# Patient Record
Sex: Female | Born: 1950
Health system: Southern US, Community
[De-identification: ages and names within clinical notes are randomized; demographics above are authoritative.]

## PROBLEM LIST (undated history)

## (undated) DIAGNOSIS — F5104 Psychophysiologic insomnia: Secondary | ICD-10-CM

## (undated) DIAGNOSIS — B029 Zoster without complications: Secondary | ICD-10-CM

## (undated) DIAGNOSIS — K219 Gastro-esophageal reflux disease without esophagitis: Secondary | ICD-10-CM

## (undated) DIAGNOSIS — I219 Acute myocardial infarction, unspecified: Secondary | ICD-10-CM

## (undated) DIAGNOSIS — E785 Hyperlipidemia, unspecified: Secondary | ICD-10-CM

## (undated) DIAGNOSIS — M8580 Other specified disorders of bone density and structure, unspecified site: Secondary | ICD-10-CM

## (undated) DIAGNOSIS — I10 Essential (primary) hypertension: Secondary | ICD-10-CM

## (undated) DIAGNOSIS — H269 Unspecified cataract: Secondary | ICD-10-CM

## (undated) DIAGNOSIS — K635 Polyp of colon: Secondary | ICD-10-CM

## (undated) DIAGNOSIS — K2100 Gastro-esophageal reflux disease with esophagitis, without bleeding: Secondary | ICD-10-CM

## (undated) DIAGNOSIS — F419 Anxiety disorder, unspecified: Secondary | ICD-10-CM

## (undated) DIAGNOSIS — T7840XA Allergy, unspecified, initial encounter: Secondary | ICD-10-CM

## (undated) DIAGNOSIS — F329 Major depressive disorder, single episode, unspecified: Secondary | ICD-10-CM

## (undated) DIAGNOSIS — R011 Cardiac murmur, unspecified: Secondary | ICD-10-CM

## (undated) DIAGNOSIS — F32A Depression, unspecified: Secondary | ICD-10-CM

## (undated) DIAGNOSIS — K611 Rectal abscess: Secondary | ICD-10-CM

## (undated) DIAGNOSIS — I2510 Atherosclerotic heart disease of native coronary artery without angina pectoris: Secondary | ICD-10-CM

## (undated) HISTORY — PX: WISDOM TOOTH EXTRACTION: SHX21

## (undated) HISTORY — DX: Acute myocardial infarction, unspecified: I21.9

## (undated) HISTORY — DX: Rectal abscess: K61.1

## (undated) HISTORY — PX: COLONOSCOPY: SHX174

## (undated) HISTORY — DX: Hyperlipidemia, unspecified: E78.5

## (undated) HISTORY — DX: Unspecified cataract: H26.9

## (undated) HISTORY — DX: Cardiac murmur, unspecified: R01.1

## (undated) HISTORY — DX: Anxiety disorder, unspecified: F41.9

## (undated) HISTORY — PX: POLYPECTOMY: SHX149

## (undated) HISTORY — DX: Depression, unspecified: F32.A

## (undated) HISTORY — DX: Polyp of colon: K63.5

## (undated) HISTORY — PX: OTHER SURGICAL HISTORY: SHX169

## (undated) HISTORY — DX: Essential (primary) hypertension: I10

## (undated) HISTORY — DX: Atherosclerotic heart disease of native coronary artery without angina pectoris: I25.10

## (undated) HISTORY — PX: INCISION AND DRAINAGE ABSCESS ANAL: SUR669

## (undated) HISTORY — DX: Major depressive disorder, single episode, unspecified: F32.9

## (undated) HISTORY — DX: Gastro-esophageal reflux disease with esophagitis: K21.0

## (undated) HISTORY — DX: Other specified disorders of bone density and structure, unspecified site: M85.80

## (undated) HISTORY — DX: Gastro-esophageal reflux disease with esophagitis, without bleeding: K21.00

## (undated) HISTORY — DX: Psychophysiologic insomnia: F51.04

## (undated) HISTORY — DX: Gastro-esophageal reflux disease without esophagitis: K21.9

## (undated) HISTORY — DX: Allergy, unspecified, initial encounter: T78.40XA

## (undated) HISTORY — PX: STOMACH SURGERY: SHX791

---

## 2004-10-03 LAB — HM COLONOSCOPY: HM Colonoscopy: ABNORMAL

## 2005-06-19 HISTORY — PX: COLONOSCOPY W/ POLYPECTOMY: SHX1380

## 2005-08-16 ENCOUNTER — Other Ambulatory Visit: Admission: RE | Admit: 2005-08-16 | Discharge: 2005-08-16 | Payer: Self-pay | Admitting: Obstetrics and Gynecology

## 2005-08-28 ENCOUNTER — Ambulatory Visit: Payer: Self-pay | Admitting: Internal Medicine

## 2005-10-06 ENCOUNTER — Ambulatory Visit: Payer: Self-pay | Admitting: Internal Medicine

## 2005-10-06 ENCOUNTER — Encounter (INDEPENDENT_AMBULATORY_CARE_PROVIDER_SITE_OTHER): Payer: Self-pay | Admitting: Specialist

## 2006-01-10 ENCOUNTER — Ambulatory Visit: Payer: Self-pay | Admitting: Internal Medicine

## 2006-02-21 ENCOUNTER — Ambulatory Visit: Payer: Self-pay | Admitting: Internal Medicine

## 2006-04-18 ENCOUNTER — Ambulatory Visit: Payer: Self-pay | Admitting: Pulmonary Disease

## 2006-09-03 LAB — CONVERTED CEMR LAB: Pap Smear: NORMAL

## 2006-09-04 ENCOUNTER — Ambulatory Visit: Payer: Self-pay | Admitting: Internal Medicine

## 2006-10-22 ENCOUNTER — Ambulatory Visit: Payer: Self-pay | Admitting: Internal Medicine

## 2007-03-11 ENCOUNTER — Ambulatory Visit: Payer: Self-pay | Admitting: Internal Medicine

## 2007-03-12 ENCOUNTER — Encounter: Payer: Self-pay | Admitting: Internal Medicine

## 2007-03-12 LAB — CONVERTED CEMR LAB
Mumps IgG: 2.1 — ABNORMAL HIGH
Rubella: 297.2 intl units/mL — ABNORMAL HIGH
Varicella IgG: 2.48 — ABNORMAL HIGH

## 2007-03-26 ENCOUNTER — Encounter: Payer: Self-pay | Admitting: *Deleted

## 2007-03-26 DIAGNOSIS — Z872 Personal history of diseases of the skin and subcutaneous tissue: Secondary | ICD-10-CM | POA: Insufficient documentation

## 2007-03-26 DIAGNOSIS — F341 Dysthymic disorder: Secondary | ICD-10-CM | POA: Insufficient documentation

## 2007-03-26 DIAGNOSIS — D126 Benign neoplasm of colon, unspecified: Secondary | ICD-10-CM | POA: Insufficient documentation

## 2007-03-26 DIAGNOSIS — I10 Essential (primary) hypertension: Secondary | ICD-10-CM | POA: Insufficient documentation

## 2007-03-26 DIAGNOSIS — G4700 Insomnia, unspecified: Secondary | ICD-10-CM | POA: Insufficient documentation

## 2007-03-27 ENCOUNTER — Ambulatory Visit: Payer: Self-pay | Admitting: Internal Medicine

## 2007-06-20 DIAGNOSIS — I219 Acute myocardial infarction, unspecified: Secondary | ICD-10-CM

## 2007-06-20 HISTORY — DX: Acute myocardial infarction, unspecified: I21.9

## 2007-07-18 ENCOUNTER — Inpatient Hospital Stay (HOSPITAL_COMMUNITY): Admission: EM | Admit: 2007-07-18 | Discharge: 2007-07-22 | Payer: Self-pay | Admitting: Cardiology

## 2007-07-18 ENCOUNTER — Encounter: Payer: Self-pay | Admitting: Internal Medicine

## 2007-07-18 ENCOUNTER — Emergency Department (HOSPITAL_COMMUNITY): Admission: EM | Admit: 2007-07-18 | Discharge: 2007-07-18 | Payer: Self-pay | Admitting: Emergency Medicine

## 2007-07-18 ENCOUNTER — Ambulatory Visit: Payer: Self-pay | Admitting: Cardiology

## 2007-07-22 ENCOUNTER — Encounter: Payer: Self-pay | Admitting: Cardiology

## 2007-07-24 ENCOUNTER — Ambulatory Visit: Payer: Self-pay | Admitting: Internal Medicine

## 2007-07-24 DIAGNOSIS — M79609 Pain in unspecified limb: Secondary | ICD-10-CM

## 2007-07-24 DIAGNOSIS — I2510 Atherosclerotic heart disease of native coronary artery without angina pectoris: Secondary | ICD-10-CM

## 2007-07-24 DIAGNOSIS — K219 Gastro-esophageal reflux disease without esophagitis: Secondary | ICD-10-CM | POA: Insufficient documentation

## 2007-07-24 DIAGNOSIS — E785 Hyperlipidemia, unspecified: Secondary | ICD-10-CM | POA: Insufficient documentation

## 2007-08-02 ENCOUNTER — Ambulatory Visit: Payer: Self-pay | Admitting: Cardiology

## 2007-08-12 ENCOUNTER — Encounter (HOSPITAL_COMMUNITY): Admission: RE | Admit: 2007-08-12 | Discharge: 2007-11-10 | Payer: Self-pay | Admitting: Cardiology

## 2007-08-22 ENCOUNTER — Ambulatory Visit: Payer: Self-pay

## 2007-08-22 ENCOUNTER — Ambulatory Visit: Payer: Self-pay | Admitting: Cardiology

## 2007-08-22 ENCOUNTER — Encounter: Payer: Self-pay | Admitting: Internal Medicine

## 2007-08-22 LAB — CONVERTED CEMR LAB
Albumin: 4.1 g/dL (ref 3.5–5.2)
Bilirubin, Direct: 0.1 mg/dL (ref 0.0–0.3)
HDL: 46.3 mg/dL (ref >39.0)
Total CHOL/HDL Ratio: 3.3
Triglycerides: 124 mg/dL (ref 0–149)
VLDL: 25 mg/dL (ref 0–40)

## 2007-08-30 ENCOUNTER — Ambulatory Visit: Payer: Self-pay | Admitting: Cardiology

## 2007-09-17 ENCOUNTER — Ambulatory Visit: Payer: Self-pay | Admitting: Internal Medicine

## 2007-10-17 ENCOUNTER — Ambulatory Visit: Payer: Self-pay | Admitting: Internal Medicine

## 2007-10-17 LAB — CONVERTED CEMR LAB
AST: 36 units/L (ref 0–37)
Alkaline Phosphatase: 97 units/L (ref 39–117)
BUN: 15 mg/dL (ref 6–23)
Basophils Relative: 0.6 % (ref 0.0–1.0)
Calcium: 8.7 mg/dL (ref 8.4–10.5)
Cholesterol: 155 mg/dL (ref 0–200)
Creatinine, Ser: 0.7 mg/dL (ref 0.4–1.2)
Eosinophils Absolute: 0.2 10*3/uL (ref 0.0–0.7)
Eosinophils Relative: 3.1 % (ref 0.0–5.0)
HCT: 40.5 % (ref 36.0–46.0)
Hgb A1c MFr Bld: 6.1 % — ABNORMAL HIGH (ref 4.6–6.0)
LDL Cholesterol: 89 mg/dL (ref 0–99)
Lymphocytes Relative: 32 % (ref 12.0–46.0)
Monocytes Absolute: 0.5 10*3/uL (ref 0.1–1.0)
Neutro Abs: 3.5 10*3/uL (ref 1.4–7.7)
Neutrophils Relative %: 56.6 % (ref 43.0–77.0)
Platelets: 277 10*3/uL (ref 150–400)
RBC: 4.51 M/uL (ref 3.87–5.11)
Sodium: 139 meq/L (ref 135–145)
Total CHOL/HDL Ratio: 4.3
Total Protein: 6.7 g/dL (ref 6.0–8.3)
Triglycerides: 149 mg/dL (ref 0–149)

## 2007-10-21 ENCOUNTER — Ambulatory Visit: Payer: Self-pay | Admitting: Internal Medicine

## 2007-10-21 ENCOUNTER — Telehealth: Payer: Self-pay | Admitting: Internal Medicine

## 2007-10-21 DIAGNOSIS — E669 Obesity, unspecified: Secondary | ICD-10-CM

## 2007-10-21 DIAGNOSIS — R7309 Other abnormal glucose: Secondary | ICD-10-CM

## 2007-10-24 ENCOUNTER — Encounter: Payer: Self-pay | Admitting: Internal Medicine

## 2007-10-30 ENCOUNTER — Telehealth: Payer: Self-pay | Admitting: Internal Medicine

## 2007-10-31 ENCOUNTER — Ambulatory Visit: Payer: Self-pay | Admitting: Cardiology

## 2007-11-11 ENCOUNTER — Encounter (HOSPITAL_COMMUNITY): Admission: RE | Admit: 2007-11-11 | Discharge: 2007-12-18 | Payer: Self-pay | Admitting: Cardiology

## 2007-11-29 ENCOUNTER — Encounter: Payer: Self-pay | Admitting: Internal Medicine

## 2008-01-13 ENCOUNTER — Telehealth: Payer: Self-pay | Admitting: Internal Medicine

## 2008-01-28 ENCOUNTER — Ambulatory Visit: Payer: Self-pay | Admitting: Internal Medicine

## 2008-01-28 DIAGNOSIS — L639 Alopecia areata, unspecified: Secondary | ICD-10-CM | POA: Insufficient documentation

## 2008-01-28 DIAGNOSIS — L659 Nonscarring hair loss, unspecified: Secondary | ICD-10-CM

## 2008-01-30 ENCOUNTER — Ambulatory Visit: Payer: Self-pay | Admitting: Cardiology

## 2008-02-03 ENCOUNTER — Ambulatory Visit: Payer: Self-pay | Admitting: Internal Medicine

## 2008-02-03 LAB — CONVERTED CEMR LAB
ALT: 18 units/L (ref 0–35)
AST: 21 units/L (ref 0–37)
CO2: 32 meq/L (ref 19–32)
Chloride: 106 meq/L (ref 96–112)
Ferritin: 29.1 ng/mL (ref 10.0–291.0)
Glucose, Bld: 99 mg/dL (ref 70–99)
Hgb A1c MFr Bld: 6 % (ref 4.6–6.0)
LDL Cholesterol: 100 mg/dL — ABNORMAL HIGH (ref 0–99)
Sodium: 142 meq/L (ref 135–145)
Thyroglobulin Ab: <30 (ref 0.0–60.0)
Total CHOL/HDL Ratio: 3.6
VLDL: 22 mg/dL (ref 0–40)

## 2008-02-04 ENCOUNTER — Encounter: Payer: Self-pay | Admitting: Internal Medicine

## 2008-02-26 ENCOUNTER — Ambulatory Visit: Payer: Self-pay | Admitting: Internal Medicine

## 2008-02-26 ENCOUNTER — Telehealth: Payer: Self-pay | Admitting: Internal Medicine

## 2008-02-28 ENCOUNTER — Telehealth: Payer: Self-pay | Admitting: Internal Medicine

## 2008-03-03 ENCOUNTER — Telehealth: Payer: Self-pay | Admitting: Internal Medicine

## 2008-03-09 ENCOUNTER — Ambulatory Visit: Payer: Self-pay | Admitting: Internal Medicine

## 2008-03-09 DIAGNOSIS — R109 Unspecified abdominal pain: Secondary | ICD-10-CM | POA: Insufficient documentation

## 2008-03-09 LAB — CONVERTED CEMR LAB
Nitrite: NEGATIVE
Urobilinogen, UA: 0.2
pH: 5

## 2008-05-21 ENCOUNTER — Ambulatory Visit: Payer: Self-pay

## 2008-05-21 ENCOUNTER — Ambulatory Visit: Payer: Self-pay | Admitting: Cardiology

## 2008-07-07 ENCOUNTER — Ambulatory Visit: Payer: Self-pay | Admitting: Diagnostic Radiology

## 2008-07-07 ENCOUNTER — Ambulatory Visit: Payer: Self-pay | Admitting: Internal Medicine

## 2008-07-07 ENCOUNTER — Ambulatory Visit (HOSPITAL_BASED_OUTPATIENT_CLINIC_OR_DEPARTMENT_OTHER): Admission: RE | Admit: 2008-07-07 | Discharge: 2008-07-07 | Payer: Self-pay | Admitting: Internal Medicine

## 2008-07-07 DIAGNOSIS — M25539 Pain in unspecified wrist: Secondary | ICD-10-CM

## 2008-07-17 ENCOUNTER — Encounter: Payer: Self-pay | Admitting: Internal Medicine

## 2008-11-05 ENCOUNTER — Ambulatory Visit: Payer: Self-pay | Admitting: Cardiology

## 2009-01-12 ENCOUNTER — Ambulatory Visit: Payer: Self-pay | Admitting: Internal Medicine

## 2009-01-12 DIAGNOSIS — N390 Urinary tract infection, site not specified: Secondary | ICD-10-CM

## 2009-01-12 LAB — CONVERTED CEMR LAB
Glucose, Urine, Semiquant: NEGATIVE
Urobilinogen, UA: 0.2
pH: 5

## 2009-01-13 ENCOUNTER — Encounter: Payer: Self-pay | Admitting: Internal Medicine

## 2009-01-13 ENCOUNTER — Telehealth: Payer: Self-pay | Admitting: Internal Medicine

## 2009-01-19 ENCOUNTER — Ambulatory Visit: Payer: Self-pay | Admitting: Internal Medicine

## 2009-01-19 ENCOUNTER — Telehealth: Payer: Self-pay | Admitting: Internal Medicine

## 2009-01-19 ENCOUNTER — Ambulatory Visit (HOSPITAL_BASED_OUTPATIENT_CLINIC_OR_DEPARTMENT_OTHER): Admission: RE | Admit: 2009-01-19 | Discharge: 2009-01-19 | Payer: Self-pay | Admitting: Internal Medicine

## 2009-01-19 ENCOUNTER — Ambulatory Visit: Payer: Self-pay | Admitting: Diagnostic Radiology

## 2009-01-19 DIAGNOSIS — M549 Dorsalgia, unspecified: Secondary | ICD-10-CM | POA: Insufficient documentation

## 2009-01-19 LAB — CONVERTED CEMR LAB
Bilirubin Urine: NEGATIVE
Specific Gravity, Urine: >=1.03
Urobilinogen, UA: 0.2

## 2009-02-10 LAB — CONVERTED CEMR LAB
ALT: 18 units/L (ref 0–35)
Albumin: 4.6 g/dL (ref 3.5–5.2)
Bilirubin, Direct: 0.1 mg/dL (ref 0.0–0.3)
CO2: 25 meq/L (ref 19–32)
Chloride: 105 meq/L (ref 96–112)
Glucose, Bld: 90 mg/dL (ref 70–99)
HDL: 52 mg/dL (ref >39)
LDL Cholesterol: 79 mg/dL (ref 0–99)
Lymphocytes Relative: 40 % (ref 12–46)
MCHC: 31.3 g/dL (ref 30.0–36.0)
MCV: 91.6 fL (ref 78.0–100.0)
Monocytes Absolute: 0.6 10*3/uL (ref 0.1–1.0)
Neutro Abs: 3.5 10*3/uL (ref 1.7–7.7)
Neutrophils Relative %: 49 % (ref 43–77)
Platelets: 281 10*3/uL (ref 150–400)
RDW: 13.8 % (ref 11.5–15.5)
Sodium: 143 meq/L (ref 135–145)
Total Bilirubin: 0.6 mg/dL (ref 0.3–1.2)
Triglycerides: 183 mg/dL — ABNORMAL HIGH (ref <150)
WBC: 7.1 10*3/uL (ref 4.0–10.5)

## 2009-07-08 ENCOUNTER — Ambulatory Visit: Payer: Self-pay | Admitting: Cardiology

## 2009-08-08 ENCOUNTER — Emergency Department (HOSPITAL_COMMUNITY): Admission: EM | Admit: 2009-08-08 | Discharge: 2009-08-08 | Payer: Self-pay | Admitting: Emergency Medicine

## 2009-08-11 ENCOUNTER — Telehealth: Payer: Self-pay | Admitting: Cardiology

## 2010-01-25 ENCOUNTER — Ambulatory Visit: Payer: Self-pay | Admitting: Family

## 2010-01-25 DIAGNOSIS — J069 Acute upper respiratory infection, unspecified: Secondary | ICD-10-CM | POA: Insufficient documentation

## 2010-02-04 ENCOUNTER — Ambulatory Visit: Payer: Self-pay | Admitting: Cardiology

## 2010-02-17 LAB — CONVERTED CEMR LAB
AST: 27 units/L (ref 0–37)
Alkaline Phosphatase: 90 units/L (ref 39–117)
Bilirubin, Direct: 0.1 mg/dL (ref 0.0–0.3)
LDL Cholesterol: 88 mg/dL (ref 0–99)
Total Bilirubin: 0.8 mg/dL (ref 0.3–1.2)
Total CHOL/HDL Ratio: 4
VLDL: 33 mg/dL (ref 0.0–40.0)

## 2010-07-19 NOTE — Assessment & Plan Note (Signed)
Summary: f11m   Visit Type:  6 months follow up Primary Provider:  Dondra Spry DO  CC:  No complains.  History of Present Illness: She is doing pretty well.  Her husband is home from [place] permanently.  She is now in school.  Her husband was forcibly retired.  Patient is currently taking alot of supplements.  She feels quite healthy.    Current Medications (verified): 1)  Tylenol Pm Extra Strength 500-25 Mg  Tabs (Diphenhydramine-Apap (Sleep)) .... Take As Needed For Sleep 2)  Xanax 0.25 Mg  Tabs (Alprazolam) .... Take 1 Tablet By Mouth Once A Day As Needed 3)  Lipitor 80 Mg  Tabs (Atorvastatin Calcium) .... Take 1 Tablet By Mouth Once A Day At Bedtime 4)  Aspirin Low Dose 81 Mg Tabs (Aspirin) .... Take 1 Tablet By Mouth Two Times A Day 5)  Plavix 75 Mg  Tabs (Clopidogrel Bisulfate) .... Take 1 Tablet By Mouth Once A Day 6)  Metoprolol Tartrate 25 Mg  Tabs (Metoprolol Tartrate) .... 1/2 Two Times A Day 7)  Nitroquick 0.4 Mg  Subl (Nitroglycerin) .... As Needed Chest Pain 8)  Calcium-Magnesium-Zinc 500-250-12.5 Mg Tabs (Calcium-Magnesium-Zinc) .... Take 1 Tablet By Mouth Every Other Day 9)  Sm Flax Seed Oil 1000 Mg Caps (Flaxseed (Linseed)) .... 1200mg  Per Day 10)  Fish Oil 1200 Mg Caps (Omega-3 Fatty Acids) .... Take 1 Capsule By Mouth Once A Day 11)  Coq10 200 Mg Caps (Coenzyme Q10) .... Take 1 Capsule By Mouth Once A Day 12)  Biotin 300 Mcg Tabs (Biotin) .... 600mg  Every Other Day 13)  Cinnamon 500 Mg Caps (Cinnamon) .... 1000mg  Per Day 14)  Garlic Oil 500 Mg Tabs (Garlic) .... 1250mg  Per Day 15)  Cramberryjuice Concentrate .... 500mg  Per Day 16)  Super B Complex  Tabs (B Complex-C) .... Take 1 Tablet By Mouth Once A Day 17)  Green Tea 315 Mg Caps (Green Tea (Camillia Sinensis)) .... Take 1 Capsule By Mouth Once A Day 18)  Rhodiola 300 Mg Caps (Rhodiola Rosea) .... 340mg  Per Day 19)  Chromium Picolinate 200 Mcg Tabs (Chromium Picolinate) .... Every Other Day 20)   Calcium-Magnesium-Zinc 750-375-15 Mg Tabs (Calcium-Magnesium-Zinc) .... Every Other Day 21)  Acetyl L-Carnitine 250 Mg Caps (Acetylcarnitine Hcl) .... 400mg  Every Other Day  Allergies: 1)  ! Sulfa 2)  ! Phenergan 3)  ! Codeine 4)  ! Penicillin 5)  ! Hydrocodone 6)  ! Compazine 7)  ! Thorazine 8)  ! Vicodin  Past History:  Past Medical History: Last updated: 01/19/2009 Chronic insomnia History of perirectal abscess History of colon polyp  Anxiety/Depression Hypertension  CAD   Acute MI  -  1/09 Preserved LV function  Hyperlipidemia    Past Surgical History: Last updated: 01/19/2009 CAESAREAN SECTION, HX OF (ICD-V39.01) S/P PTCA with stent to third obtuse marginal       Family History: Last updated: 01/19/2009 Father allergies Mother skin cancer    Social History: Last updated: 01/19/2009 Occupation: student 2 children  Married Former Smoker quit 6-8 months ago (40 pack year history)   SOCIAL HISTORY:  She resides in [place] with her husband.  However,   her husband works in [place] and comes home two to three times a year.   She has two children who are going to school at [employer].  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  She quit smoking in   early 2008.  Prior to that, she was  smoking two packs a day x20 years.   She denies any alcohol, drugs, herbal medications, specific diet or   exercise.        Vital Signs:  Patient profile:   60 year old female Height:      61 inches Weight:      158.50 pounds BMI:     30.06 Pulse rate:   58 / minute Pulse rhythm:   regular Resp:     18 per minute BP sitting:   110 / 80  (left arm) Cuff size:   large  Vitals Entered By: Vikki Ports (February 04, 2010 10:21 AM)  Physical Exam  General:  Well developed, well nourished, in no acute distress. Head:  normocephalic and atraumatic Eyes:  PERRLA/EOM intact; conjunctiva and lids normal. Lungs:  Clear bilaterally to auscultation and  percussion. Heart:  Non-displaced PMI, chest non-tender; regular rate and rhythm, S1, S2 without murmurs, rubs or gallops. Carotid upstroke normal, no bruit.  Pulses:  pulses normal in all 4 extremities Extremities:  No clubbing or cyanosis.  Trace edema. Neurologic:  Alert and oriented x 3.   EKG  Procedure date:  02/04/2010  Findings:      SB.  Otherwise normal.    Impression & Recommendations:  Problem # 1:  C A D (ICD-414.00) Stable at present.  No chest pain.  Husband has moved home, and increased stress.  I suggested that they exercise together.  Follow up one year.   Her updated medication list for this problem includes:    Aspirin Low Dose 81 Mg Tabs (Aspirin) .Marland Kitchen... Take 1 tablet by mouth two times a day    Plavix 75 Mg Tabs (Clopidogrel bisulfate) .Marland Kitchen... Take 1 tablet by mouth once a day    Metoprolol Tartrate 25 Mg Tabs (Metoprolol tartrate) .Marland Kitchen... 1/2 two times a day    Nitroquick 0.4 Mg Subl (Nitroglycerin) .Marland Kitchen... As needed chest pain  Her updated medication list for this problem includes:    Aspirin Low Dose 81 Mg Tabs (Aspirin) .Marland Kitchen... Take 1 tablet by mouth two times a day    Plavix 75 Mg Tabs (Clopidogrel bisulfate) .Marland Kitchen... Take 1 tablet by mouth once a day    Metoprolol Tartrate 25 Mg Tabs (Metoprolol tartrate) .Marland Kitchen... 1/2 two times a day    Nitroquick 0.4 Mg Subl (Nitroglycerin) .Marland Kitchen... As needed chest pain  Orders: EKG w/ Interpretation (93000) TLB-Lipid Panel (80061-LIPID) TLB-Hepatic/Liver Function Pnl (80076-HEPATIC)  Problem # 2:  HYPERLIPIDEMIA (ICD-272.4)  Need to check lipid and liver profile.   Her updated medication list for this problem includes:    Lipitor 80 Mg Tabs (Atorvastatin calcium) .Marland Kitchen... Take 1 tablet by mouth once a day at bedtime  Orders: EKG w/ Interpretation (93000) TLB-Lipid Panel (80061-LIPID) TLB-Hepatic/Liver Function Pnl (80076-HEPATIC)  Her updated medication list for this problem includes:    Lipitor 80 Mg Tabs (Atorvastatin  calcium) .Marland Kitchen... Take 1 tablet by mouth once a day at bedtime  Problem # 3:  Hx of HYPERTENSION, MILD (ICD-401.1) stable.   Her updated medication list for this problem includes:    Aspirin Low Dose 81 Mg Tabs (Aspirin) .Marland Kitchen... Take 1 tablet by mouth two times a day    Metoprolol Tartrate 25 Mg Tabs (Metoprolol tartrate) .Marland Kitchen... 1/2 two times a day  Patient Instructions: 1)  Your physician recommends that you have a FASTING LIPID and LIVER Profile.  2)  Your physician recommends that you continue on your current medications as directed. Please refer to the Current  Medication list given to you today. 3)  Your physician wants you to follow-up in: 1 YEAR.   You will receive a reminder letter in the mail two months in advance. If you don't receive a letter, please call our office to schedule the follow-up appointment. Prescriptions: METOPROLOL TARTRATE 25 MG  TABS (METOPROLOL TARTRATE) 1/2 two times a day  #90 x 3   Entered by:   Julieta Gutting, RN, BSN   Authorized by:   Ronaldo Miyamoto, MD, [facility]   Signed by:   Julieta Gutting, RN, BSN on 02/04/2010   Method used:   Electronically to        [facility] [address] [phone]* (retail)       [address]       [place], [place]  [address]       Ph: [phone] or [phone]       Fax: [phone]   RxID:   [id number] PLAVIX 75 MG  TABS (CLOPIDOGREL BISULFATE) Take 1 tablet by mouth once a day  #90 x 3   Entered by:   Julieta Gutting, RN, BSN   Authorized by:   Ronaldo Miyamoto, MD, [facility]   Signed by:   Julieta Gutting, RN, BSN on 02/04/2010   Method used:   Electronically to        [facility] [address] [phone]* (retail)       [address]       [place], [place]  [address]       Ph: [phone] or [phone]       Fax: [phone]   RxID:   [id number] LIPITOR 80 MG  TABS (ATORVASTATIN CALCIUM) Take 1 tablet by mouth once a day at bedtime  #90 x 3   Entered by:   Julieta Gutting, RN,  BSN   Authorized by:   Ronaldo Miyamoto, MD, [facility]   Signed by:   Julieta Gutting, RN, BSN on 02/04/2010   Method used:   Electronically to        [facility] [address] [phone]* (retail)       [address]       [place], [place]  [address]       Ph: [phone] or [phone]       Fax: [phone]   RxID:   [id number]

## 2010-07-19 NOTE — Assessment & Plan Note (Signed)
Summary: Emily [name]   Visit Type:  6 months follow up Primary Provider:  Dondra Spry DO  CC:  No complains.  History of Present Illness: Pt is still a sophomore at college at [employer].  Her major interest is in piano.  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  She is enjoying it.  She gets tudoring.  She has had not had any chest pain, and feels really quite heallthy.  She had a case of vertigo, and a couple of UTI's.  Had recommended d/c of clopidogrel last year, but she wanted to continue, and is still nervous about coming off.  Results of the Charisma trial were discussed with the patient.  She wants to continue given her residual disease, and after discussion about subgroup analysis.  Now taking alot of vitamins. f Current Medications (verified): 1)  Tylenol Pm Extra Strength 500-25 Mg  Tabs (Diphenhydramine-Apap (Sleep)) .... Take As Needed For Sleep 2)  Xanax 0.25 Mg  Tabs (Alprazolam) .... Take 1 Tablet By Mouth Once A Day As Needed 3)  Lipitor 80 Mg  Tabs (Atorvastatin Calcium) .... Take 1 Tablet By Mouth Once A Day At Bedtime 4)  Aspirin Low Dose 81 Mg Tabs (Aspirin) .... Take 1 Tablet By Mouth Two Times A Day 5)  Plavix 75 Mg  Tabs (Clopidogrel Bisulfate) .... Take 1 Tablet By Mouth Once A Day 6)  Metoprolol Tartrate 25 Mg  Tabs (Metoprolol Tartrate) .... 1/2 Two Times A Day 7)  Nitroquick 0.4 Mg  Subl (Nitroglycerin) .... As Needed Chest Pain 8)  Calcium-Magnesium-Zinc 500-250-12.5 Mg Tabs (Calcium-Magnesium-Zinc) .... Take 1 Tablet By Mouth Once A Day 9)  Sm Flax Seed Oil 1000 Mg Caps (Flaxseed (Linseed)) .... Take 1 Capsule By Mouth Once A Day 10)  Fish Oil 1200 Mg Caps (Omega-3 Fatty Acids) .... Take 1 Capsule By Mouth Once A Day 11)  Coq10 200 Mg Caps (Coenzyme Q10) .... Take 1 Capsule By Mouth Once A Day 12)  Cvs Green Tea Extract 250 Mg Caps (Green Tea (Camillia Sinensis)) .... Once Daily 13)  Biotin 300 Mcg Tabs (Biotin) .... Take 1 Tablet By Mouth Once A Day 14)  Rhodiola 300 Mg Caps  (Rhodiola Rosea) .... Take 1 Capsule By Mouth Once A Day 15)  Cinnamon 500 Mg Caps (Cinnamon) .... Take 1 Capsule By Mouth Once A Day 16)  B-50 Formula  Tabs (Multiple Vitamins-Minerals) .... Take 1 Tablet By Mouth Once A Day 17)  Osteo Bi-Flex Adv Double St  Tabs (Misc Natural Products) .... Take 1 Tablet By Mouth Once A Day 18)  Mulberry Tablet .... Take 1 Tablet By Mouth Once A Day 19)  Garlic Oil 500 Mg Tabs (Garlic) .... Take 1 Tablet By Mouth Once A Day  Allergies: 1)  ! Sulfa 2)  ! Phenergan 3)  ! Codeine 4)  ! Penicillin 5)  ! Hydrocodone 6)  ! Compazine 7)  ! Thorazine 8)  ! Vicodin  Vital Signs:  Patient profile:   60 year old female Height:      61 inches Weight:      152.75 pounds BMI:     28.97 Pulse rate:   81 / minute Pulse rhythm:   regular Resp:     18 per minute BP sitting:   128 / 82  (left arm) Cuff size:   regular  Vitals Entered By: Vikki Ports (July 08, 2009 2:27 PM)  Physical Exam  General:  Well developed, well nourished, in no acute distress.  Lungs:  Clear bilaterally to auscultation and percussion. Heart:  Non-displaced PMI, chest non-tender; regular rate and rhythm, S1, S2 without murmurs, rubs or gallops. Extremities:  No clubbing or cyanosis. Neurologic:  Alert and oriented x 3.   EKG  Procedure date:  07/08/2009  Findings:      Normal rhythm.  WNL.  Impression & Recommendations:  Problem # 1:  C A D (ICD-414.00)  Stable at present.  Continuing medications.  Wants to continue clopidogrel.  No bleeding. Charisma discussed.  updated medication list for this problem includes:    Aspirin Low Dose 81 Mg Tabs (Aspirin) .Marland Kitchen... Take 1 tablet by mouth two times a day    Plavix 75 Mg Tabs (Clopidogrel bisulfate) .Marland Kitchen... Take 1 tablet by mouth once a day    Metoprolol Tartrate 25 Mg Tabs (Metoprolol tartrate) .Marland Kitchen... 1/2 two times a day    Nitroquick 0.4 Mg Subl (Nitroglycerin) .Marland Kitchen... As needed chest pain  Orders: EKG w/ Interpretation  (93000)  Problem # 2:  Hx of HYPERTENSION, MILD (ICD-401.1) stable. Her updated medication list for this problem includes:    Aspirin Low Dose 81 Mg Tabs (Aspirin) .Marland Kitchen... Take 1 tablet by mouth two times a day    Metoprolol Tartrate 25 Mg Tabs (Metoprolol tartrate) .Marland Kitchen... 1/2 two times a day  Problem # 3:  HYPERLIPIDEMIA (ICD-272.4)  Numbers ok.  Continue medication.  Recheck numbers. Her updated medication list for this problem includes:    Lipitor 80 Mg Tabs (Atorvastatin calcium) .Marland Kitchen... Take 1 tablet by mouth once a day at bedtime  Orders: EKG w/ Interpretation (93000)  Patient Instructions: 1)  Your physician recommends that you return for a FASTING LIPID and LIVER Profile (414.01, 272.0, v58.69)  2)  Your physician wants you to follow-up in:   6 MONTHS. You will receive a reminder letter in the mail two months in advance. If you don't receive a letter, please call our office to schedule the follow-up appointment. 3)  Your physician recommends that you continue on your current medications as directed. Please refer to the Current Medication list given to you today.

## 2010-07-19 NOTE — Progress Notes (Signed)
Summary: PT WAS IN ED WITH CHEST PAIN  Phone Note Call from Patient Call back at Home Phone [phone]   Caller: Patient Reason for Call: Talk to Nurse, Talk to Doctor Summary of Call: PT WAS IN ER SUNDAY WITH CHEST PAIN AND EVERYTHING WAS NORMAL EXCEPT B/P WAS REALLY HIGH PT HAS ONLY ABOUT 45 MIN PLEASE GIVE HER A CALL. 6:30 this am 146/92, 11:30 today 158/98 took meds at 7:30  Initial call taken by: Omer Jack,  August 11, 2009 11:29 AM  Follow-up for Phone Call        I spoke with the pt and she has not had any further CP since Sunday.  I reviewed the pt's ER records and everything looked okay (labs, x-ray).  The pt felt like she got over anxious about the pain which she rated as 1/10 in the ER.  The pt's BP in the ER was 150/83 and 150/82.  Today the pt checked her BP at 6:30 and it was 146/92.  The pt took her medications at  7:30.  The pt just came home from school and her pressure at  11:30 was 158/98.  The pt said she has been under a lot of stress at school and has been anxious.  I told the pt that these factors can effect BP.  I asked the pt to check her BP a few times a week and call the office back in 10 days if her BP is consistently above 140/90.  Pt agrees with plan.   Follow-up by: Julieta Gutting, RN, BSN,  August 11, 2009 12:19 PM     Appended Document: PT WAS IN ED WITH CHEST PAIN Reviewed with L. Manson Passey.  Agree with plan.

## 2010-07-19 NOTE — Assessment & Plan Note (Signed)
Summary: SINUS PROBLEM/ST/HEA--Rm 4   Vital Signs:  Patient profile:   60 year old female Height:      61 inches Weight:      158 pounds BMI:     29.96 O2 Sat:      97 % on Room air Temp:     98.1 degrees F oral Pulse rate:   78 / minute Pulse rhythm:   regular Resp:     18 per minute BP sitting:   112 / 74  (right arm) Cuff size:   regular  Vitals Entered By: Emily [name] CMA Duncan Dull) (January 25, 2010 11:05 AM)  O2 Flow:  Room air CC: Room 4  Sore throat, hoarse, dry cough & low grade fever x 1 day. Is Patient Diabetic? No Comments Pt states she only takes Biotin every other day. All other med doses and directions are correct.  Emily [name] CMA Duncan Dull)  January 25, 2010 11:10 AM    Primary Care Provider:  Dondra Spry DO  CC:  Room 4  Sore throat, hoarse, and dry cough & low grade fever x 1 day.Marland Kitchen  History of Present Illness: Emily [name] is a 60 year old female who presents with complaint of AM sore throat.  Gets better after she has been up for a little while.  Notes husband has had a cough for 3 weeks.  Notes associated sore throat, low grade fever, voice hoarseness, cough, and  nasal congestion.   Mild nasal congestion- no significant drainage.  Cough is dry.  She expresses concern that "I always end up with Bronchitis."  She also reports that her husband has recently moved back from [place] where he has lived for the last 5 years.  He recently retired as well.  They are adjusting to living together again which pt reports is very challenging.  She is requesting a referral to a local therapist who provides marriage counseling.  Allergies: 1)  ! Sulfa 2)  ! Phenergan 3)  ! Codeine 4)  ! Penicillin 5)  ! Hydrocodone 6)  ! Compazine 7)  ! Thorazine 8)  ! Vicodin  Past History:  Past Medical History: Last updated: 01/19/2009 Chronic insomnia History of perirectal abscess History of colon polyp  Anxiety/Depression Hypertension  CAD   Acute MI  -   1/09 Preserved LV function  Hyperlipidemia    Past Surgical History: Last updated: 01/19/2009 CAESAREAN SECTION, HX OF (ICD-V39.01) S/P PTCA with stent to third obtuse marginal       Family History: Last updated: 01/19/2009 Father allergies Mother skin cancer    Social History: Last updated: 01/19/2009 Occupation: student 2 children  Married Former Smoker quit 6-8 months ago (40 pack year history)   SOCIAL HISTORY:  She resides in [place] with her husband.  However,   her husband works in [place] and comes home two to three times a year.   She has two children who are going to school at [employer].  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  She quit smoking in   early 2008.  Prior to that, she was smoking two packs a day x20 years.   She denies any alcohol, drugs, herbal medications, specific diet or   exercise.        Risk Factors: Smoking Status: quit (07/24/2007)  Physical Exam  General:  Well-developed,well-nourished,in no acute distress; alert,appropriate and cooperative throughout examination Head:  Normocephalic and atraumatic without obvious abnormalities. No apparent alopecia  or balding. Eyes:  PERRLA Ears:  External ear exam shows no significant lesions or deformities.  Otoscopic examination reveals clear canals, tympanic membranes are intact bilaterally without bulging, retraction, inflammation or discharge. Hearing is grossly normal bilaterally. Mouth:  + pharyngeal erythema noted Neck:  No deformities, masses, or tenderness noted. Lungs:  Normal respiratory effort, chest expands symmetrically. Lungs are clear to auscultation, no crackles or wheezes. Heart:  Normal rate and regular rhythm. S1 and S2 normal without gallop, murmur, click, rub or other extra sounds.   Impression & Recommendations:  Problem # 1:  URI (ICD-465.9) Assessment New Rapid strep is negative.  Will add Zyrtec as needed to help with any allergic component.  Pt was  given rx for empiric zithromax given reported history of recurrent bronchitis.  She was instructed to call if symptoms worsen or do not improve. Her updated medication list for this problem includes:    Aspirin Low Dose 81 Mg Tabs (Aspirin) .Marland Kitchen... Take 1 tablet by mouth two times a day  Problem # 2:  OTHER FAMILY DISRUPTION (ICD-V61.09) Assessment: New Will refer to marriage counseling.  Orders: Psychology Referral (Psychology)  Complete Medication List: 1)  Tylenol Pm Extra Strength 500-25 Mg Tabs (Diphenhydramine-apap (sleep)) .... Take as needed for sleep 2)  Xanax 0.25 Mg Tabs (Alprazolam) .... Take 1 tablet by mouth once a day as needed 3)  Lipitor 80 Mg Tabs (Atorvastatin calcium) .... Take 1 tablet by mouth once a day at bedtime 4)  Aspirin Low Dose 81 Mg Tabs (Aspirin) .... Take 1 tablet by mouth two times a day 5)  Plavix 75 Mg Tabs (Clopidogrel bisulfate) .... Take 1 tablet by mouth once a day 6)  Metoprolol Tartrate 25 Mg Tabs (Metoprolol tartrate) .... 1/2 two times a day 7)  Nitroquick 0.4 Mg Subl (Nitroglycerin) .... As needed chest pain 8)  Calcium-magnesium-zinc 500-250-12.5 Mg Tabs (Calcium-magnesium-zinc) .... Take 1 tablet by mouth once a day 9)  Sm Flax Seed Oil 1000 Mg Caps (Flaxseed (linseed)) .... Take 1 capsule by mouth once a day 10)  Fish Oil 1200 Mg Caps (Omega-3 fatty acids) .... Take 1 capsule by mouth once a day 11)  Coq10 200 Mg Caps (Coenzyme q10) .... Take 1 capsule by mouth once a day 12)  Cvs Green Tea Extract 250 Mg Caps (Green tea (camillia sinensis)) .... Once daily 13)  Biotin 300 Mcg Tabs (Biotin) .... Take 1 tablet by mouth once every other day 14)  Rhodiola 300 Mg Caps (Rhodiola rosea) .... Take 1 capsule by mouth once a day 15)  Cinnamon 500 Mg Caps (Cinnamon) .... Take 1 capsule by mouth once a day 16)  B-50 Formula Tabs (Multiple vitamins-minerals) .... Take 1 tablet by mouth once a day 17)  Osteo Bi-flex Adv Double St Tabs (Misc natural  products) .... Take 1 tablet by mouth once a day 18)  Mulberry Tablet  .... Take 1 tablet by mouth once a day 19)  Garlic Oil 500 Mg Tabs (Garlic) .... Take 1 tablet by mouth once a day 20)  Zithromax 250 Mg Tabs (Azithromycin) .... 2 tabs by mouth today, then one tablet by mouth daily x 4 more days  Other Orders: Rapid Strep ([address])  Patient Instructions: 1)  Try adding zyrtec 10 mg by mouth once daily. Prescriptions: ZITHROMAX 250 MG TABS (AZITHROMYCIN) 2 tabs by mouth today, then one tablet by mouth daily x 4 more days  #1 pack x 0   Entered and Authorized by:  Lemont Fillers FNP   Signed by:   Lemont Fillers FNP on 01/25/2010   Method used:   Electronically to        [facility] [phone]* (retail)       [address]       [place], [place]  [address]       Ph: [phone] or [phone]       Fax: [phone]   RxID:   [id number]   Current Allergies (reviewed today): ! SULFA ! PHENERGAN ! CODEINE ! PENICILLIN ! HYDROCODONE ! COMPAZINE ! THORAZINE ! VICODIN  Laboratory Results  Date/Time Received: 01/25/10 Date/Time Reported: Emily [name] CMA (AAMA)  January 25, 2010 11:25 AM   Other Tests  Rapid Strep: negative  Kit Test Internal QC: Positive   (Normal Range: Negative)

## 2010-09-01 ENCOUNTER — Telehealth: Payer: Self-pay | Admitting: Internal Medicine

## 2010-09-01 DIAGNOSIS — H9190 Unspecified hearing loss, unspecified ear: Secondary | ICD-10-CM | POA: Insufficient documentation

## 2010-09-07 LAB — CK TOTAL AND CKMB (NOT AT ARMC)
Relative Index: INVALID (ref 0.0–2.5)
Total CK: 81 U/L (ref 7–177)

## 2010-09-07 LAB — TROPONIN I: Troponin I: <0.01 ng/mL (ref 0.00–0.06)

## 2010-09-07 LAB — BASIC METABOLIC PANEL
BUN: 16 mg/dL (ref 6–23)
CO2: 30 mEq/L (ref 19–32)
Calcium: 9.3 mg/dL (ref 8.4–10.5)
Chloride: 108 mEq/L (ref 96–112)
Creatinine, Ser: 0.71 mg/dL (ref 0.4–1.2)
GFR calc non Af Amer: >60 mL/min (ref >60)

## 2010-09-07 LAB — CBC
RDW: 13.8 % (ref 11.5–15.5)
WBC: 7.9 10*3/uL (ref 4.0–10.5)

## 2010-09-15 NOTE — Progress Notes (Signed)
Summary: referral for additional hearing test  Phone Note Call from Patient Call back at Home Phone [phone]   Caller: Patient Call For: [name] Summary of Call: Pt had hearing test at [facility]. Results were moderate to severe hearing loss. Pt would like you to refer her to doctor for additional testing. please advise. Initial call taken by: Elba Barman,  September 01, 2010 10:49 AM  Follow-up for Phone Call        Appt  Dr Dorma Russell   March  21  @  2:40pm  Follow-up by: Darral Dash,  September 06, 2010 2:34 PM  New Problems: UNSPECIFIED HEARING LOSS (ICD-389.9)   New Problems: UNSPECIFIED HEARING LOSS (ICD-389.9)

## 2010-09-30 ENCOUNTER — Encounter: Payer: Self-pay | Admitting: Internal Medicine

## 2010-11-01 NOTE — Assessment & Plan Note (Signed)
[facility]ALTHC[facility]                            CARDIOLOGY OFFICE NOTE   NAME:Emily [name]                 MRN:          [id number]  DATE:08/02/2007                            DOB:          1950/12/25    [name] is in for a follow-up office visit.  In general, she is  getting along reasonably well.  Her husband has returned from [place], but  is getting ready to leave.  She has a desire to return to schooling  although she is concerned that she may need to drop out of a piano  class.  I think she probably will do well with this, however.  She  presented to [facility] emergency room with some ongoing chest  discomfort.  She has stopped smoking previously.  At the  catheterization, she had well-preserved LV function with an ejection  fraction in excess of 55%.  The circumflex was a dominant vessel.  There  was a first marginal had about 50% ostial segment narrowing.  Second  marginal came out at a slightly different angle but also had 40%  narrowing.  There was an occluded third marginal with a ruptured plaque.  This ended just prior to the bifurcation distally.  This was felt be the  cause of her problems.  She underwent successful stenting using 2.5 x 18  Multilink Vision stent with post dilatation of 2.75.  During the  hospitalization, her renal function and hematocrit were normal.  Her  peak CPKs were beginning with CK total of 146, and MB of 189.  Her lipid  profile during admission revealed an LDL of 147.  Her TSH at this time  was completely normal.  She had some lower extremity trace edema and the  Dopplers at that time were negative.  Chest x-ray was unremarkable.  As  mentioned since discharge from the hospital she has gotten along  reasonably well.   Her drug allergies include SULFA, PHENERGAN, PENICILLIN, CODEINE, and  VICODIN.   Her medications include aspirin 3/25 mg daily, Plavix 75 mg daily,  Lipitor 80 mg q.h.s. and Lopressor 50  mg 1/2 tablet b.i.d.   PHYSICAL EXAMINATION:  Today she is alert and oriented and in no  distress.  The pulse 59 the blood pressure 128/76 and weight is 146 pounds.  Her lung fields are clear to auscultation and percussion.  Her cardiac rhythm is regular.  EXTREMITIES:  Reveal no edema.   Electrocardiogram demonstrates normal sinus rhythm.  There is minor  nonspecific T-wave abnormality with flattened T-waves in the inferior  leads and lateral leads, particularly.   IMPRESSION:  1. Coronary artery disease status post acute myocardial infarction due      to ruptured plaque in the distal circumflex with successful      percutaneous stenting using a non drug-eluting stent.  2. Hypercholesterolemia.  3. Former smoker.  4. Moderate anxiety about the above.   RECOMMENDATIONS:  1. I would recommend cardiac rehabilitation as I think it would be      helpful for her confidence level.  2. I would continue her on the current medical regimen.  She has been      instructed to stay on aspirin and Plavix for minimum of 1 month,      and preferably we would treat her for 1 year.  After 6 months her      aspirin can be reduced to 81 mg.  Refraining from smoking would      also be important.  3. She will be lipid and liver profile.  4. She will need follow-up with Dr. Thomos Lemons.     Arturo Morton. [name], MD, [facility]  Electronically Signed    TDS/MedQ  DD: 08/05/2007  DT: 08/05/2007  Job #: [id number]

## 2010-11-01 NOTE — H&P (Signed)
[name], [name]          ACCOUNT NO.:  [id number]   MEDICAL RECORD NO.:  [id number]          PATIENT TYPE:  INP   LOCATION:  2901                         FACILITY:  [facility]   PHYSICIAN:  Arturo Morton. [name], MD, FACCDATE OF BIRTH:  [birth year]   DATE OF ADMISSION:  07/18/2007  DATE OF DISCHARGE:                              HISTORY & PHYSICAL   PRIMARY CARE PHYSICIAN:  Barbette Hair. Artist Pais, DO.  Cardiologist, Dr. [name].   HISTORY OF PRESENT ILLNESS:  Ms. [name] is a 60 year old, white  female who presented to [facility] complaining of chest discomfort.  She was transferred emergently via [facility] directly to the  catheterization lab secondary to acute myocardial infarction.  Since 10  p.m. last night, the patient has noticed an anterior chest heaviness  radiating into her back.  She denied associated shortness of breath,  vomiting or diaphoresis.  She did have some slight nausea.  She gave it  a 6/10.  She did try to take some Tylenol PM without relief.  She went  to bed around 11 p.m. or 11:30 p.m. and spent a restless night with her  discomfort at about a 3/10.  Since this morning, due to her continued  discomfort, she attributed it to a severe anxiety attack and she called  her primary care physician.  Given her chest discomfort, she was  referred to [facility] Emergency Room.  On arrival to [facility]  Emergency Room, her blood pressure was 170/80, pulse 68, respirations  18, 99% saturations on room air.  Her EKG showed inferior Q waves, but  inferior ST-segment elevation.  She was transferred emergently to the  catheterization lab.   ALLERGIES:  SULFA, PENICILLIN, PHENERGAN, CODEINE, HYDROCODONE,  COMPAZINE AND THORAZINE.  She states that anything that his given for  nausea, she tends not to react well.   MEDICATIONS:  She is not on any prescription medications, however, she  does have a Nicoderm patch on, unknown dosage.   PAST MEDICAL HISTORY:  1. Insomnia and  anxiety for which she is followed by Dr. Shelle Iron.  2. History of hypertension and hyperlipidemia, however, these have      never been treated.   PAST SURGICAL HISTORY:  1. C-section in 1987, and 1988.  2. Rectal fistula repair.   SOCIAL HISTORY:  She resides in [place] with her husband.  However,  her husband works in [place] and comes home two to three times a year.  She has two children who are going to school at [employer].  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  She quit smoking in  early 2008.  Prior to that, she was smoking two packs a day x20 years.  She denies any alcohol, drugs, herbal medications, specific diet or  exercise.   FAMILY HISTORY:  Unobtainable at this time due to acute situation.   REVIEW OF SYSTEMS:  In addition to the above, notable for occasional use  of glasses, postmenopausal and anxiety.  All other systems are  unremarkable.   PHYSICAL EXAMINATION:  VITAL SIGNS:  In the catheterization lab, blood  pressure is  156/94, pulse 90 and regular, respirations 20, 99%  saturations on 2 L.  GENERAL:  She is a well-developed, well-nourished, pleasant, white  female in no apparent distress.  HEENT:  Unremarkable.  NECK:  Supple without thyromegaly, adenopathy, JVD or bruits.  CHEST:  Symmetrical excursion.  Clear to auscultation anterolaterally.  HEART:  PMI is not displaced.  Regular rate and rhythm.  I did not  appreciate any murmurs, rubs, clicks or gallops.  All pulses are  symmetrical and intact.  I did not appreciate abdominal of femoral  bruits.  SKIN:  Intact.  ABDOMEN:  Slightly round.  Bowel sounds present without organomegaly,  masses or tenderness.  EXTREMITIES:  No clubbing, cyanosis or edema.  MUSCULOSKELETAL:  Unremarkable.  NEUROLOGIC:  Unremarkable.   LABORATORY DATA AND X-RAY FINDINGS:  Chest x-ray is pending.  EKG as  previously described at [facility].   H&H 14.3 and 41.6, normal indices, platelets 334, WBC 9.4.   Sodium 139,  potassium 3.5, BUN 13, creatinine 0.77, glucose 118.  PTT 30, PT 12.6.  Point of care markers showed a troponin of 0.7, myoglobin 357.   IMPRESSION:  1. Acute inferior myocardial infarction, late presentation, based on Q      waves inferiorly.  However, with continued discomfort and ST-      segment elevation requires urgent catheterization.  2. Hypertension.  3. Anxiety.  4. Remote tobacco use.   PLAN:  Urgent catheterization by Dr. Shawnie Pons.  The patient will  be admitted to the CCU.  Future recommendations will be based on  findings.  We will start our usual medications such as aspirin, Lipitor  and Lopressor.      Emily Rued, PA-C      Arturo Morton. [name], MD, [facility]  Electronically Signed    EW/MEDQ  D:  07/18/2007  T:  07/18/2007  Job:  [id number]   cc:   Barbette Hair. Artist Pais, DO

## 2010-11-01 NOTE — Cardiovascular Report (Signed)
Emily [name], Emily [name]          ACCOUNT NO.:  [id number]   MEDICAL RECORD NO.:  [id number]          PATIENT TYPE:  INP   LOCATION:  2901                         FACILITY:  [facility]   PHYSICIAN:  Arturo Morton. [name], MD, FACCDATE OF BIRTH:  [birth year]   DATE OF PROCEDURE:  07/18/2007  DATE OF DISCHARGE:                            CARDIAC CATHETERIZATION   INDICATIONS:  Emily [name] is a 60 year old who presented to the  emergency room with ongoing chest pain.  She stopped smoking about 6-8  months ago.  She now presents with an acute inferior infarction.  She  was seen in the ER by the emergency room staff, and Dr. Excell Seltzer  contacted.  She was transferred to the [facility]  ER for urgent intervention.   PROCEDURES:  1. Left heart catheterization.  2. Selective coronary arteriography.  3. Selective left ventriculography.  4. Percutaneous stenting of the obtuse marginal.   DESCRIPTION OF PROCEDURE:  The patient was brought to the  catheterization laboratory, prepped and draped in usual fashion.  Through an anterior puncture, the femoral artery was entered.  A 6-  French sheath was placed.  Prior to the procedure, we had discussed the  need for urgent catheterization to her.  She agreed to proceed.  The  patient had been heparinized, an ACT was checked.  She had also received  aspirin and clopidogrel in the emergency room at [facility].  We then did diagnostic views of the left and right coronary arteries.  The patient had evidence of an occluded marginal branch.  Preparations  were then made for percutaneous intervention.  Bivalirudin was given  according to protocol.  Follow-up ACT obtained.  We used a JL-3.5  guiding catheter and a Prowater wire.  We were able to cross the lesion  and dilate with a 2.25 x 15 Maverick balloon.  Following doing this, we  then dilated the artery and we carefully measured and reviewed the area  for stenting.  Given the  acute MI nature, we elected to place a nondrug-  eluting platform.  A 2.5 x 18 Multilink Vision stent was then placed  down to the lesion.  This was then dilated up to 13 atmospheres.  We did  not go any higher than this, largely because of the tapering of the  distal vessel.  Eighty percent of the top portion of the stent was then  postdilated using a 2.75 Quantum Maverick balloon.  This was taken up to  12 and 14 atmospheres throughout the whole proximal aspect of the stent.  There was no evidence of edge tear and there was marked improvement in  appearance of the artery.  Ultimately, the guidewire was pulled back.  We used this is an anchor to try to better engage the left anterior  descending artery to get visualization.  Following this, all catheters  were subsequently removed, pigtail catheter was then placed in the LV  and ventriculography done in the RAO projection.  There were no  complications.  The femoral sheath was then sewn into place and she was  taken to the holding area in  satisfactory clinical condition.  We timed  discontinuation of the bivalirudin to the administration of the  clopidogrel which had been done in the emergency room at [facility].   HEMODYNAMIC DATA:  1. Central aortic pressure was 106/72.  2. Left atrial pressure was 102/7.  3. There was no gradient pullback across aortic valve.   ANGIOGRAPHIC DATA:  1. Ventriculography in the RAO projection revealed really pretty      vigorous global systolic function.  Ejection fraction was felt to      be in excess of 55%.  2. The left main is free of critical disease, it is a short, nearly      separate, ostia.  3. The LAD courses to the apex.  There is a 50-70% area of segmental      plaquing between the origins of the first and second diagonal.      After the second diagonal, there is about 30% narrowing.  The      distal LAD wraps the apex.  4. The circumflex proper is a dominant vessel.  There is  a first      marginal that has probably about segmental ostial 50% narrowing.      The second marginal comes out at a slightly different angle, but      also has 40% narrowing at its origin.  Following a tiny marginal,      then there is a third major marginal that is basically totally      occluded.  There is a ruptured plaque.  The ruptured plaque ends      just proximal to a bifurcation distally.  Following stenting the      100% is reduced to 0% with a nice angiographic lumen.  TIMI-3 flow      is restored from TIMI-0 flow.  There was some spasm in the distal      vessels which is improved after all wire removal.  The AV      circumflex provides a PDA that is without critical narrowing.  5. The right coronary is a nondominant vessel and free of critical      disease.   CONCLUSION:  1. Acute inferior wall myocardial infarction due to occlusion of the      major third marginal with successful reperfusion using a nondrug-      eluting stent platform.  2. Preserved overall left ventricular function.  3. Scattered coronary disease as noted above.   DISPOSITION:  1. Aggressive risk factor reduction.  2. Cardiac rehabilitation.  3. Continue efforts to stop smoking.      Arturo Morton. [name], MD, [facility]  Electronically Signed     TDS/MEDQ  D:  07/18/2007  T:  07/18/2007  Job:  [id number]   cc:   CV Lab  Barbette Hair. Artist Pais, DO

## 2010-11-01 NOTE — Assessment & Plan Note (Signed)
[facility]                                 ON-CALL NOTE   NAME:Fread, MONTEEN TOOPS                 MRN:          [id number]  DATE:08/19/2007                            DOB:          [birth year]    BRIEF HISTORY:  Ms. Tokar is a 60 year old female who was recently  discharged from the hospital on February 2 and followed up with Dr.  [name] in the office February 13 post MI where she was treated with  bare metal stenting to a ruptured plaque in the distal circumflex.  Since discharge she states that she has been doing well and has a stress  Myoview this Thursday.  This afternoon, she began noticing vague  feelings in her chest that she could not describe.  They were  unassociated with activity, deep breathing, twisting, turning or  tenderness.  She stated that they would last 5-10 seconds and then go  away.  She cannot discern any pattern.  She did state she tried taking  nitroglycerin around 5:00 p.m.  However, she is not clear if that helped  or not.  She is adamant that it does not resemble her recent symptoms of  a myocardial infarction.  She states that if she had not had these heart  problems, she with have attributed it to an anxiety reaction.  However,  she states that she has not been particularly stressed today.  She did  not participate in cardiac rehab due to the weather conditions today.  Although she states that she is very anxious and worried about the  upcoming stress test on Thursday.   I reassured Mrs. Mcneice that the discomfort that she was describing  the above is probably not related to her heart.  However, if she wishes  [place] to evaluate her,  her only alternative would be to come to [facility]  emergency room for evaluation.  She states that she has been monitoring  her blood pressure and this seems to be well controlled.  I also  explained to her that another option would be to relax, if she continued  to have further  indescribable chest symptoms that they began to last  longer or resemble her symptoms of her prior myocardial infarction, she  should call us back and then proceed to the nearest emergency room for  evaluation.  She elected to proceed with the latter option and stated  she would call back if she had any difficulties.     [name], PA-C  Electronically Signed    EW/MedQ  DD: 08/19/2007  DT: 08/19/2007  Job #: [id number]

## 2010-11-01 NOTE — Letter (Signed)
Oct 31, 2007    Barbette Hair. [place], DO  [address] [place], [place] [address]   RE:  [name], [name]  MRN:  [id number]  /  DOB:  1950/10/31   Dear [name],   I had the pleasure of seeing Emily [name] in the office, today, in a followup  visit.  As you know, she underwent emergent percutaneous intervention of  the circumflex coronary artery with a bare metal stent.  She is on dual  antiplatelet therapy.  She has been tolerating all of these reasonably  well.  She stopped smoking nearly a year ago.  Her biggest complaint has  been weight gain.  She was also told by your office that she had a  mildly elevated sugar, and in reviewing the laboratory studies my  assumption is that this is related to the hemoglobin A1c of 6.1%.  She  and I discussed diet for nearly 1/2 hour.   Her medications include:  Aspirin 325 mg daily, Plavix 75 mg daily,  Lipitor 80 mg daily, and Lopressor 25 mg 1/2 tablet b.i.d.   On physical she is alert and oriented in no distress.  The blood  pressure is 120/80, the pulse is 72.  The weight is 153 pounds.  The  lung fields are clear to auscultation and percussion.  The PMI is  nondisplaced.  There is a normal first and second heart sound without a  murmur, rub, or gallop.   Review of the laboratory studies revealed a TSH of 2.0.  Her hemoglobin  was 13.8.  HDL cholesterol was 36.4 and the LDL was 89.  Her glucose was  normal at 85, and a hemoglobin A1c of 6.1%.   In summary, this nice woman has evidence of coronary artery disease.  She has had a myocardial infarction, but has stopped smoking.  Her LDL  remained somewhat elevated on a fairly high dose of atorvastatin.  We  discussed diet in some detail.  At 153 she could stand loose up to about  20 pounds.  This would improve her hemoglobin A1c, and most likely  improve her  serum LDL cholesterol.  Based on these findings, we will not make any  more recommendations at the present time.  We will have her return to  see [place] in followup in cardiology in approximately 3 months.  If she has  any problems in the interim, please do not hesitate to let me know; and  I would be more than happy to see her at any time.    Sincerely,      Arturo Morton. [name], MD, [facility]  Electronically Signed    TDS/MedQ  DD: 10/31/2007  DT: 10/31/2007  Job #: [id number]

## 2010-11-01 NOTE — Discharge Summary (Signed)
Emily [name], [place]          ACCOUNT NO.:  [id number]   MEDICAL RECORD NO.:  [id number]          PATIENT TYPE:  INP   LOCATION:  3740                         FACILITY:  [facility]   PHYSICIAN:  Arturo Morton. [name], MD, FACCDATE OF BIRTH:  [birth year]   DATE OF ADMISSION:  07/18/2007  DATE OF DISCHARGE:  07/22/2007                               DISCHARGE SUMMARY   PRIMARY CARDIOLOGIST:  [name] [name], MD, [facility]   PRIMARY CARE PHYSICIAN:  [name]   PROCEDURES PERFORMED DURING HOSPITALIZATION:  1. Left heart catheterization performed by [name] on      07/18/2007.  (A)  Colon occlusion of the major third marginal.  (B)  Successful reperfusion using nondrug-alluding stent platform.  (C)  Preserved overall left ventricular function.  (D) Scattered coronary disease.  1. Lower extremity Doppler studies secondary to calf pain found to be      negative.   FINAL DISCHARGE DIAGNOSES:  1. Acute inferior myocardial infarction.  (A)  Status post PCI to the third obtuse marginal using a nondrug-  alluding stent.  1. Hypertension.  2. Hyperlipidemia.  3. Insomnia.  4. Anxiety.   HOSPITAL COURSE:  This is a 60 year old Caucasian female who presented  to [facility] complaining of chest discomfort.  Since 10 p.m.  the night before admission, she noticed anterior chest heaviness  radiating to her back.  She has denied any associated shortness of  breath, vomiting or diaphoresis.  She did have some mild nausea.  The  patient took some Tylenol p.m. without relief.  She went to bed around  11:00 p.m. or 11:30.  Had a restless night prior to admission.  The  following morning, she continued to have chest discomfort, and she  attributed it to anxiety but called her primary care physician and  expressed her symptoms.  He advised her to go to the emergency room at  which time she was seen at [facility] ER.  She was found to be  hypertensive with a blood pressure of 170/80,  a pulse of 68.  Her EKG  showed inferior Q-waves but inferior ST elevation as well, and she was  transferred emergently to [facility] for catheterization.   The patient was seen and examined by [name], physician assistant,  and was taken emergently to cardiac catheterization lab where she was  seen also by [name] and underwent emergent cardiac  catheterization.  Cardiac catheterization revealed a 100% occluded third  OM which was reduced to 0% status post multilink multivision stent  placed at 2.5 x 18 mm.  The patient had simultaneous relief of chest  discomfort once this was completed.  The patient also was shown to have  left main free of critical disease, but it is short with a nearly  separate ostia.  The LAD had 50 to 70% area of segmental plaquing  between the origins of the first and second diagonal.  After the second  diagonal, there was 30% narrowing.  The distal LAD wrapped around the  apex.  The circumflex was dominant.  The first marginal had 50%  narrowing.  The second marginal had a 40% narrowing at its origin.  It  was also found that the patient had a ruptured plaque just proximal to  the bifurcation distally and third major marginal.  The right coronary  artery was found to be nondominant and free                   of  clinical disease.   The patient recovered well post catheterization and PCI with no  recurrence of chest discomfort.  The patient did not have any  arrhythmias or new changes in her EKG.  It was found that the patient  was mildly hypotensive after procedure, and her metoprolol was adjusted.  The patient also was found to have hyperlipidemia per lab work with an  elevated cholesterol of 212.  Her triglycerides were 146.  Her LDL was  147, and her HDL was 36.  The patient was subsequently placed on Lipitor  80 mg p.o. q. Day.   The patient was seen and examined and monitored over the weekend by Dr.  Bonnee Quin and [name] and was stable.  She did have some mild  anxiety during the hospitalization, but it was treated with antianxiety  medication.  The patient on the day of discharge was feeling good.  She  had no complaints of chest pain, but she did have some complaints of  right cath discomfort.  A subsequent Doppler study was completed of her  lower extremities and was found to show no obvious evidence of DVT  bilaterally, and the patient was found to be stable to go home with  followup appointment with Dr. [name].  The patient was advised not to  drive for two weeks.  It was also noted the patient was not placed on an  ACE inhibitor secondary to hypotension.   DISCHARGE LABS:  As stated cholesterol 212, lipids 146, HDL 36, LDL 147.  Sodium 140, potassium 3.8, chloride 105, CO2 29, glucose 108, BUN 13,  creatinine 0.82.  Hemoglobin 12.4, hematocrit 36.2, white blood cells  7.8, platelets 245.  TSH 2.224.  Troponin final was 15.77.  Prior to  that it was 28.78 and 47.50 respectively.  EKG on discharge revealed  normal sinus rhythm with inferior Q-waves noted.   VITAL SIGNS ON DISCHARGE:  Blood pressure 102/72, pulse 68, respirations  18, temperature 96.1.   DISCHARGE MEDICATIONS:  1. Aspirin 325 mg daily.  2. Plavix 75 mg daily.  3. Lipitor 80 mg at bedtime.  4. Lopressor 12.5 mg twice a day.  5. Nitroglycerin 0.4 mg p.r.n. chest discomfort.  The addition of ACE      inhibitor will be discussed by Dr. [name] on followup visit.      Xanax 0.25 mg q 4 hours p.r.n.Marland Kitchen   ALLERGIES:  PENICILLIN, PHENERGAN, COMPAZINE, SULFA, AND THORAZINE.   FOLLOWUP PLANS AND APPOINTMENTS:  1. The patient will see [name] on February 13 at 4:15 p.m.  2. The patient has been given post cardiac catheterization      instructions with particular emphasis on the right groin site for      evidence of bleeding, hematoma, signs of infection or severe pain.  3. The patient is to follow up with Dr. Cathie Hoops, primary  care physician      Once she is discharge, she is to make that appointment.  4. The patient has been given prescriptions for her new medications      with  advisement to be compliant and to bring medications to      followup appointment.   TIME SPENT WITH THE PATIENT:  Physician time:  45 minutes.      Bettey Mare. Lyman Bishop, NP      Arturo Morton. [name], MD, [facility]  Electronically Signed    KML/MEDQ  D:  07/22/2007  T:  07/22/2007  Job:  [id number]   cc:   Landry Corporal Dr.

## 2010-11-01 NOTE — Assessment & Plan Note (Signed)
[facility]                            CARDIOLOGY OFFICE NOTE   NAME:Emily [name], Emily [name]                 MRN:          [id number]  DATE:01/30/2008                            DOB:          1951/06/17    Ms. Meller is in for followup today.  She is accompanied by her  husband.  Since I last saw her, she seems to be getting along well.  She  finished her semester without major difficulties.  She has not been  smoking.  She seems to be in good spirits.  She denies specific chest  pain.   She brought in a log today of both her diet, and everything else she is  taking.   Her current medications include aspirin 325 mg daily, Plavix 75 mg  daily, Lipitor 80 mg daily, Lopressor 25 mg 1/2 tablet p.o. b.i.d.,  Flaxseed oil 1200 mg daily, fish oil 1200 mg daily, Cal Mag Zinc 1  daily, Chromium picolinate 1 daily, garlic daily, beef daily, CoQ10  daily, and green tea daily.   On physical, she is alert and oriented in no distress.  Blood pressure  is 124/64, pulse 47.  The lung fields are clear and the cardiac rhythm  is regular.   Electrocardiogram demonstrates normal sinus rhythm/sinus bradycardia and  is otherwise within normal limits.   IMPRESSION:  Coronary artery disease, status post percutaneous coronary  intervention for acute myocardial infarction.   RECOMMENDATIONS:  1. She can decrease her aspirin to 81 mg daily.  2. I would have her decrease her decrease her Lopressor to 1/4 tablet      b.i.d.  3. Repeat stress test will be recommended in approximately 3 months.  4. Continued followup with Dr. Cathie Hoops.  5. Continued encouragement of good habits.     Arturo Morton. [name], MD, [facility]  Electronically Signed    TDS/MedQ  DD: 01/31/2008  DT: 01/31/2008  Job #: [id number]

## 2010-11-01 NOTE — Procedures (Signed)
[facility]                              EXERCISE TREADMILL   NAME:[name], Emily Halsted                     MRN:          [id number]  DATE:05/21/2008                            DOB:          1950/10/26    Duration of exercise 9 minutes.  Maximum heart rate 139.  Percent of  PMHR is 85%.   COMMENTS:  [name] exercised today on the Bruce protocol.  She completed  9 minutes.  She experienced no chest pain.  Test was terminated due to  volitional fatigue.  The blood pressure rose appropriately.  Heart rate  rose up to 85% of age predicted maximum without ischemic change.  Resting electrocardiogram demonstrates normal sinus rhythm at maximum  stress, no significant exercise-induced chest pain or significant ST  depression is noted.   CONCLUSIONS:  1. Exercise tolerance test demonstrating relatively good exercise      tolerance.  No exercise-induced chest pain or ST depression.  2. Prior percutaneous intervention of the circumflex in the setting of      an acute inferior wall myocardial infarction.  The treated vessel      was the distal circumflex.  There was some mild distal spasm which      improved after wire removal.   PLAN:  Continue current medical regimen except for reduction in aspirin.  The patient has not stopped her aspirin has been recommended on the  previous office visit.  I had suggested cutting the aspirin to 81 mg and  also had discussed reducing her Lopressor to one fourth b.i.d. because  of hair loss, but she will, but she has not done this.  She wants to  remain on the same medical regimen except for the aspirin dose.  She can  stop her Plavix at the end of January.  We will see her in early  February and revise her medical regimen at that time.  She did not want  a second drug such as an ACE inhibitor.     Arturo Morton. [name], MD, [facility]  Electronically Signed    TDS/MedQ  DD: 05/21/2008  DT: 05/22/2008  Job #: [id number]

## 2010-11-01 NOTE — Assessment & Plan Note (Signed)
[facility]                            CARDIOLOGY OFFICE NOTE   NAME:Emily [name], Emily [name]                 MRN:          [id number]  DATE:08/30/2007                            DOB:          [birth year]    Emily [name] is in for follow-up.  She has had quite honestly a very  rare episode of chest discomfort.  It has not been in any way steady or  recurrent.  She has, otherwise, remained stable.  She would like to take  on a full schedule of work next fall with her classes.   CURRENT MEDICATIONS:  1. Aspirin 325 mg daily.  2. Plavix 75 mg daily.  3. Lipitor 80 mg daily.  4. Lopressor 50 mg 1/2 tablet p.o. b.i.d.   The patient currently appears to be stable.  She underwent radionuclide  imaging on the 5th.  She had a heart rate of 150 and no significant ST  depression.  Perfusion imaging demonstrated only a small territory of  the inferolateral segment.  This suggested the area of infarct but no  ischemia.   PHYSICAL EXAMINATION:  VITAL SIGNS:  Today blood pressure is 125/74,  pulse 73.  LUNGS:  Lung fields are clear.  CARDIAC:  Cardiac rhythm is regular.   Overall this patient appears to be doing well.  A diagnostic cardiac  catheterization at the time of her intervention she had a 50-70% LAD  stenosis between the origins of the first and second diagonal.  The  right coronary artery was nondominant.  The ventriculogram demonstrated  a significantly large wall motion abnormality.   PLAN:  1. Continued dual antiplatelet therapy.  2. Continue lipid lowering agents and get a lipid and liver profile.  3. Return to clinic in 2-3 months.  4. Reassurance.  5. 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     Arturo Morton. [name], MD, [facility]  Electronically Signed    TDS/MedQ  DD: 09/02/2007  DT: 09/02/2007  Job #: [id number]

## 2010-11-04 NOTE — Assessment & Plan Note (Signed)
[place] [facility]                               PULMONARY OFFICE NOTE   NAME:Emily [name], Emily [name]                   MRN:          [id number]  DATE:04/18/2006                            DOB:          03-31-1951    HISTORY OF PRESENT ILLNESS:  The patient is a 60 year old white female who  comes in today for evaluation of insomnia.  The patient states that a few  years ago she lived in [place] and when she moved back to the [place]  to [place], she began to have difficulties with her sleep.  She then  moved to [place] for three years and then has now been in [place] for  one year.  The patient states that she has trouble both getting to and also  staying asleep.  She typically goes to bed about 10 p.m. and unfortunately  watches TV in bed.  She will typically fall asleep between 12 and 12:30 but  unfortunately she leaves the TV on all night.  The patient states that she  has a fear of the dark and has had this for many, many years.  She has  actually been sleeping with the TV on her whole life with the sound turned  off.  The patient will frequently awaken and if she cannot return to sleep  she will channel surf.  This occurs many times during the night.  Whenever  she arises she feels that she has not slept well and feels tired but is not  able to sleep during the day or take naps.  She gets up to start her day at  approximately 8 a.m.  The patient admits that she is scared of going to  sleep because of the dark and also thinks that anxiety is playing a major  role here.  She has resorted to taking Tylenol PM as well as Xanax and also  drinking champagne at times.  She does have a history of snoring but denies  choking arousals and no one has ever witnessed pauses in her breathing  during sleep.  In terms of her caffeine intake during the day she drinks one  Coke in the morning but no coffee or tea ever.   PAST MEDICAL HISTORY:  1.  Significant for hypertension.  2. History of dyslipidemia.  3. History of allergic rhinitis, otherwise noncontributory.   MEDICATIONS:  She takes no medications on a regular basis except Flonase  p.r.n.  She also takes Xanax and Tylenol PM p.r.n. as stated above.   ALLERGIES:  1. The patient is allergic to PENICILLIN.  2. CODEINE.   SOCIAL HISTORY:  She is married and has children.  She has a history of  smoking for 25 years and currently is at 1-1/2 packs per day.   FAMILY HISTORY:  Remarkable for her father having allergies and her mother  having had skin cancer, otherwise noncontributory.   REVIEW OF SYSTEMS:  As per history of present illness.  Also see patient  intake form documented in the chart.   PHYSICAL EXAMINATION:  GENERAL:  In general she is a well-developed  white  female in no acute distress.  VITAL SIGNS:  Blood pressure 124/82, pulse 69, temperature 98.6, weight is  134 pounds, O2 saturation on room air was 94%.  HEENT:  Pupils equal, round, and reactive to light and accommodation.  Extraocular muscles are intact.  Nares are somewhat narrow but patent.  Oropharynx does show moderate elongation of soft palate and uvula.  NECK:  Supple without JVD or lymphadenopathy.  There is no palpable  thyromegaly.  CHEST:  Totally clear.  CARDIAC:  Reveals regular rate and rhythm.  No murmurs, rubs, or gallops.  ABDOMEN:  Soft, nontender, with good bowel sounds.  GENITAL/RECTAL/BREASTS:  Exams not done, not indicated.  EXTREMITIES:  Lower extremities are without edema.  Good pulses distally.  There is no calf tenderness.  NEUROLOGIC:  Alert and oriented with no obvious motor deficits.   IMPRESSION:  Psychophysiologic insomnia.  I suspect some of her issues  started with her long distance travels between large numbers of time zones  and now has been exacerbated by her significant anxiety about being alone in  the dark and also poor sleep hygiene.  The patient has been  sleeping with  the TV on for years, and has been using benzodiazepines, over the counter  sleep aids, as well as alcohol to help her with sleep.  I have had a long  discussion with her about the behavioral changes needed in order to try and  resolve this.  I will also try to help her get her __________ rhythm back on  track with Rozerem and also give a short trial of Trazodone to help with  sleep onset and possibly an element of depression here.  I have gone over  stimulus control with her about not staying in the bed if she is not able to  get to sleep.  I have asked her to go out to her family room and watch TV  under low lights or read.  She immediately became teary-eyed and said that  she was fearful about being in the family room at night with no one else  home.  Clearly she may benefit from psychologic/psychiatric evaluation.   PLAN:  1. I have discussed stimulus control with the patient and she will give it      her best try if she can overcome her fear.  2. Rozerem 8 mg q.h.s. along with Trazodone 50 mg q.h.s.  3. I have told the patient to drink no alcohol after 8 p.m.  4. I have asked the patient to turn off the TV and do not watch it in any      shape or form while she is in bed.  5. I have gone over various sleep hygiene issues with her.  6. Consider checking thyroid functions at next visit if she is continuing      to have problems.  7. If the patient is continuing to do poorly, I would recommend      psychologic/psychiatric evaluation.  The patient will follow up in 3-4      weeks or sooner if there are problems.    ______________________________  Barbaraann Share, MD,FCCP    [facility]/MedQ  DD: 04/18/2006  DT: 04/19/2006  Job #: [id number]

## 2010-11-04 NOTE — Assessment & Plan Note (Signed)
[place] [facility]                               PULMONARY OFFICE NOTE   NAME:Emily [name], Emily [name]                 MRN:          [id number]  DATE:02/21/2006                            DOB:          March 21, 1951    PULMONARY EXTENDED FOLLOWUP OFFICE VISIT   HISTORY:  60 year old white female active smoker, whose main complaint is  that she never got over bronchitis with a sensation of persistent nasal  and chest congestion with mostly a dry, hacking cough that is worse when  she lies down and early in the mornings.  She denies any purulent sputum  production, fevers, chills, sweats, orthopnea, PND, leg swelling or  significant dyspnea over baseline.   Medications reviewed with the patient in detail indicate that she does use  Flonase on a p.r.n. basis.  Unfortunately, she uses Tylenol P.M. on a  nightly basis and has done so for years.   PHYSICAL EXAMINATION:  GENERAL:  She is a pleasant white female with a very  marked nasal tone to her voice and a congested-sounding cough.  ENT:  She has an oropharynx that reveals minimal cobbling stoning. Moderate  turbinate edema.  Ear canals clear bilaterally.  LUNGS:  Lung fields reveal minimal junky rhonchi bilaterally worse with FVC  maneuver.  HEART:  There is a regular rhythm without murmur, gallop or rub.  ABDOMEN:  Soft and benign.  EXTREMITIES:  Warm without calf tenderness, cyanosis, clubbing or edema.   PFTs were reviewed with the patient and indicate surprisingly that she has  an FEV-1 of 113% predicted with a ratio of 71% and a diffusion capacity that  is also 86% predicted.  There is only a very subtle reduction in mid flow  rate suggested by the flow volume contouring.   Impression is amenable chronic obstructive pulmonary disease despite  longstanding smoking history with marked and disproportionate effect of  mucociliary function.  I suspect this would also be a major risk factor for  chronic  sinusitis and therefore I have recommended the following:  1. Absolutely she should commit to and follow through on smoking      cessation.  The fact that she did it before with patches would      indicate she can do it again and if she cannot do it on her own with      patches then she is willing to set a quit date.  I have offered to see      her back here for counseling with the use of Chantix for 7 days prior      to quit date.  2. To improve mucociliary function, I have recommended she continue      Mucinex 2 b.i.d. p.r.n.  3. I also spent extra time educating her regarding the issue of nasal      hygiene and recommended that she switch her Flonase to 2 puffs b.i.d.      with Afrin for the first 5 days and then taper the Flonase down to 1      puff at bedtime rather than stop it, as long as the  underlying cause of      the inflammation, cigarettes, remains an issue.  4. She also complained of insomnia and I did recommend Xanax 0.5 mg to be      taken p.r.n. but      only gave her a 30-day supply and also reviewed with her optimal sleep      hygiene and emphasized the importance of avoiding Tylenol P.M. which      contains Benadryl.                                   Emily [name]. Emily Sires, MD, [facility]   MBW/MedQ  DD:  02/21/2006  DT:  02/22/2006  Job #:  [id number]

## 2010-11-04 NOTE — Assessment & Plan Note (Signed)
[facility]                           PRIMARY CARE OFFICE NOTE   NAME:Emily [name], Emily [name]                 MRN:          [id number]  DATE:09/04/2006                            DOB:          Jun 18, 1951    HISTORY AND PHYSICAL   CHIEF COMPLAINT:  New patient to practice.   HISTORY OF PRESENT ILLNESS:  The patient is a 60 year old white female  here to establish primary care.  She was formerly followed by Dr. Darnelle Catalan  of [facility].   PAST MEDICAL HISTORY:  Significant for tobacco abuse.  She underwent a  pulmonary function testing in September of 2007.  It did not reveal  significant chronic obstructive lung disease.  She continues to smoke up  to 2 packs per day.  She has been smoking on and off since age 89.  She  has tried to use patches in the past with some success, but returned to  smoking.  She has heard about Chantix before.  Her sister apparently had  some issues with vivid dreams.  In general, she relates apprehension  about long term prescription drug use.  She is unsure whether she is  ready to consider starting Chantix therapy.   She has seen Dr. Shelle Iron of [facility] regarding chronic  insomnia.  She frequently watches TV in bed, falling asleep 1 to 2 in  the morning.  She has issues with some anxiety at night.  She lives  alone.  Her husband is still working in [place] and visits her 2 to 3  times per year.  She has 2 children who are older and are students at  [employer].  She does have a history of using valium in the past, and  states she had a problem with dependence.  Currently, she takes p.r.n.  Xanax on an infrequent basis once or twice per week.  At the end of Dr.  Teddy Spike evaluation, he recommended trial of Rozerem combined with  trazodone.  Again, she was afraid of potential side effects and never  started this therapy.  She has been unable to change her sleep hygiene  patterns, such as  watching TV in bed.  She states that watching TV is  somewhat comforting and makes her feel less alone.   She was noted to have elevated blood pressure readings in the past and  high cholesterol.  She relates both of these things to taking inhaled  steroids.  Apparently Dr. Darnelle Catalan has performed recent blood work and  cholesterol readings are relatively normal.   She denies any history of heart disease.  No history of stroke.  Denies  any history of chest pain.  No symptoms of claudication.   PAST MEDICAL HISTORY SUMMARY:  1. Ongoing tobacco abuse.  2. History of anxiety/depression.  3. History of elevated blood pressure readings.  4. History of high cholesterol.  5. Colon polyps.  6. C-section in 1987.  C-section in 1988.  7. Perirectal abscess in the 1970s.   CURRENT MEDICATIONS:  Flonase twice a day.  Xanax unknown dose p.r.n.  Tylenol P.M. p.r.n.   ALLERGIES TO  MEDICATIONS:  SULFA, PHENERGAN, CODEINE, PENICILLIN,  HYDROCODONE, which causes vomiting and nausea.  She also has intolerance  to COMPAZINE and THORAZINE.   SOCIAL HISTORY:  She is married, currently a homemaker.  She previously  lived in [place] and also in [place].   FAMILY HISTORY:  Notable for a stroke and grandparents known to have  diabetes.   HABITS:  She drinks seldom.  Tobacco use as noted above.   PREVENTATIVE CARE HISTORY:  Last mammogram performed this year in 2008.  Last colonoscopy in 2007.  Last Pap smear and pelvic in 2007.   REVIEW OF SYSTEMS:  No fevers or chills.  She has had recent rhinitis,  triggered by perfume.  Denies any chest pain.  Occasional cough.  No  heartburn, nausea or vomiting, constipation, diarrhea.  No dark stools  or blood in her stool.  No dysuria, frequency, or urgency.  All other  systems negative.   PHYSICAL EXAM:  VITAL SIGNS:  Weight is 136 pounds, temperature 97.6,  pulse 88, BP 138/80 in the left arm in a seated position.  GENERAL:  The patient is a pleasant  well-developed, well-nourished 49-  year-old white female who appears slightly older than stated age.  HEENT:  Normocephalic, atraumatic.  Pupils equal, round, and reactive to  light bilaterally.  Extraocular motility intact.  The patient was  anicteric.  Conjunctivae within normal limits.  External auditory canals  and tympanic membranes were clear bilaterally.  Oropharyngeal exam was  unremarkable.  NECK:  Supple.  No adenopathy, cardioid bruit, or thyromegaly.  CHEST:  Normal respiratory effort.  Clear to auscultation bilaterally.  No rhonchi, rales, or wheezing.  CARDIOVASCULAR:  Regular rate and rhythm.  No significant murmurs, rubs,  or gallops appreciated.  ABDOMEN:  Soft and nontender.  Positive bowel sounds.  Unable to  appreciate any organomegaly.  MUSCULOSKELETAL:  No cyanosis, clubbing, or edema.  SKIN:  Warm and dry.  NEUROLOGIC:  Cranial nerves 2-12 are grossly intact.  The patient had a  frequent tearfulness during our interview and exam.   IMPRESSION/RECOMMENDATIONS:  1. Ongoing tobacco abuse.  2. Chronic insomnia.  3. Generalized anxiety disorder.  4. Elevated blood pressure without diagnosis of hypertension.  5. History of hyperlipidemia.  6. Health maintenance.   RECOMMENDATIONS:  The patient defers trial of Chantix.  She will  investigate medication on her own before starting.  She also declines  referral to a psychiatrist regarding general anxiety disorder and  insomnia, and also defers potential start of SSRI.   We will try to obtain previous records from Dr. Darnelle Catalan, as she was sent  for lipids, BMET, liver function tests, and TSH.   I encouraged the patient to use alprazolam sparingly, especially with a  history of dependence issues with diazepam.   In terms of her blood pressure, she will monitor at least 10 outpatient  readings and follow up with me in approximately 6 weeks.    Barbette Hair. Artist Pais, DO  Electronically Signed    RDY/MedQ  DD: 09/04/2006   DT: 09/04/2006  Job #: [id number]

## 2010-11-04 NOTE — Letter (Signed)
May 04, 2008    Mrs. Emily [name]. 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   Dr. Randon Goldsmith.  Professor of Sales promotion account executive  [employer].   [name], [name]  MRN:  [id number]  /  DOB:  01/11/1951   To whom it may concern:   [name] is a patient under my care.  It would be in the best  interest of her health to drop her piano course for this semester.  It  has resulted, according to her in significant high blood pressure right  after class.  This would be my recommendation.    Sincerely,      Arturo Morton. [name], MD, [facility]  Electronically Signed    TDS/MedQ  DD: 05/04/2008  DT: 05/05/2008  Job #: [id number]

## 2010-11-04 NOTE — Assessment & Plan Note (Signed)
[facility]                               PULMONARY OFFICE NOTE   NAME:Emily [name]                 MRN:          [id number]  DATE:01/10/2006                            DOB:          04/29/1951    This is a pulmonary new patient evaluation.   CHIEF COMPLAINT:  Cough.   HISTORY:  60 year old white female, active smoker, with a history of  recurrent bronchitis since the age of 89, for which she has previously  received multiple short-term courses of inhalers and antibiotics, and  shots (sometimes including Rocephin in the office apparently), but over  the last year had actually done well until she took 3 recent trips, 1 to  [place], 1 to [place], and 1 to [place], all by car.  She abruptly  became ill after returning to [facility] by care on July 22, with a hacking,  dry cough, worse when she lies down at night, better with Phenergan cough  syrup, generalized chest discomfort, and coughing so hard she thought she  might vomit, with a fever of 101.  She was seen by her local doctor, started  on Biaxin, and was not better within 24 hours, and therefore was switched to  Levaquin and with ProAir added at that point.  She lost confidence in her  response, and comes in today, aggravated that she is not better.  She  actually is feeling a little bit better today than she was yesterday.  She  denies any sore throat, does have an ongoing cough with minimal production,  no more vomiting, no overt reflux symptoms or sinus complaints, myalgias or  arthralgias, chest pain or dyspnea.   PAST MEDICAL HISTORY:  Significant for hypertension, not on ACE inhibitors,  hyperlipidemia and chronic sinus problems.  She also has been documented  with mitral valve regurgitation.   ALLERGIES:  She states she is intolerant of PENICILLIN, CODEINE, PHENERGAN,  COMPAZINE, HYDROCODONE and SULFA.   MEDICATIONS:  None regularly, but presently is on both Levaquin  and ProAir.   SOCIAL HISTORY:  She continues to smoke a half a pack per day, denies any  significant alcohol use or unusual travel or hobby exposure, (see comments  regarding recent travel).  She had been living in [place] for a year through  December of 2006.   FAMILY HISTORY:  Significant for allergies in her father, otherwise negative  for respiratory diseases in direct relatives or atopy.   REVIEW OF SYSTEMS:  Taken in detail on her worksheet, and significant for  the problems as outlined above.   PHYSICAL EXAMINATION:  GENERAL:  This is an anxious white female in no acute  distress.  VITAL SIGNS:  Stable vital signs.  HEENT:  Unremarkable.  Oropharynx is clear with no thrush or erythema.  Ear  canals are clear bilaterally.  NECK:  Supple without cervical adenopathy or tenderness.  Trachea was  midline without thyromegaly.  LUNGS:  Lung fields are clear bilaterally to auscultation and percussion,  although she did cough on deep inspiratory maneuvers.  There was excellent  air movement.  There was regular rhythm without murmur,  gallop or rub.  ABDOMEN:  Soft and benign.  EXTREMITIES:  Warm without calf tenderness, cyanosis, clubbing or edema.   Venous saturation was 94% on room air.   No recent chest x-ray available.   IMPRESSION:  Acute tracheal bronchitis in a patient who has probable chronic  mucociliary dysfunction related to smoking and possible secondary reflux  from coughing paroxysms, fueling the fire of upper airways inflammation.   RECOMMENDATIONS:  1.  Finish present course of Levaquin.  2.  She can either use her Phenergan cough syrup (I did advised her that      promethazine is Phenergan, and she has listed as intolerant of it, but      seems to be doing fine with it so far) or use Mucinex DM 2 b.i.d.  3.  Stop smoking (I told her it was fine to wear the nicotine patches at      this point).  4.  Use albuterol (ProAir) 2 puffs every 4 hours p.r.n. wheezing or  dyspnea.  5.  I have added Zegerid 40 mg q.h.s. to her regimen, and I gave her a      prescription for prednisone to have on hand for a 6-day course if she      fails to improve over the next 72 hours (I emphasized to her that      typically we wait 72 hours after starting antibiotics before making a      change, especially with such good, broad coverage as has been provided      for her on this occasion, but told her we normally do not use IM      injections of Rocephin for patients in the era of such p.o.      antibiotics).                                   Charlaine Dalton. Sherene Sires, MD, [facility]   MBW/MedQ  DD:  01/10/2006  DT:  01/11/2006  Job #:  [id number]

## 2010-11-04 NOTE — Letter (Signed)
August 07, 2007     RE:  AIJALON, DEMURO  MRN:  [id number]  /  DOB:  13-Aug-1950   To Whom It May Concern:   Mrs. Fort is a patient under my care.  She had a myocardial  infarction, and this was treated with stenting.  I do think it would be  safe for her to take one hour of piano lessens once a week and that it  would be emotionally beneficial.  I do think that continuing in the  choir is a requirement that would not be in her best interest.    Sincerely,      Arturo Morton. [name], MD, [facility]  Electronically Signed    TDS/MedQ  DD: 08/07/2007  DT: 08/07/2007  Job #: [id number]

## 2011-01-03 ENCOUNTER — Telehealth: Payer: Self-pay | Admitting: Internal Medicine

## 2011-01-03 NOTE — Telephone Encounter (Signed)
Patient states that Dr.Yoo prescribed her xanax a year or two ago for sleeping. Patient usually takes tylenol pm but cannot find it anywhere in any of the stores. Pt would like a 1 month refill of xanax sent to [facility] on [address] Phone# [phone].

## 2011-01-03 NOTE — Telephone Encounter (Signed)
Pt states she has been taking Tylenol PM for 10-15 years and occasionally supplements with xanax.  Pt has history of insomnia.  Is this OK to fill?

## 2011-01-03 NOTE — Telephone Encounter (Signed)
Pt last seen by our clinic 01/2010 for UTI. Has no future appts with [place]. Please advise.

## 2011-01-03 NOTE — Telephone Encounter (Signed)
#  30 only and needs to make appt

## 2011-01-04 MED ORDER — ALPRAZOLAM 0.25 MG PO TABS: 0.2500 mg | ORAL_TABLET | Freq: Every evening | ORAL | Status: DC | PRN

## 2011-01-04 NOTE — Telephone Encounter (Signed)
Notified pt and scheduled f/u for 01/24/11 @ 11am with Dr Rodena Medin. Rx called to [facility] at [facility].

## 2011-01-12 ENCOUNTER — Encounter: Payer: Self-pay | Admitting: Internal Medicine

## 2011-01-20 ENCOUNTER — Telehealth: Payer: Self-pay | Admitting: Cardiology

## 2011-01-20 NOTE — Telephone Encounter (Signed)
Left a message to call back.

## 2011-01-20 NOTE — Telephone Encounter (Signed)
Pt wants to know if lab work is needed.  She has appt. 8-16  Please call her at [phone]

## 2011-01-23 ENCOUNTER — Telehealth: Payer: Self-pay | Admitting: Internal Medicine

## 2011-01-23 NOTE — Telephone Encounter (Signed)
Call placed to patient at [phone], no answer. A voice message was left for patient to come in fasting for appointment on 01/24/2011

## 2011-01-23 NOTE — Telephone Encounter (Signed)
Patient would like to know if she needs to fast for appt tomorrow with Dr. Rodena Medin? Per pt, you may leave a detailed message on voicemail.

## 2011-01-24 ENCOUNTER — Ambulatory Visit (INDEPENDENT_AMBULATORY_CARE_PROVIDER_SITE_OTHER): Payer: Managed Care, Other (non HMO) | Admitting: Internal Medicine

## 2011-01-24 ENCOUNTER — Encounter: Payer: Self-pay | Admitting: Internal Medicine

## 2011-01-24 DIAGNOSIS — I2510 Atherosclerotic heart disease of native coronary artery without angina pectoris: Secondary | ICD-10-CM

## 2011-01-24 DIAGNOSIS — I6529 Occlusion and stenosis of unspecified carotid artery: Secondary | ICD-10-CM

## 2011-01-24 DIAGNOSIS — F341 Dysthymic disorder: Secondary | ICD-10-CM

## 2011-01-24 DIAGNOSIS — E785 Hyperlipidemia, unspecified: Secondary | ICD-10-CM

## 2011-01-24 LAB — CBC WITH DIFFERENTIAL/PLATELET
HCT: 45.7 % (ref 36.0–46.0)
Hemoglobin: 14.9 g/dL (ref 12.0–15.0)
Lymphocytes Relative: 31 % (ref 12–46)
Lymphs Abs: 2.7 10*3/uL (ref 0.7–4.0)
Monocytes Absolute: 0.6 10*3/uL (ref 0.1–1.0)
Monocytes Relative: 7 % (ref 3–12)
Neutro Abs: 4.8 10*3/uL (ref 1.7–7.7)
RBC: 4.96 MIL/uL (ref 3.87–5.11)
WBC: 8.7 10*3/uL (ref 4.0–10.5)

## 2011-01-24 LAB — LIPID PANEL
LDL Cholesterol: 91 mg/dL (ref 0–99)
VLDL: 30 mg/dL (ref 0–40)

## 2011-01-24 LAB — HEPATIC FUNCTION PANEL
Alkaline Phosphatase: 89 U/L (ref 39–117)
Indirect Bilirubin: 0.7 mg/dL (ref 0.0–0.9)
Total Bilirubin: 0.9 mg/dL (ref 0.3–1.2)

## 2011-01-24 LAB — BASIC METABOLIC PANEL
BUN: 16 mg/dL (ref 6–23)
Chloride: 102 mEq/L (ref 96–112)
Glucose, Bld: 95 mg/dL (ref 70–99)
Potassium: 4.5 mEq/L (ref 3.5–5.3)

## 2011-01-24 MED ORDER — ALPRAZOLAM 0.25 MG PO TABS: 0.2500 mg | ORAL_TABLET | Freq: Every evening | ORAL | Status: DC | PRN

## 2011-01-26 DIAGNOSIS — I6529 Occlusion and stenosis of unspecified carotid artery: Secondary | ICD-10-CM | POA: Insufficient documentation

## 2011-01-26 NOTE — Assessment & Plan Note (Signed)
Obtain fasting lipid profile and liver function tests. 

## 2011-01-26 NOTE — Progress Notes (Signed)
  Subjective:    Patient ID: Emily [name], female    DOB: 03/15/1951, 60 y.o.   MRN: [id number]  HPI patient presents to clinic for followup of multiple medical problems. Has history of depression and anxiety starting after menopause. Takes Xanax approximately once a week. No evidence of addiction and poor tolerance. Blood pressure viewed as normal. Does have history of insomnia and uses Tylenol PM as well. Recalls a past history of abnormal carotid ultrasound? Mild carotid stenosis versus plaquing. Was noncritical with no recommendation for surgery. No neurologic deficits.  Known history of CAD status post MI without symptoms. Tolerate statin therapy without mild sore abnormal LFTs. No other complaints.  Reviewed past medical history, medications and allergies  Review of Systems see history of present illness     Objective:   Physical Exam  Physical Exam  Vitals reviewed. Constitutional:  appears well-developed and well-nourished. No distress.  HENT:  Head: Normocephalic and atraumatic.  Right Ear: Tympanic membrane, external ear and ear canal normal.  Left Ear: Tympanic membrane, external ear and ear canal normal.  Nose: Nose normal.  Mouth/Throat: Oropharynx is clear and moist. No oropharyngeal exudate.  Eyes: Conjunctivae and EOM are normal. Pupils are equal, round, and reactive to light. Right eye exhibits no discharge. Left eye exhibits no discharge. No scleral icterus.  Neck: Neck supple. No thyromegaly present.  Cardiovascular: Normal rate, regular rhythm and normal heart sounds.  Exam reveals no gallop and no friction rub.   No murmur heard. Pulmonary/Chest: Effort normal and breath sounds normal. No respiratory distress.  has no wheezes.  has no rales.  Lymphadenopathy:   no cervical adenopathy.  Neurological:  is alert.  Skin: Skin is warm and dry.  not diaphoretic.  Psychiatric: normal mood and affect.       Assessment & Plan:

## 2011-01-26 NOTE — Assessment & Plan Note (Signed)
Asymptomatic. Continue risk factor modification. Obtain CBC and Chem-7.

## 2011-01-26 NOTE — Assessment & Plan Note (Signed)
Schedule followup carotid ultrasound

## 2011-01-26 NOTE — Assessment & Plan Note (Signed)
Continue Xanax on a sparing p.r.n. basis

## 2011-02-02 ENCOUNTER — Encounter: Payer: Self-pay | Admitting: Cardiology

## 2011-02-02 ENCOUNTER — Ambulatory Visit: Payer: Self-pay | Admitting: Cardiology

## 2011-02-02 ENCOUNTER — Ambulatory Visit (INDEPENDENT_AMBULATORY_CARE_PROVIDER_SITE_OTHER): Payer: Managed Care, Other (non HMO) | Admitting: Cardiology

## 2011-02-02 VITALS — BP 112/68 | HR 61 | Ht 61.0 in | Wt 155.8 lb

## 2011-02-02 DIAGNOSIS — I2510 Atherosclerotic heart disease of native coronary artery without angina pectoris: Secondary | ICD-10-CM

## 2011-02-02 DIAGNOSIS — I6529 Occlusion and stenosis of unspecified carotid artery: Secondary | ICD-10-CM

## 2011-02-02 DIAGNOSIS — E785 Hyperlipidemia, unspecified: Secondary | ICD-10-CM

## 2011-02-02 NOTE — Assessment & Plan Note (Signed)
We reviewed the recommendation to come off Plavix in detail.  She does not want to do so.  I discussed in this regard the subgroup analysis of Charisma, and she wants to stay on.  I went over the pros and cons with her in detail, including the risks of bleeding.  She understands and wants to continue.

## 2011-02-02 NOTE — Progress Notes (Signed)
HPI:  He is doing well.  She denies any chest pain.  No major symptoms.  We discussed both plavix and her lipids in detail today.  Her husband has retired and is now at home.  Not smoking and has not since 2008  Current Outpatient Prescriptions  Medication Sig Dispense Refill  . Acetylcarnitine HCl (ACETYL L-CARNITINE) 250 MG CAPS Take by mouth. 400 mg every other day       . ALPRAZolam (XANAX) 0.25 MG tablet Take 1 tablet (0.25 mg total) by mouth at bedtime as needed.  30 tablet  3  . aspirin 81 MG tablet Take 81 mg by mouth 2 (two) times daily.        Marland Kitchen atorvastatin (LIPITOR) 80 MG tablet Take 80 mg by mouth daily.        Marland Kitchen b complex vitamins tablet Take 1 tablet by mouth daily.        . Biotin 300 MCG TABS Take 600 mcg by mouth every other day.        . Calcium-Magnesium-Zinc 500-250-12.5 MG TABS Take 1 tablet by mouth every other day.        . Chromium Picolinate 200 MCG TABS Take 1 tablet by mouth every other day.        . Cinnamon 500 MG capsule Take 1,000 mg by mouth daily.        . clopidogrel (PLAVIX) 75 MG tablet Take 75 mg by mouth daily.        . Coenzyme Q10 (COQ10) 200 MG CAPS Take 1 capsule by mouth daily.        Marland Kitchen CRANBERRY JUICE EXTRACT PO Take 500 mg by mouth daily.        . Flaxseed, Linseed, (FLAX SEED OIL) 1000 MG CAPS Take 1,200 mg by mouth daily.        . Garlic Oil 500 MG TABS Take 1 tablet by mouth daily.        Chilton Si Tea, Camillia sinensis, 315 MG CAPS Take 1 capsule by mouth daily.        . metoprolol tartrate (LOPRESSOR) 25 MG tablet Take 12.5 mg by mouth 2 (two) times daily.        . nitroGLYCERIN (NITROSTAT) 0.4 MG SL tablet Place 0.4 mg under the tongue every 5 (five) minutes as needed.        . Omega-3 Fatty Acids (FISH OIL) 1200 MG CAPS Take 1 capsule by mouth daily.        . Rhodiola 300 MG CAPS Take 1 capsule by mouth daily.          Allergies  Allergen Reactions  . Chlorpromazine Hcl   . Codeine     REACTION: causes vomiting  . Hydrocodone    REACTION: causes vomiing and nausea  . Hydrocodone-Acetaminophen   . Penicillins     REACTION: causes rash  . Prochlorperazine Edisylate   . Promethazine Hcl   . Sulfonamide Derivatives     Past Medical History  Diagnosis Date  . Chronic insomnia   . Perirectal abscess   . Colon polyp   . Anxiety   . Depression   . Hypertension   . CAD (coronary artery disease)   . Heart attack 1-09  . Hyperlipidemia     Past Surgical History  Procedure Date  . Cesarean section   . Ptca with stent to third obtuse marginal     Family History  Problem Relation Age of Onset  . Cancer Mother  skin  . Allergy (severe) Father     History   Social History  . Marital Status: Married    Spouse Name: N/A    Number of Children: 2  . Years of Education: N/A   Occupational History  . housewife    Social History Main Topics  . Smoking status: Former Smoker -- 2.0 packs/day for 20 years    Types: Cigarettes    Quit date: 06/19/2006  . Smokeless tobacco: Never Used  . Alcohol Use: No  . Drug Use: No  . Sexually Active: Not on file   Other Topics Concern  . Not on file   Social History Narrative   Resides in [place] with her husband. Husband works in [place] and comes home two to three times a year2 children who go to [place] got accepted to [employer]- NO    ROS: Please see the HPI.  All other systems reviewed and negative.  PHYSICAL EXAM:  BP 112/68  Pulse 61  Ht 5\' 1"  (1.549 m)  Wt 155 lb 12.8 oz (70.67 kg)  BMI 29.44 kg/m2  General: Well developed, well nourished, in no acute distress. Head:  Normocephalic and atraumatic. Neck: no JVD.  I do not appreciate a bruit.  Lungs: Clear to auscultation and percussion. Heart: Normal S1 and S2.  No murmur, rubs or gallops.  Abdomen:  Normal bowel sounds; soft; non tender; no organomegaly Pulses: Pulses normal in all 4 extremities. Extremities: No clubbing or cyanosis. No edema. Neurologic: Alert and oriented x  3.  EKG:  NSR.  WNL.  ASSESSMENT AND PLAN:

## 2011-02-02 NOTE — Assessment & Plan Note (Signed)
I did not appreciate a bruit, but study has been ordered.

## 2011-02-02 NOTE — Patient Instructions (Signed)
Your physician recommends that you schedule a follow-up appointment in: 6 months  

## 2011-02-02 NOTE — Assessment & Plan Note (Signed)
Discussed LDL of 91 and targets. Weight loss recommended.  We had a nice discussion about this.

## 2011-02-10 ENCOUNTER — Encounter: Payer: Managed Care, Other (non HMO) | Admitting: Cardiology

## 2011-02-16 ENCOUNTER — Encounter: Payer: Managed Care, Other (non HMO) | Admitting: Cardiology

## 2011-02-23 ENCOUNTER — Ambulatory Visit: Payer: Self-pay | Admitting: Cardiology

## 2011-03-06 ENCOUNTER — Other Ambulatory Visit: Payer: Self-pay | Admitting: Internal Medicine

## 2011-03-06 ENCOUNTER — Other Ambulatory Visit: Payer: Self-pay | Admitting: Cardiology

## 2011-03-06 DIAGNOSIS — R0989 Other specified symptoms and signs involving the circulatory and respiratory systems: Secondary | ICD-10-CM

## 2011-03-07 ENCOUNTER — Encounter (INDEPENDENT_AMBULATORY_CARE_PROVIDER_SITE_OTHER): Payer: Managed Care, Other (non HMO) | Admitting: *Deleted

## 2011-03-07 DIAGNOSIS — I6529 Occlusion and stenosis of unspecified carotid artery: Secondary | ICD-10-CM

## 2011-03-07 DIAGNOSIS — R0989 Other specified symptoms and signs involving the circulatory and respiratory systems: Secondary | ICD-10-CM

## 2011-03-09 LAB — CBC
HCT: 35.7 — ABNORMAL LOW
HCT: 36.2
HCT: 41.6
Hemoglobin: 12.4
Hemoglobin: 12.4
MCHC: 34.5
MCV: 88.9
MCV: 89.7
Platelets: 270
Platelets: 334
RBC: 3.98
RBC: 4.01
RDW: 13.1
RDW: 13.2
WBC: 7.8
WBC: 7.8

## 2011-03-09 LAB — I-STAT 8, (EC8 V) (CONVERTED LAB)
Acid-Base Excess: 1
Bicarbonate: 25.4 — ABNORMAL HIGH
Potassium: 3.6
Sodium: 136
TCO2: 27
pH, Ven: 7.42 — ABNORMAL HIGH

## 2011-03-09 LAB — CK TOTAL AND CKMB (NOT AT ARMC)
Relative Index: 11.8 — ABNORMAL HIGH
Relative Index: 8.2 — ABNORMAL HIGH

## 2011-03-09 LAB — PROTIME-INR: Prothrombin Time: 12.6

## 2011-03-09 LAB — BASIC METABOLIC PANEL
BUN: 13
BUN: 9
CO2: 28
Chloride: 101
Chloride: 106
GFR calc non Af Amer: >60
Glucose, Bld: 118 — ABNORMAL HIGH
Potassium: 3.4 — ABNORMAL LOW
Potassium: 3.5
Sodium: 141

## 2011-03-09 LAB — LIPID PANEL
Cholesterol: 212 — ABNORMAL HIGH
LDL Cholesterol: 147 — ABNORMAL HIGH
Triglycerides: 146

## 2011-03-09 LAB — TROPONIN I
Troponin I: 15.77
Troponin I: 28.78
Troponin I: 47.5

## 2011-03-09 LAB — POCT I-STAT CREATININE
Creatinine, Ser: 0.8
Operator id: 282671

## 2011-03-09 LAB — COMPREHENSIVE METABOLIC PANEL
ALT: 37 — ABNORMAL HIGH
Alkaline Phosphatase: 71
BUN: 13
Chloride: 105
Glucose, Bld: 108 — ABNORMAL HIGH
Potassium: 3.8
Sodium: 140
Total Bilirubin: 0.7
Total Protein: 6.1

## 2011-03-09 LAB — TSH: TSH: 2.224

## 2011-03-09 LAB — DIFFERENTIAL
Basophils Absolute: 0.1
Eosinophils Absolute: 0.3
Eosinophils Relative: 3

## 2011-03-09 LAB — POCT CARDIAC MARKERS: Troponin i, poc: 0.17 — ABNORMAL HIGH

## 2011-06-04 ENCOUNTER — Other Ambulatory Visit: Payer: Self-pay | Admitting: Cardiology

## 2011-06-22 ENCOUNTER — Other Ambulatory Visit: Payer: Self-pay | Admitting: Internal Medicine

## 2011-06-26 NOTE — Telephone Encounter (Signed)
Yes rf 3

## 2011-06-27 NOTE — Telephone Encounter (Signed)
Verbal refill provided to pharmacist Byrd Hesselbach at [place] [phone].

## 2011-09-28 ENCOUNTER — Ambulatory Visit (INDEPENDENT_AMBULATORY_CARE_PROVIDER_SITE_OTHER): Payer: Managed Care, Other (non HMO) | Admitting: Cardiology

## 2011-09-28 VITALS — BP 140/80 | HR 59 | Ht 61.0 in | Wt 158.0 lb

## 2011-09-28 DIAGNOSIS — I10 Essential (primary) hypertension: Secondary | ICD-10-CM

## 2011-09-28 DIAGNOSIS — I2510 Atherosclerotic heart disease of native coronary artery without angina pectoris: Secondary | ICD-10-CM

## 2011-09-28 DIAGNOSIS — E785 Hyperlipidemia, unspecified: Secondary | ICD-10-CM

## 2011-09-28 NOTE — Patient Instructions (Signed)
Your physician wants you to follow-up in: 6 MONTHS.  You will receive a reminder letter in the mail two months in advance. If you don't receive a letter, please call our office to schedule the follow-up appointment.  Your physician recommends that you continue on your current medications as directed. Please refer to the Current Medication list given to you today.  

## 2011-09-28 NOTE — Progress Notes (Signed)
HPI:  She is in for follow up.  In general she is doing well.  She denies major complaints.  She is in class a lot.    Current Outpatient Prescriptions  Medication Sig Dispense Refill  . Acetylcarnitine HCl (ACETYL L-CARNITINE) 250 MG CAPS Take by mouth. 400 mg every other day       . ALPRAZolam (XANAX) 0.25 MG tablet take 1 tablet by mouth at bedtime if needed  30 tablet  3  . aspirin 81 MG tablet Take 81 mg by mouth 2 (two) times daily.        Marland Kitchen b complex vitamins tablet Take 1 tablet by mouth daily.        . Biotin 300 MCG TABS Take 600 mcg by mouth every other day.        . Calcium-Magnesium-Zinc 500-250-12.5 MG TABS Take 1 tablet by mouth every other day.        . Chromium Picolinate 200 MCG TABS Take 1 tablet by mouth every other day.        . Cinnamon 500 MG capsule Take 1,000 mg by mouth daily.        . Coenzyme Q10 (COQ10) 200 MG CAPS Take 1 capsule by mouth daily.        Marland Kitchen CRANBERRY JUICE EXTRACT PO Take 500 mg by mouth daily.        . Flaxseed, Linseed, (FLAX SEED OIL) 1000 MG CAPS Take 1,200 mg by mouth daily.        . Garlic Oil 500 MG TABS Take 1 tablet by mouth daily.        Chilton Si Tea, Camillia sinensis, 315 MG CAPS Take 1 capsule by mouth daily.        Marland Kitchen LIPITOR 80 MG tablet Take 1 tablet (80 mg total) by mouth daily.  90 tablet  3  . metoprolol tartrate (LOPRESSOR) 25 MG tablet take 1/2 tablet by mouth twice a day  90 tablet  3  . nitroGLYCERIN (NITROSTAT) 0.4 MG SL tablet Place 0.4 mg under the tongue every 5 (five) minutes as needed.        . Omega-3 Fatty Acids (FISH OIL) 1200 MG CAPS Take 1 capsule by mouth daily.        Marland Kitchen PLAVIX 75 MG tablet take 1 tablet by mouth once daily  90 tablet  3  . Rhodiola 300 MG CAPS Take 1 capsule by mouth daily.          Allergies  Allergen Reactions  . Chlorpromazine Hcl   . Codeine     REACTION: causes vomiting  . Hydrocodone     REACTION: causes vomiing and nausea  . Hydrocodone-Acetaminophen   . Penicillins     REACTION:  causes rash  . Prochlorperazine Edisylate   . Promethazine Hcl   . Sulfonamide Derivatives     Past Medical History  Diagnosis Date  . Chronic insomnia   . Perirectal abscess   . Colon polyp   . Anxiety   . Depression   . Hypertension   . CAD (coronary artery disease)   . Heart attack 1-09  . Hyperlipidemia     Past Surgical History  Procedure Date  . Cesarean section   . Ptca with stent to third obtuse marginal     Family History  Problem Relation Age of Onset  . Cancer Mother     skin  . Allergy (severe) Father     History   Social History  .  Marital Status: Married    Spouse Name: N/A    Number of Children: 2  . Years of Education: N/A   Occupational History  . housewife    Social History Main Topics  . Smoking status: Former Smoker -- 2.0 packs/day for 20 years    Types: Cigarettes    Quit date: 06/19/2006  . Smokeless tobacco: Never Used  . Alcohol Use: No  . Drug Use: No  . Sexually Active: Not on file   Other Topics Concern  . Not on file   Social History Narrative   Resides in [place] with her husband. Husband works in [place] and comes home two to three times a year2 children who go to [place] got accepted to [employer]-G[employer]ercise- NO    ROS: Please see the HPI.  All other systems reviewed and negative.  PHYSICAL EXAM:  BP 140/80  Pulse 59  Ht 5\' 1"  (1.549 m)  Wt 158 lb (71.668 kg)  BMI 29.85 kg/m2  General: Well developed, well nourished, in no acute distress. Head:  Normocephalic and atraumatic. Neck: no JVD Lungs: Clear to auscultation and percussion. Heart: Normal S1 and S2.  No murmur, rubs or gallops.  Abdomen:  Normal bowel sounds; soft; non tender; no organomegaly Pulses: Pulses normal in all 4 extremities. Extremities: No clubbing or cyanosis. No edema. Neurologic: Alert and oriented x 3.  EKG: SB.  Otherwise normal.    ASSESSMENT AND PLAN:

## 2011-09-30 ENCOUNTER — Encounter: Payer: Self-pay | Admitting: Cardiology

## 2011-09-30 NOTE — Assessment & Plan Note (Signed)
Borderline.  Will defer to her primary care MD.

## 2011-09-30 NOTE — Assessment & Plan Note (Signed)
On aggressive lipid lowering therapy with LDL in the range of 90 on 80mg  atorvastatin.  Could add zetia, but no proven data and results of IMPROVE IT are outstanding.  No role for CTEP trial.   Would continue all efforts at diet and statin therapy.

## 2011-09-30 NOTE — Assessment & Plan Note (Signed)
She appears stable at the present time.  No current complaints.  No chest pain.  Not smoking.

## 2011-10-06 ENCOUNTER — Encounter: Payer: Self-pay | Admitting: *Deleted

## 2011-10-06 NOTE — Telephone Encounter (Signed)
This encounter was created in error - please disregard.

## 2011-11-01 ENCOUNTER — Encounter: Payer: Self-pay | Admitting: *Deleted

## 2011-11-01 NOTE — Telephone Encounter (Signed)
This encounter was created in error - please disregard.

## 2012-03-02 ENCOUNTER — Other Ambulatory Visit: Payer: Self-pay | Admitting: Cardiology

## 2012-03-07 ENCOUNTER — Telehealth: Payer: Self-pay | Admitting: Cardiology

## 2012-03-07 NOTE — Telephone Encounter (Signed)
changed script to 90 day supply

## 2012-03-07 NOTE — Telephone Encounter (Signed)
New Problem:    Called in to request 90 day refills of the patient's PLAVIX 75 MG tablet and metoprolol tartrate (LOPRESSOR) 25 MG tablet.

## 2012-03-26 ENCOUNTER — Telehealth: Payer: Self-pay | Admitting: Cardiology

## 2012-03-26 ENCOUNTER — Other Ambulatory Visit: Payer: Self-pay | Admitting: Cardiology

## 2012-03-26 MED ORDER — METOPROLOL TARTRATE 25 MG PO TABS: ORAL_TABLET | ORAL | Status: DC

## 2012-03-26 MED ORDER — CLOPIDOGREL BISULFATE 75 MG PO TABS: 75.0000 mg | ORAL_TABLET | Freq: Every day | ORAL | Status: DC

## 2012-03-26 NOTE — Telephone Encounter (Addendum)
Pt needs refill [facility] [phone] metoprolol 25mg , pt out didn't realize she had no refills, would like 90 days supply, pls call when called in [phone]

## 2012-03-26 NOTE — Telephone Encounter (Signed)
I spoke with the pt and refilled her Metoprolol Tartrate and Plavix per her request.  Follow-up appointment also scheduled.

## 2012-04-11 ENCOUNTER — Ambulatory Visit: Payer: Self-pay | Admitting: Obstetrics and Gynecology

## 2012-05-29 ENCOUNTER — Ambulatory Visit: Payer: Self-pay | Admitting: Obstetrics and Gynecology

## 2012-08-20 ENCOUNTER — Encounter: Payer: Self-pay | Admitting: Cardiology

## 2012-08-20 ENCOUNTER — Ambulatory Visit (INDEPENDENT_AMBULATORY_CARE_PROVIDER_SITE_OTHER): Payer: BC Managed Care – PPO | Admitting: Cardiology

## 2012-08-20 VITALS — BP 134/86 | HR 78 | Ht 61.4 in | Wt 160.0 lb

## 2012-08-20 DIAGNOSIS — R011 Cardiac murmur, unspecified: Secondary | ICD-10-CM

## 2012-08-20 NOTE — Assessment & Plan Note (Signed)
Modest periodic elevation.  She is to record and let us know or her primary.

## 2012-08-20 NOTE — Progress Notes (Signed)
HPI:  This nice lady returns today in a followup visit. From a cardiac standpoint she continues to get along well. We had nearly 30 minute discussion regarding dual antiplatelet therapy, and the pros and cons of treatment. Specifically, she is well beyond the guidelines her own timeframe, we talked about discontinuation. She's had a long going fear of stopping the medication, but I reviewed with her somewhat most recent data on these topics. She's not had any ongoing specific complaints, although recently her blood pressure was substantially higher during a day of rather significant personal stress.  Current Outpatient Prescriptions  Medication Sig Dispense Refill  . Acetylcarnitine HCl (ACETYL L-CARNITINE) 250 MG CAPS Take by mouth. 400 mg every other day       . ALPRAZolam (XANAX) 0.25 MG tablet take 1 tablet by mouth at bedtime if needed  30 tablet  3  . aspirin 81 MG tablet Take 81 mg by mouth 2 (two) times daily.        Marland Kitchen b complex vitamins tablet Take 1 tablet by mouth daily.        . Biotin 300 MCG TABS Take 600 mcg by mouth every other day.        . Calcium-Magnesium-Zinc 500-250-12.5 MG TABS Take 1 tablet by mouth every other day.        . Chromium Picolinate 200 MCG TABS Take 1 tablet by mouth every other day.        . Cinnamon 500 MG capsule Take 1,000 mg by mouth daily.        . clopidogrel (PLAVIX) 75 MG tablet Take 1 tablet (75 mg total) by mouth daily.  90 tablet  3  . Coenzyme Q10 (COQ10) 200 MG CAPS Take 1 capsule by mouth daily.        Marland Kitchen CRANBERRY JUICE EXTRACT PO Take 500 mg by mouth daily.        . Flaxseed, Linseed, (FLAX SEED OIL) 1000 MG CAPS Take 1,200 mg by mouth daily.        . Garlic Oil 500 MG TABS Take 1 tablet by mouth daily.        Chilton Si Tea, Camillia sinensis, 315 MG CAPS Take 1 capsule by mouth daily.        Marland Kitchen LIPITOR 80 MG tablet Take 1 tablet (80 mg total) by mouth daily.  90 tablet  3  . metoprolol tartrate (LOPRESSOR) 25 MG tablet Take one-half tablet by  mouth twice a day  90 tablet  3  . nitroGLYCERIN (NITROSTAT) 0.4 MG SL tablet Place 0.4 mg under the tongue every 5 (five) minutes as needed.        . Omega-3 Fatty Acids (FISH OIL) 1200 MG CAPS Take 1 capsule by mouth daily.        . Rhodiola 300 MG CAPS Take 1 capsule by mouth daily.         No current facility-administered medications for this visit.    Allergies  Allergen Reactions  . Chlorpromazine Hcl   . Codeine     REACTION: causes vomiting  . Hydrocodone     REACTION: causes vomiing and nausea  . Hydrocodone-Acetaminophen   . Penicillins     REACTION: causes rash  . Prochlorperazine Edisylate   . Promethazine Hcl   . Sulfonamide Derivatives     Past Medical History  Diagnosis Date  . Chronic insomnia   . Perirectal abscess   . Colon polyp   . Anxiety   . Depression   .  Hypertension   . CAD (coronary artery disease)   . Heart attack 1-09  . Hyperlipidemia     Past Surgical History  Procedure Laterality Date  . Cesarean section    . Ptca with stent to third obtuse marginal      Family History  Problem Relation Age of Onset  . Cancer Mother     skin  . Allergy (severe) Father     History   Social History  . Marital Status: Married    Spouse Name: N/A    Number of Children: 2  . Years of Education: N/A   Occupational History  . housewife    Social History Main Topics  . Smoking status: Former Smoker -- 2.00 packs/day for 20 years    Types: Cigarettes    Quit date: 06/19/2006  . Smokeless tobacco: Never Used  . Alcohol Use: No  . Drug Use: No  . Sexually Active: Not on file   Other Topics Concern  . Not on file   Social History Narrative   Resides in [place] with her husband.    Husband works in [place] and comes home two to three times a year   2 children who go to [place]   Just got accepted to [employer]   Exercise- NO    ROS: Please see the HPI.  All other systems reviewed and negative.  PHYSICAL EXAM:  BP 134/86  Pulse 78  Ht 5'  1.4" (1.56 m)  Wt 160 lb (72.576 kg)  BMI 29.82 kg/m2  SpO2 97%  General: Well developed, well nourished, in no acute distress. Head:  Normocephalic and atraumatic. Neck: no JVD Lungs: Clear to auscultation and percussion. Heart: Normal S1 and S2.  1-2/6 SEM.  No DM.   Pulses: Pulses normal in all 4 extremities. Extremities: No clubbing or cyanosis. No edema. Neurologic: Alert and oriented x 3.  EKG:  NSR.  Prior IMI.    ASSESSMENT AND PLAN:

## 2012-08-20 NOTE — Assessment & Plan Note (Signed)
Likely related to her HTN, but will check 2D echo.

## 2012-08-20 NOTE — Patient Instructions (Addendum)
Your physician wants you to follow-up in: 6 months with Dr Shirlee Latch.  You will receive a reminder letter in the mail two months in advance. If you don't receive a letter, please call our office to schedule the follow-up appointment.  Your physician has requested that you have an echocardiogram. Echocardiography is a painless test that uses sound waves to create images of your heart. It provides your doctor with information about the size and shape of your heart and how well your heart's chambers and valves are working. This procedure takes approximately one hour. There are no restrictions for this procedure.  Call Lauren, Dr Rosalyn Charters nurse, and let her know if you decide to stop or continue your Plavix  Your physician recommends that you return for lab work in: tomorrow (fasting lipid and liver)

## 2012-08-20 NOTE — Assessment & Plan Note (Signed)
She is overdo for lipid and liver profile.

## 2012-08-20 NOTE — Assessment & Plan Note (Signed)
She remains stable.  I would favor DC of plavix as she has a BMS and is well beyond the guidelines.  I counseled her carefully about the risks and benefits, and she will consider stopping.

## 2012-08-21 ENCOUNTER — Other Ambulatory Visit (INDEPENDENT_AMBULATORY_CARE_PROVIDER_SITE_OTHER): Payer: BC Managed Care – PPO

## 2012-08-21 DIAGNOSIS — E785 Hyperlipidemia, unspecified: Secondary | ICD-10-CM

## 2012-08-21 LAB — HEPATIC FUNCTION PANEL
Albumin: 3.8 g/dL (ref 3.5–5.2)
Bilirubin, Direct: 0.1 mg/dL (ref 0.0–0.3)
Total Protein: 6.9 g/dL (ref 6.0–8.3)

## 2012-08-21 LAB — LIPID PANEL
HDL: 43.1 mg/dL (ref >39.00)
Triglycerides: 236 mg/dL — ABNORMAL HIGH (ref 0.0–149.0)

## 2012-08-21 LAB — LDL CHOLESTEROL, DIRECT: Direct LDL: 168.3 mg/dL

## 2012-08-28 ENCOUNTER — Ambulatory Visit (HOSPITAL_COMMUNITY): Payer: BC Managed Care – PPO | Attending: Cardiovascular Disease

## 2012-08-28 DIAGNOSIS — E785 Hyperlipidemia, unspecified: Secondary | ICD-10-CM | POA: Insufficient documentation

## 2012-08-28 DIAGNOSIS — Z87891 Personal history of nicotine dependence: Secondary | ICD-10-CM | POA: Insufficient documentation

## 2012-08-28 DIAGNOSIS — I359 Nonrheumatic aortic valve disorder, unspecified: Secondary | ICD-10-CM | POA: Insufficient documentation

## 2012-08-28 DIAGNOSIS — I079 Rheumatic tricuspid valve disease, unspecified: Secondary | ICD-10-CM | POA: Insufficient documentation

## 2012-08-28 DIAGNOSIS — I10 Essential (primary) hypertension: Secondary | ICD-10-CM | POA: Insufficient documentation

## 2012-08-28 DIAGNOSIS — I2510 Atherosclerotic heart disease of native coronary artery without angina pectoris: Secondary | ICD-10-CM | POA: Insufficient documentation

## 2012-08-28 DIAGNOSIS — I252 Old myocardial infarction: Secondary | ICD-10-CM | POA: Insufficient documentation

## 2012-08-28 DIAGNOSIS — I059 Rheumatic mitral valve disease, unspecified: Secondary | ICD-10-CM | POA: Insufficient documentation

## 2012-08-28 DIAGNOSIS — I379 Nonrheumatic pulmonary valve disorder, unspecified: Secondary | ICD-10-CM | POA: Insufficient documentation

## 2012-08-28 DIAGNOSIS — R011 Cardiac murmur, unspecified: Secondary | ICD-10-CM | POA: Insufficient documentation

## 2012-08-28 NOTE — Progress Notes (Signed)
Echocardiogram performed.  

## 2012-08-30 ENCOUNTER — Other Ambulatory Visit: Payer: Self-pay | Admitting: *Deleted

## 2012-09-04 ENCOUNTER — Telehealth: Payer: Self-pay | Admitting: *Deleted

## 2012-09-04 NOTE — Telephone Encounter (Signed)
Second  Message left  for patient to call and schedule lipid Clinic appointment ordered by Dr. [name].

## 2012-09-09 ENCOUNTER — Telehealth: Payer: Self-pay | Admitting: *Deleted

## 2012-09-09 NOTE — Telephone Encounter (Signed)
Left message for patient to call and schedule lipid clinic appointment per Dr. [name].

## 2012-09-12 ENCOUNTER — Telehealth: Payer: Self-pay | Admitting: *Deleted

## 2012-09-12 NOTE — Telephone Encounter (Signed)
There has been several attempts to get in contact with Emily [name] to set up Lipid Clinic appointment  Ordered by Dr. [name]. Patient has not returned any of my phone calls. I will forward this message to Lauren. Maybe the patient is out of town.

## 2012-09-13 ENCOUNTER — Telehealth: Payer: Self-pay | Admitting: Cardiology

## 2012-09-13 NOTE — Telephone Encounter (Signed)
I spoke with the pt and made her aware of echo results.

## 2012-09-13 NOTE — Telephone Encounter (Signed)
New Problem:    Patient called in wanting to know the results of her latest ECHO.  Please call back.

## 2012-09-20 ENCOUNTER — Ambulatory Visit (INDEPENDENT_AMBULATORY_CARE_PROVIDER_SITE_OTHER): Payer: BC Managed Care – PPO | Admitting: Pharmacist

## 2012-09-20 VITALS — Wt 157.2 lb

## 2012-09-20 DIAGNOSIS — E785 Hyperlipidemia, unspecified: Secondary | ICD-10-CM

## 2012-09-20 NOTE — Patient Instructions (Signed)
Continue Lipitor 80mg  daily.  If you start having any problems, call Kennon Rounds at [phone].  Set an exercise goal of 45 min of walking on the treadmill (goal of 1.5-2 miles at a time) at least 3 days per week.  Recheck labs in 6 weeks.

## 2012-09-23 NOTE — Assessment & Plan Note (Signed)
Pt's cholesterol elevated while off Lipitor.  TC- 247, TG- 236, HDL- 43, LDL- 168.  LFTs are WNL.  Pt has already restarted Lipitor and has not had any tolerability problems.  Will continue Lipitor for now and then re-evaluate LDL.  Discussed lifestyle improvements.  Pt willing to start an exercise routine.  Will set a goal to walk on the treadmill at least 3 days per week.  Will follow up in 6 weeks (8 weeks total of Lipitor).

## 2012-09-23 NOTE — Progress Notes (Signed)
HPI  [name] is a 62 yo F who was referred to the Lipid Clinic by Dr. [name].  She has a history of CAD s/p stent placement to third obtuse marginal.  She has previously been treated with Lipitor but stopped because she felt like it was it was causing acid reflux.  She would have reflux only 1-2 times a week.  She would describe it as bile in her throat at night and the only way it would improve was to throw up.  She did not want to take reflux medication so she just stopped her Lipitor.  After she got her lab results from this past month, she did restart her Lipitor.  She has been taking it for about 2 weeks and not had any episodes of reflux since then.  Pt is a Consulting civil engineer at the [employer]  She is a former smoker.   Diet:  Breakfast- pt usually skips breakfast because she feels like it causes acid reflux.  She will have 1/2 of a Coke with her vitamins.  She has started going out to eat with her husband on the weekends for breakfast.  Lunch- cup of cereal (Quaker cinnamon squares) and light and fit yogurt or oatmeal Dinner- salad with peppers, onions, cucumbers, black olives, boiled shrimp, cubed ham, chicken.  Splurge on pizza 2x a month.  She does not like vegetables but can tolerate broccoli. She does try to eat dinner by 5pm each night.   She does not snack on a regular basis but will eat chips occasionally.   She will have an alcoholic beverage 2x/mo.  Exercise:  Pt does not have a consistent exercise routine.  She does have a treadmill at home and enjoys walking but has not made it a priority.   Current Outpatient Prescriptions  Medication Sig Dispense Refill  . Acetylcarnitine HCl (ACETYL L-CARNITINE) 250 MG CAPS Take by mouth. 400 mg every other day       . ALPRAZolam (XANAX) 0.25 MG tablet take 1 tablet by mouth at bedtime if needed  30 tablet  3  . aspirin 81 MG tablet Take 81 mg by mouth 2 (two) times daily.        Marland Kitchen b complex vitamins tablet Take 1 tablet by mouth  daily.        . Biotin 300 MCG TABS Take 600 mcg by mouth every other day.        . Calcium-Magnesium-Zinc 500-250-12.5 MG TABS Take 1 tablet by mouth every other day.        . Chromium Picolinate 200 MCG TABS Take 1 tablet by mouth every other day.        . Cinnamon 500 MG capsule Take 1,000 mg by mouth daily.        . clopidogrel (PLAVIX) 75 MG tablet Take 1 tablet (75 mg total) by mouth daily.  90 tablet  3  . Coenzyme Q10 (COQ10) 200 MG CAPS Take 1 capsule by mouth daily.        Marland Kitchen CRANBERRY JUICE EXTRACT PO Take 500 mg by mouth daily.        . Flaxseed, Linseed, (FLAX SEED OIL) 1000 MG CAPS Take 1,200 mg by mouth daily.        . Garlic Oil 500 MG TABS Take 1 tablet by mouth daily.        Chilton Si Tea, Camillia sinensis, 315 MG CAPS Take 1 capsule by mouth daily.        Marland Kitchen  LIPITOR 80 MG tablet Take 1 tablet (80 mg total) by mouth daily.  90 tablet  3  . metoprolol tartrate (LOPRESSOR) 25 MG tablet Take one-half tablet by mouth twice a day  90 tablet  3  . nitroGLYCERIN (NITROSTAT) 0.4 MG SL tablet Place 0.4 mg under the tongue every 5 (five) minutes as needed.        . Omega-3 Fatty Acids (FISH OIL) 1200 MG CAPS Take 1 capsule by mouth daily.        . Rhodiola 300 MG CAPS Take 1 capsule by mouth daily.         No current facility-administered medications for this visit.    Allergies  Allergen Reactions  . Chlorpromazine Hcl   . Codeine     REACTION: causes vomiting  . Hydrocodone     REACTION: causes vomiing and nausea  . Hydrocodone-Acetaminophen   . Penicillins     REACTION: causes rash  . Prochlorperazine Edisylate   . Promethazine Hcl   . Sulfonamide Derivatives

## 2012-10-07 ENCOUNTER — Other Ambulatory Visit: Payer: Self-pay | Admitting: *Deleted

## 2012-10-07 MED ORDER — LIPITOR 80 MG PO TABS: 80.0000 mg | ORAL_TABLET | Freq: Every day | ORAL | Status: DC

## 2012-10-09 ENCOUNTER — Telehealth: Payer: Self-pay | Admitting: Cardiology

## 2012-10-09 MED ORDER — ATORVASTATIN CALCIUM 80 MG PO TABS: 80.0000 mg | ORAL_TABLET | Freq: Every day | ORAL | Status: DC

## 2012-10-09 NOTE — Telephone Encounter (Signed)
Rx sent electronically to pharmacy for Atorvastatin. I also spoke with the pharmacy and made them aware that the pt can have generic.

## 2012-10-09 NOTE — Telephone Encounter (Signed)
New Problem:    Patient called in returning your call regarding going to the generic for lipitor.  Patient stated that it is ok to be placed on the generic and would like for you to call and give the ok to her pharmacy.  Please call back if you have any questions.

## 2012-11-05 ENCOUNTER — Other Ambulatory Visit (INDEPENDENT_AMBULATORY_CARE_PROVIDER_SITE_OTHER): Payer: BC Managed Care – PPO

## 2012-11-05 DIAGNOSIS — E785 Hyperlipidemia, unspecified: Secondary | ICD-10-CM

## 2012-11-05 LAB — LIPID PANEL
LDL Cholesterol: 109 mg/dL — ABNORMAL HIGH (ref 0–99)
VLDL: 27.8 mg/dL (ref 0.0–40.0)

## 2012-11-05 LAB — HEPATIC FUNCTION PANEL
Alkaline Phosphatase: 83 U/L (ref 39–117)
Bilirubin, Direct: 0 mg/dL (ref 0.0–0.3)
Total Bilirubin: 0.6 mg/dL (ref 0.3–1.2)

## 2012-11-08 ENCOUNTER — Ambulatory Visit: Payer: BC Managed Care – PPO | Admitting: Pharmacist

## 2012-11-18 ENCOUNTER — Encounter: Payer: Self-pay | Admitting: Internal Medicine

## 2012-12-04 ENCOUNTER — Encounter: Payer: Self-pay | Admitting: Internal Medicine

## 2012-12-06 ENCOUNTER — Ambulatory Visit (INDEPENDENT_AMBULATORY_CARE_PROVIDER_SITE_OTHER): Payer: BC Managed Care – PPO | Admitting: Pharmacist

## 2012-12-06 DIAGNOSIS — E785 Hyperlipidemia, unspecified: Secondary | ICD-10-CM

## 2012-12-06 NOTE — Patient Instructions (Signed)
Your lab results look much better!  Keep up the good work.  Continue taking Lipitor 80mg  once daily  Keep monitoring your calories and exercise on your trip.  Follow up in August.

## 2012-12-09 NOTE — Progress Notes (Signed)
HPI  Emily [name] is a 62 yo F who was referred to the Lipid Clinic by Dr. [name].  She has a history of CAD s/p stent placement to third obtuse marginal.  At last visit she was started back on Lipitor 80mg  daily.  She states she has not had any acid reflux since taking it in the AM.  She is still concerned about potential memory problems although she does not report any of these herself.  She is getting ready to go on a 12 day cruise to [place].  Pt is a Consulting civil engineer at the [employer]  She is a former smoker.   Diet and Exercise:  Pt has not made any large changes in her diet.  She is hoping that her upcoming trip will help her get back on track.  She has purchased a Psychologist, clinical that acts as a pedometer and calorie counter.  She has also started a diet diary to track her calorie intake.    Current Outpatient Prescriptions  Medication Sig Dispense Refill  . Acetylcarnitine HCl (ACETYL L-CARNITINE) 250 MG CAPS Take by mouth. 400 mg every other day       . ALPRAZolam (XANAX) 0.25 MG tablet take 1 tablet by mouth at bedtime if needed  30 tablet  3  . aspirin 81 MG tablet Take 81 mg by mouth 2 (two) times daily.        Marland Kitchen atorvastatin (LIPITOR) 80 MG tablet Take 1 tablet (80 mg total) by mouth daily.  90 tablet  3  . b complex vitamins tablet Take 1 tablet by mouth daily.        . Biotin 300 MCG TABS Take 600 mcg by mouth every other day.        . Calcium-Magnesium-Zinc 500-250-12.5 MG TABS Take 1 tablet by mouth every other day.        . Chromium Picolinate 200 MCG TABS Take 1 tablet by mouth every other day.        . Cinnamon 500 MG capsule Take 1,000 mg by mouth daily.        . clopidogrel (PLAVIX) 75 MG tablet Take 1 tablet (75 mg total) by mouth daily.  90 tablet  3  . Coenzyme Q10 (COQ10) 200 MG CAPS Take 1 capsule by mouth daily.        Marland Kitchen CRANBERRY JUICE EXTRACT PO Take 500 mg by mouth daily.        . Flaxseed, Linseed, (FLAX SEED OIL) 1000 MG CAPS Take 1,200 mg by mouth daily.         . Garlic Oil 500 MG TABS Take 1 tablet by mouth daily.        Chilton Si Tea, Camillia sinensis, 315 MG CAPS Take 1 capsule by mouth daily.        . metoprolol tartrate (LOPRESSOR) 25 MG tablet Take one-half tablet by mouth twice a day  90 tablet  3  . nitroGLYCERIN (NITROSTAT) 0.4 MG SL tablet Place 0.4 mg under the tongue every 5 (five) minutes as needed.        . Omega-3 Fatty Acids (FISH OIL) 1200 MG CAPS Take 1 capsule by mouth daily.        . Rhodiola 300 MG CAPS Take 1 capsule by mouth daily.         No current facility-administered medications for this visit.    Allergies  Allergen Reactions  . Chlorpromazine Hcl   . Codeine     REACTION:  causes vomiting  . Hydrocodone     REACTION: causes vomiing and nausea  . Hydrocodone-Acetaminophen   . Penicillins     REACTION: causes rash  . Prochlorperazine Edisylate   . Promethazine Hcl   . Sulfonamide Derivatives

## 2012-12-09 NOTE — Assessment & Plan Note (Signed)
Pt's cholesterol much improved with addition of Lipitor.  TC- 189, TG- 139, HDL- 52, LDL- 109.  ALT slightly increased so will continue to monitor.  Pt trying to make lifestyle changes.  Will follow up in 2 months per pts request to evaluate effect of lifestyle changes.

## 2012-12-13 ENCOUNTER — Telehealth: Payer: Self-pay | Admitting: Cardiology

## 2012-12-13 MED ORDER — NITROGLYCERIN 0.4 MG SL SUBL: 0.4000 mg | SUBLINGUAL_TABLET | SUBLINGUAL | Status: DC | PRN

## 2012-12-13 NOTE — Telephone Encounter (Signed)
New Prob     Pt is going to [place] in a few weeks and is wanting a new prescription of her NITRO. Pt is not due to see new provider until September and had a visit with Dr. [name] in March of this year. Please call.

## 2012-12-13 NOTE — Telephone Encounter (Signed)
I spoke with the pt and she has not used NTG but would like a fresh supply to take on her trip.  The pt would like a Rx sent to pharmacy.  Rx sent per pt request.

## 2013-01-21 ENCOUNTER — Other Ambulatory Visit: Payer: BC Managed Care – PPO

## 2013-01-28 ENCOUNTER — Ambulatory Visit (INDEPENDENT_AMBULATORY_CARE_PROVIDER_SITE_OTHER): Payer: BC Managed Care – PPO | Admitting: *Deleted

## 2013-01-28 DIAGNOSIS — E785 Hyperlipidemia, unspecified: Secondary | ICD-10-CM

## 2013-01-28 DIAGNOSIS — I10 Essential (primary) hypertension: Secondary | ICD-10-CM

## 2013-01-28 DIAGNOSIS — I2510 Atherosclerotic heart disease of native coronary artery without angina pectoris: Secondary | ICD-10-CM

## 2013-01-28 LAB — LIPID PANEL
HDL: 47.7 mg/dL (ref >39.00)
LDL Cholesterol: 93 mg/dL (ref 0–99)
Total CHOL/HDL Ratio: 4
Triglycerides: 198 mg/dL — ABNORMAL HIGH (ref 0.0–149.0)

## 2013-01-28 LAB — HEPATIC FUNCTION PANEL: Albumin: 3.9 g/dL (ref 3.5–5.2)

## 2013-01-30 ENCOUNTER — Ambulatory Visit (INDEPENDENT_AMBULATORY_CARE_PROVIDER_SITE_OTHER): Payer: BC Managed Care – PPO | Admitting: Pharmacist

## 2013-01-30 DIAGNOSIS — E785 Hyperlipidemia, unspecified: Secondary | ICD-10-CM

## 2013-01-30 NOTE — Assessment & Plan Note (Addendum)
Pt's LDL has been steadily improving. Plan is to continue atorvastatin 80mg  daily.  Based on pt's hx of CAD and stent placement, would like LDL closer to 70. While crestor is more potent, it is more expensive and probably not necessary at this point. Patient was encouraged to exercise and continue with healthy diet. She confirms understanding and is willing to do so. Recheck labs in 3 months (December)  Pt seen and reviewed with Weston Brass, PharmD, CPP

## 2013-01-30 NOTE — Progress Notes (Deleted)
[name] is a 61 yo F with history of CAD s/p stent placement to third obtuse marginal, here for follow up of her hyperlipidemia. After previously stopping her atorvastatin due to symptoms of GERD, she restarted atorvastatin 80 mg at the end of April. She also recently returned from a July cruise in [place], in which she states having issues with swelling in the hands and feet. However, that has resolved after returning. She presents wanting to know how much she has improved and whether or not her dose could be decreased due to potential memory loss, though she has not experienced any significant signs.  TC 180 (goal <200, down from 189), TG 198 (goal <150, up from 139), HDL 47 (goal >50, down from 50) LDL 93 (goal <70, down from 109)  Diet: She states her diet has not changed much, and has been trying to eat healthier. She uses her fit bit and phone app to count calories/track sleep (<1200 kcal/day), was disappointed when she did not lose as much weight as she expected on her active trip. She keeps a fairly consistent diet, but did express various splurges of pizza at times.  Exercise: She states a lack of exercise and is waiting for a time when "she feels ready" and self-motivated. She wants to stay healthy, but has found it difficult to find time. She is a student at [employer] and looks forward to being more active when school starts    

## 2013-01-30 NOTE — Patient Instructions (Signed)
Continue Lipitor 80mg  daily.  Great job on your calorie counting.    Try to exercise as much as possible.   Follow up in 4 months.

## 2013-02-08 NOTE — Progress Notes (Signed)
Emily [name] is a 62 yo F with history of CAD s/p stent placement to third obtuse marginal, here for follow up of her hyperlipidemia. After previously stopping her atorvastatin due to symptoms of GERD, she restarted atorvastatin 80 mg at the end of April. She also recently returned from a July cruise in [place], in which she states having issues with swelling in the hands and feet. However, that has resolved after returning. She presents wanting to know how much she has improved and whether or not her dose could be decreased due to potential memory loss, though she has not experienced any significant signs.  TC 180 (goal <200, down from 189), TG 198 (goal <150, up from 139), HDL 47 (goal >50, down from 50) LDL 93 (goal <70, down from 109)  Diet: She states her diet has not changed much, and has been trying to eat healthier. She uses her fit bit and phone app to count calories/track sleep (<1200 kcal/day), was disappointed when she did not lose as much weight as she expected on her active trip. She keeps a fairly consistent diet, but did express various splurges of pizza at times.  Exercise: She states a lack of exercise and is waiting for a time when "she feels ready" and self-motivated. She wants to stay healthy, but has found it difficult to find time. She is a Consulting civil engineer at [employer] and looks forward to being more active when school starts

## 2013-03-11 ENCOUNTER — Other Ambulatory Visit: Payer: Self-pay

## 2013-03-11 MED ORDER — METOPROLOL TARTRATE 25 MG PO TABS: ORAL_TABLET | ORAL | Status: DC

## 2013-05-06 ENCOUNTER — Other Ambulatory Visit: Payer: Self-pay

## 2013-05-06 MED ORDER — CLOPIDOGREL BISULFATE 75 MG PO TABS: 75.0000 mg | ORAL_TABLET | Freq: Every day | ORAL | Status: DC

## 2013-05-23 ENCOUNTER — Other Ambulatory Visit (INDEPENDENT_AMBULATORY_CARE_PROVIDER_SITE_OTHER): Payer: BC Managed Care – PPO

## 2013-05-23 DIAGNOSIS — E785 Hyperlipidemia, unspecified: Secondary | ICD-10-CM

## 2013-05-23 LAB — HEPATIC FUNCTION PANEL
ALT: 25 U/L (ref 0–35)
AST: 22 U/L (ref 0–37)
Alkaline Phosphatase: 84 U/L (ref 39–117)
Bilirubin, Direct: 0.1 mg/dL (ref 0.0–0.3)
Total Protein: 6.8 g/dL (ref 6.0–8.3)

## 2013-05-23 LAB — LIPID PANEL: Cholesterol: 171 mg/dL (ref 0–200)

## 2013-05-27 ENCOUNTER — Encounter (INDEPENDENT_AMBULATORY_CARE_PROVIDER_SITE_OTHER): Payer: Self-pay

## 2013-05-27 ENCOUNTER — Ambulatory Visit (INDEPENDENT_AMBULATORY_CARE_PROVIDER_SITE_OTHER): Payer: BC Managed Care – PPO | Admitting: Pharmacist

## 2013-05-27 VITALS — Ht 61.0 in | Wt 155.0 lb

## 2013-05-27 DIAGNOSIS — E785 Hyperlipidemia, unspecified: Secondary | ICD-10-CM

## 2013-05-27 NOTE — Patient Instructions (Signed)
Continue your current medications.  Your labs look good today.   Recheck labs in 6 months.

## 2013-05-29 ENCOUNTER — Encounter: Payer: Self-pay | Admitting: Cardiology

## 2013-05-29 ENCOUNTER — Ambulatory Visit (INDEPENDENT_AMBULATORY_CARE_PROVIDER_SITE_OTHER): Payer: BC Managed Care – PPO | Admitting: Cardiology

## 2013-05-29 VITALS — BP 122/60 | HR 63 | Ht 61.0 in | Wt 155.0 lb

## 2013-05-29 DIAGNOSIS — I351 Nonrheumatic aortic (valve) insufficiency: Secondary | ICD-10-CM

## 2013-05-29 DIAGNOSIS — E785 Hyperlipidemia, unspecified: Secondary | ICD-10-CM

## 2013-05-29 DIAGNOSIS — I2510 Atherosclerotic heart disease of native coronary artery without angina pectoris: Secondary | ICD-10-CM

## 2013-05-29 DIAGNOSIS — I359 Nonrheumatic aortic valve disorder, unspecified: Secondary | ICD-10-CM

## 2013-05-29 NOTE — Progress Notes (Signed)
Patient ID: Emily [name], female   DOB: [birth year], 62 y.o.   MRN: [id number] PCP: Dr. Rodena Medin  62 yo with history of CAD s/p inferior MI in 1/09 treated with PCI to OM3.  She has had no cardiac events since that time.  She has been seen by Dr. [name] in the past and is seen by me for the first time today.  Echo in 3/14 showed preserved EF with mild to moderate aortic insufficiency.  Symptomatically, she has been doing well.  She is taking classes at [employer].  No exertional chest pain and no exertional dyspnea.  No orthopnea/PND.  She has been on Plavix long-term since PCI.   ECG: NSR, normal  Labs (12/14): LDL 90, HDL 41  PMH: 1. CAD: Inferior MI in 1/09 with culprit vessel a large OM3.  This was treated with PCI.  Had residual 50-70% mLAD stenosis.  Echo (3/14) with EF 60-65%, mild-moderate AI 2. HTN 3. H/o fen-phen use 4. Aortic insufficiency: Mild to moderate on echo in 3/14.  5. Carotid dopplers (9/12) with mild plaque.   SH: Married with 2 children.  She is taking classes at [employer]  Husband retired.  Quit smoking in 9/08.    FH: No CAD.  Mother with hemorrhagic CVA.  ROS: All systems reviewed and negative except as per HPI.   Current Outpatient Prescriptions  Medication Sig Dispense Refill  . Acetylcarnitine HCl (ACETYL L-CARNITINE) 250 MG CAPS Take by mouth. 400 mg every other day       . ALPRAZolam (XANAX) 0.25 MG tablet take 1 tablet by mouth at bedtime if needed  30 tablet  3  . aspirin 81 MG tablet Take 81 mg by mouth 2 (two) times daily.        Marland Kitchen atorvastatin (LIPITOR) 80 MG tablet Take 1 tablet (80 mg total) by mouth daily.  90 tablet  3  . b complex vitamins tablet Take 1 tablet by mouth daily.        . Biotin 300 MCG TABS Take 600 mcg by mouth every other day.        . Calcium-Magnesium-Zinc 500-250-12.5 MG TABS Take 1 tablet by mouth every other day.        . Chromium Picolinate 200 MCG TABS Take 1 tablet by mouth every other day.        . Cinnamon 500 MG capsule Take  1,000 mg by mouth daily.        . clopidogrel (PLAVIX) 75 MG tablet Take 1 tablet (75 mg total) by mouth daily.  90 tablet  3  . Coenzyme Q10 (COQ10) 200 MG CAPS Take 1 capsule by mouth daily.        Marland Kitchen CRANBERRY JUICE EXTRACT PO Take 500 mg by mouth daily.        . Flaxseed, Linseed, (FLAX SEED OIL) 1000 MG CAPS Take 1,200 mg by mouth daily.        . Garlic Oil 500 MG TABS Take 1 tablet by mouth daily.        Chilton Si Tea, Camillia sinensis, 315 MG CAPS Take 1 capsule by mouth daily.        . metoprolol tartrate (LOPRESSOR) 25 MG tablet Take one-half tablet by mouth twice a day  90 tablet  0  . nitroGLYCERIN (NITROSTAT) 0.4 MG SL tablet Place 1 tablet (0.4 mg total) under the tongue every 5 (five) minutes as needed.  25 tablet  2  . Omega-3 Fatty Acids (FISH OIL) 1200 MG  CAPS Take 1 capsule by mouth daily.        . Rhodiola 300 MG CAPS Take 1 capsule by mouth daily.         No current facility-administered medications for this visit.    BP 122/60  Pulse 63  Ht 5\' 1"  (1.549 m)  Wt 70.308 kg (155 lb)  BMI 29.30 kg/m2 General: NAD Neck: No JVD, no thyromegaly or thyroid nodule.  Lungs: Clear to auscultation bilaterally with normal respiratory effort. CV: Nondisplaced PMI.  Heart regular S1/S2, no S3/S4, 1/6 SEM.  No peripheral edema.  No carotid bruit.  Normal pedal pulses.  Abdomen: Soft, nontender, no hepatosplenomegaly, no distention.  Skin: Intact without lesions or rashes.  Neurologic: Alert and oriented x 3.  Psych: Normal affect. Extremities: No clubbing or cyanosis.   Assessment/Plan: 1. CAD: Stable with no ischemic symptoms.  She had PCI in 1/09 in the setting of inferior MI.  She had residual untreated 50-70% mLAD stenosis.   - Continue ASA 81, statin, metoprolol. - She has been on Plavix long-term with no evidence for GI bleeding.  Given the results of the recent DAPT trial, I think it is reasonable to continue Plavix.  2. Hyperlipidemia: Acceptable lipids in 12/14.  I would  like to see LDL a bit lower.  I encouraged work on diet and exercise.  She is already on atorvastatin 80 mg daily.  3. Aortic insufficiency: Mild to moderate on 3/14 echo.  Repeat echo in 2 years.  BP is under control.    Marca Ancona 05/29/2013

## 2013-05-29 NOTE — Patient Instructions (Signed)
Your physician wants you to follow-up in: 6 months with Dr Shirlee Latch. (June 2015).  You will receive a reminder letter in the mail two months in advance. If you don't receive a letter, please call our office to schedule the follow-up appointment.

## 2013-05-30 NOTE — Assessment & Plan Note (Addendum)
Pt's cholesterol remains stable.  TC- 171, TG- 165, HDL- 48, LDL- 90.  LFTs are WNL.  LDL still slightly above goal but on max dose of Lipitor and has achieved >50% reduction.  Will continue current therapy for now.  Pt motivated to exercise.  Hopefully this will decrease LDL more.  Will follow up in 6 months.

## 2013-05-30 NOTE — Progress Notes (Signed)
Emily [name] is a 62 yo F with history of CAD s/p stent placement to third obtuse marginal, here for follow up of her hyperlipidemia. After previously stopping her atorvastatin due to symptoms of GERD, she restarted atorvastatin 80 mg at the end of April. She states she has not had any problems tolerating this.  She just finished taking her finals for school this semester.   Diet: She states her diet has not changed much, and has been trying to eat healthier. She uses her fit bit and phone app to count calories/track sleep (<1200 kcal/day).  She usually only eats 2 meals per day but does stay below 1200 kcal/day most days.   Exercise: Pt states she is finally ready to exercise.  Her husband has lost ~45lbs within the past year and is going to the gym multiple times of the week.  She states she doesn't want to "be the fatty of the family" so she wants to start being more active.    Current Outpatient Prescriptions  Medication Sig Dispense Refill  . Acetylcarnitine HCl (ACETYL L-CARNITINE) 250 MG CAPS Take by mouth. 400 mg every other day       . ALPRAZolam (XANAX) 0.25 MG tablet take 1 tablet by mouth at bedtime if needed  30 tablet  3  . aspirin 81 MG tablet Take 81 mg by mouth 2 (two) times daily.        Marland Kitchen atorvastatin (LIPITOR) 80 MG tablet Take 1 tablet (80 mg total) by mouth daily.  90 tablet  3  . b complex vitamins tablet Take 1 tablet by mouth daily.        . Biotin 300 MCG TABS Take 600 mcg by mouth every other day.        . Calcium-Magnesium-Zinc 500-250-12.5 MG TABS Take 1 tablet by mouth every other day.        . Chromium Picolinate 200 MCG TABS Take 1 tablet by mouth every other day.        . Cinnamon 500 MG capsule Take 1,000 mg by mouth daily.        . clopidogrel (PLAVIX) 75 MG tablet Take 1 tablet (75 mg total) by mouth daily.  90 tablet  3  . Coenzyme Q10 (COQ10) 200 MG CAPS Take 1 capsule by mouth daily.        Marland Kitchen CRANBERRY JUICE EXTRACT PO Take 500 mg by mouth daily.         . Flaxseed, Linseed, (FLAX SEED OIL) 1000 MG CAPS Take 1,200 mg by mouth daily.        . Garlic Oil 500 MG TABS Take 1 tablet by mouth daily.        Chilton Si Tea, Camillia sinensis, 315 MG CAPS Take 1 capsule by mouth daily.        . metoprolol tartrate (LOPRESSOR) 25 MG tablet Take one-half tablet by mouth twice a day  90 tablet  0  . nitroGLYCERIN (NITROSTAT) 0.4 MG SL tablet Place 1 tablet (0.4 mg total) under the tongue every 5 (five) minutes as needed.  25 tablet  2  . Omega-3 Fatty Acids (FISH OIL) 1200 MG CAPS Take 1 capsule by mouth daily.        . Rhodiola 300 MG CAPS Take 1 capsule by mouth daily.         No current facility-administered medications for this visit.

## 2013-07-04 ENCOUNTER — Encounter: Payer: Self-pay | Admitting: Physician Assistant

## 2013-07-04 ENCOUNTER — Ambulatory Visit (INDEPENDENT_AMBULATORY_CARE_PROVIDER_SITE_OTHER): Payer: BC Managed Care – PPO | Admitting: Physician Assistant

## 2013-07-04 VITALS — BP 128/86 | HR 86 | Temp 98.4°F | Resp 18 | Ht 61.0 in | Wt 157.5 lb

## 2013-07-04 DIAGNOSIS — J209 Acute bronchitis, unspecified: Secondary | ICD-10-CM

## 2013-07-04 MED ORDER — FLUTICASONE PROPIONATE 50 MCG/ACT NA SUSP
2.0000 | Freq: Every day | NASAL | Status: DC
Start: 2013-07-04 — End: 2013-09-12

## 2013-07-04 MED ORDER — LEVOFLOXACIN 500 MG PO TABS: 500.0000 mg | ORAL_TABLET | Freq: Every day | ORAL | Status: DC

## 2013-07-04 NOTE — Patient Instructions (Signed)
Increase fluid intake.  Rest.  Saline nasal spray.  Place humidifier in the bedroom.  Take plain Mucinex.  Use Flonase as directed.  Take antibiotic until all pills are gone.  Take a daily probiotic.  Please call or return to clinic if symptoms are not improving.

## 2013-07-04 NOTE — Progress Notes (Signed)
Pre visit review using our clinic review tool, if applicable. No additional management support is needed unless otherwise documented below in the visit note/SLS  

## 2013-07-06 DIAGNOSIS — J209 Acute bronchitis, unspecified: Secondary | ICD-10-CM | POA: Insufficient documentation

## 2013-07-06 NOTE — Assessment & Plan Note (Signed)
Rx Levaquin. Increase fluid intake. Rest. Saline nasal spray. Delsym for cough. Plain Mucinex. Probiotic. Place humidifier in bedroom. Please call or return to clinic if symptoms not improving.

## 2013-07-06 NOTE — Progress Notes (Signed)
Patient presents to clinic today complaining of several days of chest congestion, cough is productive of yellow-green phlegm, chills and intermittent fevers. Patient was seen at an urgent care center after onset of symptoms. Flu swab at that time was negative. Patient states her symptoms have worsened daily. Endorses scratchy throat. Denies shortness of breath or wheezing. Does endorse some sinus pressure but denies sinus pain. Denies tooth pain. Denies recent travel or sick contact.  Past Medical History  Diagnosis Date  . Chronic insomnia   . Perirectal abscess   . Colon polyp   . Anxiety   . Depression   . Hypertension   . CAD (coronary artery disease)   . Heart attack 1-09  . Hyperlipemia     Current Outpatient Prescriptions on File Prior to Visit  Medication Sig Dispense Refill  . Acetylcarnitine HCl (ACETYL L-CARNITINE) 250 MG CAPS Take by mouth. 400 mg every other day       . ALPRAZolam (XANAX) 0.25 MG tablet take 1 tablet by mouth at bedtime if needed  30 tablet  3  . aspirin 81 MG tablet Take 81 mg by mouth 2 (two) times daily.        Marland Kitchen atorvastatin (LIPITOR) 80 MG tablet Take 1 tablet (80 mg total) by mouth daily.  90 tablet  3  . b complex vitamins tablet Take 1 tablet by mouth daily.        . Biotin 300 MCG TABS Take 600 mcg by mouth every other day.        . Calcium-Magnesium-Zinc 500-250-12.5 MG TABS Take 1 tablet by mouth every other day.        . Chromium Picolinate 200 MCG TABS Take 1 tablet by mouth every other day.        . Cinnamon 500 MG capsule Take 1,000 mg by mouth daily.        . clopidogrel (PLAVIX) 75 MG tablet Take 1 tablet (75 mg total) by mouth daily.  90 tablet  3  . Coenzyme Q10 (COQ10) 200 MG CAPS Take 1 capsule by mouth daily.        Marland Kitchen CRANBERRY JUICE EXTRACT PO Take 500 mg by mouth daily.        . Flaxseed, Linseed, (FLAX SEED OIL) 1000 MG CAPS Take 1,200 mg by mouth daily.        . Garlic Oil 578 MG TABS Take 1 tablet by mouth daily.        Nyoka Cowden  Tea, Camillia sinensis, 315 MG CAPS Take 1 capsule by mouth daily.        . metoprolol tartrate (LOPRESSOR) 25 MG tablet Take one-half tablet by mouth twice a day  90 tablet  0  . nitroGLYCERIN (NITROSTAT) 0.4 MG SL tablet Place 1 tablet (0.4 mg total) under the tongue every 5 (five) minutes as needed.  25 tablet  2  . Omega-3 Fatty Acids (FISH OIL) 1200 MG CAPS Take 1 capsule by mouth daily.        . Rhodiola 300 MG CAPS Take 1 capsule by mouth daily.         No current facility-administered medications on file prior to visit.    Allergies  Allergen Reactions  . Chlorpromazine Hcl   . Codeine     REACTION: causes vomiting  . Hydrocodone     REACTION: causes vomiing and nausea  . Hydrocodone-Acetaminophen   . Penicillins     REACTION: causes rash  . Prochlorperazine Edisylate   .  Promethazine Hcl   . Sulfonamide Derivatives     Family History  Problem Relation Age of Onset  . Cancer Mother     skin  . Allergy (severe) Father     History   Social History  . Marital Status: Married    Spouse Name: N/A    Number of Children: 2  . Years of Education: N/A   Occupational History  . housewife    Social History Main Topics  . Smoking status: Former Smoker -- 2.00 packs/day for 20 years    Types: Cigarettes    Quit date: 06/19/2006  . Smokeless tobacco: Never Used  . Alcohol Use: No  . Drug Use: No  . Sexual Activity: None   Other Topics Concern  . None   Social History Narrative   Resides in [place] with her husband.    Husband works in [place] and comes home two to three times a year   2 children who go to [employer]   Just got accepted to [employer]   Exercise- NO    Review of Systems - See HPI.  All other ROS are negative.  Filed Vitals:   07/04/13 1438  BP: 128/86  Pulse: 86  Temp: 98.4 F (36.9 C)  Resp: 18   Physical Exam  Constitutional: She is oriented to person, place, and time and well-developed, well-nourished, and in no distress.  HENT:  Head:  Normocephalic and atraumatic.  Right Ear: External ear normal.  Left Ear: External ear normal.  Nose: Nose normal.  Mouth/Throat: Oropharynx is clear and moist. No oropharyngeal exudate.  Tympanic membranes within normal limits bilaterally. No tenderness to percussion of sinuses noted on examination.  Eyes: Conjunctivae are normal.  Neck: Neck supple.  Cardiovascular: Normal rate and regular rhythm.   Murmur heard. Pulmonary/Chest: Effort normal and breath sounds normal. No respiratory distress. She has no wheezes. She has no rales. She exhibits tenderness.  Lymphadenopathy:    She has no cervical adenopathy.  Neurological: She is alert and oriented to person, place, and time. No cranial nerve deficit.  Skin: Skin is warm and dry. No rash noted.  Psychiatric: Affect normal.    Recent Results (from the past 2160 hour(s))  LIPID PANEL     Status: Abnormal   Collection Time    05/23/13 10:38 AM      Result Value Range   Cholesterol 171  0 - 200 mg/dL   Comment: ATP III Classification       Desirable:  < 200 mg/dL               Borderline High:  200 - 239 mg/dL          High:  > = 240 mg/dL   Triglycerides 165.0 (*) 0.0 - 149.0 mg/dL   Comment: Normal:  <150 mg/dLBorderline High:  150 - 199 mg/dL   HDL 47.70  >39.00 mg/dL   VLDL 33.0  0.0 - 40.0 mg/dL   LDL Cholesterol 90  0 - 99 mg/dL   Total CHOL/HDL Ratio 4     Comment:                Men          Women1/2 Average Risk     3.4          3.3Average Risk          5.0          4.42X Average Risk  9.6          7.13X Average Risk          15.0          11.0                      HEPATIC FUNCTION PANEL     Status: None   Collection Time    05/23/13 10:38 AM      Result Value Range   Total Bilirubin 1.0  0.3 - 1.2 mg/dL   Bilirubin, Direct 0.1  0.0 - 0.3 mg/dL   Alkaline Phosphatase 84  39 - 117 U/L   AST 22  0 - 37 U/L   ALT 25  0 - 35 U/L   Total Protein 6.8  6.0 - 8.3 g/dL   Albumin 3.9  3.5 - 5.2 g/dL     Assessment/Plan: Acute bronchitis Rx Levaquin. Increase fluid intake. Rest. Saline nasal spray. Delsym for cough. Plain Mucinex. Probiotic. Place humidifier in bedroom. Please call or return to clinic if symptoms not improving.

## 2013-07-07 ENCOUNTER — Ambulatory Visit (INDEPENDENT_AMBULATORY_CARE_PROVIDER_SITE_OTHER): Payer: BC Managed Care – PPO | Admitting: Physician Assistant

## 2013-07-07 ENCOUNTER — Encounter: Payer: Self-pay | Admitting: Physician Assistant

## 2013-07-07 VITALS — BP 122/76 | HR 75 | Temp 98.1°F | Resp 16 | Ht 61.0 in | Wt 158.0 lb

## 2013-07-07 DIAGNOSIS — G4700 Insomnia, unspecified: Secondary | ICD-10-CM

## 2013-07-07 DIAGNOSIS — J209 Acute bronchitis, unspecified: Secondary | ICD-10-CM

## 2013-07-07 MED ORDER — ALPRAZOLAM 0.25 MG PO TABS: ORAL_TABLET | ORAL | Status: DC

## 2013-07-07 MED ORDER — CIPROFLOXACIN HCL 500 MG PO TABS: 500.0000 mg | ORAL_TABLET | Freq: Two times a day (BID) | ORAL | Status: DC

## 2013-07-07 MED ORDER — ALBUTEROL SULFATE HFA 108 (90 BASE) MCG/ACT IN AERS: 2.0000 | INHALATION_SPRAY | Freq: Four times a day (QID) | RESPIRATORY_TRACT | Status: DC | PRN

## 2013-07-07 NOTE — Patient Instructions (Signed)
Increase fluid intake.  Use albuterol inhaler as needed.  Xanax at bedtime for sleep.  Continue increasing fluid intake, humidifier in bedroom, Mucinex and saline nasal spray.

## 2013-07-07 NOTE — Progress Notes (Signed)
Pre visit review using our clinic review tool, if applicable. No additional management support is needed unless otherwise documented below in the visit note/SLS  

## 2013-07-10 NOTE — Assessment & Plan Note (Signed)
Physical exam relatively unchanged from previous examination. Instructed patient that she needs to give the Levaquin a fair shot. Patient is very adamant in having ciprofloxacin prescribed to her for her symptoms. Although Cipro is not an initial choice for respiratory infection, it should provide adequate coverage. DC Levaquin. Rx ciprofloxacin. Encourage Mucinex and Delsym. Stop the Flonase. Use saline nasal spray instead. Please humidifier in the bedroom. Patient instructed that it will take a few days for her symptoms to resolve, even with antibiotic therapy. Cough is usually the last symptom to dissipate.

## 2013-07-10 NOTE — Progress Notes (Signed)
Patient presents to clinic today stating her symptoms are not improving. Patient was seen a couple of days ago and diagnosed with acute bronchitis. Patient given prescription for Levaquin. Patient endorses taking prescription as prescribed. Patient states she still has a nonproductive cough, fever and fatigue. Patient is afebrile in clinic. Patient was afebrile at last visit as well. Patient states she's not tolerating the Flonase well. States it is blocking her sinuses. Patient is requesting a switch in anabiotic. Patient states she's been on ciprofloxacin for bronchitis before, and "that is the only thing that helps". Patient is also resting something for cough, has been keeping her up at night. Patient is allergic to most ingredients of prescription cough medication. Has not tried over-the-counter cough medicine. Patient denies shortness of breath and wheezing. Denies sick contact or recent travel.  Past Medical History  Diagnosis Date  . Chronic insomnia   . Perirectal abscess   . Colon polyp   . Anxiety   . Depression   . Hypertension   . CAD (coronary artery disease)   . Heart attack 1-09  . Hyperlipemia     Current Outpatient Prescriptions on File Prior to Visit  Medication Sig Dispense Refill  . Acetylcarnitine HCl (ACETYL L-CARNITINE) 250 MG CAPS Take by mouth. 400 mg every other day       . aspirin 81 MG tablet Take 81 mg by mouth 2 (two) times daily.        Marland Kitchen atorvastatin (LIPITOR) 80 MG tablet Take 1 tablet (80 mg total) by mouth daily.  90 tablet  3  . b complex vitamins tablet Take 1 tablet by mouth daily.        . Biotin 300 MCG TABS Take 600 mcg by mouth every other day.        . Calcium-Magnesium-Zinc 500-250-12.5 MG TABS Take 1 tablet by mouth every other day.        . Chromium Picolinate 200 MCG TABS Take 1 tablet by mouth every other day.        . Cinnamon 500 MG capsule Take 1,000 mg by mouth daily.        . clopidogrel (PLAVIX) 75 MG tablet Take 1 tablet (75 mg total)  by mouth daily.  90 tablet  3  . Coenzyme Q10 (COQ10) 200 MG CAPS Take 1 capsule by mouth daily.        Marland Kitchen CRANBERRY JUICE EXTRACT PO Take 500 mg by mouth daily.        . Flaxseed, Linseed, (FLAX SEED OIL) 1000 MG CAPS Take 1,200 mg by mouth daily.        . fluticasone (FLONASE) 50 MCG/ACT nasal spray Place 2 sprays into both nostrils daily.  16 g  6  . Garlic Oil 329 MG TABS Take 1 tablet by mouth daily.        Nyoka Cowden Tea, Camillia sinensis, 315 MG CAPS Take 1 capsule by mouth daily.        . metoprolol tartrate (LOPRESSOR) 25 MG tablet Take one-half tablet by mouth twice a day  90 tablet  0  . nitroGLYCERIN (NITROSTAT) 0.4 MG SL tablet Place 1 tablet (0.4 mg total) under the tongue every 5 (five) minutes as needed.  25 tablet  2  . Omega-3 Fatty Acids (FISH OIL) 1200 MG CAPS Take 1 capsule by mouth daily.        . Rhodiola 300 MG CAPS Take 1 capsule by mouth daily.  No current facility-administered medications on file prior to visit.    Allergies  Allergen Reactions  . Chlorpromazine Hcl   . Codeine     REACTION: causes vomiting  . Hydrocodone     REACTION: causes vomiing and nausea  . Hydrocodone-Acetaminophen   . Penicillins     REACTION: causes rash  . Prochlorperazine Edisylate   . Promethazine Hcl   . Sulfonamide Derivatives     Family History  Problem Relation Age of Onset  . Cancer Mother     skin  . Allergy (severe) Father     History   Social History  . Marital Status: Married    Spouse Name: N/A    Number of Children: 2  . Years of Education: N/A   Occupational History  . housewife    Social History Main Topics  . Smoking status: Former Smoker -- 2.00 packs/day for 20 years    Types: Cigarettes    Quit date: 06/19/2006  . Smokeless tobacco: Never Used  . Alcohol Use: No  . Drug Use: No  . Sexual Activity: None   Other Topics Concern  . None   Social History Narrative   Resides in [place] with her husband.    Husband works in [place]  and comes home two to three times a year   2 children who go to [place]   Just got accepted to [employer]   Exercise- NO    Review of Systems - See HPI.  All other ROS are negative.  Filed Vitals:   07/07/13 1107  BP: 122/76  Pulse: 75  Temp: 98.1 F (36.7 C)  Resp: 16   Physical Exam  Vitals reviewed. Constitutional: She is oriented to person, place, and time and well-developed, well-nourished, and in no distress.  HENT:  Head: Normocephalic and atraumatic.  Right Ear: External ear normal.  Left Ear: External ear normal.  Nose: Nose normal.  Mouth/Throat: Oropharynx is clear and moist. No oropharyngeal exudate.  Tympanic membranes within normal limits bilaterally. No tenderness noted on percussion of sinuses.  Eyes: Conjunctivae are normal. Pupils are equal, round, and reactive to light.  Neck: Neck supple.  Cardiovascular: Normal rate, regular rhythm, normal heart sounds and intact distal pulses.   Pulmonary/Chest: Effort normal and breath sounds normal. No respiratory distress. She has no wheezes. She has no rales. She exhibits no tenderness.  Lymphadenopathy:    She has no cervical adenopathy.  Neurological: She is alert and oriented to person, place, and time.  Skin: Skin is warm and dry. No rash noted.  Psychiatric: Affect normal.    Recent Results (from the past 2160 hour(s))  LIPID PANEL     Status: Abnormal   Collection Time    05/23/13 10:38 AM      Result Value Range   Cholesterol 171  0 - 200 mg/dL   Comment: ATP III Classification       Desirable:  < 200 mg/dL               Borderline High:  200 - 239 mg/dL          High:  > = 240 mg/dL   Triglycerides 165.0 (*) 0.0 - 149.0 mg/dL   Comment: Normal:  <150 mg/dLBorderline High:  150 - 199 mg/dL   HDL 47.70  >39.00 mg/dL   VLDL 33.0  0.0 - 40.0 mg/dL   LDL Cholesterol 90  0 - 99 mg/dL   Total CHOL/HDL Ratio 4     Comment:  Men          Women1/2 Average Risk     3.4          3.3Average Risk           5.0          4.42X Average Risk          9.6          7.13X Average Risk          15.0          11.0                      HEPATIC FUNCTION PANEL     Status: None   Collection Time    05/23/13 10:38 AM      Result Value Range   Total Bilirubin 1.0  0.3 - 1.2 mg/dL   Bilirubin, Direct 0.1  0.0 - 0.3 mg/dL   Alkaline Phosphatase 84  39 - 117 U/L   AST 22  0 - 37 U/L   ALT 25  0 - 35 U/L   Total Protein 6.8  6.0 - 8.3 g/dL   Albumin 3.9  3.5 - 5.2 g/dL    Assessment/Plan: No problem-specific assessment & plan notes found for this encounter.

## 2013-07-24 ENCOUNTER — Other Ambulatory Visit: Payer: Self-pay | Admitting: Cardiology

## 2013-09-12 ENCOUNTER — Encounter: Payer: Self-pay | Admitting: Physician Assistant

## 2013-09-12 ENCOUNTER — Ambulatory Visit (INDEPENDENT_AMBULATORY_CARE_PROVIDER_SITE_OTHER): Payer: BC Managed Care – PPO | Admitting: Physician Assistant

## 2013-09-12 VITALS — BP 143/76 | HR 101 | Temp 98.3°F | Resp 16 | Ht 61.0 in | Wt 159.5 lb

## 2013-09-12 DIAGNOSIS — J209 Acute bronchitis, unspecified: Secondary | ICD-10-CM

## 2013-09-12 MED ORDER — CIPROFLOXACIN HCL 500 MG PO TABS: 500.0000 mg | ORAL_TABLET | Freq: Two times a day (BID) | ORAL | Status: DC

## 2013-09-12 NOTE — Patient Instructions (Signed)
Please take antibiotic as prescribed.  Increase fluid intake.  Rest.  Saline nasal spray.  Take a daily probiotic -- [place]le, Align, Digestive Advantage, One A Day.  Plain Mucinex.  Delsym for cough.  Place a humidifier in the bedroom.  Call or return to clinic if symptoms are not improving.  Acute Bronchitis Bronchitis is inflammation of the airways that extend from the windpipe into the lungs (bronchi). The inflammation often causes mucus to develop. This leads to a cough, which is the most common symptom of bronchitis.  In acute bronchitis, the condition usually develops suddenly and goes away over time, usually in a couple weeks. Smoking, allergies, and asthma can make bronchitis worse. Repeated episodes of bronchitis may cause further lung problems.  CAUSES Acute bronchitis is most often caused by the same virus that causes a cold. The virus can spread from person to person (contagious).  SIGNS AND SYMPTOMS   Cough.   Fever.   Coughing up mucus.   Body aches.   Chest congestion.   Chills.   Shortness of breath.   Sore throat.  DIAGNOSIS  Acute bronchitis is usually diagnosed through a physical exam. Tests, such as chest X-rays, are sometimes done to rule out other conditions.  TREATMENT  Acute bronchitis usually goes away in a couple weeks. Often times, no medical treatment is necessary. Medicines are sometimes given for relief of fever or cough. Antibiotics are usually not needed but may be prescribed in certain situations. In some cases, an inhaler may be recommended to help reduce shortness of breath and control the cough. A cool mist vaporizer may also be used to help thin bronchial secretions and make it easier to clear the chest.  HOME CARE INSTRUCTIONS  Get plenty of rest.   Drink enough fluids to keep your urine clear or pale yellow (unless you have a medical condition that requires fluid restriction). Increasing fluids may help thin your secretions and will  prevent dehydration.   Only take over-the-counter or prescription medicines as directed by your health care provider.   Avoid smoking and secondhand smoke. Exposure to cigarette smoke or irritating chemicals will make bronchitis worse. If you are a smoker, consider using nicotine gum or skin patches to help control withdrawal symptoms. Quitting smoking will help your lungs heal faster.   Reduce the chances of another bout of acute bronchitis by washing your hands frequently, avoiding people with cold symptoms, and trying not to touch your hands to your mouth, nose, or eyes.   Follow up with your health care provider as directed.  SEEK MEDICAL CARE IF: Your symptoms do not improve after 1 week of treatment.  SEEK IMMEDIATE MEDICAL CARE IF:  You develop an increased fever or chills.   You have chest pain.   You have severe shortness of breath.  You have bloody sputum.   You develop dehydration.  You develop fainting.  You develop repeated vomiting.  You develop a severe headache. MAKE SURE YOU:   Understand these instructions.  Will watch your condition.  Will get help right away if you are not doing well or get worse. Document Released: 07/13/2004 Document Revised: 02/05/2013 Document Reviewed: 11/26/2012 [facility] Patient Information 2014 [place].

## 2013-09-12 NOTE — Progress Notes (Signed)
Patient presents to clinic today c/o productive cough, chest congestion, PND and fatigue. Patient also endorses intermittent fevers.  Denies shortness of breath, wheezing, ear pain, tooth pain, sinus pressure or sinus pain.  Patient denies recent travel or sick contact.  Past Medical History  Diagnosis Date  . Chronic insomnia   . Perirectal abscess   . Colon polyp   . Anxiety   . Depression   . Hypertension   . CAD (coronary artery disease)   . Heart attack 1-09  . Hyperlipemia     Current Outpatient Prescriptions on File Prior to Visit  Medication Sig Dispense Refill  . Acetylcarnitine HCl (ACETYL L-CARNITINE) 250 MG CAPS Take by mouth. 400 mg every other day       . albuterol (PROVENTIL HFA;VENTOLIN HFA) 108 (90 BASE) MCG/ACT inhaler Inhale 2 puffs into the lungs every 6 (six) hours as needed for wheezing or shortness of breath.  1 Inhaler  0  . ALPRAZolam (XANAX) 0.25 MG tablet take 1 tablet by mouth at bedtime if needed  30 tablet  0  . aspirin 81 MG tablet Take 81 mg by mouth 2 (two) times daily.        Marland Kitchen atorvastatin (LIPITOR) 80 MG tablet Take 1 tablet (80 mg total) by mouth daily.  90 tablet  3  . b complex vitamins tablet Take 1 tablet by mouth daily.        . Biotin 300 MCG TABS Take 600 mcg by mouth every other day.        . Calcium-Magnesium-Zinc 500-250-12.5 MG TABS Take 1 tablet by mouth every other day.        . Chromium Picolinate 200 MCG TABS Take 1 tablet by mouth every other day.        . Cinnamon 500 MG capsule Take 1,000 mg by mouth daily.        . clopidogrel (PLAVIX) 75 MG tablet Take 1 tablet (75 mg total) by mouth daily.  90 tablet  3  . Coenzyme Q10 (COQ10) 200 MG CAPS Take 1 capsule by mouth daily.        Marland Kitchen CRANBERRY JUICE EXTRACT PO Take 500 mg by mouth daily.        . Flaxseed, Linseed, (FLAX SEED OIL) 1000 MG CAPS Take 1,200 mg by mouth daily.        . Garlic Oil 644 MG TABS Take 1 tablet by mouth daily.        Nyoka Cowden Tea, Camillia sinensis, 315 MG CAPS  Take 1 capsule by mouth daily.        . metoprolol tartrate (LOPRESSOR) 25 MG tablet TAKE 1/2 TABLET BY MOUTH TWICE A DAY  90 tablet  0  . nitroGLYCERIN (NITROSTAT) 0.4 MG SL tablet Place 1 tablet (0.4 mg total) under the tongue every 5 (five) minutes as needed.  25 tablet  2  . Omega-3 Fatty Acids (FISH OIL) 1200 MG CAPS Take 1 capsule by mouth daily.        . Rhodiola 300 MG CAPS Take 1 capsule by mouth daily.         No current facility-administered medications on file prior to visit.    Allergies  Allergen Reactions  . Chlorpromazine Hcl   . Codeine     REACTION: causes vomiting  . Hydrocodone     REACTION: causes vomiing and nausea  . Hydrocodone-Acetaminophen   . Penicillins     REACTION: causes rash  . Prochlorperazine Edisylate   .  Promethazine Hcl   . Sulfonamide Derivatives     Family History  Problem Relation Age of Onset  . Cancer Mother     skin  . Allergy (severe) Father     History   Social History  . Marital Status: Married    Spouse Name: N/A    Number of Children: 2  . Years of Education: N/A   Occupational History  . housewife    Social History Main Topics  . Smoking status: Former Smoker -- 2.00 packs/day for 20 years    Types: Cigarettes    Quit date: 06/19/2006  . Smokeless tobacco: Never Used  . Alcohol Use: No  . Drug Use: No  . Sexual Activity: None   Other Topics Concern  . None   Social History Narrative   Resides in [place] with her husband.    Husband works in [place] and comes home two to three times a year   2 children who go to [employer]   Just got accepted to [employer]   Exercise- NO   Review of Systems - See HPI.  All other ROS are negative.  BP 143/76  Pulse 101  Temp(Src) 98.3 F (36.8 C) (Oral)  Resp 16  Ht 5\' 1"  (1.549 m)  Wt 159 lb 8 oz (72.349 kg)  BMI 30.15 kg/m2  SpO2 95%  Physical Exam  Vitals reviewed. Constitutional: She is oriented to person, place, and time and well-developed, well-nourished, and in no  distress.  HENT:  Head: Normocephalic and atraumatic.  Right Ear: External ear normal.  Left Ear: External ear normal.  Nose: Nose normal.  Mouth/Throat: Oropharynx is clear and moist. No oropharyngeal exudate.  TM within normal limits bilaterally.  No TTP of sinuses on exam.  Eyes: Conjunctivae are normal. Pupils are equal, round, and reactive to light.  Neck: Neck supple.  Cardiovascular: Normal rate, regular rhythm and intact distal pulses.   Chronic murmur.  Pulmonary/Chest: Effort normal and breath sounds normal. No respiratory distress. She has no wheezes. She has no rales. She exhibits no tenderness.  Lymphadenopathy:    She has no cervical adenopathy.  Neurological: She is alert and oriented to person, place, and time.  Skin: Skin is warm and dry. No rash noted.  Psychiatric: Affect normal.   Assessment/Plan: Acute bronchitis Rx Ciprofloxacin.  Increase fluid intake.  Rest.  Saline nasal spray.  Encourage daily Claritin and Flonase.  Plain Mucinex.  Humidifier in the bedroom.  Call or return to clinic if symptoms are not improving.

## 2013-09-12 NOTE — Assessment & Plan Note (Signed)
Rx Ciprofloxacin.  Increase fluid intake.  Rest.  Saline nasal spray.  Encourage daily Claritin and Flonase.  Plain Mucinex.  Humidifier in the bedroom.  Call or return to clinic if symptoms are not improving.

## 2013-10-01 ENCOUNTER — Encounter: Payer: Self-pay | Admitting: Internal Medicine

## 2013-10-23 ENCOUNTER — Other Ambulatory Visit: Payer: Self-pay

## 2013-10-23 MED ORDER — ATORVASTATIN CALCIUM 80 MG PO TABS: 80.0000 mg | ORAL_TABLET | Freq: Every day | ORAL | Status: DC

## 2013-11-06 ENCOUNTER — Encounter: Payer: Self-pay | Admitting: Cardiology

## 2013-11-09 ENCOUNTER — Other Ambulatory Visit: Payer: Self-pay | Admitting: Cardiology

## 2013-11-20 ENCOUNTER — Other Ambulatory Visit (INDEPENDENT_AMBULATORY_CARE_PROVIDER_SITE_OTHER): Payer: BC Managed Care – PPO

## 2013-11-20 DIAGNOSIS — E785 Hyperlipidemia, unspecified: Secondary | ICD-10-CM

## 2013-11-20 LAB — LIPID PANEL
CHOL/HDL RATIO: 4
Cholesterol: 188 mg/dL (ref 0–200)
HDL: 48.2 mg/dL (ref >39.00)
LDL Cholesterol: 106 mg/dL — ABNORMAL HIGH (ref 0–99)
NONHDL: 139.8
Triglycerides: 170 mg/dL — ABNORMAL HIGH (ref 0.0–149.0)
VLDL: 34 mg/dL (ref 0.0–40.0)

## 2013-11-20 LAB — HEPATIC FUNCTION PANEL
ALT: 30 U/L (ref 0–35)
AST: 28 U/L (ref 0–37)
Albumin: 4 g/dL (ref 3.5–5.2)
Alkaline Phosphatase: 91 U/L (ref 39–117)
BILIRUBIN DIRECT: 0.1 mg/dL (ref 0.0–0.3)
Total Bilirubin: 0.5 mg/dL (ref 0.2–1.2)
Total Protein: 6.6 g/dL (ref 6.0–8.3)

## 2013-11-25 ENCOUNTER — Ambulatory Visit (INDEPENDENT_AMBULATORY_CARE_PROVIDER_SITE_OTHER): Payer: BC Managed Care – PPO | Admitting: Pharmacist

## 2013-11-25 VITALS — Wt 159.0 lb

## 2013-11-25 DIAGNOSIS — Z79899 Other long term (current) drug therapy: Secondary | ICD-10-CM

## 2013-11-25 DIAGNOSIS — E785 Hyperlipidemia, unspecified: Secondary | ICD-10-CM

## 2013-11-25 NOTE — Progress Notes (Signed)
Emily [name] is a 63 yo F with history of CAD s/p stent placement to third obtuse marginal, here for follow up of her hyperlipidemia.  Patient's LDL has trended up from 90 to 106 mg/dL over past six months.  Patient has gained 5 lbs and has had a lot more stress over those past six months with this past semester at [employer].  She is concerned that her stress level and weight gain and caused this elevation in LDL.  She is very resistant to changing or adding prescription medication, and is very concerned about side effects coming from prescription meds.  I discussed changing her to Crestor 40 mg, which was Dr.McLean's recommendation, however she declines making this change at this time.  We also discussed Zetia addition since her previous LDL (90mg /dL) was also above goal.  She was also not interested in Zetia at this time either.   Her weight is up from 154 to 159 lbs.  This is the most she's ever weighed.  LDL goal preferably < 70.  Diet: She states her diet has not changed much, and has been trying to eat healthier. She uses her fit bit and phone app to count calories/track sleep (<1200 kcal/day).  She usually only eats 2 meals per day but does stay below 1200 kcal/day most days.   Exercise: Patient has not done anything for exercise in over a year now.  She agreed to do this last time she saw Gay Filler, however never started.  Since seeing her weight (159 lbs - heaviest she's ever been), and LDL going up to 106 mg/dL, she promises to start exercising now, and will do so 5 days per week for at least 30 minutes per day.  Goal weight loss 6 lbs in next 4 months.  Her husband has lost ~55lbs within the past year and is going to the gym multiple times of the week.    Labs:   11/2013:  TC 188, LDL 106, TG 170, HDL 48, LFTs normal (Lipitor 80 mg qd, fish oil)  Current Outpatient Prescriptions  Medication Sig Dispense Refill  . Acetylcarnitine HCl (ACETYL L-CARNITINE) 250 MG CAPS Take by mouth. 400 mg every other  day       . albuterol (PROVENTIL HFA;VENTOLIN HFA) 108 (90 BASE) MCG/ACT inhaler Inhale 2 puffs into the lungs every 6 (six) hours as needed for wheezing or shortness of breath.  1 Inhaler  0  . ALPRAZolam (XANAX) 0.25 MG tablet take 1 tablet by mouth at bedtime if needed  30 tablet  0  . aspirin 81 MG tablet Take 81 mg by mouth 2 (two) times daily.        Marland Kitchen atorvastatin (LIPITOR) 80 MG tablet Take 1 tablet (80 mg total) by mouth daily.  90 tablet  1  . b complex vitamins tablet Take 1 tablet by mouth daily.        . Biotin 300 MCG TABS Take 600 mcg by mouth every other day.        . Calcium-Magnesium-Zinc 500-250-12.5 MG TABS Take 1 tablet by mouth every other day.        . Chromium Picolinate 200 MCG TABS Take 1 tablet by mouth every other day.        . Cinnamon 500 MG capsule Take 1,000 mg by mouth daily.        . clopidogrel (PLAVIX) 75 MG tablet Take 1 tablet (75 mg total) by mouth daily.  90 tablet  3  . Coenzyme Q10 (  COQ10) 200 MG CAPS Take 1 capsule by mouth daily.        Marland Kitchen CRANBERRY JUICE EXTRACT PO Take 500 mg by mouth daily.        . Flaxseed, Linseed, (FLAX SEED OIL) 1000 MG CAPS Take 1,200 mg by mouth daily.        . Garlic Oil 481 MG TABS Take 1 tablet by mouth daily.        Nyoka Cowden Tea, Camillia sinensis, 315 MG CAPS Take 1 capsule by mouth daily.        . metoprolol tartrate (LOPRESSOR) 25 MG tablet TAKE 1/2 TABLET BY MOUTH 2 TIMES DAILY  90 tablet  0  . nitroGLYCERIN (NITROSTAT) 0.4 MG SL tablet Place 1 tablet (0.4 mg total) under the tongue every 5 (five) minutes as needed.  25 tablet  2  . Omega-3 Fatty Acids (FISH OIL) 1200 MG CAPS Take 1 capsule by mouth daily.        . Rhodiola 300 MG CAPS Take 1 capsule by mouth daily.         No current facility-administered medications for this visit.

## 2013-11-25 NOTE — Patient Instructions (Signed)
1.  Continue lipitor 80 mg once daily 2.  Need to start walking at least 5 days per week.  At least 30-45 minutes per day. 3.  Goal weight loss of at least 6 lbs over next 4 months.   4.  Continue low calorie / low cholesterol diet. 5.  Try to get back to getting at least 8 hours sleep per night. 6.  Could consider metamucil once daily in the evening- to lower cholesterol in a more natural way in addition to lipitor. 1 tablespoon mixed in 8 ounces of water, and drink once daily. 7.  Recheck lipid panel, blood sugar, and hepatic panel in 4 months (03/23/14 fasting labs), see Gay Filler the following day 03/24/14 at 2:00 pm

## 2013-11-25 NOTE — Assessment & Plan Note (Addendum)
Patient needs to lose 5-10 lbs and start exercising daily since she is not willing to change to Crestor or add Zetia to regimen.  She agrees to make this lifestyle change, and understands that if cholesterol hasn't improved in 4 months, that she will need to be put on a more aggressive LDL lowering regimen.  Hopefully with less stress, more sleep, and more exercise, she will be able to get a significant drop in LDL.  She is to continue her low fat / low cholesterol diet.  I suggested trying to add metamucil as she is into more natural therapeutic options, as this can get a 5-10% LDL reduction by binding cholesterol in the gut.  Will also check a CMET with her lipid panel to assess glucose in 4 months since she hasn't been following up with a PCP recently, and her weight is going up.   Plan: 1.  Continue lipitor 80 mg once daily 2.  Need to start walking at least 5 days per week.  At least 30-45 minutes per day. 3.  Goal weight loss of at least 6 lbs over next 4 months.   4.  Continue low calorie / low cholesterol diet. 5.  Try to get back to getting at least 8 hours sleep per night. 6.  Could consider metamucil once daily in the evening- to lower cholesterol in a more natural way in addition to lipitor. 1 tablespoon mixed in 8 ounces of water, and drink once daily. 7.  Recheck lipid panel, blood sugar, and hepatic panel in 4 months (03/23/14 fasting labs), see Gay Filler the following day 03/24/14 at 2:00 pm

## 2014-01-14 ENCOUNTER — Encounter: Payer: Self-pay | Admitting: *Deleted

## 2014-01-19 ENCOUNTER — Encounter: Payer: Self-pay | Admitting: Cardiology

## 2014-01-19 ENCOUNTER — Ambulatory Visit (INDEPENDENT_AMBULATORY_CARE_PROVIDER_SITE_OTHER): Payer: BC Managed Care – PPO | Admitting: Cardiology

## 2014-01-19 ENCOUNTER — Ambulatory Visit (HOSPITAL_COMMUNITY)
Admission: AD | Admit: 2014-01-19 | Discharge: 2014-01-19 | Disposition: A | Discharge status: Home / Self Care | Payer: BC Managed Care – PPO | Source: Ambulatory Visit | Attending: Cardiology | Admitting: Cardiology

## 2014-01-19 VITALS — BP 126/66 | HR 72 | Ht 61.0 in | Wt 169.0 lb

## 2014-01-19 DIAGNOSIS — I2510 Atherosclerotic heart disease of native coronary artery without angina pectoris: Secondary | ICD-10-CM

## 2014-01-19 DIAGNOSIS — I10 Essential (primary) hypertension: Secondary | ICD-10-CM | POA: Insufficient documentation

## 2014-01-19 DIAGNOSIS — Z7902 Long term (current) use of antithrombotics/antiplatelets: Secondary | ICD-10-CM | POA: Insufficient documentation

## 2014-01-19 DIAGNOSIS — I252 Old myocardial infarction: Secondary | ICD-10-CM | POA: Insufficient documentation

## 2014-01-19 DIAGNOSIS — G4700 Insomnia, unspecified: Secondary | ICD-10-CM

## 2014-01-19 DIAGNOSIS — E785 Hyperlipidemia, unspecified: Secondary | ICD-10-CM | POA: Insufficient documentation

## 2014-01-19 LAB — PLATELET INHIBITION P2Y12: Platelet Function  P2Y12: 85 [PRU] — ABNORMAL LOW (ref 194–418)

## 2014-01-19 MED ORDER — ALPRAZOLAM 0.25 MG PO TABS: ORAL_TABLET | ORAL | Status: DC

## 2014-01-19 MED ORDER — METOPROLOL TARTRATE 25 MG PO TABS: ORAL_TABLET | ORAL | Status: DC

## 2014-01-19 MED ORDER — NITROGLYCERIN 0.4 MG SL SUBL: 0.4000 mg | SUBLINGUAL_TABLET | SUBLINGUAL | Status: DC | PRN

## 2014-01-19 MED ORDER — ATORVASTATIN CALCIUM 80 MG PO TABS: 80.0000 mg | ORAL_TABLET | Freq: Every day | ORAL | Status: DC

## 2014-01-19 MED ORDER — CLOPIDOGREL BISULFATE 75 MG PO TABS: 75.0000 mg | ORAL_TABLET | Freq: Every day | ORAL | Status: DC

## 2014-01-19 NOTE — Patient Instructions (Signed)
Your physician recommends that you have lab work today--P2Y12. I have given you a prescription for this You will need to go to Admitting at [facility] and register. Then you will go to the Main Lab at [facility] floor. I spoke with Varney Biles at [facility].   I have given you a written prescription for Xanax for 30 days. Dr Aundra Dubin will not be able to provide additional refills for this.   Your physician wants you to follow-up in: 6 months with Dr Aundra Dubin. (February 2016).  You will receive a reminder letter in the mail two months in advance. If you don't receive a letter, please call our office to schedule the follow-up appointment.

## 2014-01-20 NOTE — Progress Notes (Signed)
Patient ID: Emily [name], female   DOB: [birth year], 63 y.o.   MRN: [id number] PCP: Dr. Elizebeth Koller  63 yo with history of CAD s/p inferior MI in 1/09 treated with PCI to OM3.  She has had no cardiac events since that time.   Echo in 3/14 showed preserved EF with mild to moderate aortic insufficiency.  Symptomatically, she has been stable.  She is taking classes at [employer].  She had a stressful semester and gained weight due to less exercise.  Weight is up 14 lbs.  No exertional chest pain and no exertional dyspnea.  No orthopnea/PND.  She has been on Plavix long-term since PCI.  She has stopped taking aspirin (no longer wants to take).    ECG: NSR, normal  Labs (12/14): LDL 90, HDL 41 Labs (6/15): LDL 106, HDL 48  PMH: 1. CAD: Inferior MI in 1/09 with culprit vessel a large OM3.  This was treated with PCI.  Had residual 50-70% mLAD stenosis.  Echo (3/14) with EF 60-65%, mild-moderate AI 2. HTN 3. H/o fen-phen use 4. Aortic insufficiency: Mild to moderate on echo in 3/14.  5. Carotid dopplers (9/12) with mild plaque.   SH: Married with 2 children.  She is taking classes at [employer].  Husband retired.  Quit smoking in 9/08.    FH: No CAD.  Mother with hemorrhagic CVA.  ROS: All systems reviewed and negative except as per HPI.   Current Outpatient Prescriptions  Medication Sig Dispense Refill  . Acetylcarnitine HCl (ACETYL L-CARNITINE) 250 MG CAPS Take by mouth. 400 mg every other day       . albuterol (PROVENTIL HFA;VENTOLIN HFA) 108 (90 BASE) MCG/ACT inhaler Inhale 2 puffs into the lungs every 6 (six) hours as needed for wheezing or shortness of breath.  1 Inhaler  0  . ALPRAZolam (XANAX) 0.25 MG tablet take 1 tablet by mouth at bedtime if needed  30 tablet  0  . atorvastatin (LIPITOR) 80 MG tablet Take 1 tablet (80 mg total) by mouth daily.  90 tablet  1  . b complex vitamins tablet Take 1 tablet by mouth daily.        . Biotin 300 MCG TABS Take 600 mcg by mouth every other day.        .  Calcium-Magnesium-Zinc 500-250-12.5 MG TABS Take 1 tablet by mouth every other day.        . Chromium Picolinate 200 MCG TABS Take 1 tablet by mouth every other day.        . clopidogrel (PLAVIX) 75 MG tablet Take 1 tablet (75 mg total) by mouth daily.  90 tablet  1  . Coenzyme Q10 (COQ10) 200 MG CAPS Take 1 capsule by mouth daily.        Marland Kitchen CRANBERRY JUICE EXTRACT PO Take 500 mg by mouth daily.        . Flaxseed, Linseed, (FLAX SEED OIL) 1000 MG CAPS Take 1,200 mg by mouth daily.        . Garlic Oil 027 MG TABS Take 1 tablet by mouth daily.        Nyoka Cowden Tea, Camillia sinensis, 315 MG CAPS Take 1 capsule by mouth daily.        . metoprolol tartrate (LOPRESSOR) 25 MG tablet TAKE 1/2 TABLET BY MOUTH 2 TIMES DAILY  90 tablet  1  . nitroGLYCERIN (NITROSTAT) 0.4 MG SL tablet Place 1 tablet (0.4 mg total) under the tongue every 5 (five) minutes as needed.  25 tablet  6  . Omega-3 Fatty Acids (FISH OIL) 1200 MG CAPS Take 1 capsule by mouth daily.        . Rhodiola 300 MG CAPS Take 1 capsule by mouth daily.         No current facility-administered medications for this visit.    BP 126/66  Pulse 72  Ht 5\' 1"  (1.549 m)  Wt 169 lb (76.658 kg)  BMI 31.95 kg/m2 General: NAD Neck: No JVD, no thyromegaly or thyroid nodule.  Lungs: Clear to auscultation bilaterally with normal respiratory effort. CV: Nondisplaced PMI.  Heart regular S1/S2, no S3/S4, 1/6 SEM.  No peripheral edema.  No carotid bruit.  Normal pedal pulses.  Abdomen: Soft, nontender, no hepatosplenomegaly, no distention.  Skin: Intact without lesions or rashes.  Neurologic: Alert and oriented x 3.  Psych: Normal affect. Extremities: No clubbing or cyanosis.   Assessment/Plan: 1. CAD: Stable with no ischemic symptoms.  She had PCI in 1/09 in the setting of inferior MI.  She had residual untreated 50-70% mLAD stenosis.   - Continue statin and metoprolol. - She has been on Plavix long-term with no evidence for GI bleeding.  She is not  taking aspirin at all now.  I will check a P2Y12 level to make sure that Plavix is properly inhibiting platelets. 2. Hyperlipidemia: Goal LDL < 70.  I suggested that she stop atorvastatin and change to Crestor 40.  She wants to try to exercise, diet and lose weight.  Therefore, I will continue the current regimen and will get repeat lipids in 10/15.  If LDL is still significantly above 70, she will need to change to Crestor.  3. Aortic insufficiency: Mild to moderate on 3/14 echo.  Repeat echo in 3/16 to make sure AI remains stable.    [name] 01/20/2014

## 2014-03-23 ENCOUNTER — Other Ambulatory Visit: Payer: BC Managed Care – PPO

## 2014-03-24 ENCOUNTER — Ambulatory Visit: Payer: BC Managed Care – PPO | Admitting: Pharmacist

## 2014-05-27 ENCOUNTER — Other Ambulatory Visit: Payer: Self-pay | Admitting: Obstetrics and Gynecology

## 2014-06-01 ENCOUNTER — Telehealth: Payer: Self-pay | Admitting: Cardiology

## 2014-06-01 ENCOUNTER — Other Ambulatory Visit (INDEPENDENT_AMBULATORY_CARE_PROVIDER_SITE_OTHER): Payer: BC Managed Care – PPO | Admitting: *Deleted

## 2014-06-01 DIAGNOSIS — Z79899 Other long term (current) drug therapy: Secondary | ICD-10-CM

## 2014-06-01 DIAGNOSIS — E785 Hyperlipidemia, unspecified: Secondary | ICD-10-CM

## 2014-06-01 LAB — LIPID PANEL
Cholesterol: 172 mg/dL (ref 0–200)
HDL: 46.8 mg/dL
LDL Cholesterol: 90 mg/dL (ref 0–99)
NonHDL: 125.2
Total CHOL/HDL Ratio: 4
Triglycerides: 178 mg/dL — ABNORMAL HIGH (ref 0.0–149.0)
VLDL: 35.6 mg/dL (ref 0.0–40.0)

## 2014-06-01 LAB — COMPREHENSIVE METABOLIC PANEL WITH GFR
ALT: 20 U/L (ref 0–35)
AST: 20 U/L (ref 0–37)
Albumin: 3.8 g/dL (ref 3.5–5.2)
Alkaline Phosphatase: 89 U/L (ref 39–117)
BUN: 17 mg/dL (ref 6–23)
CO2: 27 meq/L (ref 19–32)
Calcium: 8.7 mg/dL (ref 8.4–10.5)
Chloride: 105 meq/L (ref 96–112)
Creatinine, Ser: 0.7 mg/dL (ref 0.4–1.2)
GFR: 86.93 mL/min
Glucose, Bld: 105 mg/dL — ABNORMAL HIGH (ref 70–99)
Potassium: 3.4 meq/L — ABNORMAL LOW (ref 3.5–5.1)
Sodium: 138 meq/L (ref 135–145)
Total Bilirubin: 0.6 mg/dL (ref 0.2–1.2)
Total Protein: 6.6 g/dL (ref 6.0–8.3)

## 2014-06-01 NOTE — Telephone Encounter (Signed)
Left message to call back  

## 2014-06-01 NOTE — Telephone Encounter (Signed)
F/U   Pt returning call about lab results. May be reached at [phone]

## 2014-06-02 NOTE — Telephone Encounter (Signed)
lmtcb

## 2014-06-04 NOTE — Telephone Encounter (Signed)
NA

## 2014-06-05 ENCOUNTER — Ambulatory Visit (INDEPENDENT_AMBULATORY_CARE_PROVIDER_SITE_OTHER): Payer: BC Managed Care – PPO | Admitting: Pharmacist

## 2014-06-05 DIAGNOSIS — E785 Hyperlipidemia, unspecified: Secondary | ICD-10-CM

## 2014-06-05 NOTE — Patient Instructions (Signed)
Continue Lipitor 80mg  daily.  Recheck your labs again in 6 months

## 2014-06-05 NOTE — Telephone Encounter (Signed)
Lipid clinic today

## 2014-06-08 ENCOUNTER — Telehealth: Payer: Self-pay | Admitting: Cardiology

## 2014-06-08 NOTE — Telephone Encounter (Signed)
New message     Office calling need cardiac clearance for upcoming procedure DNC on  12/30.

## 2014-06-08 NOTE — Telephone Encounter (Signed)
Home [facility] calling to get clearance for D& C scheduled for 06/17/14.  Will forward to Dr Aundra Dubin for review.

## 2014-06-09 NOTE — Telephone Encounter (Signed)
New Message   [name] with [facility] called reports the Pt is concerned that she is taking plavix .Emily [name] And she wants to know if her medication will need to be changed prior to the surgery. Please call the office back to discuss.   If so please send a statement through fax  Fax # [phone]

## 2014-06-09 NOTE — Assessment & Plan Note (Signed)
Pt's LDL improved since June but still above goal of 70mg /dL.  She is on max dose of Lipitor and not interested in changing therapy at this time.  She wants to continue to try to improve with lifestyle changes.  Will not make changes at this visit and plan to recheck labs in 6 months.

## 2014-06-09 NOTE — Telephone Encounter (Signed)
LM for Emily [name] to let Dr Aundra Dubin know how long the surgeon wants pt to hold Plavix prior to surgery. Once I have this information I can forward to Dr Aundra Dubin for review.

## 2014-06-09 NOTE — Progress Notes (Signed)
Emily [name] is a 63 yo F with history of CAD s/p stent placement to third obtuse marginal, here for follow up of her hyperlipidemia. Pt just got through with another semester of school and she states it was less stress than the Spring semester.  Unfortunately she has had some health concerns.  She had a labial biopsy that was normal but they did find a cyst on her cervix so she is undergoing surgery on 12/30 to have this removed.   Despite these issues, her LDL is slightly better at 90. This is still higher than her goal of 70.  She is very resistant to changing or adding prescription medication, and is very concerned about side effects coming from prescription meds.  I discussed changing her to Crestor 40 mg, which was Dr.McLean's recommendation, however she declines making this change at this time.  We also discussed Zetia addition but she is not interested in that at this time either.    Diet: She states her diet has not changed much, and has been trying to eat healthier. She uses her fit bit and phone app to count calories/track sleep (<1200 kcal/day).  She usually only eats 2 meals per day but does stay below 1200 kcal/day most days.   Exercise: Patient has not done anything for exercise in over a year now.  She is getting in about 5000 steps per day on her fit bit.  She states she has motivation to get in shape with her daughter getting married in May.   Labs:   05/2014: TC 172, LDL 90, TG 178, HDL 47, LFTs normal (Lipitor 80mg  qd and fish oil)  11/2013:  TC 188, LDL 106, TG 170, HDL 48, LFTs normal (Lipitor 80 mg qd, fish oil)  Current Outpatient Prescriptions  Medication Sig Dispense Refill  . Acetylcarnitine HCl (ACETYL L-CARNITINE) 250 MG CAPS Take by mouth. 400 mg every other day     . albuterol (PROVENTIL HFA;VENTOLIN HFA) 108 (90 BASE) MCG/ACT inhaler Inhale 2 puffs into the lungs every 6 (six) hours as needed for wheezing or shortness of breath. 1 Inhaler 0  . ALPRAZolam (XANAX) 0.25  MG tablet take 1 tablet by mouth at bedtime if needed 30 tablet 0  . atorvastatin (LIPITOR) 80 MG tablet Take 1 tablet (80 mg total) by mouth daily. 90 tablet 1  . b complex vitamins tablet Take 1 tablet by mouth daily.      . Biotin 300 MCG TABS Take 600 mcg by mouth every other day.      . Calcium-Magnesium-Zinc 500-250-12.5 MG TABS Take 1 tablet by mouth every other day.      . Chromium Picolinate 200 MCG TABS Take 1 tablet by mouth every other day.      . clopidogrel (PLAVIX) 75 MG tablet Take 1 tablet (75 mg total) by mouth daily. 90 tablet 1  . Coenzyme Q10 (COQ10) 200 MG CAPS Take 1 capsule by mouth daily.      Marland Kitchen CRANBERRY JUICE EXTRACT PO Take 500 mg by mouth daily.      . Flaxseed, Linseed, (FLAX SEED OIL) 1000 MG CAPS Take 1,200 mg by mouth daily.      . Garlic Oil 222 MG TABS Take 1 tablet by mouth daily.      Nyoka Cowden Tea, Camillia sinensis, 315 MG CAPS Take 1 capsule by mouth daily.      . metoprolol tartrate (LOPRESSOR) 25 MG tablet TAKE 1/2 TABLET BY MOUTH 2 TIMES DAILY 90 tablet 1  .  nitroGLYCERIN (NITROSTAT) 0.4 MG SL tablet Place 1 tablet (0.4 mg total) under the tongue every 5 (five) minutes as needed. 25 tablet 6  . Omega-3 Fatty Acids (FISH OIL) 1200 MG CAPS Take 1 capsule by mouth daily.      . Rhodiola 300 MG CAPS Take 1 capsule by mouth daily.       No current facility-administered medications for this visit.

## 2014-06-09 NOTE — Telephone Encounter (Signed)
OK to hold Plavix x 5 days prior to surgery.

## 2014-06-10 ENCOUNTER — Emergency Department (HOSPITAL_COMMUNITY): Payer: BC Managed Care – PPO

## 2014-06-10 ENCOUNTER — Emergency Department (HOSPITAL_COMMUNITY)
Admission: EM | Admit: 2014-06-10 | Discharge: 2014-06-10 | Disposition: A | Discharge status: Home / Self Care | Payer: BC Managed Care – PPO | Attending: Emergency Medicine | Admitting: Emergency Medicine

## 2014-06-10 ENCOUNTER — Encounter: Payer: Self-pay | Admitting: *Deleted

## 2014-06-10 ENCOUNTER — Encounter (HOSPITAL_COMMUNITY): Payer: Self-pay | Admitting: Family Medicine

## 2014-06-10 DIAGNOSIS — M546 Pain in thoracic spine: Secondary | ICD-10-CM | POA: Diagnosis not present

## 2014-06-10 DIAGNOSIS — F419 Anxiety disorder, unspecified: Secondary | ICD-10-CM | POA: Insufficient documentation

## 2014-06-10 DIAGNOSIS — E785 Hyperlipidemia, unspecified: Secondary | ICD-10-CM | POA: Diagnosis not present

## 2014-06-10 DIAGNOSIS — Z79899 Other long term (current) drug therapy: Secondary | ICD-10-CM | POA: Insufficient documentation

## 2014-06-10 DIAGNOSIS — Z8669 Personal history of other diseases of the nervous system and sense organs: Secondary | ICD-10-CM | POA: Insufficient documentation

## 2014-06-10 DIAGNOSIS — Z87891 Personal history of nicotine dependence: Secondary | ICD-10-CM | POA: Diagnosis not present

## 2014-06-10 DIAGNOSIS — Z86010 Personal history of colonic polyps: Secondary | ICD-10-CM | POA: Diagnosis not present

## 2014-06-10 DIAGNOSIS — M549 Dorsalgia, unspecified: Secondary | ICD-10-CM

## 2014-06-10 DIAGNOSIS — Z880 Allergy status to penicillin: Secondary | ICD-10-CM | POA: Diagnosis not present

## 2014-06-10 DIAGNOSIS — I10 Essential (primary) hypertension: Secondary | ICD-10-CM | POA: Diagnosis not present

## 2014-06-10 DIAGNOSIS — M25519 Pain in unspecified shoulder: Secondary | ICD-10-CM | POA: Diagnosis present

## 2014-06-10 DIAGNOSIS — I2510 Atherosclerotic heart disease of native coronary artery without angina pectoris: Secondary | ICD-10-CM | POA: Insufficient documentation

## 2014-06-10 DIAGNOSIS — F329 Major depressive disorder, single episode, unspecified: Secondary | ICD-10-CM | POA: Diagnosis not present

## 2014-06-10 DIAGNOSIS — R079 Chest pain, unspecified: Secondary | ICD-10-CM

## 2014-06-10 LAB — BASIC METABOLIC PANEL
Anion gap: 5 (ref 5–15)
BUN: 16 mg/dL (ref 6–23)
CALCIUM: 9.1 mg/dL (ref 8.4–10.5)
CO2: 28 mmol/L (ref 19–32)
Chloride: 105 mEq/L (ref 96–112)
Creatinine, Ser: 0.7 mg/dL (ref 0.50–1.10)
GFR calc Af Amer: >90 mL/min (ref >90)
GLUCOSE: 112 mg/dL — AB (ref 70–99)
Potassium: 3.6 mmol/L (ref 3.5–5.1)
Sodium: 138 mmol/L (ref 135–145)

## 2014-06-10 LAB — CBC
HCT: 43.2 % (ref 36.0–46.0)
HEMOGLOBIN: 13.9 g/dL (ref 12.0–15.0)
MCH: 28.6 pg (ref 26.0–34.0)
MCHC: 32.2 g/dL (ref 30.0–36.0)
MCV: 88.9 fL (ref 78.0–100.0)
Platelets: 260 10*3/uL (ref 150–400)
RBC: 4.86 MIL/uL (ref 3.87–5.11)
RDW: 13.6 % (ref 11.5–15.5)
WBC: 7.1 10*3/uL (ref 4.0–10.5)

## 2014-06-10 LAB — I-STAT TROPONIN, ED
TROPONIN I, POC: 0.01 ng/mL (ref 0.00–0.08)
Troponin i, poc: 0 ng/mL (ref 0.00–0.08)

## 2014-06-10 NOTE — ED Notes (Signed)
Pt from home with reports of chest palpitations that began at night, pt denies any pain at this time. Pt denies any n/v/d or sob with chest pain. NAD, axox 4

## 2014-06-10 NOTE — Discharge Instructions (Signed)
Please follow up with your heart doctor or your primary care provider for further evaluation of your right upper back pain.  Return if you develop chest pain, shortness of breath or if you have any other concerns.

## 2014-06-10 NOTE — Telephone Encounter (Signed)
Note faxed to [facility]. Confirmed fax- [phone] with the office prior to faxing.

## 2014-06-10 NOTE — ED Notes (Signed)
Pt complaining of episode of chest tightness, upper back pain and left shoulder pain. sts hx of MI with stent. sts last night she had a palpitations. sts she was also hypertensive. sts 196/111.

## 2014-06-10 NOTE — ED Provider Notes (Signed)
EKG Interpretation  Date/Time:  Wednesday June 10 2014 16:44:06 EST Ventricular Rate:  67 PR Interval:  161 QRS Duration: 85 QT Interval:  400 QTC Calculation: 422 R Axis:   63 Text Interpretation:  Sinus rhythm Low voltage, precordial leads No significant change since last tracing Confirmed by Christy Gentles  MD, Spencer ([id number]) on 06/10/2014 5:06:23 PM          Sharyon Cable, MD 06/10/14 [phone]

## 2014-06-10 NOTE — ED Provider Notes (Signed)
CSN: [id number]     Arrival date & time 06/10/14  1234 History   First MD Initiated Contact with Patient 06/10/14 1328     Chief Complaint  Patient presents with  . Chest Pain     (Consider location/radiation/quality/duration/timing/severity/associated sxs/prior Treatment) HPI  63 year old female with history of CAD, prior MI with stent currently on Plavix, anxiety, hypertension, hyperlipidemia who presents complaint of R upper back pain.  Patient had PCI in 2009 in the setting of inferior MI. She had residual untreated 50-70% mid LAD stenosis. Patient also had a D&C procedure scheduled for 06/17/2014 by [facility]. She was sitting on the computer earlier today when she noticed an achy sensation to her right shoulder blade that lasted for approximately 20-30 minutes. Nothing seems to make his symptoms better or worse. The pain is discomforting but has since resolved. Her pain started approximately 3 hours ago. There was no associated lightheadedness, dizziness, nausea, chest pain, shortness of breath, productive cough, abdominal pain, numbness or weakness. She denies any recent strength activities, she denies any injury. She check her blood pressure and it was elevated despite taking her blood pressure earlier in the day. Patient states she has history of MI in the past and did remember having back pain along with a constellation of symptoms. She would like to be evaluated "just in case" she is currently symptom free. She also mentioned having noticed some heart palpitation at nighttime for the past several nights lasting for 20-30 minutes. No associated lightheadedness or dizziness at that time and no associated chest pain.        Past Medical History  Diagnosis Date  . Chronic insomnia   . Perirectal abscess   . Colon polyp   . Anxiety   . Depression   . Hypertension   . CAD (coronary artery disease)   . Heart attack 1-09  . Hyperlipemia    Past Surgical History   Procedure Laterality Date  . Cesarean section    . Ptca with stent to third obtuse marginal     Family History  Problem Relation Age of Onset  . Skin cancer Mother   . Allergy (severe) Father    History  Substance Use Topics  . Smoking status: Former Smoker -- 2.00 packs/day for 20 years    Types: Cigarettes    Quit date: 06/19/2006  . Smokeless tobacco: Never Used  . Alcohol Use: No   OB History    No data available     Review of Systems  All other systems reviewed and are negative.     Allergies  Chlorpromazine hcl; Codeine; Hydrocodone; Hydrocodone-acetaminophen; Penicillins; Prochlorperazine edisylate; Promethazine hcl; and Sulfonamide derivatives  Home Medications   Prior to Admission medications   Medication Sig Start Date End Date Taking? Authorizing Provider  Acetylcarnitine HCl (ACETYL L-CARNITINE) 250 MG CAPS Take by mouth. 400 mg every other day     Historical Provider, MD  albuterol (PROVENTIL HFA;VENTOLIN HFA) 108 (90 BASE) MCG/ACT inhaler Inhale 2 puffs into the lungs every 6 (six) hours as needed for wheezing or shortness of breath. 07/07/13   Brunetta Jeans, PA-C  ALPRAZolam Duanne Moron) 0.25 MG tablet take 1 tablet by mouth at bedtime if needed 01/19/14   Larey Dresser, MD  atorvastatin (LIPITOR) 80 MG tablet Take 1 tablet (80 mg total) by mouth daily. 01/19/14   Larey Dresser, MD  b complex vitamins tablet Take 1 tablet by mouth daily.      Historical Provider, MD  Biotin 300 MCG TABS Take 600 mcg by mouth every other day.      Historical Provider, MD  Calcium-Magnesium-Zinc 500-250-12.5 MG TABS Take 1 tablet by mouth every other day.      Historical Provider, MD  Chromium Picolinate 200 MCG TABS Take 1 tablet by mouth every other day.      Historical Provider, MD  clopidogrel (PLAVIX) 75 MG tablet Take 1 tablet (75 mg total) by mouth daily. 01/19/14   Larey Dresser, MD  Coenzyme Q10 (COQ10) 200 MG CAPS Take 1 capsule by mouth daily.      Historical  Provider, MD  CRANBERRY JUICE EXTRACT PO Take 500 mg by mouth daily.      Historical Provider, MD  Flaxseed, Linseed, (FLAX SEED OIL) 1000 MG CAPS Take 1,200 mg by mouth daily.      Historical Provider, MD  Garlic Oil 546 MG TABS Take 1 tablet by mouth daily.      Historical Provider, MD  Nyoka Cowden Tea, Camillia sinensis, 315 MG CAPS Take 1 capsule by mouth daily.      Historical Provider, MD  metoprolol tartrate (LOPRESSOR) 25 MG tablet TAKE 1/2 TABLET BY MOUTH 2 TIMES DAILY 01/19/14   Larey Dresser, MD  nitroGLYCERIN (NITROSTAT) 0.4 MG SL tablet Place 1 tablet (0.4 mg total) under the tongue every 5 (five) minutes as needed. 01/19/14   Larey Dresser, MD  Omega-3 Fatty Acids (FISH OIL) 1200 MG CAPS Take 1 capsule by mouth daily.      Historical Provider, MD  Rhodiola 300 MG CAPS Take 1 capsule by mouth daily.      Historical Provider, MD   BP 154/73 mmHg  Pulse 84  Temp(Src) 97.9 F (36.6 C) (Oral)  Resp 18  Ht 5\' 1"  (1.549 m)  Wt 163 lb (73.936 kg)  BMI 30.81 kg/m2  SpO2 97% Physical Exam  Constitutional: She is oriented to person, place, and time. She appears well-developed and well-nourished. No distress.  HENT:  Head: Atraumatic.  Eyes: Conjunctivae are normal.  Neck: Normal range of motion. Neck supple. No JVD present.  Cardiovascular: Normal rate, regular rhythm and intact distal pulses.  Exam reveals no gallop and no friction rub.   No murmur heard. Pulmonary/Chest: Effort normal and breath sounds normal. She has no wheezes. She has no rales. She exhibits no tenderness.  Abdominal: Soft. There is no tenderness.  Musculoskeletal: Normal range of motion. She exhibits no edema.  Neurological: She is alert and oriented to person, place, and time.  Skin: No rash noted.  Psychiatric: She has a normal mood and affect.  Nursing note and vitals reviewed.   ED Course  Procedures (including critical care time)  1:51 PM Patient complaining of right upper back pain below right shoulder  blade lasting transiently for approximately 20-30 minutes and since has resolved. She has no reproducible pain on examination. She does have history of prior MI with cardiac stenting currently on Plavix. She denies having any chest pain at all. No shortness of breath, productive cough, or symptoms to suggest PE. Her complaint is atypical for ACS. Workup initiated.  2:57 PM EKG without acute ischemic changes, normal troponin, labs are unremarkable, normal electrolyte panel, chest x-ray without any significant cardiopulmonary pathology.  4:09 PM Pt resting comfortably.  No active pain.  I will obtain a delta troponin, if negative and pt is sxs free then she can follow up closely with her cardiologist for PCP for further care.  Care discussed with  oncoming provider who will d/c pending repeat troponin value.  Care discussed with Dr. Stark Jock.    Labs Review Labs Reviewed  BASIC METABOLIC PANEL - Abnormal; Notable for the following:    Glucose, Bld 112 (*)    All other components within normal limits  CBC  I-STAT TROPOININ, ED  Randolm Idol, ED    Imaging Review Dg Chest 2 View  06/10/2014   CLINICAL DATA:  Increased back pain, chest pain  EXAM: CHEST  2 VIEW  COMPARISON:  08/08/2009  FINDINGS: There is no focal parenchymal opacity, pleural effusion, or pneumothorax. The heart size is top normal.  The osseous structures are unremarkable.  IMPRESSION: No active cardiopulmonary disease.   Electronically Signed   By: Kathreen Devoid   On: 06/10/2014 14:56     EKG Interpretation None      Date: 06/10/2014  Rate: 101  Rhythm: sinus tachycardia  QRS Axis: normal  Intervals: normal  ST/T Wave abnormalities: normal  Conduction Disutrbances:none  Narrative Interpretation:   Old EKG Reviewed: unchanged    MDM   Final diagnoses:  Upper back pain on right side    BP 121/79 mmHg  Pulse 64  Temp(Src) 97.9 F (36.6 C) (Oral)  Resp 13  Ht 5\' 1"  (1.549 m)  Wt 163 lb (73.936 kg)  BMI 30.81  kg/m2  SpO2 97%  I have reviewed nursing notes and vital signs. I personally reviewed the imaging tests through PACS system  I reviewed available ER/hospitalization records thought the EMR     Domenic Moras, PA-C 06/10/14 Miranda, MD 06/10/14 [phone]

## 2014-06-11 NOTE — ED Provider Notes (Signed)
  Physical Exam  BP 133/76 mmHg  Pulse 74  Temp(Src) 97.9 F (36.6 C) (Oral)  Resp 16  Ht 5\' 1"  (1.549 m)  Wt 163 lb (73.936 kg)  BMI 30.81 kg/m2  SpO2 96%  Physical Exam  ED Course  Procedures  MDM Received care of patient at 4 PM from [facility].  Please see his note for prior care.  Briefly this is a 63 year old female with a history of CAD, hypertension, hyperlipidemia who presented with right upper back pain.  Patient does not have risk factors, signs, or symptoms to suggest pulmonary embolus. She denies any chest pain or shortness of breath and this presentation is not similar to the her prior MI.  Her initial EKG was without ischemic changes, and Dr. troponins are also negative. Chest x-ray shows no cardiopulmonary pathology.  Given history, EKG, chest x-ray, lab work have low suspicion for PE, aortic dissection, pericarditis, ACS.  Of note, patient has received is being treated for urinary tract infection by her PCP- given location of the back pain (upper not flank), no leukocytosis, no fever, no nausea vomiting do not feel this is consistent with pyelonephritis.  Discussed all results with pt.  Patient discharged in stable condition with understanding of reasons to return and recommended close PCP follow up.       Alvino Chapel, MD 06/11/14 5830  Sharyon Cable, MD 06/11/14 [phone]

## 2014-06-15 ENCOUNTER — Telehealth: Payer: Self-pay | Admitting: Physician Assistant

## 2014-06-15 ENCOUNTER — Telehealth: Payer: Self-pay | Admitting: Cardiology

## 2014-06-15 NOTE — Telephone Encounter (Signed)
Patient reports that she was in the ED on Christmas Eve. Had posterior rt shoulder pain, and a BP of 196/117. History if previous MI a few years ago, so she didn't want to take a chance. All tests turned out to be within normal limits. She questioned her BP reading. Someone in ED commented that they thought Cipro could cause BP elevation in some people. Patient was on this for a UTI. Finished now. Advised her to consult with her PCP about the shoulder pain, and elevated BP. She is having OP Gyn surgery on Wednesday and is stopping her Plavix till after procedure.

## 2014-06-15 NOTE — Telephone Encounter (Signed)
Error/gd °

## 2014-06-15 NOTE — Telephone Encounter (Signed)
New problem   Pt calling  Pt was told by ER to call and let office know she was seen in ER for possible heart attack. Pt stated all test was normal and negative. Pt want to discuss her condition with nurse.

## 2014-06-17 ENCOUNTER — Other Ambulatory Visit: Payer: Self-pay | Admitting: Obstetrics and Gynecology

## 2014-07-02 ENCOUNTER — Telehealth: Payer: Self-pay | Admitting: Internal Medicine

## 2014-07-02 ENCOUNTER — Telehealth: Payer: Self-pay | Admitting: Cardiology

## 2014-07-02 NOTE — Telephone Encounter (Signed)
Caller name: Pammie, Chirino Relationship to patient: self  Can be reached: [phone]   Reason for call: Pt was extremely adamant about leaving a message for [place] and did not want to elaborate what the message consisted of. Pt stated her GYN advised her to contact her PCP. Pt states Einar Pheasant is her PCP. Please advise (advised pt Einar Pheasant is off and will follow up on the next business day)

## 2014-07-02 NOTE — Telephone Encounter (Signed)
Fever and elevated platelet count likely indication infection.  Continue her Plavix, followup with PCP/ob.

## 2014-07-02 NOTE — Telephone Encounter (Signed)
Pt did hold Plavix for 5 days prior to surgery 06/07/14.  Pt has also called her PCP today for further recommendations.  Pt aware I will forward to Dr Aundra Dubin for review.

## 2014-07-02 NOTE — Telephone Encounter (Signed)
New problem   Pt had sx on 12/30 w/Dr Rivard / OBGYN and now has a elevated platelet count. Pt stated her doctor told her to advise her cardiologist concerning this matter. Please advise pt.

## 2014-07-02 NOTE — Telephone Encounter (Signed)
Pt states she had hysteroscopy and endometrial biopsy 06/17/14. Pt reports since that time she has consistently had a low grade fever -this highest she recorded was 100.7. Pt states she has discussed this with her GYN, she had urinalysis and CBC 06/30/14 at her GYN that she reports was all normal --including WBC- except a platelet count of 404. Pt saw her GYN today and was advised to call her PCP and cardiologist to follow up on low grade fever and platelet count. Pt denies any other symptoms except just not feeling well.

## 2014-07-03 NOTE — Telephone Encounter (Signed)
FYI

## 2014-07-03 NOTE — Telephone Encounter (Signed)
Lab results received via fax from [facility]. Forwarded to [place]. JG//CMA

## 2014-07-03 NOTE — Telephone Encounter (Signed)
LMTCB

## 2014-07-03 NOTE — Telephone Encounter (Signed)
LMOM with contact name and number for return call RE: address concerns and further provider instructions; will attempt again after clinic finished today/SLS

## 2014-07-03 NOTE — Telephone Encounter (Signed)
Please call patient to assess her concerns.  Her recent note from GYN was reviewed.  I see where she had a recent procedure performed.  She must have been complaining about a fever.  No fever at time of that exam.  CBC looks good.  Is she having any residual issues?

## 2014-07-03 NOTE — Telephone Encounter (Signed)
Pt.notified

## 2014-07-07 NOTE — Telephone Encounter (Signed)
Patient scheduled for appt to discuss daily evening low-grade fever with unknown cause [no other symptoms] Fri, 01.22.15 at 1:15p/SLS

## 2014-07-10 ENCOUNTER — Ambulatory Visit: Payer: Self-pay | Admitting: Physician Assistant

## 2014-07-14 ENCOUNTER — Telehealth: Payer: Self-pay | Admitting: Cardiology

## 2014-07-14 NOTE — Telephone Encounter (Signed)
Rout message to Webb Silversmith, Dr Claris Gladden nurse.

## 2014-07-14 NOTE — Telephone Encounter (Signed)
New message    Patient is aware that Webb Silversmith is off today. Would like for her to call when she return to work.

## 2014-07-15 ENCOUNTER — Telehealth: Payer: Self-pay | Admitting: Physician Assistant

## 2014-07-15 ENCOUNTER — Other Ambulatory Visit: Payer: Self-pay | Admitting: Cardiology

## 2014-07-15 NOTE — Telephone Encounter (Signed)
Advised patient

## 2014-07-15 NOTE — Telephone Encounter (Signed)
I do not know what all will be needed.  She and I need to actually have a discussion concerning her visits and review the notes and lab results already obtained by her Gynecologist.  Otherwise she may have to be stuck twice.

## 2014-07-15 NOTE — Telephone Encounter (Signed)
Caller name:Demos, Nastasha Relation to pt: self Call back number:[phone]   Reason for call:  Pt has a follow up appointment Friday and would like to know if she can have lab work done before. Pt states leave detail message on voicemail when be in class at 12:30pm. Pt states she would like prior lab work so it can be discussed at Friday appointment. Please advise

## 2014-07-15 NOTE — Telephone Encounter (Signed)
Called patient who insist on having labs prior.  Would you like to order anything before the visit? Advised patient the PCP out of office.

## 2014-07-15 NOTE — Telephone Encounter (Signed)
I spoke with patient, she was not calling for herself but about her father.

## 2014-07-17 ENCOUNTER — Encounter: Payer: Self-pay | Admitting: Physician Assistant

## 2014-07-17 ENCOUNTER — Ambulatory Visit (INDEPENDENT_AMBULATORY_CARE_PROVIDER_SITE_OTHER): Payer: Self-pay | Admitting: Physician Assistant

## 2014-07-17 VITALS — BP 129/72 | HR 61 | Temp 98.4°F | Resp 16 | Ht 61.0 in | Wt 157.4 lb

## 2014-07-17 DIAGNOSIS — F411 Generalized anxiety disorder: Secondary | ICD-10-CM

## 2014-07-17 DIAGNOSIS — G4700 Insomnia, unspecified: Secondary | ICD-10-CM

## 2014-07-17 DIAGNOSIS — R509 Fever, unspecified: Secondary | ICD-10-CM

## 2014-07-17 LAB — URINALYSIS, ROUTINE W REFLEX MICROSCOPIC
Bilirubin Urine: NEGATIVE
Hgb urine dipstick: NEGATIVE
Ketones, ur: NEGATIVE
Leukocytes, UA: NEGATIVE
NITRITE: NEGATIVE
Specific Gravity, Urine: >=1.03 — AB (ref 1.000–1.030)
Total Protein, Urine: NEGATIVE
URINE GLUCOSE: NEGATIVE
UROBILINOGEN UA: 0.2 (ref 0.0–1.0)
pH: 5.5 (ref 5.0–8.0)

## 2014-07-17 LAB — COMPREHENSIVE METABOLIC PANEL
ALT: 16 U/L (ref 0–35)
AST: 16 U/L (ref 0–37)
Albumin: 4.3 g/dL (ref 3.5–5.2)
Alkaline Phosphatase: 116 U/L (ref 39–117)
BILIRUBIN TOTAL: 0.6 mg/dL (ref 0.2–1.2)
BUN: 16 mg/dL (ref 6–23)
CALCIUM: 9.9 mg/dL (ref 8.4–10.5)
CHLORIDE: 102 meq/L (ref 96–112)
CO2: 30 mEq/L (ref 19–32)
Creatinine, Ser: 0.59 mg/dL (ref 0.40–1.20)
GFR: 109.34 mL/min (ref >60.00)
GLUCOSE: 92 mg/dL (ref 70–99)
Potassium: 4.1 mEq/L (ref 3.5–5.1)
Sodium: 140 mEq/L (ref 135–145)
TOTAL PROTEIN: 7.6 g/dL (ref 6.0–8.3)

## 2014-07-17 LAB — CBC WITH DIFFERENTIAL/PLATELET
BASOS ABS: 0 10*3/uL (ref 0.0–0.1)
BASOS PCT: 0.3 % (ref 0.0–3.0)
EOS ABS: 0.2 10*3/uL (ref 0.0–0.7)
Eosinophils Relative: 2.4 % (ref 0.0–5.0)
HCT: 39.5 % (ref 36.0–46.0)
Hemoglobin: 13.2 g/dL (ref 12.0–15.0)
LYMPHS ABS: 1.6 10*3/uL (ref 0.7–4.0)
LYMPHS PCT: 20.1 % (ref 12.0–46.0)
MCHC: 33.6 g/dL (ref 30.0–36.0)
MCV: 85.5 fl (ref 78.0–100.0)
MONO ABS: 0.6 10*3/uL (ref 0.1–1.0)
Monocytes Relative: 7.6 % (ref 3.0–12.0)
Neutro Abs: 5.7 10*3/uL (ref 1.4–7.7)
Neutrophils Relative %: 69.6 % (ref 43.0–77.0)
PLATELETS: 428 10*3/uL — AB (ref 150.0–400.0)
RBC: 4.61 Mil/uL (ref 3.87–5.11)
RDW: 14.1 % (ref 11.5–15.5)
WBC: 8.2 10*3/uL (ref 4.0–10.5)

## 2014-07-17 LAB — HIGH SENSITIVITY CRP: CRP, High Sensitivity: 76.8 mg/L — ABNORMAL HIGH (ref 0.000–5.000)

## 2014-07-17 LAB — T4, FREE: Free T4: 0.95 ng/dL (ref 0.60–1.60)

## 2014-07-17 LAB — TSH: TSH: 1.6 u[IU]/mL (ref 0.35–4.50)

## 2014-07-17 MED ORDER — ALPRAZOLAM 0.25 MG PO TABS: ORAL_TABLET | ORAL | Status: DC

## 2014-07-17 NOTE — Patient Instructions (Addendum)
Please stop by the lab for blood work.  I will call you results.  Please stay well hydrated and try to relax -- your exam looks great and we will hopefully soon have an answer for this issue!

## 2014-07-17 NOTE — Progress Notes (Signed)
Pre visit review using our clinic review tool, if applicable. No additional management support is needed unless otherwise documented below in the visit note/SLS  

## 2014-07-19 LAB — CULTURE, URINE COMPREHENSIVE: Colony Count: 50000

## 2014-07-20 ENCOUNTER — Telehealth: Payer: Self-pay | Admitting: Physician Assistant

## 2014-07-20 DIAGNOSIS — R509 Fever, unspecified: Secondary | ICD-10-CM

## 2014-07-20 LAB — EPSTEIN-BARR VIRUS VCA ANTIBODY PANEL
EBV EA IgG: 8.8 U/mL (ref <9.0)
EBV NA IGG: 525 U/mL — AB (ref <18.0)
EBV VCA IGG: 555 U/mL — AB (ref <18.0)
EBV VCA IgM: <10 U/mL (ref <36.0)

## 2014-07-20 LAB — CMV IGM

## 2014-07-20 LAB — CYTOMEGALOVIRUS ANTIBODY, IGG

## 2014-07-20 NOTE — Telephone Encounter (Signed)
2nd attempt to call patient 4:43 on 07/20/14.  Still no answer. MyChart is not set up so cannot reach patient that way.  Patient with elevated hs-CRP (marker of infection or inflammation). Other labs good overall.  No active recurrence of Mono. Urine culture grew out lactobacillus which is normal flora and does not usually cause symptoms or require treatment. I would like to obtain more blood work to assess for autoimmune cause of symptoms and to look at her blood more closely under the microscope.  I would also like to get a chest x-ray. If anyone is able to reach the patient before I can, please relay this to her and let me know if she is willing to proceed.

## 2014-07-20 NOTE — Telephone Encounter (Signed)
Attempted to reach patient with results.  LMOM for callback.

## 2014-07-21 NOTE — Telephone Encounter (Signed)
Spoke with patient regarding results.  Still having low-grade fevers of 100.5-101 at night.  Still asymptomatic.  Will proceed with further lab workup and imaging.

## 2014-07-21 NOTE — Telephone Encounter (Signed)
3rd Attempt 8:46 AM on 07/21/14. LMOM for callback.

## 2014-07-24 ENCOUNTER — Ambulatory Visit (INDEPENDENT_AMBULATORY_CARE_PROVIDER_SITE_OTHER)
Admission: RE | Admit: 2014-07-24 | Discharge: 2014-07-24 | Disposition: A | Discharge status: Home / Self Care | Payer: Self-pay | Source: Ambulatory Visit | Attending: Physician Assistant | Admitting: Physician Assistant

## 2014-07-24 ENCOUNTER — Telehealth: Payer: Self-pay | Admitting: Physician Assistant

## 2014-07-24 ENCOUNTER — Other Ambulatory Visit (INDEPENDENT_AMBULATORY_CARE_PROVIDER_SITE_OTHER): Payer: Self-pay

## 2014-07-24 DIAGNOSIS — R509 Fever, unspecified: Secondary | ICD-10-CM

## 2014-07-24 LAB — CBC WITH DIFFERENTIAL/PLATELET
Basophils Absolute: 0.1 10*3/uL (ref 0.0–0.1)
Basophils Relative: 0.7 % (ref 0.0–3.0)
Eosinophils Absolute: 0.2 10*3/uL (ref 0.0–0.7)
Eosinophils Relative: 2.8 % (ref 0.0–5.0)
HCT: 38.8 % (ref 36.0–46.0)
HEMOGLOBIN: 13 g/dL (ref 12.0–15.0)
Lymphocytes Relative: 19 % (ref 12.0–46.0)
Lymphs Abs: 1.5 10*3/uL (ref 0.7–4.0)
MCHC: 33.4 g/dL (ref 30.0–36.0)
MCV: 85.3 fl (ref 78.0–100.0)
MONO ABS: 0.7 10*3/uL (ref 0.1–1.0)
MONOS PCT: 8.6 % (ref 3.0–12.0)
NEUTROS PCT: 68.9 % (ref 43.0–77.0)
Neutro Abs: 5.4 10*3/uL (ref 1.4–7.7)
Platelets: 431 10*3/uL — ABNORMAL HIGH (ref 150.0–400.0)
RBC: 4.55 Mil/uL (ref 3.87–5.11)
RDW: 14 % (ref 11.5–15.5)
WBC: 7.8 10*3/uL (ref 4.0–10.5)

## 2014-07-24 LAB — RHEUMATOID FACTOR: Rhuematoid fact SerPl-aCnc: <10 IU/mL (ref <=14)

## 2014-07-24 NOTE — Telephone Encounter (Signed)
Pt would like to have labs done at [facility], orders are in the system. Please advise if this is ok

## 2014-07-24 NOTE — Telephone Encounter (Signed)
If [facility] is willing to draw from order in system that is fine

## 2014-07-27 ENCOUNTER — Telehealth: Payer: Self-pay | Admitting: Physician Assistant

## 2014-07-27 DIAGNOSIS — R768 Other specified abnormal immunological findings in serum: Secondary | ICD-10-CM

## 2014-07-27 DIAGNOSIS — R509 Fever, unspecified: Secondary | ICD-10-CM

## 2014-07-27 DIAGNOSIS — R7982 Elevated C-reactive protein (CRP): Secondary | ICD-10-CM

## 2014-07-27 LAB — ANA: ANA: POSITIVE — AB

## 2014-07-27 LAB — ANTI-NUCLEAR AB-TITER (ANA TITER): ANA Titer 1: NEGATIVE

## 2014-07-27 NOTE — Telephone Encounter (Signed)
-----   Message from Rockwell Germany, [place] sent at 07/27/2014  4:54 PM EST -----   ----- Message -----    From: Brunetta Jeans, PA-C    Sent: 07/27/2014   3:52 PM      To: Rockwell Germany, CMA  Titer negative.  However giving positive ANA and symptoms, still want to set her up with Rheumatology.

## 2014-07-27 NOTE — Telephone Encounter (Signed)
Patient called back personally.  Discussed results with her and inconclusiveness of workup here.  She is willing to proceed with Rheumatology referral. Referral placed.

## 2014-07-27 NOTE — Telephone Encounter (Signed)
SEE Result Note.

## 2014-07-27 NOTE — Telephone Encounter (Signed)
Caller name: Jazlynne, Milliner Relation to pt: self  Call back number: [phone]   Reason for call:  Pt inquiring about lab results taken 07/24/14

## 2014-07-28 DIAGNOSIS — R509 Fever, unspecified: Secondary | ICD-10-CM | POA: Insufficient documentation

## 2014-07-28 NOTE — Progress Notes (Signed)
Patient presents to clinic today c/o unexplainable fever x 3-4 weeks.  Patient endorses evening temperatures ranging from 99.4 - 101.  This is occurring nightly and disappearing during the day. Patient denies URI symptoms, dysuria, urgency or frequency; denies abdominal pain, nausea or vomiting.  Endorses good bowel and urinary output.  Denies recent travel or sick contact.  Denies rash or skin lesion.  Denies history of unexplainable fevers.  Does endorse history of Mono several years prior. Recent labs obtained by her OB/GYN were unremarkable.  Past Medical History  Diagnosis Date  . Chronic insomnia   . Perirectal abscess   . Colon polyp   . Anxiety   . Depression   . Hypertension   . CAD (coronary artery disease)   . Heart attack 1-09  . Hyperlipemia     Current Outpatient Prescriptions on File Prior to Visit  Medication Sig Dispense Refill  . Acetylcarnitine HCl (ACETYL L-CARNITINE) 250 MG CAPS Take by mouth. 200 mg every other day    . albuterol (PROVENTIL HFA;VENTOLIN HFA) 108 (90 BASE) MCG/ACT inhaler Inhale 2 puffs into the lungs every 6 (six) hours as needed for wheezing or shortness of breath. 1 Inhaler 0  . atorvastatin (LIPITOR) 80 MG tablet Take 1 tablet (80 mg total) by mouth daily. 90 tablet 1  . b complex vitamins tablet Take 1 tablet by mouth daily.      . Biotin 300 MCG TABS Take 600 mcg by mouth every other day.      . Calcium-Magnesium-Zinc 500-250-12.5 MG TABS Take 1 tablet by mouth every other day.      . Chromium Picolinate 200 MCG TABS Take 1 tablet by mouth every other day.      . clopidogrel (PLAVIX) 75 MG tablet Take 1 tablet (75 mg total) by mouth daily. 90 tablet 1  . Coenzyme Q10 (COQ10) 200 MG CAPS Take 1 capsule by mouth daily.      Marland Kitchen CRANBERRY JUICE EXTRACT PO Take 500 mg by mouth daily.      . Flaxseed, Linseed, (FLAX SEED OIL) 1000 MG CAPS Take 1,200 mg by mouth daily.      . Garlic Oil 147 MG TABS Take 1 tablet by mouth daily.      Nyoka Cowden Tea,  Camillia sinensis, 315 MG CAPS Take 1 capsule by mouth daily.      . metoprolol tartrate (LOPRESSOR) 25 MG tablet take 1/2 tablet by mouth twice a day 30 tablet 0  . nitroGLYCERIN (NITROSTAT) 0.4 MG SL tablet Place 1 tablet (0.4 mg total) under the tongue every 5 (five) minutes as needed. 25 tablet 6  . Omega-3 Fatty Acids (FISH OIL) 1200 MG CAPS Take 1 capsule by mouth daily.      . Rhodiola 300 MG CAPS Take 1 capsule by mouth daily.       No current facility-administered medications on file prior to visit.    Allergies  Allergen Reactions  . Chlorpromazine Hcl   . Codeine     REACTION: causes vomiting  . Hydrocodone     REACTION: causes vomiing and nausea  . Hydrocodone-Acetaminophen   . Penicillins     REACTION: causes rash  . Prochlorperazine Edisylate   . Promethazine Hcl   . Sulfonamide Derivatives     Family History  Problem Relation Age of Onset  . Skin cancer Mother   . Allergy (severe) Father     History   Social History  . Marital Status: Married  Spouse Name: N/A    Number of Children: 2  . Years of Education: N/A   Occupational History  . housewife    Social History Main Topics  . Smoking status: Former Smoker -- 2.00 packs/day for 20 years    Types: Cigarettes    Quit date: 06/19/2006  . Smokeless tobacco: Never Used  . Alcohol Use: No  . Drug Use: No  . Sexual Activity: None   Other Topics Concern  . None   Social History Narrative   Resides in [place] with her husband.    Husband works in [place] and comes home two to three times a year   2 children who go to    Just got accepted to [employer]   Exercise- NO    Review of Systems - See HPI.  All other ROS are negative.  BP 129/72 mmHg  Pulse 61  Temp(Src) 98.4 F (36.9 C) (Oral)  Resp 16  Ht '5\' 1"'  (1.549 m)  Wt 157 lb 6 oz (71.385 kg)  BMI 29.75 kg/m2  SpO2 98%  Physical Exam  Constitutional: She is oriented to person, place, and time and well-developed, well-nourished, and in  no distress.  HENT:  Head: Normocephalic and atraumatic.  Right Ear: Tympanic membrane, external ear and ear canal normal.  Left Ear: Tympanic membrane, external ear and ear canal normal.  Nose: Nose normal. No mucosal edema.  Mouth/Throat: Uvula is midline, oropharynx is clear and moist and mucous membranes are normal. No oropharyngeal exudate or posterior oropharyngeal erythema.  Eyes: Conjunctivae are normal. Pupils are equal, round, and reactive to light.  Neck: Neck supple. No thyromegaly present.  Cardiovascular: Normal rate, regular rhythm, normal heart sounds and intact distal pulses.   Pulmonary/Chest: Effort normal and breath sounds normal. No respiratory distress. She has no wheezes. She has no rales.  Abdominal: Soft. Bowel sounds are normal. She exhibits no distension and no mass. There is no tenderness. There is no rebound and no guarding.  Lymphadenopathy:    She has no cervical adenopathy.  Neurological: She is alert and oriented to person, place, and time. No cranial nerve deficit.  Skin: Skin is warm and dry. No rash noted.  Psychiatric: Affect normal.  Vitals reviewed.   Recent Results (from the past 2160 hour(s))  Lipid Profile     Status: Abnormal   Collection Time: 06/01/14  9:59 AM  Result Value Ref Range   Cholesterol 172 0 - 200 mg/dL    Comment: ATP III Classification       Desirable:  < 200 mg/dL               Borderline High:  200 - 239 mg/dL          High:  > = 240 mg/dL   Triglycerides 178.0 (H) 0.0 - 149.0 mg/dL    Comment: Normal:  <150 mg/dLBorderline High:  150 - 199 mg/dL   HDL 46.80 >39.00 mg/dL   VLDL 35.6 0.0 - 40.0 mg/dL   LDL Cholesterol 90 0 - 99 mg/dL   Total CHOL/HDL Ratio 4     Comment:                Men          Women1/2 Average Risk     3.4          3.3Average Risk          5.0          4.42X Average Risk  9.6          7.13X Average Risk          15.0          11.0                       NonHDL 125.20     Comment: NOTE:  Non-HDL  goal should be 30 mg/dL higher than patient's LDL goal (i.e. LDL goal of < 70 mg/dL, would have non-HDL goal of < 100 mg/dL)  Comp Met (CMET)     Status: Abnormal   Collection Time: 06/01/14  9:59 AM  Result Value Ref Range   Sodium 138 135 - 145 mEq/L   Potassium 3.4 (L) 3.5 - 5.1 mEq/L   Chloride 105 96 - 112 mEq/L   CO2 27 19 - 32 mEq/L   Glucose, Bld 105 (H) 70 - 99 mg/dL   BUN 17 6 - 23 mg/dL   Creatinine, Ser 0.7 0.4 - 1.2 mg/dL   Total Bilirubin 0.6 0.2 - 1.2 mg/dL   Alkaline Phosphatase 89 39 - 117 U/L   AST 20 0 - 37 U/L   ALT 20 0 - 35 U/L   Total Protein 6.6 6.0 - 8.3 g/dL   Albumin 3.8 3.5 - 5.2 g/dL   Calcium 8.7 8.4 - 10.5 mg/dL   GFR 86.93 >60.00 mL/min  CBC     Status: None   Collection Time: 06/10/14 12:00 PM  Result Value Ref Range   WBC 7.1 4.0 - 10.5 K/uL   RBC 4.86 3.87 - 5.11 MIL/uL   Hemoglobin 13.9 12.0 - 15.0 g/dL   HCT 43.2 36.0 - 46.0 %   MCV 88.9 78.0 - 100.0 fL   MCH 28.6 26.0 - 34.0 pg   MCHC 32.2 30.0 - 36.0 g/dL   RDW 13.6 11.5 - 15.5 %   Platelets 260 150 - 400 K/uL  Basic metabolic panel     Status: Abnormal   Collection Time: 06/10/14 12:00 PM  Result Value Ref Range   Sodium 138 135 - 145 mmol/L    Comment: Please note change in reference range.   Potassium 3.6 3.5 - 5.1 mmol/L    Comment: Please note change in reference range.   Chloride 105 96 - 112 mEq/L   CO2 28 19 - 32 mmol/L   Glucose, Bld 112 (H) 70 - 99 mg/dL   BUN 16 6 - 23 mg/dL   Creatinine, Ser 0.70 0.50 - 1.10 mg/dL   Calcium 9.1 8.4 - 10.5 mg/dL   GFR calc non Af Amer >90 >90 mL/min   GFR calc Af Amer >90 >90 mL/min    Comment: (NOTE) The eGFR has been calculated using the CKD EPI equation. This calculation has not been validated in all clinical situations. eGFR's persistently <90 mL/min signify possible Chronic Kidney Disease.    Anion gap 5 5 - 15  I-stat troponin, ED (not at [facility])     Status: None   Collection Time: 06/10/14  1:23 PM  Result Value Ref Range    Troponin i, poc 0.01 0.00 - 0.08 ng/mL   Comment 3            Comment: Due to the release kinetics of cTnI, a negative result within the first hours of the onset of symptoms does not rule out myocardial infarction with certainty. If myocardial infarction is still suspected, repeat the test at appropriate intervals.   I-stat troponin, ED  Status: None   Collection Time: 06/10/14  4:24 PM  Result Value Ref Range   Troponin i, poc 0.00 0.00 - 0.08 ng/mL   Comment 3            Comment: Due to the release kinetics of cTnI, a negative result within the first hours of the onset of symptoms does not rule out myocardial infarction with certainty. If myocardial infarction is still suspected, repeat the test at appropriate intervals.   CBC w/Diff     Status: Abnormal   Collection Time: 07/17/14  2:12 PM  Result Value Ref Range   WBC 8.2 4.0 - 10.5 K/uL   RBC 4.61 3.87 - 5.11 Mil/uL   Hemoglobin 13.2 12.0 - 15.0 g/dL   HCT 39.5 36.0 - 46.0 %   MCV 85.5 78.0 - 100.0 fl   MCHC 33.6 30.0 - 36.0 g/dL   RDW 14.1 11.5 - 15.5 %   Platelets 428.0 (H) 150.0 - 400.0 K/uL   Neutrophils Relative % 69.6 43.0 - 77.0 %   Lymphocytes Relative 20.1 12.0 - 46.0 %   Monocytes Relative 7.6 3.0 - 12.0 %   Eosinophils Relative 2.4 0.0 - 5.0 %   Basophils Relative 0.3 0.0 - 3.0 %   Neutro Abs 5.7 1.4 - 7.7 K/uL   Lymphs Abs 1.6 0.7 - 4.0 K/uL   Monocytes Absolute 0.6 0.1 - 1.0 K/uL   Eosinophils Absolute 0.2 0.0 - 0.7 K/uL   Basophils Absolute 0.0 0.0 - 0.1 K/uL  Comp Met (CMET)     Status: None   Collection Time: 07/17/14  2:12 PM  Result Value Ref Range   Sodium 140 135 - 145 mEq/L   Potassium 4.1 3.5 - 5.1 mEq/L   Chloride 102 96 - 112 mEq/L   CO2 30 19 - 32 mEq/L   Glucose, Bld 92 70 - 99 mg/dL   BUN 16 6 - 23 mg/dL   Creatinine, Ser 0.59 0.40 - 1.20 mg/dL   Total Bilirubin 0.6 0.2 - 1.2 mg/dL   Alkaline Phosphatase 116 39 - 117 U/L   AST 16 0 - 37 U/L   ALT 16 0 - 35 U/L   Total Protein  7.6 6.0 - 8.3 g/dL   Albumin 4.3 3.5 - 5.2 g/dL   Calcium 9.9 8.4 - 10.5 mg/dL   GFR 109.34 >60.00 mL/min  TSH     Status: None   Collection Time: 07/17/14  2:12 PM  Result Value Ref Range   TSH 1.60 0.35 - 4.50 uIU/mL  T4, free     Status: None   Collection Time: 07/17/14  2:12 PM  Result Value Ref Range   Free T4 0.95 0.60 - 1.60 ng/dL  Epstein-Barr Virus VCA Antibody Panel     Status: Abnormal   Collection Time: 07/17/14  2:12 PM  Result Value Ref Range   EBV VCA IgG 555.0 (H) <18.0 U/mL    Comment:   Reference Range:       <18.0 U/mL = Negative                    18.0-21.9 U/mL = Equivocal                       >=22.0 U/mL = Positive    EBV VCA IgM <10.0 <36.0 U/mL    Comment:   Reference Range:       <36.0 U/mL = Negative  36.0-43.9 U/mL = Equivocal                       >=44.0 U/mL = Positive    EBV EA IgG 8.8 <9.0 U/mL    Comment:   Reference Range:        <9.0 U/mL = Negative                     9.0-10.9 U/mL = Equivocal                       >=11.0 U/mL = Positive   Assay cross-reactivity for Early Antigen (EA) has been noted with some specimens containing antibody to Human Immunodeficiency Virus (HIV). HIV disease must be excluded before confirmation of EBV diagnosis.      EBV NA IgG 525.0 (H) <18.0 U/mL    Comment:   Reference Range:       <18.0 U/mL = Negative                    18.0-21.9 U/mL = Equivocal                       >=22.0 U/mL = Positive          Clinical Stage            VCA IgG   VCA IgM      EA    EBV NA          Susceptibility               -         -         -       -        Very Early Infection        +/-       +/-        -       -        Established Infection        +         +        +/-      -        Recent Infection             +         +        +/-     +/-        Past Infection               +         -        +/-      +                                                                               +/- means  positive or negative (not weak) High persisting antibody levels may be present in Burkitt's lymphoma and nasopharyngeal carcinoma.   CMV IgM     Status: None   Collection Time: 07/17/14  2:12 PM  Result Value Ref Range  CMV IgM <8.00 <30.00 AU/mL    Comment:   Reference Range:        <30.00 AU/mL = Negative                    30.00-34.99 AU/mL = Equivocal                        >=35.00 AU/mL = Positive   Results from any one IgM assay should not be used as a sole determinant of a current or recent infection. Because an IgM test can yield false positive results and low levels of IgM antibody may persist for more than 12 months post infection, reliance on a single test result could be misleading. If an acute infection is suspected, consider obtaining a new specimen and submit for both IgG and IgM testing in two or more weeks.     Cytomegalovirus antibody, IgG     Status: Abnormal   Collection Time: 07/17/14  2:12 PM  Result Value Ref Range   Cytomegalovirus Ab-IgG >10.00 (H) <0.60 U/mL    Comment:   Reference Range:          <0.60 U/mL = Negative                       0.60-0.69 U/mL = Equivocal                          >=0.70 U/mL = Positive    A positive result indicates that the patient has antibodies to CMV. It does not differentiate between an active or past infection. The clinical diagnosis must be interpreted in conjunction with the clinical signs and symptoms of the patient.     CRP High sensitivity     Status: Abnormal   Collection Time: 07/17/14  2:12 PM  Result Value Ref Range   CRP, High Sensitivity 76.800 (H) 0.000 - 5.000 mg/L    Comment: Note:  An elevated hs-CRP (>5 mg/L) should be repeated after 2 weeks to rule out recent infection or trauma.  Urinalysis, Routine w reflex microscopic     Status: Abnormal   Collection Time: 07/17/14  2:12 PM  Result Value Ref Range   Color, Urine Light Orange (A) Yellow;Lt. Yellow   APPearance Turbid (A) Clear   Specific  Gravity, Urine >=1.030 (A) 1.000 - 1.030   pH 5.5 5.0 - 8.0   Total Protein, Urine NEGATIVE Negative   Urine Glucose NEGATIVE Negative   Ketones, ur NEGATIVE Negative   Bilirubin Urine NEGATIVE Negative   Hgb urine dipstick NEGATIVE Negative   Urobilinogen, UA 0.2 0.0 - 1.0   Leukocytes, UA NEGATIVE Negative   Nitrite NEGATIVE Negative   WBC, UA 0-2/hpf 0-2/hpf   RBC / HPF 0-2/hpf 0-2/hpf  CULTURE, URINE COMPREHENSIVE     Status: None   Collection Time: 07/17/14  2:12 PM  Result Value Ref Range   Colony Count 50,000 COLONIES/ML    Organism ID, Bacteria LACTOBACILLUS SPECIES     Comment: Standardized susceptibility testing for this organism is not available.   CBC w/Diff     Status: Abnormal   Collection Time: 07/24/14  1:10 PM  Result Value Ref Range   WBC 7.8 4.0 - 10.5 K/uL   RBC 4.55 3.87 - 5.11 Mil/uL   Hemoglobin 13.0 12.0 - 15.0 g/dL   HCT 38.8 36.0 - 46.0 %   MCV 85.3 78.0 - 100.0  fl   MCHC 33.4 30.0 - 36.0 g/dL   RDW 14.0 11.5 - 15.5 %   Platelets 431.0 (H) 150.0 - 400.0 K/uL   Neutrophils Relative % 68.9 43.0 - 77.0 %   Lymphocytes Relative 19.0 12.0 - 46.0 %   Monocytes Relative 8.6 3.0 - 12.0 %   Eosinophils Relative 2.8 0.0 - 5.0 %   Basophils Relative 0.7 0.0 - 3.0 %   Neutro Abs 5.4 1.4 - 7.7 K/uL   Lymphs Abs 1.5 0.7 - 4.0 K/uL   Monocytes Absolute 0.7 0.1 - 1.0 K/uL   Eosinophils Absolute 0.2 0.0 - 0.7 K/uL   Basophils Absolute 0.1 0.0 - 0.1 K/uL  Antinuclear Antib (ANA)     Status: Abnormal   Collection Time: 07/24/14  1:10 PM  Result Value Ref Range   Anit Nuclear Antibody(ANA) POS (A) NEGATIVE  Rheumatoid Factor     Status: None   Collection Time: 07/24/14  1:10 PM  Result Value Ref Range   Rhuematoid fact SerPl-aCnc <10 <=14 IU/mL    Comment:                            Interpretive Table                     Low Positive: 15 - 41 IU/mL                     High Positive:  >= 42 IU/mL    In addition to the RF result, and clinical symptoms  including joint  involvement, the 2010 ACR Classification Criteria for  scoring/diagnosing Rheumatoid Arthritis include the results of the  following tests:  CRP (82423), ESR (15010), and CCP (APCA) (53614).  www.rheumatology.org/practice/clinical/classification/ra/ra_2010.asp   Anti-nuclear ab-titer (ANA titer)     Status: None   Collection Time: 07/24/14  1:10 PM  Result Value Ref Range   ANA Titer 1 NEG <1:40      Comment:   Reference Ranges: 1:40 - 1:80 Weakly positive, usually not clinically significant. > or = to 1:160 Result may be clinically significant.                                                                           Due to differences in methodologies, results may differ between the  ANA screen and the Reflex IFA titer and pattern.                                                                           ANA Pattern 1 SEE NOTE     Comment:   Pattern not applicable due to a negative titer result.     Assessment/Plan: Fever of unknown origin Unclear etiology.  Original lab workup including -- CBC w diff, UA, Urine culture, TSH, EBV panel and CMV panel were negative for acute findings.  Hs-CRP elevated, prompting further workup with  CXR, RF and ANA.  CXR and RF negative. ANA Ab positive but titer negative.  Due to inconclusive findings but persistent symptoms, + hs-CRP and + ANA, will refer to Rheumatology for further assessment.

## 2014-07-28 NOTE — Assessment & Plan Note (Signed)
Unclear etiology.  Original lab workup including -- CBC w diff, UA, Urine culture, TSH, EBV panel and CMV panel were negative for acute findings.  Hs-CRP elevated, prompting further workup with CXR, RF and ANA.  CXR and RF negative. ANA Ab positive but titer negative.  Due to inconclusive findings but persistent symptoms, + hs-CRP and + ANA, will refer to Rheumatology for further assessment.

## 2014-09-02 ENCOUNTER — Telehealth: Payer: Self-pay | Admitting: Physician Assistant

## 2014-09-02 DIAGNOSIS — G4700 Insomnia, unspecified: Secondary | ICD-10-CM

## 2014-09-02 DIAGNOSIS — F411 Generalized anxiety disorder: Secondary | ICD-10-CM

## 2014-09-02 MED ORDER — ALPRAZOLAM 0.25 MG PO TABS: ORAL_TABLET | ORAL | Status: DC

## 2014-09-02 NOTE — Telephone Encounter (Signed)
Rx request faxed to pharmacy; Patient informed, understood/SLS

## 2014-09-02 NOTE — Telephone Encounter (Signed)
Medication Detail      Disp Refills Start End     ALPRAZolam (XANAX) 0.25 MG tablet 30 tablet 0 07/17/2014     Sig: take 1 tablet by mouth at bedtime if needed    Class: Print   Associated Diagnoses    Insomnia      Anxiety state     Pharmacy    [facility], [place]

## 2014-09-02 NOTE — Telephone Encounter (Signed)
Ok to fax refill to her pharmacy.

## 2014-09-02 NOTE — Telephone Encounter (Signed)
Caller name: Avalee, Castrellon Relation to pt: self  Call back [phone]   Reason for call:  Pt states she never filled ALPRAZolam (XANAX) 0.25 MG tablet and would like to fill RX not but cant find script. Please advise

## 2014-09-29 ENCOUNTER — Other Ambulatory Visit: Payer: Self-pay

## 2014-09-29 MED ORDER — METOPROLOL TARTRATE 25 MG PO TABS: 12.5000 mg | ORAL_TABLET | Freq: Two times a day (BID) | ORAL | Status: DC

## 2014-11-18 ENCOUNTER — Encounter: Payer: Self-pay | Admitting: Internal Medicine

## 2014-11-18 ENCOUNTER — Telehealth: Payer: Self-pay | Admitting: Cardiology

## 2014-11-18 NOTE — Telephone Encounter (Signed)
NeW message  Pt calling to speak w/ Rn about when she needs to be seen. No recall exists in system- pt does not know when to make appt. Has sally appt in late July. Please call back and discuss.

## 2014-11-18 NOTE — Telephone Encounter (Signed)
Pt states she is not having any symtoms, requesting follow up appt with Dr Aundra Dubin. Pt scheduled to see Dr Aundra Dubin 02/17/15, pt satisfied with this appt,pt declined sooner appt with PA/NP.

## 2014-12-07 ENCOUNTER — Other Ambulatory Visit: Payer: BC Managed Care – PPO

## 2014-12-10 ENCOUNTER — Ambulatory Visit: Payer: BC Managed Care – PPO | Admitting: Pharmacist

## 2014-12-26 ENCOUNTER — Other Ambulatory Visit: Payer: Self-pay | Admitting: Cardiology

## 2015-01-11 ENCOUNTER — Encounter: Payer: Self-pay | Admitting: Pharmacist

## 2015-01-11 ENCOUNTER — Other Ambulatory Visit (INDEPENDENT_AMBULATORY_CARE_PROVIDER_SITE_OTHER): Payer: BLUE CROSS/BLUE SHIELD | Admitting: *Deleted

## 2015-01-11 DIAGNOSIS — E785 Hyperlipidemia, unspecified: Secondary | ICD-10-CM

## 2015-01-11 LAB — LIPID PANEL
Cholesterol: 183 mg/dL (ref 0–200)
HDL: 54.4 mg/dL (ref >39.00)
LDL Cholesterol: 102 mg/dL — ABNORMAL HIGH (ref 0–99)
NonHDL: 128.6
Total CHOL/HDL Ratio: 3
Triglycerides: 133 mg/dL (ref 0.0–149.0)
VLDL: 26.6 mg/dL (ref 0.0–40.0)

## 2015-01-11 LAB — HEPATIC FUNCTION PANEL
ALBUMIN: 4.3 g/dL (ref 3.5–5.2)
ALT: 25 U/L (ref 0–35)
AST: 21 U/L (ref 0–37)
Alkaline Phosphatase: 86 U/L (ref 39–117)
BILIRUBIN TOTAL: 0.5 mg/dL (ref 0.2–1.2)
Bilirubin, Direct: 0.1 mg/dL (ref 0.0–0.3)
Total Protein: 6.7 g/dL (ref 6.0–8.3)

## 2015-01-11 NOTE — Telephone Encounter (Deleted)
Called pt re: lipid visit tomorrow. Patient does not have a recent lipid panel - left message for patient to come in for labs tomorrow 7/26 instead of lipid appointment. Will call patient with results and schedule f/u appointment in person if needed.

## 2015-01-11 NOTE — Telephone Encounter (Signed)
This encounter was created in error - please disregard.

## 2015-01-11 NOTE — Addendum Note (Signed)
Addended by: Eulis Foster on: 01/11/2015 11:37 AM   Modules accepted: Orders

## 2015-01-12 ENCOUNTER — Ambulatory Visit (INDEPENDENT_AMBULATORY_CARE_PROVIDER_SITE_OTHER): Payer: BLUE CROSS/BLUE SHIELD | Admitting: Pharmacist

## 2015-01-12 ENCOUNTER — Other Ambulatory Visit: Payer: BLUE CROSS/BLUE SHIELD

## 2015-01-12 DIAGNOSIS — E785 Hyperlipidemia, unspecified: Secondary | ICD-10-CM

## 2015-01-12 NOTE — Progress Notes (Signed)
History of Present Illness: Emily [name] is a 64 yo F with history of CAD s/p stent placement to third obtuse marginal, here for follow up of her hyperlipidemia. Pt stated that she has had a stressful year so far. Her classes in the spring were difficult, especially math. She just finished a psychology class over the summer that was hard. Her daughter was married in May and the wedding planning was also a source of stress. Patient admits she could be taking better care of herself but she is just so stressed. Her LDL is at 102; this is higher than her goal of 70.  She is very resistant to changing or adding prescription medication, and is very concerned about side effects coming from prescription meds.  I discussed changing her to Crestor 40 mg, which was Dr.McLean's recommendation, however she declines making this change at this time.  We also discussed Zetia addition but she is not interested in that at this time either, although she states that she will keep this option in consideration.    Diet: She states her diet has not changed much, and has been trying to eat healthier. She uses her fit bit and phone app to count calories/track sleep (<1200 kcal/day).  She usually only eats 2 meals per day but does stay below 1200 kcal/day most days.   Exercise: Patient has not done anything for exercise in over a year now.  She is getting in about 5000 steps per day on her fit bit.   Labs:   12/2014: TC 183, LDL 102, TG 133, HDL 54, LFTs normal (Lipitor 80 mg qd, fish oil) 05/2014: TC 172, LDL 90, TG 178, HDL 47, LFTs normal (Lipitor 80mg  qd, fish oil)  11/2013:  TC 188, LDL 106, TG 170, HDL 48, LFTs normal (Lipitor 80 mg qd, fish oil)  Current Outpatient Prescriptions  Medication Sig Dispense Refill  . Acetylcarnitine HCl (ACETYL L-CARNITINE) 250 MG CAPS Take by mouth. 200 mg every other day    . albuterol (PROVENTIL HFA;VENTOLIN HFA) 108 (90 BASE) MCG/ACT inhaler Inhale 2 puffs into the lungs every 6  (six) hours as needed for wheezing or shortness of breath. 1 Inhaler 0  . ALPRAZolam (XANAX) 0.25 MG tablet take 1 tablet by mouth at bedtime if needed 30 tablet 0  . atorvastatin (LIPITOR) 80 MG tablet take 1 tablet by mouth once daily 90 tablet 1  . b complex vitamins tablet Take 1 tablet by mouth daily.      . Biotin 300 MCG TABS Take 600 mcg by mouth every other day.      . Calcium-Magnesium-Zinc 500-250-12.5 MG TABS Take 1 tablet by mouth every other day.      . Chromium Picolinate 200 MCG TABS Take 1 tablet by mouth every other day.      . clopidogrel (PLAVIX) 75 MG tablet take 1 tablet by mouth once daily 90 tablet 1  . Coenzyme Q10 (COQ10) 200 MG CAPS Take 1 capsule by mouth daily.      Marland Kitchen CRANBERRY JUICE EXTRACT PO Take 500 mg by mouth daily.      . Flaxseed, Linseed, (FLAX SEED OIL) 1000 MG CAPS Take 1,200 mg by mouth daily.      . Garlic Oil 258 MG TABS Take 1 tablet by mouth daily.      Nyoka Cowden Tea, Camillia sinensis, 315 MG CAPS Take 1 capsule by mouth daily.      . metoprolol tartrate (LOPRESSOR) 25 MG tablet Take 0.5  tablets (12.5 mg total) by mouth 2 (two) times daily. 90 tablet 0  . nitroGLYCERIN (NITROSTAT) 0.4 MG SL tablet Place 1 tablet (0.4 mg total) under the tongue every 5 (five) minutes as needed. 25 tablet 6  . Omega-3 Fatty Acids (FISH OIL) 1200 MG CAPS Take 1 capsule by mouth daily.      . Rhodiola 300 MG CAPS Take 1 capsule by mouth daily.       No current facility-administered medications for this visit.   Assessment and Plan: Hyperlipidemia - Patient's LDL is still elevated above goal of 70mg /dL. She is on max dose of Lipitor. While she understands that her LDL level is still higher than what it should be, she does not want to make any changes at this time. States that she will continue to work on improving her lifestyle, including diet and exercise. Feels that she will be able to better work on these factors once she is done with school in December. Will continue  her current regimen. Re-check labs in 6 months.

## 2015-01-12 NOTE — Patient Instructions (Signed)
Continue your current medications.  We will recheck your labs in January  Next Lipid Clinic Appt: 07/06/2015 at 2pm.

## 2015-01-15 ENCOUNTER — Telehealth: Payer: Self-pay

## 2015-01-20 NOTE — Telephone Encounter (Signed)
01/15/15 (late entry 01/11/15) Call from pt who stated she noticed a "scratch" on her arm after having her blood drawn. Pt texted Probation officer a photo of a scratch in her left antecubital space. Pt denied needing additional medical care.    By Chapman Moss, RN

## 2015-01-24 ENCOUNTER — Other Ambulatory Visit: Payer: Self-pay | Admitting: Physician Assistant

## 2015-01-26 ENCOUNTER — Ambulatory Visit: Payer: Self-pay | Admitting: Internal Medicine

## 2015-02-17 ENCOUNTER — Ambulatory Visit (INDEPENDENT_AMBULATORY_CARE_PROVIDER_SITE_OTHER): Payer: BLUE CROSS/BLUE SHIELD | Admitting: Cardiology

## 2015-02-17 ENCOUNTER — Encounter: Payer: Self-pay | Admitting: Cardiology

## 2015-02-17 ENCOUNTER — Encounter: Payer: Self-pay | Admitting: *Deleted

## 2015-02-17 VITALS — BP 138/70 | HR 61 | Ht 61.5 in | Wt 164.5 lb

## 2015-02-17 DIAGNOSIS — I351 Nonrheumatic aortic (valve) insufficiency: Secondary | ICD-10-CM

## 2015-02-17 DIAGNOSIS — I2510 Atherosclerotic heart disease of native coronary artery without angina pectoris: Secondary | ICD-10-CM

## 2015-02-17 NOTE — Patient Instructions (Signed)
Medication Instructions: No changes today  Labwork: None today  Testing/Procedures: Your physician has requested that you have an echocardiogram. Echocardiography is a painless test that uses sound waves to create images of your heart. It provides your doctor with information about the size and shape of your heart and how well your heart's chambers and valves are working. This procedure takes approximately one hour. There are no restrictions for this procedure.     Follow-Up: Your physician wants you to follow-up in: May 2017. You will receive a reminder letter in the mail two months in advance. If you don't receive a letter, please call our office to schedule the follow-up appointment.

## 2015-02-18 NOTE — Progress Notes (Signed)
Patient ID: Emily [name], female   DOB: 09-18-1950, 64 y.o.   MRN: [id number] PCP: Elyn Aquas  64 yo with history of CAD s/p inferior MI in 1/09 treated with PCI to OM3.  She has had no cardiac events since that time.  Echo in 3/14 showed preserved EF with mild to moderate aortic insufficiency.  Symptomatically, she has been stable.  She is taking classes at [employer], one more semester to go.  Weight is down 5 lbs.  No exertional chest pain and no exertional dyspnea.  No orthopnea/PND.  She has been on Plavix long-term since PCI.  She has stopped taking aspirin (no longer wants to take).    ECG: NSR, normal  Labs (12/14): LDL 90, HDL 41 Labs (6/15): LDL 106, HDL 48 Labs (7/16): LDL 102  PMH: 1. CAD: Inferior MI in 1/09 with culprit vessel a large OM3.  This was treated with PCI.  Had residual 50-70% mLAD stenosis.  Echo (3/14) with EF 60-65%, mild-moderate AI 2. HTN 3. H/o fen-phen use 4. Aortic insufficiency: Mild to moderate on echo in 3/14.  5. Carotid dopplers (9/12) with mild plaque.  6. Hyperlipidemia  SH: Married with 2 children.  She is taking classes at [employer]  Husband retired.  Quit smoking in 9/08.    FH: No CAD.  Mother with hemorrhagic CVA.  ROS: All systems reviewed and negative except as per HPI.   Current Outpatient Prescriptions  Medication Sig Dispense Refill  . Acetylcarnitine HCl (ACETYL L-CARNITINE) 250 MG CAPS Take by mouth. 200 mg every other day    . albuterol (PROVENTIL HFA;VENTOLIN HFA) 108 (90 BASE) MCG/ACT inhaler Inhale 2 puffs into the lungs every 6 (six) hours as needed for wheezing or shortness of breath. 1 Inhaler 0  . ALPRAZolam (XANAX) 0.25 MG tablet take 1 tablet by mouth at bedtime if needed 30 tablet 2  . atorvastatin (LIPITOR) 80 MG tablet take 1 tablet by mouth once daily 90 tablet 1  . b complex vitamins tablet Take 1 tablet by mouth daily.      . Biotin 300 MCG TABS Take 600 mcg by mouth every other day.      . Calcium-Magnesium-Zinc  500-250-12.5 MG TABS Take 1 tablet by mouth every other day.      . Chromium Picolinate 200 MCG TABS Take 1 tablet by mouth every other day.      . clopidogrel (PLAVIX) 75 MG tablet take 1 tablet by mouth once daily 90 tablet 1  . Coenzyme Q10 (COQ10) 200 MG CAPS Take 1 capsule by mouth daily.      Marland Kitchen CRANBERRY JUICE EXTRACT PO Take 500 mg by mouth daily.      . Flaxseed, Linseed, (FLAX SEED OIL) 1000 MG CAPS Take 1,200 mg by mouth daily.      . Garlic Oil 659 MG TABS Take 1 tablet by mouth daily.      Nyoka Cowden Tea, Camillia sinensis, 315 MG CAPS Take 1 capsule by mouth daily.      . metoprolol tartrate (LOPRESSOR) 25 MG tablet Take 0.5 tablets (12.5 mg total) by mouth 2 (two) times daily. 90 tablet 0  . nitroGLYCERIN (NITROSTAT) 0.4 MG SL tablet Place 1 tablet (0.4 mg total) under the tongue every 5 (five) minutes as needed. 25 tablet 6  . Omega-3 Fatty Acids (FISH OIL) 1200 MG CAPS Take 1 capsule by mouth daily.      . Rhodiola 300 MG CAPS Take 1 capsule by mouth daily.  No current facility-administered medications for this visit.    BP 138/70 mmHg  Pulse 61  Ht 5' 1.5" (1.562 m)  Wt 164 lb 8 oz (74.617 kg)  BMI 30.58 kg/m2 General: NAD Neck: No JVD, no thyromegaly or thyroid nodule.  Lungs: Clear to auscultation bilaterally with normal respiratory effort. CV: Nondisplaced PMI.  Heart regular S1/S2, no S3/S4, 2/6 early SEM.  No peripheral edema.  No carotid bruit.  Normal pedal pulses.  Abdomen: Soft, nontender, no hepatosplenomegaly, no distention.  Skin: Intact without lesions or rashes.  Neurologic: Alert and oriented x 3.  Psych: Normal affect. Extremities: No clubbing or cyanosis.   Assessment/Plan: 1. CAD: Stable with no ischemic symptoms.  She had PCI in 1/09 in the setting of inferior MI.  She had residual untreated 50-70% mLAD stenosis.   - Continue statin and metoprolol. - She has been on Plavix long-term with no evidence for GI bleeding.   2. Hyperlipidemia: Goal  LDL < 70.  I have suggested that she stop atorvastatin and change to Crestor 40 or continue atorvastatin and add Zetia 10 daily.  She wants to continue to try exercise, diet and weight loss.  She is followed in lipids clinic.   3. Aortic insufficiency: Mild to moderate on 3/14 echo.  Repeat echo to follow AI.    [name] 02/18/2015

## 2015-02-25 IMAGING — CR DG CHEST 2V
2 series · 2 of 2 positions shown · non-contrast
Comparison: Prior chest x-ray 06/10/2014.

CLINICAL DATA: 63-year-old female with 1 month history of fever.
Prior history includes smoking and heart disease

EXAM:
CHEST  2 VIEW

[view not recorded (1 of 2)]
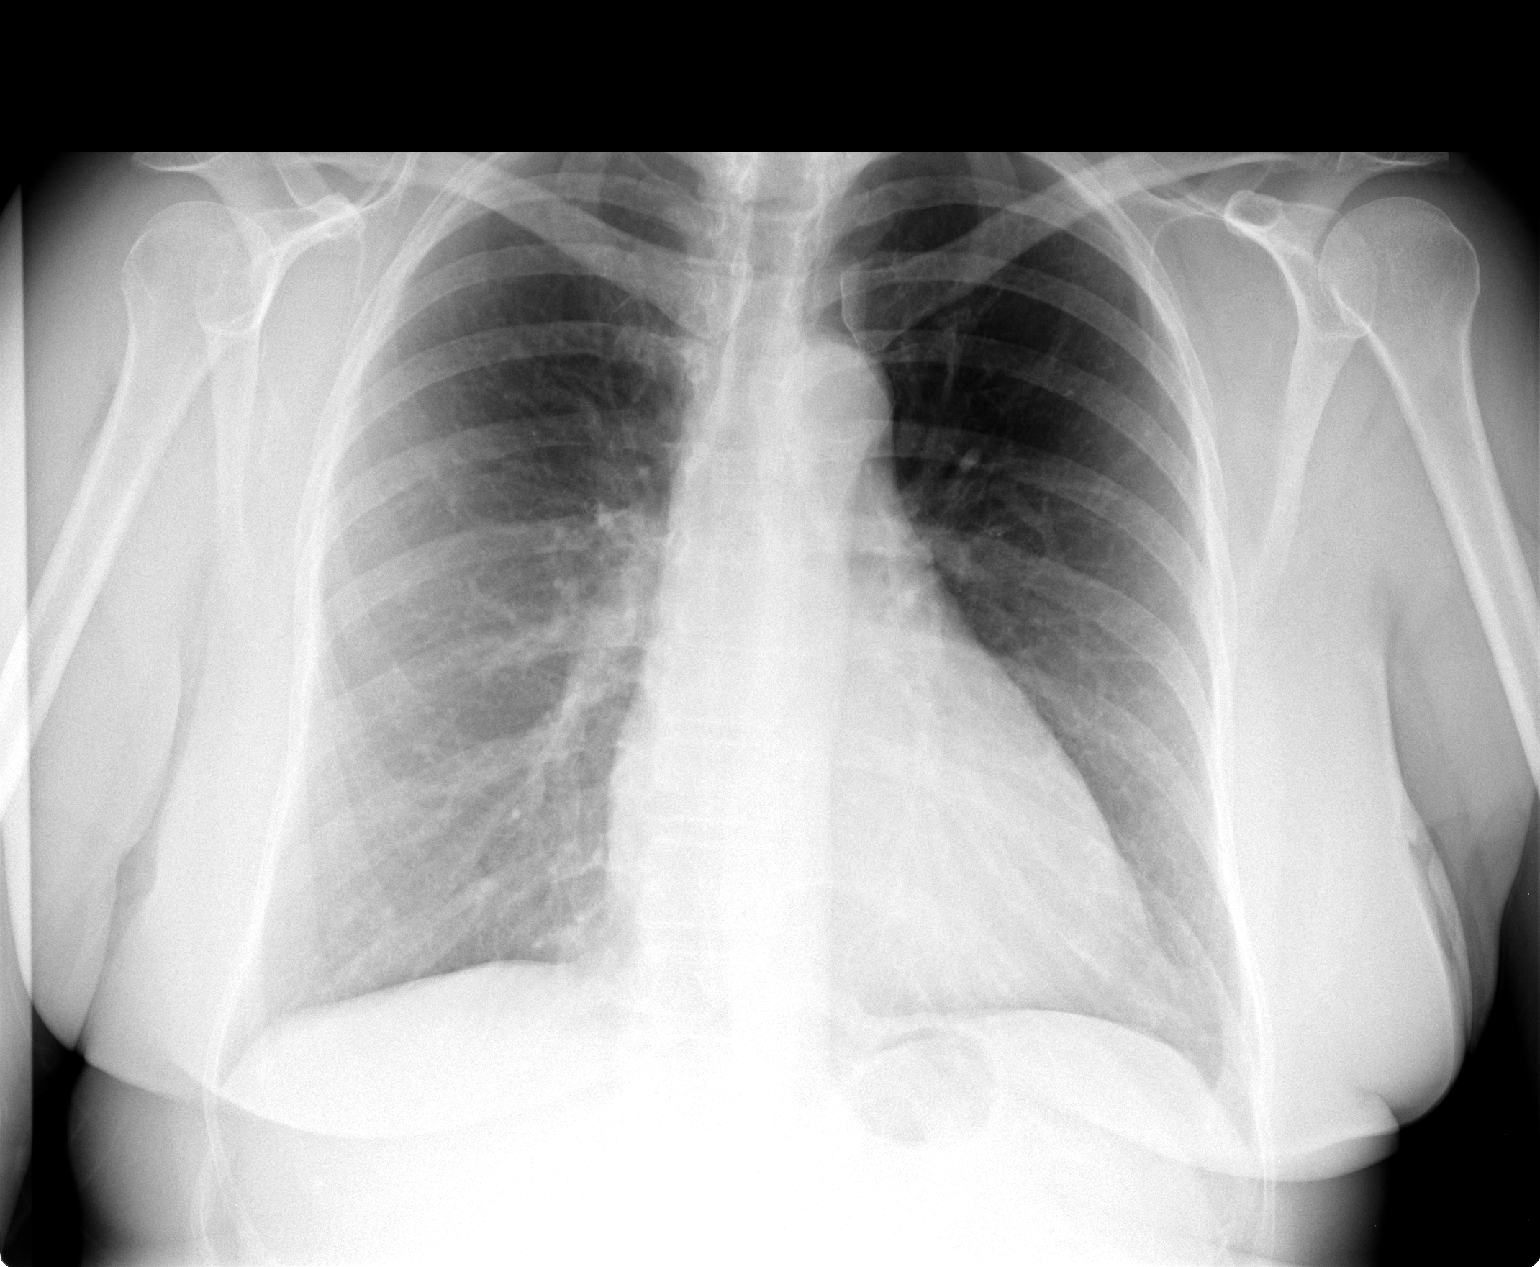

[view not recorded (2 of 2)]
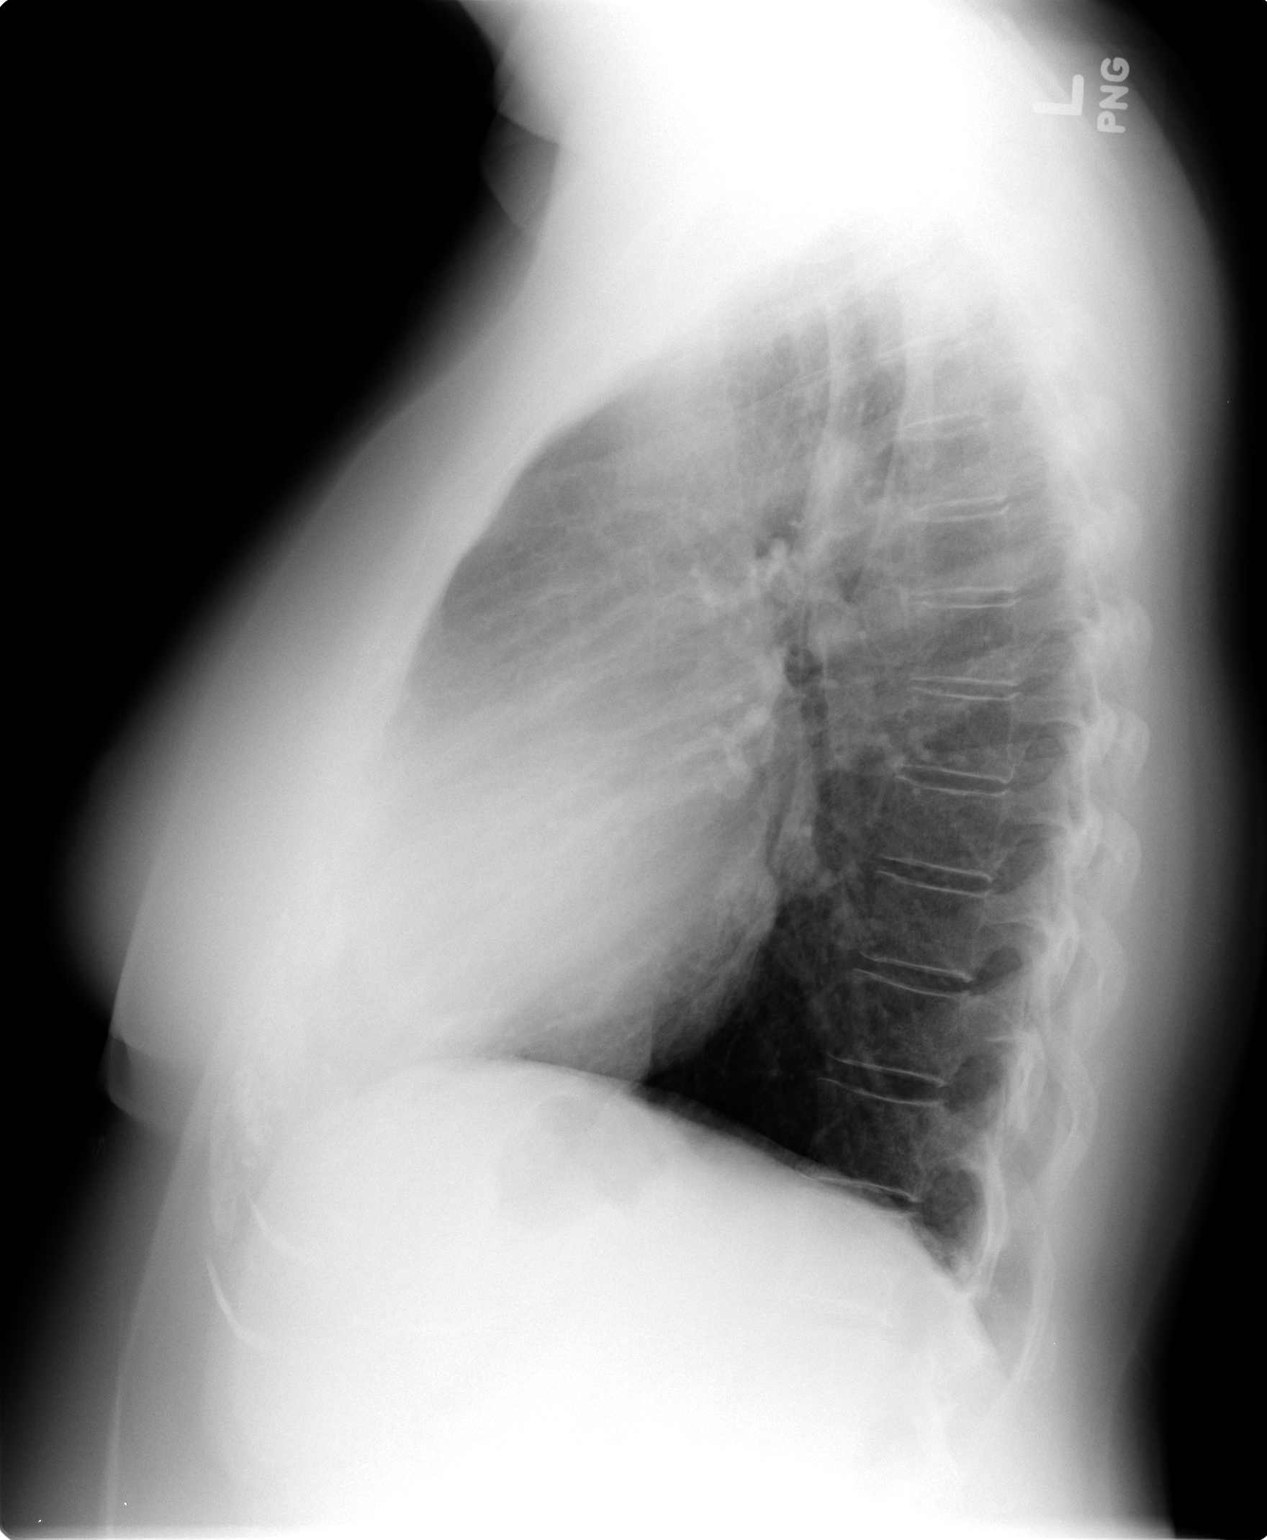

[2 of 2 positions shown; findings below may reference images not displayed]

FINDINGS: Cardiac and mediastinal contours remain unchanged within normal
limits. Trace atherosclerotic calcification in the transverse aorta.
No evidence of focal airspace consolidation, pulmonary edema,
pleural effusion or pneumothorax. Mild central bronchitic changes
remain unchanged. No acute osseous abnormality.
IMPRESSION: No active cardiopulmonary disease.

## 2015-03-04 ENCOUNTER — Ambulatory Visit (HOSPITAL_COMMUNITY): Payer: BLUE CROSS/BLUE SHIELD | Attending: Cardiology

## 2015-03-04 ENCOUNTER — Other Ambulatory Visit: Payer: Self-pay

## 2015-03-04 DIAGNOSIS — I351 Nonrheumatic aortic (valve) insufficiency: Secondary | ICD-10-CM

## 2015-03-04 DIAGNOSIS — R29898 Other symptoms and signs involving the musculoskeletal system: Secondary | ICD-10-CM | POA: Diagnosis not present

## 2015-03-04 DIAGNOSIS — E785 Hyperlipidemia, unspecified: Secondary | ICD-10-CM | POA: Insufficient documentation

## 2015-03-04 DIAGNOSIS — I10 Essential (primary) hypertension: Secondary | ICD-10-CM | POA: Diagnosis not present

## 2015-03-04 DIAGNOSIS — I358 Other nonrheumatic aortic valve disorders: Secondary | ICD-10-CM | POA: Insufficient documentation

## 2015-03-23 ENCOUNTER — Encounter: Payer: Self-pay | Admitting: Gastroenterology

## 2015-03-23 ENCOUNTER — Ambulatory Visit (INDEPENDENT_AMBULATORY_CARE_PROVIDER_SITE_OTHER): Payer: BLUE CROSS/BLUE SHIELD | Admitting: Gastroenterology

## 2015-03-23 VITALS — BP 106/68 | HR 72 | Ht 61.61 in | Wt 164.5 lb

## 2015-03-23 DIAGNOSIS — Z79899 Other long term (current) drug therapy: Secondary | ICD-10-CM

## 2015-03-23 DIAGNOSIS — Z1211 Encounter for screening for malignant neoplasm of colon: Secondary | ICD-10-CM

## 2015-03-23 DIAGNOSIS — K219 Gastro-esophageal reflux disease without esophagitis: Secondary | ICD-10-CM | POA: Diagnosis not present

## 2015-03-23 MED ORDER — RANITIDINE HCL 150 MG PO TABS: 150.0000 mg | ORAL_TABLET | Freq: Two times a day (BID) | ORAL | Status: DC | PRN

## 2015-03-23 NOTE — Progress Notes (Signed)
HPI :  64 y/o female here for colonoscopy screening. Her last colonoscopy was in 2007 in which a hyperplastic polyp was removed. No adenomas. She denies blood in the stools. No constipation or diarrhea routinely. She has some food intolerances which can cause loose stools but no chronic symptoms. No abdominal pains. No weight loss. NO FH of colon cancer. She denies overt incontinence but endorses some "fecal leakage" at times after a bowel movement. No rectal discomfort. The patient takes plavix since 2009, for MI, coronary stents in place. No chest pains or shortness of breath at present time. She follows with cardiology, had a recent echocardiogram. She has otherwise has a history of periectal abscess in 1970s without recurrence. No tobacco use currently, quit 2008.   Patient also reports a history of longstanding GERD but for the most part has not taken anything routinely, only TUMS PRN or other OTC antacids. She reports when she eats tomatoe based foods she has heartburn / regurgitation. She reports this is usually associated with certain foods. She denies routine dysphagia. She has been on aciphex previously but scared of side effects of PPIs. She mostly has symptoms when she has dietary indisgression. She is having symptoms more frequently than previous. She asks about screening for Barrett's given her history of tobacco use.    Past Medical History  Diagnosis Date  . Chronic insomnia   . Perirectal abscess   . Colon polyp   . Anxiety   . Depression   . Hypertension   . CAD (coronary artery disease)   . Heart attack ([place]) 1-09  . Hyperlipemia      Past Surgical History  Procedure Laterality Date  . Cesarean section      x 2  . Ptca with stent to third obtuse marginal    . Colonoscopy w/ polypectomy  2007  . Incision and drainage abscess anal     Family History  Problem Relation Age of Onset  . Skin cancer Mother   . Allergy (severe) Father   . Renal Disease Paternal  Grandfather     one removed    Social History  Substance Use Topics  . Smoking status: Former Smoker -- 2.00 packs/day for 20 years    Types: Cigarettes    Quit date: 06/19/2006  . Smokeless tobacco: Never Used  . Alcohol Use: 0.0 oz/week    0 Standard drinks or equivalent per week     Comment: maybe 2 a month    Current Outpatient Prescriptions  Medication Sig Dispense Refill  . Acetylcarnitine HCl (ACETYL L-CARNITINE) 250 MG CAPS Take by mouth. 200 mg every other day    . albuterol (PROVENTIL HFA;VENTOLIN HFA) 108 (90 BASE) MCG/ACT inhaler Inhale 2 puffs into the lungs every 6 (six) hours as needed for wheezing or shortness of breath. 1 Inhaler 0  . ALPRAZolam (XANAX) 0.25 MG tablet take 1 tablet by mouth at bedtime if needed 30 tablet 2  . atorvastatin (LIPITOR) 80 MG tablet take 1 tablet by mouth once daily 90 tablet 1  . b complex vitamins tablet Take 1 tablet by mouth daily.      . Biotin 300 MCG TABS Take 600 mcg by mouth every other day.      . Calcium-Magnesium-Zinc 500-250-12.5 MG TABS Take 1 tablet by mouth every other day.      . Chromium Picolinate 200 MCG TABS Take 1 tablet by mouth every other day.      . clopidogrel (PLAVIX) 75 MG tablet  take 1 tablet by mouth once daily 90 tablet 1  . Coenzyme Q10 (COQ10) 200 MG CAPS Take 1 capsule by mouth daily.      Marland Kitchen CRANBERRY JUICE EXTRACT PO Take 500 mg by mouth daily.      . Flaxseed, Linseed, (FLAX SEED OIL) 1000 MG CAPS Take 1,200 mg by mouth daily.      . Garlic Oil 725 MG TABS Take 1 tablet by mouth daily.      Nyoka Cowden Tea, Camillia sinensis, 315 MG CAPS Take 1 capsule by mouth daily.      . metoprolol tartrate (LOPRESSOR) 25 MG tablet Take 0.5 tablets (12.5 mg total) by mouth 2 (two) times daily. 90 tablet 0  . nitroGLYCERIN (NITROSTAT) 0.4 MG SL tablet Place 1 tablet (0.4 mg total) under the tongue every 5 (five) minutes as needed. 25 tablet 6  . Omega-3 Fatty Acids (FISH OIL) 1200 MG CAPS Take 1 capsule by mouth daily.       . Rhodiola 300 MG CAPS Take 1 capsule by mouth daily.       No current facility-administered medications for this visit.   Allergies  Allergen Reactions  . Chlorpromazine Hcl   . Codeine     REACTION: causes vomiting  . Hydrocodone     REACTION: causes vomiing and nausea  . Hydrocodone-Acetaminophen   . Penicillins     REACTION: causes rash  . Prochlorperazine Edisylate   . Promethazine Hcl   . Sulfonamide Derivatives      Review of Systems: All systems reviewed and negative except where noted in HPI.   Labs reviewed in Epic - CBC and LFTs normal  Physical Exam: BP 106/68 mmHg  Pulse 72  Ht 5' 1.61" (1.565 m)  Wt 164 lb 8 oz (74.617 kg)  BMI 30.47 kg/m2 Constitutional: Pleasant,well-developed, female in no acute distress. HEENT: Normocephalic and atraumatic. Conjunctivae are normal. No scleral icterus. Neck supple.  Cardiovascular: Normal rate, regular rhythm.  Pulmonary/chest: Effort normal and breath sounds normal. No wheezing, rales or rhonchi. Abdominal: Soft, nondistended, nontender. Bowel sounds active throughout. There are no masses palpable. No hepatomegaly. Extremities: no edema Lymphadenopathy: No cervical adenopathy noted. Neurological: Alert and oriented to person place and time. Skin: Skin is warm and dry. No rashes noted. Psychiatric: Normal mood and affect. Behavior is normal.   ASSESSMENT AND PLAN: 64 y/o female who had a colonoscopy without adenomas in 2007, here for follow up for colon cancer screening. She is on plavix for history of CAD. I discussed screening modalities with her to include optical colonoscopy, virtual colonoscopy, and stool studies. Risks / benefits of each. I discussed that if she has optical colonoscopy she would need to hold plavix 5-7 days prior to the procedure, and has risks of stent thrombosis / MI. Following discussion of risks / benefits of each screening modality her preference was to proceed with optical colonoscopy. We  will reach out to her cardiologist for clearance to hold plavix prior to the procedure. This exam will also evaluate her symptoms of rare fecal leakage and ensure no anorectal pathology contributing.  The patient also has a history of what appears to be untreated reflux and asked about screening EGD for Barrett's. I discussed indications for screening. While females are lower risk for males for esophageal cancer and screening in females is not routinely performed, per the ACG guidelines, given her extensive tobacco use, longstanding symptoms, age, and weight, she has multiple risk factors and I offered her screening EGD.  Following discussion of EGD she wished to proceed. Otherwise, discussed risks / benefits PPI and H2-blockers to her reflux currently. Following our discussion she wished to have a trial of Zantac 150mg  q day to BID. If this does not control symptoms, she will try PPI and let me know.   The indications, risks, and benefits of EGD and colonoscopy were explained to the patient in detail. Risks include but are not limited to bleeding, perforation, adverse reaction to medications, and cardiopulmonary compromise. Sequelae include but are not limited to the possibility of surgery, hositalization, and mortality. The patient verbalized understanding and wished to proceed. All questions answered, referred to scheduler and bowel prep ordered. Further recommendations pending results of the exam.   Richards Cellar, MD [facility]

## 2015-03-23 NOTE — Patient Instructions (Signed)
We have sent the following medications to your pharmacy for you to pick up at your convenience:  Zantac 

## 2015-03-29 ENCOUNTER — Other Ambulatory Visit: Payer: Self-pay | Admitting: Cardiology

## 2015-04-19 LAB — HM PAP SMEAR: HM Pap smear: NORMAL

## 2015-06-15 ENCOUNTER — Telehealth: Payer: Self-pay | Admitting: Gastroenterology

## 2015-06-16 ENCOUNTER — Telehealth: Payer: Self-pay

## 2015-06-16 NOTE — Telephone Encounter (Signed)
May hold Plavix 5 days prior to procedure.

## 2015-06-16 NOTE — Telephone Encounter (Signed)
  06/16/2015   RE: Emily [name] DOB: [birth year] MRN: [id number]   Dear Dr. Aundra Dubin,    We have scheduled the above patient for an endoscopic procedure. Our records show that she is on anticoagulation therapy.   Please advise as to how long the patient may come off her therapy of Plavix prior to the procedure.  Please fax back/ or route the completed form to Legrand Lasser at [phone].   Sincerely,    Margreta Journey

## 2015-06-16 NOTE — Telephone Encounter (Signed)
Sent anticoag letter to pt's cardiologist. Awaiting a response.

## 2015-06-17 ENCOUNTER — Encounter: Payer: Self-pay | Admitting: Gastroenterology

## 2015-06-17 NOTE — Telephone Encounter (Signed)
Appointment scheduled.

## 2015-06-17 NOTE — Telephone Encounter (Signed)
Pt is ok to schedule now.

## 2015-06-22 ENCOUNTER — Other Ambulatory Visit: Payer: Self-pay | Admitting: Cardiology

## 2015-07-01 ENCOUNTER — Telehealth: Payer: Self-pay | Admitting: Pharmacist

## 2015-07-01 NOTE — Telephone Encounter (Signed)
NEw Message  Pt calling to speak w/ Gay Filler- had appt 1/17 (now canc) and did not think it was necessary. Pt requested to speak w/ Gay Filler to confirm/ Please call back and discuss.

## 2015-07-02 NOTE — Telephone Encounter (Signed)
Spoke with pt.  She has not made any changes since the last visit so she did not think there was any need to check her labs.  I agree.  She has a recall for follow up with Dr. Aundra Dubin in May and will recheck at that time.

## 2015-07-05 ENCOUNTER — Other Ambulatory Visit: Payer: BLUE CROSS/BLUE SHIELD

## 2015-07-06 ENCOUNTER — Ambulatory Visit: Payer: BLUE CROSS/BLUE SHIELD | Admitting: Pharmacist

## 2015-07-16 ENCOUNTER — Telehealth: Payer: Self-pay | Admitting: Physician Assistant

## 2015-07-16 ENCOUNTER — Ambulatory Visit (AMBULATORY_SURGERY_CENTER): Payer: Self-pay

## 2015-07-16 VITALS — Ht 61.5 in | Wt 167.2 lb

## 2015-07-16 DIAGNOSIS — Z860100 Personal history of colon polyps, unspecified: Secondary | ICD-10-CM

## 2015-07-16 MED ORDER — SUPREP BOWEL PREP KIT 17.5-3.13-1.6 GM/177ML PO SOLN: 1.0000 | Freq: Once | ORAL | Status: DC

## 2015-07-16 NOTE — Progress Notes (Signed)
No allergies to eggs or soy No past problems with anesthesia No home oxygen No diet/weight loss meds  Has email and internet; registered for emmi 

## 2015-07-16 NOTE — Telephone Encounter (Signed)
LM to schedule CPE ( last unknown) & update flu shot info

## 2015-07-21 NOTE — Telephone Encounter (Signed)
Pt declines flu shot.  

## 2015-07-28 ENCOUNTER — Telehealth: Payer: Self-pay | Admitting: Gastroenterology

## 2015-07-29 NOTE — Telephone Encounter (Signed)
Pt called and I informed her that a prep was at the front for her. She understands.

## 2015-07-29 NOTE — Telephone Encounter (Signed)
Called pt to inform her that a prep was put up at the front for her to pick up. Left vm for pt to call back.

## 2015-07-30 ENCOUNTER — Telehealth: Payer: Self-pay | Admitting: Gastroenterology

## 2015-07-30 ENCOUNTER — Ambulatory Visit (AMBULATORY_SURGERY_CENTER): Payer: BLUE CROSS/BLUE SHIELD | Admitting: Gastroenterology

## 2015-07-30 ENCOUNTER — Encounter: Payer: BLUE CROSS/BLUE SHIELD | Admitting: Gastroenterology

## 2015-07-30 ENCOUNTER — Encounter: Payer: Self-pay | Admitting: Gastroenterology

## 2015-07-30 VITALS — BP 129/73 | HR 71 | Temp 97.7°F | Resp 16 | Ht 61.5 in | Wt 167.0 lb

## 2015-07-30 DIAGNOSIS — Z86010 Personal history of colonic polyps: Secondary | ICD-10-CM

## 2015-07-30 DIAGNOSIS — K219 Gastro-esophageal reflux disease without esophagitis: Secondary | ICD-10-CM | POA: Diagnosis not present

## 2015-07-30 DIAGNOSIS — D122 Benign neoplasm of ascending colon: Secondary | ICD-10-CM

## 2015-07-30 DIAGNOSIS — K635 Polyp of colon: Secondary | ICD-10-CM | POA: Diagnosis not present

## 2015-07-30 DIAGNOSIS — D123 Benign neoplasm of transverse colon: Secondary | ICD-10-CM

## 2015-07-30 DIAGNOSIS — D124 Benign neoplasm of descending colon: Secondary | ICD-10-CM

## 2015-07-30 MED ORDER — OMEPRAZOLE 40 MG PO CPDR
40.0000 mg | DELAYED_RELEASE_CAPSULE | Freq: Every day | ORAL | Status: DC
Start: 2015-07-30 — End: 2015-08-04

## 2015-07-30 MED ORDER — SODIUM CHLORIDE 0.9 % IV SOLN: 500.0000 mL | INTRAVENOUS | Status: DC

## 2015-07-30 NOTE — Progress Notes (Signed)
To recovery, reportr to Tyrell, RN, VSS

## 2015-07-30 NOTE — Patient Instructions (Signed)
NO NSAIDS (MOTRIN, ADVIL, ALEVE, IBUPROFEN, ETC.) for two weeks, August 12, 2014. Resume your Plavix in 3 days, Monday, July 25, 2014.  Handouts for GERD and Polyps.    YOU HAD AN ENDOSCOPIC PROCEDURE TODAY AT [facility]:   Refer to the procedure report that was given to you for any specific questions about what was found during the examination.  If the procedure report does not answer your questions, please call your gastroenterologist to clarify.  If you requested that your care partner not be given the details of your procedure findings, then the procedure report has been included in a sealed envelope for you to review at your convenience later.  YOU SHOULD EXPECT: Some feelings of bloating in the abdomen. Passage of more gas than usual.  Walking can help get rid of the air that was put into your GI tract during the procedure and reduce the bloating. If you had a lower endoscopy (such as a colonoscopy or flexible sigmoidoscopy) you may notice spotting of blood in your stool or on the toilet paper. If you underwent a bowel prep for your procedure, you may not have a normal bowel movement for a few days.  Please Note:  You might notice some irritation and congestion in your nose or some drainage.  This is from the oxygen used during your procedure.  There is no need for concern and it should clear up in a day or so.  SYMPTOMS TO REPORT IMMEDIATELY:   Following lower endoscopy (colonoscopy or flexible sigmoidoscopy):  Excessive amounts of blood in the stool  Significant tenderness or worsening of abdominal pains  Swelling of the abdomen that is new, acute  Fever of 100F or higher   Following upper endoscopy (EGD)  Vomiting of blood or coffee ground material  New chest pain or pain under the shoulder blades  Painful or persistently difficult swallowing  New shortness of breath  Fever of 100F or higher  Black, tarry-looking stools  For urgent or emergent issues,  a gastroenterologist can be reached at any hour by calling [phone].   DIET: Your first meal following the procedure should be a small meal and then it is ok to progress to your normal diet. Heavy or fried foods are harder to digest and may make you feel nauseous or bloated.  Likewise, meals heavy in dairy and vegetables can increase bloating.  Drink plenty of fluids but you should avoid alcoholic beverages for 24 hours.  ACTIVITY:  You should plan to take it easy for the rest of today and you should NOT DRIVE or use heavy machinery until tomorrow (because of the sedation medicines used during the test).    FOLLOW UP: Our staff will call the number listed on your records the next business day following your procedure to check on you and address any questions or concerns that you may have regarding the information given to you following your procedure. If we do not reach you, we will leave a message.  However, if you are feeling well and you are not experiencing any problems, there is no need to return our call.  We will assume that you have returned to your regular daily activities without incident.  If any biopsies were taken you will be contacted by phone or by letter within the next 1-3 weeks.  Please call us at [phone] if you have not heard about the biopsies in 3 weeks.    SIGNATURES/CONFIDENTIALITY: You and/or your care partner  have signed paperwork which will be entered into your electronic medical record.  These signatures attest to the fact that that the information above on your After Visit Summary has been reviewed and is understood.  Full responsibility of the confidentiality of this discharge information lies with you and/or your care-partner.

## 2015-07-30 NOTE — Op Note (Signed)
[facility]  [address] [place], [id number]   ENDOSCOPY PROCEDURE REPORT  PATIENT: Emily [name], Emily [name]  MR#: [id number] BIRTHDATE: 06-Jan-1951 , 64  yrs. old GENDER: female ENDOSCOPIST: Yetta Flock, MD REFERRED BY: PROCEDURE DATE:  07/30/2015 PROCEDURE:  EGD w/ biopsy ASA CLASS:     Class III INDICATIONS:  heartburn and screening for Barrett's. MEDICATIONS: Propofol 200 mg IV TOPICAL ANESTHETIC:  DESCRIPTION OF PROCEDURE: After the risks benefits and alternatives of the procedure were thoroughly explained, informed consent was obtained.  The LB [id number] endoscope was introduced through the mouth and advanced to the second portion of the duodenum , Without limitations.  The instrument was slowly withdrawn as the mucosa was fully examined.   FINDINGS:There was LA grade B esophagitis at the distal esophagus / GEJ.  Biopsies were taken of the GEJ to ensure no evidence of Barrett's.  Multiple flat polypoid lesions grossly consistent with papillomas were noted throughout the mid and distal esophagus. Biopsies were taken to confirm the finding and ensure no dysplastic changes.  DH noted 35cm from the incisors, with GEJ and SCJ located 32cm from the incisors, with a 3cm hiatal hernia. The stomach was normal in appearance.  The duodenal bulb and 2nd portion of the duodenum were normal.  Retroflexed views revealed a hiatal hernia. The scope was then withdrawn from the patient and the procedure completed.  COMPLICATIONS: There were no immediate complications.  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

## 2015-07-30 NOTE — Telephone Encounter (Signed)
Returned patient's call and she was able to get the prep down yesterday.  She was able to get 2/3 of the prep down this morning but is feeling very nauseated.  She states that her stools are watery and clear.  I advised her that it was fine not to complete the prep as long as her stools are clear and reassured her not drink anything after 11 am.  She will be here at her appointed time for her colonoscopy.  All questions were answered.

## 2015-07-30 NOTE — Op Note (Signed)
[place]  [address] [place], [id number]   COLONOSCOPY PROCEDURE REPORT  PATIENT: Emily [name], Emily [name]  MR#: [id number] BIRTHDATE: 05-Jul-1950 , 64  yrs. old GENDER: female ENDOSCOPIST: Yetta Flock, MD REFERRED BY: Raiford Noble PA PROCEDURE DATE:  07/30/2015 PROCEDURE:   Colonoscopy, screening, Colonoscopy with snare polypectomy, and Colonoscopy with biopsy First Screening Colonoscopy - Avg.  risk and is 50 yrs.  old or older - No.  Prior Negative Screening - Now for repeat screening. 10 or more years since last screening  History of Adenoma - Now for follow-up colonoscopy & has been > or = to 3 yrs.  N/A  Polyps removed today? Yes ASA CLASS:   Class III INDICATIONS:Screening for colonic neoplasia and Colorectal Neoplasm Risk Assessment for this procedure is average risk. MEDICATIONS: Propofol 180 mg IV  DESCRIPTION OF PROCEDURE:   After the risks benefits and alternatives of the procedure were thoroughly explained, informed consent was obtained.  The digital rectal exam revealed no abnormalities of the rectum.   The LB [id number]  endoscope was introduced through the anus and advanced to the cecum, which was identified by both the appendix and ileocecal valve. No adverse events experienced.   The quality of the prep was adequate  The instrument was then slowly withdrawn as the colon was fully examined. Estimated blood loss is zero unless otherwise noted in this procedure report.   COLON FINDINGS: A diminutive sessile polyp was noted in the ascending colon and removed with cold forceps.  A 5-82mm sessile polyp was noted in the ascending colon and removed with cold snare. A 77mm flat polyp was noted in the transverse colon and removed with cold snare.  A 21mm sessile polyp was noted in the transverse colon and removed with cold forceps.  A 46mm sessile polyp was noted in the descending colon and removed with cold snare.  A 53mm sessile polyp was  noted in the descending colon and removed with cold snare.  Internal hemorrhoids were appreciated. The remainder of the colon was normal.  Retroflexion was not performed due to a narrow rectal vault. The time to cecum = 1.6 Withdrawal time = 14.4   The scope was withdrawn and the procedure completed. COMPLICATIONS: There were no immediate complications.    ENDOSCOPIC IMPRESSION: 6 small polyps removed via cold technique as outlined above Internal hemorrhoids  RECOMMENDATIONS: Await pathology results Resume plavix in 3 days Resume diet No NSAIDS for 2 weeks, use tylenol if pain medication needed  eSigned:  Yetta Flock, MD 07/30/2015 2:49 PM   cc:  Raiford Noble PA, the patient   PATIENT NAME:  Emily [name], Emily [name] MR#: [id number]

## 2015-07-30 NOTE — Progress Notes (Signed)
Called to room to assist during endoscopic procedure.  Patient ID and intended procedure confirmed with present staff. Received instructions for my participation in the procedure from the performing physician.  

## 2015-08-02 ENCOUNTER — Telehealth: Payer: Self-pay

## 2015-08-02 ENCOUNTER — Telehealth: Payer: Self-pay | Admitting: Gastroenterology

## 2015-08-02 DIAGNOSIS — K219 Gastro-esophageal reflux disease without esophagitis: Secondary | ICD-10-CM

## 2015-08-02 NOTE — Telephone Encounter (Signed)
  Follow up Call-  Call back number 07/30/2015  Post procedure Call Back phone  # [phone]  Permission to leave phone message Yes     Patient was called for follow up after procedure on 07/30/2015. No answer at the number given for follow up. A message was left on the answering machine.

## 2015-08-03 NOTE — Telephone Encounter (Signed)
Left vm for pt to callback 

## 2015-08-04 MED ORDER — OMEPRAZOLE 40 MG PO CPDR: 40.0000 mg | DELAYED_RELEASE_CAPSULE | Freq: Every day | ORAL | Status: DC

## 2015-08-04 NOTE — Telephone Encounter (Signed)
Patient states that the pharmacist said that omeprazole will interfere with the plavix she is taking. She wants you to clarify if this is true. She also wants to know how long she should take omeprazole because it causes bone damage.

## 2015-08-04 NOTE — Telephone Encounter (Signed)
Patient returning phone call. Best # [phone]. Anytime after 2:30pm

## 2015-08-04 NOTE — Telephone Encounter (Signed)
I called the patient and discussed that previously there was a concern about the interaction of plavix and PPIs causing decreased efficacy of the plavix. I reassured her that more recent data is now available and per the ACG guidelines there is no significant increased risk of heart attack in patient's on plavix and PPI, and in fact they have less risk of GI bleeding. I had intended to treat her for a few weeks on omeprazole to treat her esophagitis. She reports while I had prescribed zantac in October she just started taking it a few weeks ago, so difficult to say if the esophagitis truly broke through the zantac. We are awaiting the biopsies but reassured her that omeprazole is safe to take for a few weeks. I discussed the slightly increased risk of hip fracture on PPI with long term use, I would not think a 2-3 week course would significant increase her risk for fracture. We are treating esophagitis noted endoscopically and think the benefits would outweigh these risks for short term use. She agreed.

## 2015-08-10 ENCOUNTER — Telehealth: Payer: Self-pay | Admitting: Gastroenterology

## 2015-08-10 NOTE — Telephone Encounter (Signed)
Left a message for patient to call back. 

## 2015-08-11 ENCOUNTER — Ambulatory Visit (INDEPENDENT_AMBULATORY_CARE_PROVIDER_SITE_OTHER): Payer: BLUE CROSS/BLUE SHIELD | Admitting: Physician Assistant

## 2015-08-11 ENCOUNTER — Encounter: Payer: Self-pay | Admitting: Physician Assistant

## 2015-08-11 VITALS — BP 128/84 | HR 90 | Temp 97.9°F | Ht 61.5 in | Wt 167.0 lb

## 2015-08-11 DIAGNOSIS — E669 Obesity, unspecified: Secondary | ICD-10-CM

## 2015-08-11 DIAGNOSIS — Z0000 Encounter for general adult medical examination without abnormal findings: Secondary | ICD-10-CM | POA: Diagnosis not present

## 2015-08-11 DIAGNOSIS — E785 Hyperlipidemia, unspecified: Secondary | ICD-10-CM

## 2015-08-11 DIAGNOSIS — I10 Essential (primary) hypertension: Secondary | ICD-10-CM

## 2015-08-11 NOTE — Assessment & Plan Note (Signed)
Will check fasting lipid panel and CMP today.

## 2015-08-11 NOTE — Assessment & Plan Note (Signed)
Depression screen negative. Health Maintenance reviewed -Mammogram, PAP and colonoscopy up-to-date. Will get record of mammogram results faxed over from [facility] Preventive schedule discussed and handout given in AVS. Will obtain fasting labs today.

## 2015-08-11 NOTE — Assessment & Plan Note (Signed)
Controlled. Continue current regimen. Follow-up with Cardiology as scheduled.

## 2015-08-11 NOTE — Progress Notes (Signed)
Patient presents to clinic today for annual exam.  Patient is fasting for labs.  Chronic Issues: Hypertension -- Endorses taking metoprolol 12.5 mg BID. Is also on Plavix. Followed by Cardiology for CAD. Patient denies chest pain, palpitations, lightheadedness, dizziness, vision changes or frequent headaches.   BP Readings from Last 3 Encounters:  08/11/15 128/84  07/30/15 129/73  03/23/15 106/68   Hyperlipidemia -- Is tolerating stating without myalgias.   Health Maintenance: Immunizations -- Tetanus up-to-date. Declines flu shot. Colonoscopy -- up-to-date Mammogram -- Last Mammogram 2016. Followed by Dr. Katharine Look Rivard (GYN). Will get results faxed here. PAP --  Up-to-date.  Past Medical History  Diagnosis Date  . Chronic insomnia   . Perirectal abscess   . Colon polyp   . Anxiety   . Depression   . Hypertension   . CAD (coronary artery disease)   . Heart attack ([place]) 1-09  . Hyperlipemia     Past Surgical History  Procedure Laterality Date  . Cesarean section      x 2  . Ptca with stent to third obtuse marginal    . Colonoscopy w/ polypectomy  2007  . Incision and drainage abscess anal      Current Outpatient Prescriptions on File Prior to Visit  Medication Sig Dispense Refill  . Acetylcarnitine HCl (ACETYL L-CARNITINE) 250 MG CAPS Take by mouth. 200 mg every other day    . albuterol (PROVENTIL HFA;VENTOLIN HFA) 108 (90 BASE) MCG/ACT inhaler Inhale 2 puffs into the lungs every 6 (six) hours as needed for wheezing or shortness of breath. 1 Inhaler 0  . ALPRAZolam (XANAX) 0.25 MG tablet take 1 tablet by mouth at bedtime if needed 30 tablet 2  . atorvastatin (LIPITOR) 80 MG tablet take 1 tablet by mouth once daily 90 tablet 1  . b complex vitamins tablet Take 1 tablet by mouth daily.      . Biotin 300 MCG TABS Take 600 mcg by mouth every other day.      . Calcium-Magnesium-Zinc 500-250-12.5 MG TABS Take 1 tablet by mouth every other day.      . Chromium  Picolinate 200 MCG TABS Take 1 tablet by mouth every other day.      . clopidogrel (PLAVIX) 75 MG tablet take 1 tablet by mouth once daily 90 tablet 1  . Coenzyme Q10 (COQ10) 200 MG CAPS Take 1 capsule by mouth daily.      Marland Kitchen CRANBERRY JUICE EXTRACT PO Take 500 mg by mouth daily.      . Flaxseed, Linseed, (FLAX SEED OIL) 1000 MG CAPS Take 1,200 mg by mouth daily.      . Garlic Oil XX123456 MG TABS Take 1 tablet by mouth daily.      Nyoka Cowden Tea, Camillia sinensis, 315 MG CAPS Take 1 capsule by mouth daily.      . metoprolol tartrate (LOPRESSOR) 25 MG tablet TAKE 1/2 TABLET BY MOUTH TWICE A DAY 90 tablet 1  . nitroGLYCERIN (NITROSTAT) 0.4 MG SL tablet Place 1 tablet (0.4 mg total) under the tongue every 5 (five) minutes as needed. 25 tablet 6  . Omega-3 Fatty Acids (FISH OIL) 1200 MG CAPS Take 1 capsule by mouth daily.      Marland Kitchen omeprazole (PRILOSEC) 40 MG capsule Take 1 capsule (40 mg total) by mouth daily. 21 capsule 0  . ranitidine (ZANTAC) 150 MG tablet Take 1 tablet (150 mg total) by mouth 2 (two) times daily as needed for heartburn. 90 tablet 3  .  Rhodiola 300 MG CAPS Take 1 capsule by mouth daily.       No current facility-administered medications on file prior to visit.    Allergies  Allergen Reactions  . Chlorpromazine Hcl   . Codeine     REACTION: causes vomiting  . Hydrocodone     REACTION: causes vomiing and nausea  . Hydrocodone-Acetaminophen   . Penicillins     REACTION: causes rash  . Prochlorperazine Edisylate   . Promethazine Hcl   . Sulfonamide Derivatives     Family History  Problem Relation Age of Onset  . Skin cancer Mother   . Allergy (severe) Father   . Renal Disease Paternal Grandfather     one removed   . Colon cancer Neg Hx     Social History   Social History  . Marital Status: Married    Spouse Name: N/A  . Number of Children: 2  . Years of Education: N/A   Occupational History  . housewife/ student    Social History Main Topics  . Smoking status:  Former Smoker -- 2.00 packs/day for 20 years    Types: Cigarettes    Quit date: 06/19/2006  . Smokeless tobacco: Never Used  . Alcohol Use: 0.0 oz/week    0 Standard drinks or equivalent per week     Comment: maybe 2 a month   . Drug Use: No  . Sexual Activity: Not on file   Other Topics Concern  . Not on file   Social History Narrative   Resides in [place] with her husband.    Husband works in [place] and comes home two to three times a year   2 children who go to [place]   Just got accepted to [employer]   Exercise- NO   Review of Systems  Constitutional: Negative for fever and weight loss.  HENT: Negative for ear discharge, ear pain, hearing loss and tinnitus.   Eyes: Negative for blurred vision, double vision, photophobia and pain.  Respiratory: Negative for cough and shortness of breath.   Cardiovascular: Negative for chest pain and palpitations.  Gastrointestinal: Negative for heartburn, nausea, vomiting, abdominal pain, diarrhea, constipation, blood in stool and melena.  Genitourinary: Negative for dysuria, urgency, frequency, hematuria and flank pain.  Musculoskeletal: Negative for falls.  Neurological: Negative for dizziness, loss of consciousness and headaches.  Endo/Heme/Allergies: Negative for environmental allergies.  Psychiatric/Behavioral: Negative for depression, suicidal ideas, hallucinations and substance abuse. The patient is not nervous/anxious and does not have insomnia.    BP 128/84 mmHg  Pulse 90  Temp(Src) 97.9 F (36.6 C) (Oral)  Ht 5' 1.5" (1.562 m)  Wt 167 lb (75.751 kg)  BMI 31.05 kg/m2  SpO2 98%  Physical Exam  Constitutional: She is oriented to person, place, and time and well-developed, well-nourished, and in no distress.  HENT:  Head: Normocephalic and atraumatic.  Right Ear: Tympanic membrane, external ear and ear canal normal.  Left Ear: Tympanic membrane, external ear and ear canal normal.  Nose: Nose normal. No mucosal edema.    Mouth/Throat: Uvula is midline, oropharynx is clear and moist and mucous membranes are normal. No oropharyngeal exudate or posterior oropharyngeal erythema.  Eyes: Conjunctivae are normal. Pupils are equal, round, and reactive to light.  Neck: Neck supple. No thyromegaly present.  Cardiovascular: Normal rate, regular rhythm, normal heart sounds and intact distal pulses.   Pulmonary/Chest: Effort normal and breath sounds normal. No respiratory distress. She has no wheezes. She has no rales.  Abdominal: Soft. Bowel  sounds are normal. She exhibits no distension and no mass. There is no tenderness. There is no rebound and no guarding.  Lymphadenopathy:    She has no cervical adenopathy.  Neurological: She is alert and oriented to person, place, and time. No cranial nerve deficit.  Skin: Skin is warm and dry. No rash noted.  Psychiatric: Affect normal.  Vitals reviewed.   No results found for this or any previous visit (from the past 2160 hour(s)).  Assessment/Plan: Visit for preventive health examination Depression screen negative. Health Maintenance reviewed -Mammogram, PAP and colonoscopy up-to-date. Will get record of mammogram results faxed over from [facility] Preventive schedule discussed and handout given in AVS. Will obtain fasting labs today.   Obesity Body mass index is 31.05 kg/(m^2). Diet and exercise measures reviewed  Hyperlipidemia Will check fasting lipid panel and CMP today.  HYPERTENSION, MILD Controlled. Continue current regimen. Follow-up with Cardiology as scheduled.

## 2015-08-11 NOTE — Patient Instructions (Signed)
Please go to the lab for blood work.  I will call you with your results. If your blood work is normal we will follow-up yearly for physicals.  Continue medications as directed.  We are sending a medial record request to your GYN so we can get the results of your last mammogram.  Preventive Care for Adults, Female A healthy lifestyle and preventive care can promote health and wellness. Preventive health guidelines for women include the following key practices.  A routine yearly physical is a good way to check with your health care provider about your health and preventive screening. It is a chance to share any concerns and updates on your health and to receive a thorough exam.  Visit your dentist for a routine exam and preventive care every 6 months. Brush your teeth twice a day and floss once a day. Good oral hygiene prevents tooth decay and gum disease.  The frequency of eye exams is based on your age, health, family medical history, use of contact lenses, and other factors. Follow your health care provider's recommendations for frequency of eye exams.  Eat a healthy diet. Foods like vegetables, fruits, whole grains, low-fat dairy products, and lean protein foods contain the nutrients you need without too many calories. Decrease your intake of foods high in solid fats, added sugars, and salt. Eat the right amount of calories for you.Get information about a proper diet from your health care provider, if necessary.  Regular physical exercise is one of the most important things you can do for your health. Most adults should get at least 150 minutes of moderate-intensity exercise (any activity that increases your heart rate and causes you to sweat) each week. In addition, most adults need muscle-strengthening exercises on 2 or more days a week.  Maintain a healthy weight. The body mass index (BMI) is a screening tool to identify possible weight problems. It provides an estimate of body fat based on  height and weight. Your health care provider can find your BMI and can help you achieve or maintain a healthy weight.For adults 20 years and older:  A BMI below 18.5 is considered underweight.  A BMI of 18.5 to 24.9 is normal.  A BMI of 25 to 29.9 is considered overweight.  A BMI of 30 and above is considered obese.  Maintain normal blood lipids and cholesterol levels by exercising and minimizing your intake of saturated fat. Eat a balanced diet with plenty of fruit and vegetables. Blood tests for lipids and cholesterol should begin at age 24 and be repeated every 5 years. If your lipid or cholesterol levels are high, you are over 50, or you are at high risk for heart disease, you may need your cholesterol levels checked more frequently.Ongoing high lipid and cholesterol levels should be treated with medicines if diet and exercise are not working.  If you smoke, find out from your health care provider how to quit. If you do not use tobacco, do not start.  Lung cancer screening is recommended for adults aged 37-80 years who are at high risk for developing lung cancer because of a history of smoking. A yearly low-dose CT scan of the lungs is recommended for people who have at least a 30-pack-year history of smoking and are a current smoker or have quit within the past 15 years. A pack year of smoking is smoking an average of 1 pack of cigarettes a day for 1 year (for example: 1 pack a day for 30 years or  2 packs a day for 15 years). Yearly screening should continue until the smoker has stopped smoking for at least 15 years. Yearly screening should be stopped for people who develop a health problem that would prevent them from having lung cancer treatment.  If you are pregnant, do not drink alcohol. If you are breastfeeding, be very cautious about drinking alcohol. If you are not pregnant and choose to drink alcohol, do not have more than 1 drink per day. One drink is considered to be 12 ounces (355  mL) of beer, 5 ounces (148 mL) of wine, or 1.5 ounces (44 mL) of liquor.  Avoid use of street drugs. Do not share needles with anyone. Ask for help if you need support or instructions about stopping the use of drugs.  High blood pressure causes heart disease and increases the risk of stroke. Your blood pressure should be checked at least every 1 to 2 years. Ongoing high blood pressure should be treated with medicines if weight loss and exercise do not work.  If you are 66-62 years old, ask your health care provider if you should take aspirin to prevent strokes.  Diabetes screening is done by taking a blood sample to check your blood glucose level after you have not eaten for a certain period of time (fasting). If you are not overweight and you do not have risk factors for diabetes, you should be screened once every 3 years starting at age 87. If you are overweight or obese and you are 7-35 years of age, you should be screened for diabetes every year as part of your cardiovascular risk assessment.  Breast cancer screening is essential preventive care for women. You should practice "breast self-awareness." This means understanding the normal appearance and feel of your breasts and may include breast self-examination. Any changes detected, no matter how small, should be reported to a health care provider. Women in their 20s and 30s should have a clinical breast exam (CBE) by a health care provider as part of a regular health exam every 1 to 3 years. After age 55, women should have a CBE every year. Starting at age 32, women should consider having a mammogram (breast X-ray test) every year. Women who have a family history of breast cancer should talk to their health care provider about genetic screening. Women at a high risk of breast cancer should talk to their health care providers about having an MRI and a mammogram every year.  Breast cancer gene (BRCA)-related cancer risk assessment is recommended for  women who have family members with BRCA-related cancers. BRCA-related cancers include breast, ovarian, tubal, and peritoneal cancers. Having family members with these cancers may be associated with an increased risk for harmful changes (mutations) in the breast cancer genes BRCA1 and BRCA2. Results of the assessment will determine the need for genetic counseling and BRCA1 and BRCA2 testing.  Your health care provider may recommend that you be screened regularly for cancer of the pelvic organs (ovaries, uterus, and vagina). This screening involves a pelvic examination, including checking for microscopic changes to the surface of your cervix (Pap test). You may be encouraged to have this screening done every 3 years, beginning at age 39.  For women ages 107-65, health care providers may recommend pelvic exams and Pap testing every 3 years, or they may recommend the Pap and pelvic exam, combined with testing for human papilloma virus (HPV), every 5 years. Some types of HPV increase your risk of cervical cancer. Testing for  HPV may also be done on women of any age with unclear Pap test results.  Other health care providers may not recommend any screening for nonpregnant women who are considered low risk for pelvic cancer and who do not have symptoms. Ask your health care provider if a screening pelvic exam is right for you.  If you have had past treatment for cervical cancer or a condition that could lead to cancer, you need Pap tests and screening for cancer for at least 20 years after your treatment. If Pap tests have been discontinued, your risk factors (such as having a new sexual partner) need to be reassessed to determine if screening should resume. Some women have medical problems that increase the chance of getting cervical cancer. In these cases, your health care provider may recommend more frequent screening and Pap tests.  Colorectal cancer can be detected and often prevented. Most routine colorectal  cancer screening begins at the age of 58 years and continues through age 22 years. However, your health care provider may recommend screening at an earlier age if you have risk factors for colon cancer. On a yearly basis, your health care provider may provide home test kits to check for hidden blood in the stool. Use of a small camera at the end of a tube, to directly examine the colon (sigmoidoscopy or colonoscopy), can detect the earliest forms of colorectal cancer. Talk to your health care provider about this at age 66, when routine screening begins. Direct exam of the colon should be repeated every 5-10 years through age 27 years, unless early forms of precancerous polyps or small growths are found.  People who are at an increased risk for hepatitis B should be screened for this virus. You are considered at high risk for hepatitis B if:  You were born in a country where hepatitis B occurs often. Talk with your health care provider about which countries are considered high risk.  Your parents were born in a high-risk country and you have not received a shot to protect against hepatitis B (hepatitis B vaccine).  You have HIV or AIDS.  You use needles to inject street drugs.  You live with, or have sex with, someone who has hepatitis B.  You get hemodialysis treatment.  You take certain medicines for conditions like cancer, organ transplantation, and autoimmune conditions.  Hepatitis C blood testing is recommended for all people born from [birth year] through 1965 and any individual with known risks for hepatitis C.  Practice safe sex. Use condoms and avoid high-risk sexual practices to reduce the spread of sexually transmitted infections (STIs). STIs include gonorrhea, chlamydia, syphilis, trichomonas, herpes, HPV, and human immunodeficiency virus (HIV). Herpes, HIV, and HPV are viral illnesses that have no cure. They can result in disability, cancer, and death.  You should be screened for sexually  transmitted illnesses (STIs) including gonorrhea and chlamydia if:  You are sexually active and are younger than 24 years.  You are older than 24 years and your health care provider tells you that you are at risk for this type of infection.  Your sexual activity has changed since you were last screened and you are at an increased risk for chlamydia or gonorrhea. Ask your health care provider if you are at risk.  If you are at risk of being infected with HIV, it is recommended that you take a prescription medicine daily to prevent HIV infection. This is called preexposure prophylaxis (PrEP). You are considered at risk if:  You are sexually active and do not regularly use condoms or know the HIV status of your partner(s).  You take drugs by injection.  You are sexually active with a partner who has HIV.  Talk with your health care provider about whether you are at high risk of being infected with HIV. If you choose to begin PrEP, you should first be tested for HIV. You should then be tested every 3 months for as long as you are taking PrEP.  Osteoporosis is a disease in which the bones lose minerals and strength with aging. This can result in serious bone fractures or breaks. The risk of osteoporosis can be identified using a bone density scan. Women ages 64 years and over and women at risk for fractures or osteoporosis should discuss screening with their health care providers. Ask your health care provider whether you should take a calcium supplement or vitamin D to reduce the rate of osteoporosis.  Menopause can be associated with physical symptoms and risks. Hormone replacement therapy is available to decrease symptoms and risks. You should talk to your health care provider about whether hormone replacement therapy is right for you.  Use sunscreen. Apply sunscreen liberally and repeatedly throughout the day. You should seek shade when your shadow is shorter than you. Protect yourself by  wearing long sleeves, pants, a wide-brimmed hat, and sunglasses year round, whenever you are outdoors.  Once a month, do a whole body skin exam, using a mirror to look at the skin on your back. Tell your health care provider of new moles, moles that have irregular borders, moles that are larger than a pencil eraser, or moles that have changed in shape or color.  Stay current with required vaccines (immunizations).  Influenza vaccine. All adults should be immunized every year.  Tetanus, diphtheria, and acellular pertussis (Td, Tdap) vaccine. Pregnant women should receive 1 dose of Tdap vaccine during each pregnancy. The dose should be obtained regardless of the length of time since the last dose. Immunization is preferred during the 27th-36th week of gestation. An adult who has not previously received Tdap or who does not know her vaccine status should receive 1 dose of Tdap. This initial dose should be followed by tetanus and diphtheria toxoids (Td) booster doses every 10 years. Adults with an unknown or incomplete history of completing a 3-dose immunization series with Td-containing vaccines should begin or complete a primary immunization series including a Tdap dose. Adults should receive a Td booster every 10 years.  Varicella vaccine. An adult without evidence of immunity to varicella should receive 2 doses or a second dose if she has previously received 1 dose. Pregnant females who do not have evidence of immunity should receive the first dose after pregnancy. This first dose should be obtained before leaving the health care facility. The second dose should be obtained 4-8 weeks after the first dose.  Human papillomavirus (HPV) vaccine. Females aged 13-26 years who have not received the vaccine previously should obtain the 3-dose series. The vaccine is not recommended for use in pregnant females. However, pregnancy testing is not needed before receiving a dose. If a female is found to be pregnant  after receiving a dose, no treatment is needed. In that case, the remaining doses should be delayed until after the pregnancy. Immunization is recommended for any person with an immunocompromised condition through the age of 48 years if she did not get any or all doses earlier. During the 3-dose series, the second dose should  be obtained 4-8 weeks after the first dose. The third dose should be obtained 24 weeks after the first dose and 16 weeks after the second dose.  Zoster vaccine. One dose is recommended for adults aged 18 years or older unless certain conditions are present.  Measles, mumps, and rubella (MMR) vaccine. Adults born before [birth year] generally are considered immune to measles and mumps. Adults born in [birth year] or later should have 1 or more doses of MMR vaccine unless there is a contraindication to the vaccine or there is laboratory evidence of immunity to each of the three diseases. A routine second dose of MMR vaccine should be obtained at least 28 days after the first dose for students attending postsecondary schools, health care workers, or international travelers. People who received inactivated measles vaccine or an unknown type of measles vaccine during 1963-1967 should receive 2 doses of MMR vaccine. People who received inactivated mumps vaccine or an unknown type of mumps vaccine before 1979 and are at high risk for mumps infection should consider immunization with 2 doses of MMR vaccine. For females of childbearing age, rubella immunity should be determined. If there is no evidence of immunity, females who are not pregnant should be vaccinated. If there is no evidence of immunity, females who are pregnant should delay immunization until after pregnancy. Unvaccinated health care workers born before [birth year] who lack laboratory evidence of measles, mumps, or rubella immunity or laboratory confirmation of disease should consider measles and mumps immunization with 2 doses of MMR vaccine or rubella  immunization with 1 dose of MMR vaccine.  Pneumococcal 13-valent conjugate (PCV13) vaccine. When indicated, a person who is uncertain of his immunization history and has no record of immunization should receive the PCV13 vaccine. All adults 77 years of age and older should receive this vaccine. An adult aged 56 years or older who has certain medical conditions and has not been previously immunized should receive 1 dose of PCV13 vaccine. This PCV13 should be followed with a dose of pneumococcal polysaccharide (PPSV23) vaccine. Adults who are at high risk for pneumococcal disease should obtain the PPSV23 vaccine at least 8 weeks after the dose of PCV13 vaccine. Adults older than 65 years of age who have normal immune system function should obtain the PPSV23 vaccine dose at least 1 year after the dose of PCV13 vaccine.  Pneumococcal polysaccharide (PPSV23) vaccine. When PCV13 is also indicated, PCV13 should be obtained first. All adults aged 44 years and older should be immunized. An adult younger than age 26 years who has certain medical conditions should be immunized. Any person who resides in a nursing home or long-term care facility should be immunized. An adult smoker should be immunized. People with an immunocompromised condition and certain other conditions should receive both PCV13 and PPSV23 vaccines. People with human immunodeficiency virus (HIV) infection should be immunized as soon as possible after diagnosis. Immunization during chemotherapy or radiation therapy should be avoided. Routine use of PPSV23 vaccine is not recommended for American Indians, [place] Natives, or people younger than 65 years unless there are medical conditions that require PPSV23 vaccine. When indicated, people who have unknown immunization and have no record of immunization should receive PPSV23 vaccine. One-time revaccination 5 years after the first dose of PPSV23 is recommended for people aged 19-64 years who have chronic  kidney failure, nephrotic syndrome, asplenia, or immunocompromised conditions. People who received 1-2 doses of PPSV23 before age 90 years should receive another dose of PPSV23 vaccine at age 18 years or  later if at least 5 years have passed since the previous dose. Doses of PPSV23 are not needed for people immunized with PPSV23 at or after age 80 years.  Meningococcal vaccine. Adults with asplenia or persistent complement component deficiencies should receive 2 doses of quadrivalent meningococcal conjugate (MenACWY-D) vaccine. The doses should be obtained at least 2 months apart. Microbiologists working with certain meningococcal bacteria, [place] recruits, people at risk during an outbreak, and people who travel to or live in countries with a high rate of meningitis should be immunized. A first-year college student up through age 55 years who is living in a residence hall should receive a dose if she did not receive a dose on or after her 16th birthday. Adults who have certain high-risk conditions should receive one or more doses of vaccine.  Hepatitis A vaccine. Adults who wish to be protected from this disease, have certain high-risk conditions, work with hepatitis A-infected animals, work in hepatitis A research labs, or travel to or work in countries with a high rate of hepatitis A should be immunized. Adults who were previously unvaccinated and who anticipate close contact with an international adoptee during the first 60 days after arrival in the [place] from a country with a high rate of hepatitis A should be immunized.  Hepatitis B vaccine. Adults who wish to be protected from this disease, have certain high-risk conditions, may be exposed to blood or other infectious body fluids, are household contacts or sex partners of hepatitis B positive people, are clients or workers in certain care facilities, or travel to or work in countries with a high rate of hepatitis B should be  immunized.  Haemophilus influenzae type b (Hib) vaccine. A previously unvaccinated person with asplenia or sickle cell disease or having a scheduled splenectomy should receive 1 dose of Hib vaccine. Regardless of previous immunization, a recipient of a hematopoietic stem cell transplant should receive a 3-dose series 6-12 months after her successful transplant. Hib vaccine is not recommended for adults with HIV infection. Preventive Services / Frequency Ages 44 to 34 years  Blood pressure check.** / Every 3-5 years.  Lipid and cholesterol check.** / Every 5 years beginning at age 68.  Clinical breast exam.** / Every 3 years for women in their 2s and 38s.  BRCA-related cancer risk assessment.** / For women who have family members with a BRCA-related cancer (breast, ovarian, tubal, or peritoneal cancers).  Pap test.** / Every 2 years from ages 70 through 13. Every 3 years starting at age 37 through age 39 or 53 with a history of 3 consecutive normal Pap tests.  HPV screening.** / Every 3 years from ages 25 through ages 73 to 66 with a history of 3 consecutive normal Pap tests.  Hepatitis C blood test.** / For any individual with known risks for hepatitis C.  Skin self-exam. / Monthly.  Influenza vaccine. / Every year.  Tetanus, diphtheria, and acellular pertussis (Tdap, Td) vaccine.** / Consult your health care provider. Pregnant women should receive 1 dose of Tdap vaccine during each pregnancy. 1 dose of Td every 10 years.  Varicella vaccine.** / Consult your health care provider. Pregnant females who do not have evidence of immunity should receive the first dose after pregnancy.  HPV vaccine. / 3 doses over 6 months, if 51 and younger. The vaccine is not recommended for use in pregnant females. However, pregnancy testing is not needed before receiving a dose.  Measles, mumps, rubella (MMR) vaccine.** / You need at  least 1 dose of MMR if you were born in 1957 or later. You may also need  a 2nd dose. For females of childbearing age, rubella immunity should be determined. If there is no evidence of immunity, females who are not pregnant should be vaccinated. If there is no evidence of immunity, females who are pregnant should delay immunization until after pregnancy.  Pneumococcal 13-valent conjugate (PCV13) vaccine.** / Consult your health care provider.  Pneumococcal polysaccharide (PPSV23) vaccine.** / 1 to 2 doses if you smoke cigarettes or if you have certain conditions.  Meningococcal vaccine.** / 1 dose if you are age 95 to 16 years and a Market researcher living in a residence hall, or have one of several medical conditions, you need to get vaccinated against meningococcal disease. You may also need additional booster doses.  Hepatitis A vaccine.** / Consult your health care provider.  Hepatitis B vaccine.** / Consult your health care provider.  Haemophilus influenzae type b (Hib) vaccine.** / Consult your health care provider. Ages 47 to 61 years  Blood pressure check.** / Every year.  Lipid and cholesterol check.** / Every 5 years beginning at age 54 years.  Lung cancer screening. / Every year if you are aged 68-80 years and have a 30-pack-year history of smoking and currently smoke or have quit within the past 15 years. Yearly screening is stopped once you have quit smoking for at least 15 years or develop a health problem that would prevent you from having lung cancer treatment.  Clinical breast exam.** / Every year after age 42 years.  BRCA-related cancer risk assessment.** / For women who have family members with a BRCA-related cancer (breast, ovarian, tubal, or peritoneal cancers).  Mammogram.** / Every year beginning at age 53 years and continuing for as long as you are in good health. Consult with your health care provider.  Pap test.** / Every 3 years starting at age 66 years through age 75 or 62 years with a history of 3 consecutive normal Pap  tests.  HPV screening.** / Every 3 years from ages 77 years through ages 36 to 52 years with a history of 3 consecutive normal Pap tests.  Fecal occult blood test (FOBT) of stool. / Every year beginning at age 73 years and continuing until age 25 years. You may not need to do this test if you get a colonoscopy every 10 years.  Flexible sigmoidoscopy or colonoscopy.** / Every 5 years for a flexible sigmoidoscopy or every 10 years for a colonoscopy beginning at age 75 years and continuing until age 11 years.  Hepatitis C blood test.** / For all people born from [birth year] through 1965 and any individual with known risks for hepatitis C.  Skin self-exam. / Monthly.  Influenza vaccine. / Every year.  Tetanus, diphtheria, and acellular pertussis (Tdap/Td) vaccine.** / Consult your health care provider. Pregnant women should receive 1 dose of Tdap vaccine during each pregnancy. 1 dose of Td every 10 years.  Varicella vaccine.** / Consult your health care provider. Pregnant females who do not have evidence of immunity should receive the first dose after pregnancy.  Zoster vaccine.** / 1 dose for adults aged 28 years or older.  Measles, mumps, rubella (MMR) vaccine.** / You need at least 1 dose of MMR if you were born in 1957 or later. You may also need a second dose. For females of childbearing age, rubella immunity should be determined. If there is no evidence of immunity, females who are not pregnant should  be vaccinated. If there is no evidence of immunity, females who are pregnant should delay immunization until after pregnancy.  Pneumococcal 13-valent conjugate (PCV13) vaccine.** / Consult your health care provider.  Pneumococcal polysaccharide (PPSV23) vaccine.** / 1 to 2 doses if you smoke cigarettes or if you have certain conditions.  Meningococcal vaccine.** / Consult your health care provider.  Hepatitis A vaccine.** / Consult your health care provider.  Hepatitis B vaccine.** / Consult  your health care provider.  Haemophilus influenzae type b (Hib) vaccine.** / Consult your health care provider. Ages 5 years and over  Blood pressure check.** / Every year.  Lipid and cholesterol check.** / Every 5 years beginning at age 12 years.  Lung cancer screening. / Every year if you are aged 32-80 years and have a 30-pack-year history of smoking and currently smoke or have quit within the past 15 years. Yearly screening is stopped once you have quit smoking for at least 15 years or develop a health problem that would prevent you from having lung cancer treatment.  Clinical breast exam.** / Every year after age 22 years.  BRCA-related cancer risk assessment.** / For women who have family members with a BRCA-related cancer (breast, ovarian, tubal, or peritoneal cancers).  Mammogram.** / Every year beginning at age 55 years and continuing for as long as you are in good health. Consult with your health care provider.  Pap test.** / Every 3 years starting at age 85 years through age 37 or 69 years with 3 consecutive normal Pap tests. Testing can be stopped between 65 and 70 years with 3 consecutive normal Pap tests and no abnormal Pap or HPV tests in the past 10 years.  HPV screening.** / Every 3 years from ages 24 years through ages 69 or 91 years with a history of 3 consecutive normal Pap tests. Testing can be stopped between 65 and 70 years with 3 consecutive normal Pap tests and no abnormal Pap or HPV tests in the past 10 years.  Fecal occult blood test (FOBT) of stool. / Every year beginning at age 69 years and continuing until age 93 years. You may not need to do this test if you get a colonoscopy every 10 years.  Flexible sigmoidoscopy or colonoscopy.** / Every 5 years for a flexible sigmoidoscopy or every 10 years for a colonoscopy beginning at age 11 years and continuing until age 66 years.  Hepatitis C blood test.** / For all people born from [birth year] through 1965 and any  individual with known risks for hepatitis C.  Osteoporosis screening.** / A one-time screening for women ages 70 years and over and women at risk for fractures or osteoporosis.  Skin self-exam. / Monthly.  Influenza vaccine. / Every year.  Tetanus, diphtheria, and acellular pertussis (Tdap/Td) vaccine.** / 1 dose of Td every 10 years.  Varicella vaccine.** / Consult your health care provider.  Zoster vaccine.** / 1 dose for adults aged 40 years or older.  Pneumococcal 13-valent conjugate (PCV13) vaccine.** / Consult your health care provider.  Pneumococcal polysaccharide (PPSV23) vaccine.** / 1 dose for all adults aged 67 years and older.  Meningococcal vaccine.** / Consult your health care provider.  Hepatitis A vaccine.** / Consult your health care provider.  Hepatitis B vaccine.** / Consult your health care provider.  Haemophilus influenzae type b (Hib) vaccine.** / Consult your health care provider. ** Family history and personal history of risk and conditions may change your health care provider's recommendations.   This information is not  intended to replace advice given to you by your health care provider. Make sure you discuss any questions you have with your health care provider.   Document Released: 08/01/2001 Document Revised: 06/26/2014 Document Reviewed: 10/31/2010 Elsevier Interactive Patient Education [employer]. .

## 2015-08-11 NOTE — Assessment & Plan Note (Signed)
Body mass index is 31.05 kg/(m^2). Diet and exercise measures reviewed

## 2015-08-11 NOTE — Progress Notes (Signed)
Pre visit review using our clinic review tool, if applicable. No additional management support is needed unless otherwise documented below in the visit note. 

## 2015-08-11 NOTE — Telephone Encounter (Signed)
Attempted to leave a message for patient. (phone rings then clicks)

## 2015-08-12 LAB — COMPREHENSIVE METABOLIC PANEL
ALK PHOS: 84 U/L (ref 39–117)
ALT: 41 U/L — ABNORMAL HIGH (ref 0–35)
AST: 34 U/L (ref 0–37)
Albumin: 4.6 g/dL (ref 3.5–5.2)
BUN: 16 mg/dL (ref 6–23)
CO2: 31 mEq/L (ref 19–32)
CREATININE: 0.65 mg/dL (ref 0.40–1.20)
Calcium: 9.7 mg/dL (ref 8.4–10.5)
Chloride: 101 mEq/L (ref 96–112)
GFR: 97.45 mL/min (ref >60.00)
GLUCOSE: 102 mg/dL — AB (ref 70–99)
POTASSIUM: 4.4 meq/L (ref 3.5–5.1)
SODIUM: 139 meq/L (ref 135–145)
TOTAL PROTEIN: 7.3 g/dL (ref 6.0–8.3)
Total Bilirubin: 0.6 mg/dL (ref 0.2–1.2)

## 2015-08-12 LAB — CBC
HCT: 45.1 % (ref 36.0–46.0)
Hemoglobin: 15.1 g/dL — ABNORMAL HIGH (ref 12.0–15.0)
MCHC: 33.4 g/dL (ref 30.0–36.0)
MCV: 90.5 fl (ref 78.0–100.0)
Platelets: 285 10*3/uL (ref 150.0–400.0)
RBC: 4.98 Mil/uL (ref 3.87–5.11)
RDW: 14.5 % (ref 11.5–15.5)
WBC: 8.4 10*3/uL (ref 4.0–10.5)

## 2015-08-12 LAB — URINALYSIS, ROUTINE W REFLEX MICROSCOPIC
BILIRUBIN URINE: NEGATIVE
HGB URINE DIPSTICK: NEGATIVE
Ketones, ur: NEGATIVE
Leukocytes, UA: NEGATIVE
NITRITE: NEGATIVE
PH: 5.5 (ref 5.0–8.0)
RBC / HPF: NONE SEEN (ref 0–?)
Specific Gravity, Urine: >=1.03 — AB (ref 1.000–1.030)
Total Protein, Urine: NEGATIVE
Urine Glucose: NEGATIVE
Urobilinogen, UA: 0.2 (ref 0.0–1.0)
WBC UA: NONE SEEN (ref 0–?)

## 2015-08-12 LAB — HEMOGLOBIN A1C: Hgb A1c MFr Bld: 6.7 % — ABNORMAL HIGH (ref 4.6–6.5)

## 2015-08-12 LAB — LIPID PANEL
Cholesterol: 198 mg/dL (ref 0–200)
HDL: 48.8 mg/dL (ref >39.00)
NONHDL: 149.19
TRIGLYCERIDES: 266 mg/dL — AB (ref 0.0–149.0)
Total CHOL/HDL Ratio: 4
VLDL: 53.2 mg/dL — ABNORMAL HIGH (ref 0.0–40.0)

## 2015-08-12 LAB — LDL CHOLESTEROL, DIRECT: Direct LDL: 123 mg/dL

## 2015-08-12 NOTE — Telephone Encounter (Signed)
Patient did not return calls

## 2015-08-17 ENCOUNTER — Other Ambulatory Visit: Payer: Self-pay | Admitting: Physician Assistant

## 2015-08-17 DIAGNOSIS — R740 Nonspecific elevation of levels of transaminase and lactic acid dehydrogenase [LDH]: Principal | ICD-10-CM

## 2015-08-17 DIAGNOSIS — R7401 Elevation of levels of liver transaminase levels: Secondary | ICD-10-CM

## 2015-08-18 ENCOUNTER — Encounter: Payer: BLUE CROSS/BLUE SHIELD | Admitting: Gastroenterology

## 2015-08-19 ENCOUNTER — Other Ambulatory Visit: Payer: Self-pay | Admitting: Obstetrics and Gynecology

## 2015-08-19 ENCOUNTER — Telehealth: Payer: Self-pay

## 2015-08-19 NOTE — Telephone Encounter (Signed)
Patient called she  Had a biopsy this morning and was informed to take motrin for patient today and tomorrow for pain. She was informed by Einar Pheasant to back off of tylenol due to elevated liver function patient wants to know is it ok to take motrin patient can be contacted at [phone]

## 2015-08-19 NOTE — Telephone Encounter (Signed)
I would discourage her from taking Motrin due to risk of bleeding after biopsy especially since she has Plavix as well. Looking at her Liver functions, Tylenol for a few days will be fine. Instruct Tylenol 500 mg tabs 2 tabs po 3 x daily prn pain.

## 2015-08-20 NOTE — Telephone Encounter (Signed)
Called and left detailed message -- ok to use Tylenol PM short term for pain and sleep. If she is wanting to discuss chronic insomnia treatment she is to call back so we can discuss further.

## 2015-08-20 NOTE — Telephone Encounter (Signed)
Patient would like to know if tylenol PM is ok to take please call patient directly to address her questions [phone]

## 2015-08-24 ENCOUNTER — Telehealth: Payer: Self-pay | Admitting: Physician Assistant

## 2015-08-24 NOTE — Telephone Encounter (Signed)
Relation to [id number] Call back number: [phone]  Pharmacy: [facility], [place] [phone] (Phone) [phone] (Fax)         Reason for call:  Patient would like a medication to help her fall asleep, patient states not a sleeping medication. Patient would like to speak with the nurse directly.

## 2015-08-25 NOTE — Telephone Encounter (Signed)
Called and [facility] @ 10:56am @ [phone]) asking the pt to RTC regarding note below.//AB/CMA

## 2015-08-25 NOTE — Telephone Encounter (Signed)
Called and spoke with the pt and informed her that I spoke with Einar Pheasant and he stated that she can try Melatonin (3-5mg ) first,and if the don't work she can try Unisom,ZZZ Quil, and if they don't work we can sent in a prescription for Trazodone.   Pt stated that she will try the Melatonin and the Unisom first.  She said she want try the ZZZ Quil if its in liquid form.  Pt stated if the others do not work she will give [place] a call back and she will try the Trazodone.//AB/CMA

## 2015-09-30 ENCOUNTER — Other Ambulatory Visit: Payer: Self-pay | Admitting: Cardiology

## 2015-12-08 ENCOUNTER — Telehealth: Payer: Self-pay | Admitting: Gastroenterology

## 2015-12-08 NOTE — Telephone Encounter (Signed)
Unable to reach patient.  No answer.

## 2015-12-08 NOTE — Telephone Encounter (Signed)
Unable to reach patient.

## 2015-12-09 NOTE — Telephone Encounter (Signed)
Unable to reach patient.

## 2015-12-10 NOTE — Telephone Encounter (Signed)
Patient called in and discussed that she is taking maximum dose of Zantac. Offered to send a note to MD to change medications but she is going on a 3 week trip and does not want to change meds at this time. She will try Gaviscon prn for breakthrough. Patient states she does not accept blocked calls and wants to know if my number is blocked.(it is) She will alert the operator when she calls [place] next time that she needs to speak with nurse or be called on unblocked phone.

## 2015-12-10 NOTE — Telephone Encounter (Signed)
Called number patient gave [place] again. There is no answering machine only a clicking noise. Unable to leave a message.

## 2015-12-10 NOTE — Telephone Encounter (Signed)
Patient returning phone call. I informed her that several attempts have been made to reach her.  She states that she is upset that no message was left. She states that if no message was left then she wouldn't know to return call. Best # [phone]. She will be leaving around 1:45pm to go to an appointment and will be out all afternoon. Patient will be going out of the country on [place]19[place]15.

## 2015-12-14 ENCOUNTER — Ambulatory Visit (INDEPENDENT_AMBULATORY_CARE_PROVIDER_SITE_OTHER): Payer: BLUE CROSS/BLUE SHIELD | Admitting: Cardiology

## 2015-12-14 ENCOUNTER — Other Ambulatory Visit: Payer: Self-pay

## 2015-12-14 ENCOUNTER — Encounter: Payer: Self-pay | Admitting: Cardiology

## 2015-12-14 VITALS — BP 132/70 | HR 83 | Ht 61.0 in | Wt 167.8 lb

## 2015-12-14 DIAGNOSIS — I10 Essential (primary) hypertension: Secondary | ICD-10-CM

## 2015-12-14 DIAGNOSIS — I351 Nonrheumatic aortic (valve) insufficiency: Secondary | ICD-10-CM | POA: Diagnosis not present

## 2015-12-14 DIAGNOSIS — E785 Hyperlipidemia, unspecified: Secondary | ICD-10-CM

## 2015-12-14 DIAGNOSIS — I2510 Atherosclerotic heart disease of native coronary artery without angina pectoris: Secondary | ICD-10-CM | POA: Diagnosis not present

## 2015-12-14 MED ORDER — CLOPIDOGREL BISULFATE 75 MG PO TABS: 75.0000 mg | ORAL_TABLET | Freq: Every day | ORAL | Status: DC

## 2015-12-14 MED ORDER — METOPROLOL TARTRATE 25 MG PO TABS: ORAL_TABLET | ORAL | Status: DC

## 2015-12-14 MED ORDER — NITROGLYCERIN 0.4 MG SL SUBL: 0.4000 mg | SUBLINGUAL_TABLET | SUBLINGUAL | Status: DC | PRN

## 2015-12-14 MED ORDER — ATORVASTATIN CALCIUM 80 MG PO TABS: 80.0000 mg | ORAL_TABLET | Freq: Every day | ORAL | Status: DC

## 2015-12-14 MED ORDER — OMEGA-3-ACID ETHYL ESTERS 1 G PO CAPS: 2.0000 g | ORAL_CAPSULE | Freq: Two times a day (BID) | ORAL | Status: DC

## 2015-12-14 NOTE — Patient Instructions (Addendum)
Medication Instructions:  1) Start Lovaza 2 grams twice daily   Labwork: Your physician recommends that you return for lab work in: Aug after you come back from trip--you can call to schedule-will need to be fasting     Testing/Procedures: None ordered   Follow-Up: Your physician recommends that you schedule a follow-up appointment in: 4 months with Dr Aundra Dubin    Any Other Special Instructions Will Be Listed Below (If Applicable).     If you need a refill on your cardiac medications before your next appointment, please call your pharmacy.

## 2015-12-16 ENCOUNTER — Other Ambulatory Visit: Payer: Self-pay | Admitting: Cardiology

## 2015-12-16 NOTE — Progress Notes (Signed)
Patient ID: Emily [name], female   DOB: Jun 21, 1950, 65 y.o.   MRN: [id number] PCP: Elyn Aquas  65 yo with history of CAD s/p inferior MI in 1/09 treated with PCI to OM3.  She has had no cardiac events since that time.  Echo in 9/16 showed EF 55-60% with mild aortic insufficiency.  Symptomatically, she has been stable.  No exertional chest pain and no exertional dyspnea.  No orthopnea/PND.  She has been on Plavix long-term since PCI.  She has stopped taking aspirin (no longer wants to take).    ECG: NSR, normal  Labs (12/14): LDL 90, HDL 41 Labs (6/15): LDL 106, HDL 48 Labs (7/16): LDL 102 Labs (2/17): K 4.4, creatinine 0.65, LDL 123, HDL 49, TGs 266  PMH: 1. CAD: Inferior MI in 1/09 with culprit vessel a large OM3.  This was treated with PCI.  Had residual 50-70% mLAD stenosis.  Echo (3/14) with EF 60-65%, mild-moderate AI 2. HTN 3. H/o fen-phen use 4. Aortic insufficiency: Mild to moderate on echo in 3/14.  5. Carotid dopplers (9/12) with mild plaque.  6. Hyperlipidemia  SH: Married with 2 children.  Husband retired.  Quit smoking in 9/08.    FH: No CAD.  Mother with hemorrhagic CVA.  ROS: All systems reviewed and negative except as per HPI.   Current Outpatient Prescriptions  Medication Sig Dispense Refill  . Acetylcarnitine HCl (ACETYL L-CARNITINE) 250 MG CAPS Take by mouth. 200 mg every other day    . albuterol (PROVENTIL HFA;VENTOLIN HFA) 108 (90 BASE) MCG/ACT inhaler Inhale 2 puffs into the lungs every 6 (six) hours as needed for wheezing or shortness of breath. 1 Inhaler 0  . ALPRAZolam (XANAX) 0.25 MG tablet Take 0.25 mg by mouth as directed. At bedtime as needed    . atorvastatin (LIPITOR) 80 MG tablet Take 1 tablet (80 mg total) by mouth daily. 90 tablet 3  . b complex vitamins tablet Take 1 tablet by mouth daily.      . Biotin 300 MCG TABS Take 600 mcg by mouth every other day.      . Calcium-Magnesium-Zinc 500-250-12.5 MG TABS Take 1 tablet by mouth every other  day.      . Chromium Picolinate 200 MCG TABS Take 1 tablet by mouth every other day.      . clobetasol cream (TEMOVATE) 0.05 % APPLY A THIN LAYER TOPICALLY TO AFFECTED AREA(S) DAILY  0  . clopidogrel (PLAVIX) 75 MG tablet Take 1 tablet (75 mg total) by mouth daily. 90 tablet 3  . Coenzyme Q10 (COQ10) 200 MG CAPS Take 1 capsule by mouth daily.      Marland Kitchen CRANBERRY JUICE EXTRACT PO Take 500 mg by mouth daily.      . Flaxseed, Linseed, (FLAX SEED OIL) 1000 MG CAPS Take 1,200 mg by mouth daily.      . Garlic Oil [id number] MG TABS Take 1 tablet by mouth daily.      . metoprolol tartrate (LOPRESSOR) 25 MG tablet TAKE 1/2 TABLET BY MOUTH 2 TIMES DAILY 90 tablet 3  . nitroGLYCERIN (NITROSTAT) 0.4 MG SL tablet Place 1 tablet (0.4 mg total) under the tongue every 5 (five) minutes x 3 doses as needed for chest pain. 25 tablet 3  . Omega-3 Fatty Acids (FISH OIL) 1200 MG CAPS Take 1 capsule by mouth daily.      . Probiotic Product (ALIGN PO) Take 1 capsule by mouth daily.    . ranitidine (ZANTAC) 150 MG tablet Take 1  tablet (150 mg total) by mouth 2 (two) times daily as needed for heartburn. 90 tablet 3  . Rhodiola 300 MG CAPS Take 1 capsule by mouth daily.      Marland Kitchen omega-3 acid ethyl esters (LOVAZA) 1 g capsule Take 2 capsules (2 g total) by mouth 2 (two) times daily. 180 capsule 3   No current facility-administered medications for this visit.    BP 132/70 mmHg  Pulse 83  Ht 5\' 1"  (1.549 m)  Wt 167 lb 12.8 oz (76.114 kg)  BMI 31.72 kg/m2  SpO2 96% General: NAD Neck: No JVD, no thyromegaly or thyroid nodule.  Lungs: Clear to auscultation bilaterally with normal respiratory effort. CV: Nondisplaced PMI.  Heart regular S1/S2, no S3/S4, 1/6 early SEM.  No peripheral edema.  No carotid bruit.  Normal pedal pulses.  Abdomen: Soft, nontender, no hepatosplenomegaly, no distention.  Skin: Intact without lesions or rashes.  Neurologic: Alert and oriented x 3.  Psych: Normal affect. Extremities: No clubbing or  cyanosis.   Assessment/Plan: 1. CAD: Stable with no ischemic symptoms.  She had PCI in 1/09 in the setting of inferior MI.  She had residual untreated 50-70% mLAD stenosis.   - Continue statin and metoprolol. - She has been on Plavix long-term with no evidence for GI bleeding.   2. Hyperlipidemia: Goal LDL < 70.  LDL was above goal when last checked.  I have suggested that she stop atorvastatin and change to Crestor 40 or continue atorvastatin and add Zetia 10 daily.  She again wants to continue to try exercise, diet and weight loss.  She will start Lovaza 2 g bid for elevated triglycerides.  - Repeat lipids in 8/17, if LDL is still too high she is willing to make med adjustments.  3. Aortic insufficiency: Most recent echo showed only mild aortic insufficiency (had been moderate in the past).    [name] 12/16/2015

## 2016-01-05 ENCOUNTER — Other Ambulatory Visit: Payer: Self-pay

## 2016-01-05 MED ORDER — RANITIDINE HCL 150 MG PO TABS: 150.0000 mg | ORAL_TABLET | Freq: Two times a day (BID) | ORAL | Status: DC | PRN

## 2016-01-14 ENCOUNTER — Telehealth: Payer: Self-pay | Admitting: Cardiology

## 2016-01-14 DIAGNOSIS — I2510 Atherosclerotic heart disease of native coronary artery without angina pectoris: Secondary | ICD-10-CM

## 2016-01-14 DIAGNOSIS — I351 Nonrheumatic aortic (valve) insufficiency: Secondary | ICD-10-CM

## 2016-01-14 NOTE — Telephone Encounter (Signed)
Patient has lab appointment for 8/1. I am routing message to Dr. Aundra Dubin for approval to add order for A1C.

## 2016-01-14 NOTE — Telephone Encounter (Signed)
New message   Pt is calling to request that another order for lab be added for her A1C

## 2016-01-15 NOTE — Telephone Encounter (Signed)
That is fine 

## 2016-01-17 NOTE — Telephone Encounter (Signed)
Pt notified HGB A1C added to lab for 01/18/16

## 2016-01-18 ENCOUNTER — Other Ambulatory Visit: Payer: BLUE CROSS/BLUE SHIELD | Admitting: *Deleted

## 2016-01-18 DIAGNOSIS — E785 Hyperlipidemia, unspecified: Secondary | ICD-10-CM

## 2016-01-18 DIAGNOSIS — I2510 Atherosclerotic heart disease of native coronary artery without angina pectoris: Secondary | ICD-10-CM

## 2016-01-18 DIAGNOSIS — I351 Nonrheumatic aortic (valve) insufficiency: Secondary | ICD-10-CM

## 2016-01-18 LAB — LIPID PANEL
CHOLESTEROL: 170 mg/dL (ref 125–200)
HDL: 52 mg/dL (ref >=46)
LDL CALC: 85 mg/dL (ref <130)
TRIGLYCERIDES: 165 mg/dL — AB (ref <150)
Total CHOL/HDL Ratio: 3.3 Ratio (ref <=5.0)
VLDL: 33 mg/dL — AB (ref <30)

## 2016-01-18 LAB — HEPATIC FUNCTION PANEL
ALBUMIN: 4.3 g/dL (ref 3.6–5.1)
ALT: 25 U/L (ref 6–29)
AST: 23 U/L (ref 10–35)
Alkaline Phosphatase: 80 U/L (ref 33–130)
BILIRUBIN INDIRECT: 0.6 mg/dL (ref 0.2–1.2)
Bilirubin, Direct: 0.2 mg/dL (ref <=0.2)
TOTAL PROTEIN: 6.4 g/dL (ref 6.1–8.1)
Total Bilirubin: 0.8 mg/dL (ref 0.2–1.2)

## 2016-01-18 LAB — HEMOGLOBIN A1C
Hgb A1c MFr Bld: 6.1 % — ABNORMAL HIGH (ref <5.7)
MEAN PLASMA GLUCOSE: 128 mg/dL

## 2016-01-19 ENCOUNTER — Telehealth: Payer: Self-pay | Admitting: Cardiology

## 2016-01-19 NOTE — Telephone Encounter (Signed)
New message       Calling to see if Dr Aundra Dubin checked her hgb A1c along with the other labs.  Please call

## 2016-01-19 NOTE — Telephone Encounter (Signed)
Calling wanting to know if Dr. Aundra Dubin checked her A1C.  She didn't see it resulted in My Chart.  Advised that he did check the A1C but he has not resulted it yet. Will be released when he comments on result.  She verbalizes understanding and will wait for results.

## 2016-02-09 ENCOUNTER — Telehealth: Payer: Self-pay | Admitting: Physician Assistant

## 2016-02-09 NOTE — Telephone Encounter (Signed)
I did not prescribe. Cardiology did Emily [name]). I would recommend she stop and call his office for further guidance.

## 2016-02-09 NOTE — Telephone Encounter (Signed)
Notified patient to call Cardiologist office.

## 2016-02-09 NOTE — Telephone Encounter (Signed)
Caller name: Relationship to patient: Self Can be reached:[phone] Pharmacy:  Reason for call: Patient called stating that the Fish Oil she was prescribed is causing diarrhea. Plse adv

## 2016-02-10 NOTE — Telephone Encounter (Signed)
Pt states she feels lovaza is causing GI side effects, particularly soft frequent stools.  Pt advised to stop lovaza, I will forward to Dr Aundra Dubin for review.

## 2016-02-10 NOTE — Telephone Encounter (Signed)
Could potentially cause loose stool.  Have her try over the counter fish oil, concentration not as strong.  Keep refrigerated.  Take 2 g daily.

## 2016-02-10 NOTE — Telephone Encounter (Signed)
F/u      Pt calling stating that the fish oil is giving her diarrhea. She only takes the pills 2 once a day instead of advised. She states that she is fairly certain that the pill is the cause of her going 4-5 times a day. Please advise.

## 2016-02-10 NOTE — Telephone Encounter (Signed)
Pt advised,verbalized understanding. 

## 2016-03-05 ENCOUNTER — Other Ambulatory Visit: Payer: Self-pay | Admitting: Gastroenterology

## 2016-04-04 ENCOUNTER — Encounter: Payer: Self-pay | Admitting: Physician Assistant

## 2016-04-04 ENCOUNTER — Ambulatory Visit (INDEPENDENT_AMBULATORY_CARE_PROVIDER_SITE_OTHER): Payer: BLUE CROSS/BLUE SHIELD | Admitting: Physician Assistant

## 2016-04-04 VITALS — BP 118/70 | HR 62 | Temp 97.8°F | Resp 16 | Ht 61.0 in | Wt 158.1 lb

## 2016-04-04 DIAGNOSIS — B029 Zoster without complications: Secondary | ICD-10-CM | POA: Diagnosis not present

## 2016-04-04 MED ORDER — ALPRAZOLAM 0.25 MG PO TABS: ORAL_TABLET | ORAL | 1 refills | Status: DC

## 2016-04-04 NOTE — Progress Notes (Signed)
Patient presents to clinic today c/o rash of Right gluteal region x 5 days. Was seen at [facility] on 03/31/16 and diagnosed with shingles. Was started on treatment with Valtrex 1 g TID. Is taking as directed. Patient endorses initial burning/itching and tingling of the area prior to rash onset. Endorses these symptoms have resolved since starting medication. Notes blisters have scabbed over. Is wanting a second opinion regarding rash.  Past Medical History:  Diagnosis Date  . Anxiety   . CAD (coronary artery disease)   . Chronic insomnia   . Colon polyp   . Depression   . Heart attack 1-09  . Hyperlipemia   . Hypertension   . Perirectal abscess     Current Outpatient Prescriptions on File Prior to Visit  Medication Sig Dispense Refill  . Acetylcarnitine HCl (ACETYL L-CARNITINE) 250 MG CAPS Take by mouth. 200 mg every other day    . albuterol (PROVENTIL HFA;VENTOLIN HFA) 108 (90 BASE) MCG/ACT inhaler Inhale 2 puffs into the lungs every 6 (six) hours as needed for wheezing or shortness of breath. 1 Inhaler 0  . ALPRAZolam (XANAX) 0.25 MG tablet Take 0.25 mg by mouth as directed. At bedtime as needed    . atorvastatin (LIPITOR) 80 MG tablet Take 1 tablet (80 mg total) by mouth daily. 90 tablet 3  . b complex vitamins tablet Take 1 tablet by mouth daily.      . Calcium-Magnesium-Zinc 500-250-12.5 MG TABS Take 1 tablet by mouth every other day.      . Chromium Picolinate 200 MCG TABS Take 1 tablet by mouth every other day.      . clobetasol cream (TEMOVATE) 0.05 % APPLY A THIN LAYER TOPICALLY TO AFFECTED AREA(S) DAILY  0  . clopidogrel (PLAVIX) 75 MG tablet Take 1 tablet (75 mg total) by mouth daily. 90 tablet 3  . Coenzyme Q10 (COQ10) 200 MG CAPS Take 1 capsule by mouth daily.      Marland Kitchen CRANBERRY JUICE EXTRACT PO Take 500 mg by mouth daily.      . Flaxseed, Linseed, (FLAX SEED OIL) 1000 MG CAPS Take 1,200 mg by mouth daily.      . Garlic Oil [id number] MG TABS Take 1 tablet by mouth daily.      .  metoprolol tartrate (LOPRESSOR) 25 MG tablet TAKE 1/2 TABLET BY MOUTH 2 TIMES DAILY 90 tablet 3  . nitroGLYCERIN (NITROSTAT) 0.4 MG SL tablet Place 1 tablet (0.4 mg total) under the tongue every 5 (five) minutes x 3 doses as needed for chest pain. 25 tablet 3  . Probiotic Product (ALIGN PO) Take 1 capsule by mouth daily.    . ranitidine (ZANTAC) 150 MG tablet TAKE 1 TABLET BY MOUTH 2 TIMES DAILY AS NEEDED FOR HEARTBURN 60 tablet 1  . Rhodiola 300 MG CAPS Take 1 capsule by mouth daily.      . Biotin 300 MCG TABS Take 600 mcg by mouth every other day.       No current facility-administered medications on file prior to visit.     Allergies  Allergen Reactions  . Chlorpromazine Hcl     unknown  . Codeine     REACTION: causes vomiting  . Hydrocodone     REACTION: causes vomiing and nausea  . Hydrocodone-Acetaminophen Nausea And Vomiting  . Lovaza [Omega-3-Acid Ethyl Esters] Other (See Comments)    Loose stools  . Penicillins     REACTION: causes rash  . Prochlorperazine Edisylate     unknown   .  Promethazine Hcl     unknown  . Sulfonamide Derivatives     unknown     Family History  Problem Relation Age of Onset  . Skin cancer Mother   . Allergy (severe) Father   . Renal Disease Paternal Grandfather     one removed   . Colon cancer Neg Hx     Social History   Social History  . Marital status: Married    Spouse name: N/A  . Number of children: 2  . Years of education: N/A   Occupational History  . housewife/ student    Social History Main Topics  . Smoking status: Former Smoker    Packs/day: 2.00    Years: 20.00    Types: Cigarettes    Quit date: 06/19/2006  . Smokeless tobacco: Never Used  . Alcohol use 0.0 oz/week     Comment: maybe 2 a month   . Drug use: No  . Sexual activity: Not Asked   Other Topics Concern  . None   Social History Narrative   Resides in [place] with her husband.    Husband works in [place] and comes home two to three times a year     2 children who go to [place]   Just got accepted to [employer]   Exercise- NO    Review of Systems - See HPI.  All other ROS are negative.  BP (!) 0/0 (BP Location: Left Arm, Patient Position: Sitting, Cuff Size: Normal)   Ht 5\' 1"  (1.549 m)   Wt 158 lb 2 oz (71.7 kg)   BMI 29.88 kg/m   Physical Exam  Constitutional: She is well-developed, well-nourished, and in no distress.  HENT:  Head: Normocephalic and atraumatic.  Cardiovascular: Normal rate, regular rhythm, normal heart sounds and intact distal pulses.   Pulmonary/Chest: Effort normal.  Neurological: She is alert.  Skin: Skin is warm and dry.     Vitals reviewed.   Recent Results (from the past 2160 hour(s))  Lipid Profile     Status: Abnormal   Collection Time: 01/18/16 11:09 AM  Result Value Ref Range   Cholesterol 170 125 - 200 mg/dL   Triglycerides 165 (H) <150 mg/dL   HDL 52 >=46 mg/dL   Total CHOL/HDL Ratio 3.3 <=5.0 Ratio   VLDL 33 (H) <30 mg/dL   LDL Cholesterol 85 <130 mg/dL    Comment:   Total Cholesterol/HDL Ratio:CHD Risk                        Coronary Heart Disease Risk Table                                        Men       Women          1/2 Average Risk              3.4        3.3              Average Risk              5.0        4.4           2X Average Risk              9.6        7.1  3X Average Risk             23.4       11.0 Use the calculated Patient Ratio above and the CHD Risk table  to determine the patient's CHD Risk.   Hepatic function panel     Status: None   Collection Time: 01/18/16 11:09 AM  Result Value Ref Range   Total Bilirubin 0.8 0.2 - 1.2 mg/dL   Bilirubin, Direct 0.2 <=0.2 mg/dL   Indirect Bilirubin 0.6 0.2 - 1.2 mg/dL   Alkaline Phosphatase 80 33 - 130 U/L   AST 23 10 - 35 U/L   ALT 25 6 - 29 U/L   Total Protein 6.4 6.1 - 8.1 g/dL   Albumin 4.3 3.6 - 5.1 g/dL  HgB A1c     Status: Abnormal   Collection Time: 01/18/16 11:09 AM  Result Value Ref Range   Hgb  A1c MFr Bld 6.1 (H) <5.7 %    Comment:   For someone without known diabetes, a hemoglobin A1c value between 5.7% and 6.4% is consistent with prediabetes and should be confirmed with a follow-up test.   For someone with known diabetes, a value <7% indicates that their diabetes is well controlled. A1c targets should be individualized based on duration of diabetes, age, co-morbid conditions and other considerations.   This assay result is consistent with an increased risk of diabetes.   Currently, no consensus exists regarding use of hemoglobin A1c for diagnosis of diabetes in children.      Mean Plasma Glucose 128 mg/dL    Assessment/Plan: 1. Herpes zoster without complication Agree with diagnosis of shingles rash. Starting to scab over and healing. Continue Valtrex. Patient to keep lesions covered. Avoid contact with children and pregnant women. Topical lidocaine if needed. Patient requesting to FU Friday to make sure all vesicles are scabbed over. FU scheduled.   Leeanne Rio, PA-C

## 2016-04-04 NOTE — Patient Instructions (Signed)
Please keep skin clean and dry Continue taking the entire course of the Valtrex. I am glad it has made a big improvement for you.  Remember you are contagious until all blisters have scabbed over. Giving your concerns regarding your weekend plans, please schedule a follow-up with me on Friday.   Shingles Shingles is an infection that causes a painful skin rash and fluid-filled blisters. Shingles is caused by the same virus that causes chickenpox. Shingles only develops in people who:  Have had chickenpox.  Have gotten the chickenpox vaccine. (This is rare.) The first symptoms of shingles may be itching, tingling, or pain in an area on your skin. A rash will follow in a few days or weeks. The rash is usually on one side of the body in a bandlike or beltlike pattern. Over time, the rash turns into fluid-filled blisters that break open, scab over, and dry up. Medicines may:  Help you manage pain.  Help you recover more quickly.  Help to prevent long-term problems. HOME CARE Medicines  Take medicines only as told by your doctor.  Apply an anti-itch or numbing cream to the affected area as told by your doctor. Blister and Rash Care  Take a cool bath or put cool compresses on the area of the rash or blisters as told by your doctor. This may help with pain and itching.  Keep your rash covered with a loose bandage (dressing). Wear loose-fitting clothing.  Keep your rash and blisters clean with mild soap and cool water or as told by your doctor.  Check your rash every day for signs of infection. These include redness, swelling, and pain that lasts or gets worse.  Do not pick your blisters.  Do not scratch your rash. General Instructions  Rest as told by your doctor.  Keep all follow-up visits as told by your doctor. This is important.  Until your blisters scab over, your infection can cause chickenpox in people who have never had it or been vaccinated against it. To prevent  this from happening, avoid touching other people or being around other people, especially:  Babies.  Pregnant women.  Children who have eczema.  Elderly people who have transplants.  People who have chronic illnesses, such as leukemia or AIDS. GET HELP IF:  Your pain does not get better with medicine.  Your pain does not get better after the rash heals.  Your rash looks infected. Signs of infection include:  Redness.  Swelling.  Pain that lasts or gets worse. GET HELP RIGHT AWAY IF:  The rash is on your face or nose.  You have pain in your face, pain around your eye area, or loss of feeling on one side of your face.  You have ear pain or you have ringing in your ear.  You have loss of taste.  Your condition gets worse.   This information is not intended to replace advice given to you by your health care provider. Make sure you discuss any questions you have with your health care provider.   Document Released: 11/22/2007 Document Revised: 06/26/2014 Document Reviewed: 03/17/2014 Elsevier Interactive Patient Education [employer].

## 2016-04-04 NOTE — Progress Notes (Signed)
Pre visit review using our clinic review tool, if applicable. No additional management support is needed unless otherwise documented below in the visit note/SLS  

## 2016-04-07 ENCOUNTER — Ambulatory Visit (INDEPENDENT_AMBULATORY_CARE_PROVIDER_SITE_OTHER): Payer: BLUE CROSS/BLUE SHIELD | Admitting: Physician Assistant

## 2016-04-07 ENCOUNTER — Encounter: Payer: Self-pay | Admitting: Physician Assistant

## 2016-04-07 VITALS — BP 102/68 | HR 60 | Temp 98.0°F | Resp 16 | Ht 61.0 in | Wt 157.0 lb

## 2016-04-07 DIAGNOSIS — B029 Zoster without complications: Secondary | ICD-10-CM | POA: Diagnosis not present

## 2016-04-07 NOTE — Progress Notes (Signed)
Patient presents to clinic today for follow-up of shingles rash. Patient wanted rechecked before going to an even this weekend. Endorses pain mostly all resolved. Denies new blisters. Has taken course of Valtrex. Denies new or worsening symptoms.   Past Medical History:  Diagnosis Date  . Anxiety   . CAD (coronary artery disease)   . Chronic insomnia   . Colon polyp   . Depression   . Heart attack 1-09  . Hyperlipemia   . Hypertension   . Perirectal abscess     Current Outpatient Prescriptions on File Prior to Visit  Medication Sig Dispense Refill  . Acetylcarnitine HCl (ACETYL L-CARNITINE) 250 MG CAPS Take by mouth. 200 mg every other day    . albuterol (PROVENTIL HFA;VENTOLIN HFA) 108 (90 BASE) MCG/ACT inhaler Inhale 2 puffs into the lungs every 6 (six) hours as needed for wheezing or shortness of breath. 1 Inhaler 0  . ALPRAZolam (XANAX) 0.25 MG tablet Take 1-1.5 tablets by mouth as needed at bedtime 45 tablet 1  . atorvastatin (LIPITOR) 80 MG tablet Take 1 tablet (80 mg total) by mouth daily. 90 tablet 3  . b complex vitamins tablet Take 1 tablet by mouth daily.      . Biotin 300 MCG TABS Take 600 mcg by mouth every other day.      . Calcium-Magnesium-Zinc 500-250-12.5 MG TABS Take 1 tablet by mouth every other day.      . Chromium Picolinate 200 MCG TABS Take 1 tablet by mouth every other day.      . clobetasol cream (TEMOVATE) 0.05 % APPLY A THIN LAYER TOPICALLY TO AFFECTED AREA(S) DAILY  0  . clopidogrel (PLAVIX) 75 MG tablet Take 1 tablet (75 mg total) by mouth daily. 90 tablet 3  . Coenzyme Q10 (COQ10) 200 MG CAPS Take 1 capsule by mouth daily.      Marland Kitchen CRANBERRY JUICE EXTRACT PO Take 500 mg by mouth daily.      . Flaxseed, Linseed, (FLAX SEED OIL) 1000 MG CAPS Take 1,200 mg by mouth daily.      . Garlic Oil [id number] MG TABS Take 1 tablet by mouth daily.      . metoprolol tartrate (LOPRESSOR) 25 MG tablet TAKE 1/2 TABLET BY MOUTH 2 TIMES DAILY 90 tablet 3  . nitroGLYCERIN  (NITROSTAT) 0.4 MG SL tablet Place 1 tablet (0.4 mg total) under the tongue every 5 (five) minutes x 3 doses as needed for chest pain. 25 tablet 3  . omega-3 acid ethyl esters (LOVAZA) 1 g capsule Take 1 g by mouth daily.    . Probiotic Product (ALIGN PO) Take 1 capsule by mouth daily.    . ranitidine (ZANTAC) 150 MG tablet TAKE 1 TABLET BY MOUTH 2 TIMES DAILY AS NEEDED FOR HEARTBURN 60 tablet 1  . Rhodiola 300 MG CAPS Take 1 capsule by mouth daily.       No current facility-administered medications on file prior to visit.     Allergies  Allergen Reactions  . Chlorpromazine Hcl     unknown  . Codeine     REACTION: causes vomiting  . Hydrocodone     REACTION: causes vomiing and nausea  . Hydrocodone-Acetaminophen Nausea And Vomiting  . Lovaza [Omega-3-Acid Ethyl Esters] Other (See Comments)    Loose stools with two capsules  . Penicillins     REACTION: causes rash  . Prochlorperazine Edisylate     unknown   . Promethazine Hcl     unknown  .  Sulfonamide Derivatives     unknown     Family History  Problem Relation Age of Onset  . Skin cancer Mother   . Allergy (severe) Father   . Renal Disease Paternal Grandfather     one removed   . Colon cancer Neg Hx     Social History   Social History  . Marital status: Married    Spouse name: N/A  . Number of children: 2  . Years of education: N/A   Occupational History  . housewife/ student    Social History Main Topics  . Smoking status: Former Smoker    Packs/day: 2.00    Years: 20.00    Types: Cigarettes    Quit date: 06/19/2006  . Smokeless tobacco: Never Used  . Alcohol use 0.0 oz/week     Comment: maybe 2 a month   . Drug use: No  . Sexual activity: Not Asked   Other Topics Concern  . None   Social History Narrative   Resides in [place] with her husband.    Husband works in [place] and comes home two to three times a year   2 children who go to [place]   Just got accepted to [employer]   Exercise- NO     Review of Systems - See HPI.  All other ROS are negative.  BP 102/68 (BP Location: Left Arm, Patient Position: Sitting, Cuff Size: Large)   Pulse 60   Temp 98 F (36.7 C) (Oral)   Resp 16   Ht 5\' 1"  (1.549 m)   Wt 157 lb (71.2 kg)   SpO2 98%   BMI 29.66 kg/m   Physical Exam  Constitutional: She is oriented to person, place, and time.  Cardiovascular: Normal rate, regular rhythm, normal heart sounds and intact distal pulses.   Pulmonary/Chest: Effort normal and breath sounds normal.  Neurological: She is alert and oriented to person, place, and time.  Skin: Skin is warm and dry.     Vitals reviewed.   Recent Results (from the past 2160 hour(s))  Lipid Profile     Status: Abnormal   Collection Time: 01/18/16 11:09 AM  Result Value Ref Range   Cholesterol 170 125 - 200 mg/dL   Triglycerides 165 (H) <150 mg/dL   HDL 52 >=46 mg/dL   Total CHOL/HDL Ratio 3.3 <=5.0 Ratio   VLDL 33 (H) <30 mg/dL   LDL Cholesterol 85 <130 mg/dL    Comment:   Total Cholesterol/HDL Ratio:CHD Risk                        Coronary Heart Disease Risk Table                                        Men       Women          1/2 Average Risk              3.4        3.3              Average Risk              5.0        4.4           2X Average Risk              9.6  7.1           3X Average Risk             23.4       11.0 Use the calculated Patient Ratio above and the CHD Risk table  to determine the patient's CHD Risk.   Hepatic function panel     Status: None   Collection Time: 01/18/16 11:09 AM  Result Value Ref Range   Total Bilirubin 0.8 0.2 - 1.2 mg/dL   Bilirubin, Direct 0.2 <=0.2 mg/dL   Indirect Bilirubin 0.6 0.2 - 1.2 mg/dL   Alkaline Phosphatase 80 33 - 130 U/L   AST 23 10 - 35 U/L   ALT 25 6 - 29 U/L   Total Protein 6.4 6.1 - 8.1 g/dL   Albumin 4.3 3.6 - 5.1 g/dL  HgB A1c     Status: Abnormal   Collection Time: 01/18/16 11:09 AM  Result Value Ref Range   Hgb A1c MFr Bld  6.1 (H) <5.7 %    Comment:   For someone without known diabetes, a hemoglobin A1c value between 5.7% and 6.4% is consistent with prediabetes and should be confirmed with a follow-up test.   For someone with known diabetes, a value <7% indicates that their diabetes is well controlled. A1c targets should be individualized based on duration of diabetes, age, co-morbid conditions and other considerations.   This assay result is consistent with an increased risk of diabetes.   Currently, no consensus exists regarding use of hemoglobin A1c for diagnosis of diabetes in children.      Mean Plasma Glucose 128 mg/dL    Assessment/Plan: 1. Herpes zoster without complication Healing well. No active lesions. Continue supportive measures. Keep covered. Nothing further needed at present. Everything should continue to resolve.   Leeanne Rio, PA-C

## 2016-04-30 ENCOUNTER — Other Ambulatory Visit: Payer: Self-pay | Admitting: Gastroenterology

## 2016-05-02 ENCOUNTER — Other Ambulatory Visit: Payer: Self-pay | Admitting: Gastroenterology

## 2016-05-03 NOTE — Telephone Encounter (Signed)
Dr Havery Moros, I made an appt for this pt 06/30/15. She has requested a refill until the appt. Will you approve? Thanks.

## 2016-05-04 ENCOUNTER — Other Ambulatory Visit: Payer: Self-pay

## 2016-05-04 MED ORDER — RANITIDINE HCL 150 MG PO TABS: 150.0000 mg | ORAL_TABLET | Freq: Two times a day (BID) | ORAL | 3 refills | Status: DC

## 2016-05-04 NOTE — Telephone Encounter (Signed)
Yes that's fine. Thanks  

## 2016-05-04 NOTE — Telephone Encounter (Signed)
RX refilled as directed by Dr Havery Moros. Zantac 150mg  BID #60 with 3 refills

## 2016-06-16 ENCOUNTER — Encounter: Payer: Self-pay | Admitting: Nurse Practitioner

## 2016-06-16 ENCOUNTER — Ambulatory Visit (INDEPENDENT_AMBULATORY_CARE_PROVIDER_SITE_OTHER): Payer: Medicare Other | Admitting: Nurse Practitioner

## 2016-06-16 VITALS — BP 110/62 | HR 80 | Temp 98.8°F | Wt 152.0 lb

## 2016-06-16 DIAGNOSIS — I2510 Atherosclerotic heart disease of native coronary artery without angina pectoris: Secondary | ICD-10-CM

## 2016-06-16 DIAGNOSIS — J069 Acute upper respiratory infection, unspecified: Secondary | ICD-10-CM

## 2016-06-16 DIAGNOSIS — J209 Acute bronchitis, unspecified: Secondary | ICD-10-CM

## 2016-06-16 MED ORDER — GUAIFENESIN-DM 100-10 MG/5ML PO SYRP: 5.0000 mL | ORAL_SOLUTION | ORAL | 0 refills | Status: DC | PRN

## 2016-06-16 MED ORDER — IPRATROPIUM BROMIDE 0.03 % NA SOLN: 2.0000 | Freq: Two times a day (BID) | NASAL | 0 refills | Status: DC

## 2016-06-16 MED ORDER — ALBUTEROL SULFATE HFA 108 (90 BASE) MCG/ACT IN AERS: 2.0000 | INHALATION_SPRAY | Freq: Four times a day (QID) | RESPIRATORY_TRACT | 0 refills | Status: DC | PRN

## 2016-06-16 MED ORDER — BENZONATATE 100 MG PO CAPS: 100.0000 mg | ORAL_CAPSULE | Freq: Three times a day (TID) | ORAL | 0 refills | Status: DC | PRN

## 2016-06-16 NOTE — Patient Instructions (Addendum)
Unable to prescribe hycodan or promethazine DM or benzonatate due to allergy list.  Advised patient that her symptoms were caused by viral infection, hence no need for oral abx at this time. I recommend treating symptoms with mucinex, atrovent nasal spray and albuterol. She was unhappy and stated she easily develops bronchitis and does not what to end up with pneumonia. I offered to send patient for CXR, but she refused. She also stated if she has to come back for oral abx she will not pay for an office visit.   URI Instructions: Encourage adequate oral hydration.  Use over-the-counter  "cold" medicines  such as "Tylenol cold" , "Advil cold",  "Mucinex" or" Mucinex D"  for cough and congestion.  Avoid decongestants if you have high blood pressure. Use" Delsym" or" Robitussin" cough syrup varietis for cough.  You can use plain "Tylenol" or "Advi"l for fever, chills and achyness.   "Common cold" symptoms are usually triggered by a virus.  The antibiotics are usually not necessary. On average, a" viral cold" illness would take 4-10 days to resolve. Please, make an appointment if you are not better or if you're worse.

## 2016-06-16 NOTE — Progress Notes (Signed)
Subjective:  Patient ID: Emily [name], female    DOB: Aug 02, 1950  Age: 65 y.o. MRN: [id number]  CC: Cough (x 4days)  Cough  This is a new problem. Episode onset: 4days ago. The problem has been unchanged. The problem occurs constantly. The cough is non-productive. Associated symptoms include headaches, nasal congestion, postnasal drip, rhinorrhea and a sore throat. Pertinent negatives include no chest pain, chills, ear congestion, ear pain, fever, heartburn, hemoptysis, myalgias, shortness of breath, weight loss or wheezing. The symptoms are aggravated by lying down. She has tried rest for the symptoms. Her past medical history is significant for bronchitis.  she is requesting for oral abx  Outpatient Medications Prior to Visit  Medication Sig Dispense Refill  . Acetylcarnitine HCl (ACETYL L-CARNITINE) 250 MG CAPS Take by mouth. 200 mg every other day    . ALPRAZolam (XANAX) 0.25 MG tablet Take 1-1.5 tablets by mouth as needed at bedtime 45 tablet 1  . atorvastatin (LIPITOR) 80 MG tablet Take 1 tablet (80 mg total) by mouth daily. 90 tablet 3  . b complex vitamins tablet Take 1 tablet by mouth daily.      . Biotin 300 MCG TABS Take 600 mcg by mouth every other day.      . Calcium-Magnesium-Zinc 500-250-12.5 MG TABS Take 1 tablet by mouth every other day.      . Chromium Picolinate 200 MCG TABS Take 1 tablet by mouth every other day.      . clobetasol cream (TEMOVATE) 0.05 % APPLY A THIN LAYER TOPICALLY TO AFFECTED AREA(S) DAILY  0  . clopidogrel (PLAVIX) 75 MG tablet Take 1 tablet (75 mg total) by mouth daily. 90 tablet 3  . Coenzyme Q10 (COQ10) 200 MG CAPS Take 1 capsule by mouth daily.      Marland Kitchen CRANBERRY JUICE EXTRACT PO Take 500 mg by mouth daily.      . Flaxseed, Linseed, (FLAX SEED OIL) 1000 MG CAPS Take 1,200 mg by mouth daily.      . Garlic Oil [id number] MG TABS Take 1 tablet by mouth daily.      . metoprolol tartrate (LOPRESSOR) 25 MG tablet TAKE 1/2 TABLET BY MOUTH 2 TIMES DAILY 90  tablet 3  . nitroGLYCERIN (NITROSTAT) 0.4 MG SL tablet Place 1 tablet (0.4 mg total) under the tongue every 5 (five) minutes x 3 doses as needed for chest pain. 25 tablet 3  . omega-3 acid ethyl esters (LOVAZA) 1 g capsule Take 1 g by mouth daily.    . Probiotic Product (ALIGN PO) Take 1 capsule by mouth daily.    . ranitidine (ZANTAC) 150 MG tablet Take 1 tablet (150 mg total) by mouth 2 (two) times daily. 60 tablet 3  . Rhodiola 300 MG CAPS Take 1 capsule by mouth daily.      Marland Kitchen albuterol (PROVENTIL HFA;VENTOLIN HFA) 108 (90 BASE) MCG/ACT inhaler Inhale 2 puffs into the lungs every 6 (six) hours as needed for wheezing or shortness of breath. 1 Inhaler 0   No facility-administered medications prior to visit.     ROS See HPI  Objective:  BP 110/62   Pulse 80   Temp 98.8 F (37.1 C)   Wt 152 lb (68.9 kg)   SpO2 98%   BMI 28.72 kg/m   BP Readings from Last 3 Encounters:  06/16/16 110/62  04/07/16 102/68  04/04/16 118/70    Wt Readings from Last 3 Encounters:  06/16/16 152 lb (68.9 kg)  04/07/16 157 lb (71.2 kg)  04/04/16 158 lb 2 oz (71.7 kg)    Physical Exam  Constitutional: She is oriented to person, place, and time. No distress.  HENT:  Right Ear: Tympanic membrane, external ear and ear canal normal.  Left Ear: Tympanic membrane, external ear and ear canal normal.  Nose: Mucosal edema and rhinorrhea present. Right sinus exhibits no maxillary sinus tenderness and no frontal sinus tenderness. Left sinus exhibits no maxillary sinus tenderness and no frontal sinus tenderness.  Mouth/Throat: Uvula is midline. No trismus in the jaw. Posterior oropharyngeal erythema present. No oropharyngeal exudate.  Eyes: No scleral icterus.  Neck: Normal range of motion. Neck supple.  Cardiovascular: Normal rate and regular rhythm.   Pulmonary/Chest: Effort normal and breath sounds normal.  Musculoskeletal: She exhibits no edema.  Lymphadenopathy:    She has no cervical adenopathy.    Neurological: She is alert and oriented to person, place, and time.  Vitals reviewed.   Lab Results  Component Value Date   WBC 8.4 08/11/2015   HGB 15.1 (H) 08/11/2015   HCT 45.1 08/11/2015   PLT 285.0 08/11/2015   GLUCOSE 102 (H) 08/11/2015   CHOL 170 01/18/2016   TRIG 165 (H) 01/18/2016   HDL 52 01/18/2016   LDLDIRECT 123.0 08/11/2015   LDLCALC 85 01/18/2016   ALT 25 01/18/2016   AST 23 01/18/2016   NA 139 08/11/2015   K 4.4 08/11/2015   CL 101 08/11/2015   CREATININE 0.65 08/11/2015   BUN 16 08/11/2015   CO2 31 08/11/2015   TSH 1.60 07/17/2014   INR 0.9 07/18/2007   HGBA1C 6.1 (H) 01/18/2016    Dg Chest 2 View  Result Date: 07/24/2014 CLINICAL DATA:  65 year old female with 1 month history of fever. Prior history includes smoking and heart disease EXAM: CHEST  2 VIEW COMPARISON:  Prior chest x-ray 06/10/2014. FINDINGS: Cardiac and mediastinal contours remain unchanged within normal limits. Trace atherosclerotic calcification in the transverse aorta. No evidence of focal airspace consolidation, pulmonary edema, pleural effusion or pneumothorax. Mild central bronchitic changes remain unchanged. No acute osseous abnormality. IMPRESSION: No active cardiopulmonary disease. Electronically Signed   By: Jacqulynn Cadet M.D.   On: 07/24/2014 14:25    Assessment & Plan:   Emily [name] was seen today for cough.  Diagnoses and all orders for this visit:  Acute URI -     guaiFENesin-dextromethorphan (ROBITUSSIN DM) 100-10 MG/5ML syrup; Take 5 mLs by mouth every 4 (four) hours as needed for cough. -     ipratropium (ATROVENT) 0.03 % nasal spray; Place 2 sprays into both nostrils 2 (two) times daily. Do not use for more than 5days. -     Discontinue: benzonatate (TESSALON) 100 MG capsule; Take 1 capsule (100 mg total) by mouth 3 (three) times daily as needed for cough.  Acute bronchitis, unspecified organism -     guaiFENesin-dextromethorphan (ROBITUSSIN DM) 100-10 MG/5ML syrup; Take  5 mLs by mouth every 4 (four) hours as needed for cough. -     ipratropium (ATROVENT) 0.03 % nasal spray; Place 2 sprays into both nostrils 2 (two) times daily. Do not use for more than 5days. -     Discontinue: benzonatate (TESSALON) 100 MG capsule; Take 1 capsule (100 mg total) by mouth 3 (three) times daily as needed for cough. -     albuterol (PROVENTIL HFA;VENTOLIN HFA) 108 (90 Base) MCG/ACT inhaler; Inhale 2 puffs into the lungs every 6 (six) hours as needed for wheezing or shortness of breath.   I have discontinued Ms.  Gabbert's benzonatate. I am also having her start on guaiFENesin-dextromethorphan and ipratropium. Additionally, I am having her maintain her Acetyl L-Carnitine, Biotin, Calcium-Magnesium-Zinc, Chromium Picolinate, CoQ10, CRANBERRY JUICE EXTRACT PO, Garlic Oil, Rhodiola, Flax Seed Oil, b complex vitamins, clobetasol cream, Probiotic Product (ALIGN PO), atorvastatin, clopidogrel, metoprolol tartrate, nitroGLYCERIN, omega-3 acid ethyl esters, ALPRAZolam, ranitidine, and albuterol.  Meds ordered this encounter  Medications  . guaiFENesin-dextromethorphan (ROBITUSSIN DM) 100-10 MG/5ML syrup    Sig: Take 5 mLs by mouth every 4 (four) hours as needed for cough.    Dispense:  118 mL    Refill:  0    Order Specific Question:   Supervising Provider    Answer:   Cassandria Anger [[id number]]  . ipratropium (ATROVENT) 0.03 % nasal spray    Sig: Place 2 sprays into both nostrils 2 (two) times daily. Do not use for more than 5days.    Dispense:  30 mL    Refill:  0    Order Specific Question:   Supervising Provider    Answer:   Cassandria Anger [[id number]]  . DISCONTD: benzonatate (TESSALON) 100 MG capsule    Sig: Take 1 capsule (100 mg total) by mouth 3 (three) times daily as needed for cough.    Dispense:  20 capsule    Refill:  0    Order Specific Question:   Supervising Provider    Answer:   Cassandria Anger [[id number]]  . albuterol (PROVENTIL HFA;VENTOLIN HFA) 108 (90 Base)  MCG/ACT inhaler    Sig: Inhale 2 puffs into the lungs every 6 (six) hours as needed for wheezing or shortness of breath.    Dispense:  1 Inhaler    Refill:  0    Order Specific Question:   Supervising Provider    Answer:   Cassandria Anger [[id number]]    Follow-up: No Follow-up on file.  Wilfred Lacy, NP

## 2016-06-16 NOTE — Progress Notes (Signed)
Pre visit review using our clinic review tool, if applicable. No additional management support is needed unless otherwise documented below in the visit note. 

## 2016-06-21 ENCOUNTER — Encounter: Payer: Self-pay | Admitting: Physician Assistant

## 2016-06-21 ENCOUNTER — Ambulatory Visit (INDEPENDENT_AMBULATORY_CARE_PROVIDER_SITE_OTHER): Payer: Medicare Other | Admitting: Physician Assistant

## 2016-06-21 VITALS — BP 128/82 | HR 55 | Temp 98.0°F | Resp 16 | Ht 61.0 in | Wt 152.0 lb

## 2016-06-21 DIAGNOSIS — J209 Acute bronchitis, unspecified: Secondary | ICD-10-CM | POA: Diagnosis not present

## 2016-06-21 MED ORDER — DOXYCYCLINE HYCLATE 100 MG PO CAPS: 100.0000 mg | ORAL_CAPSULE | Freq: Two times a day (BID) | ORAL | 0 refills | Status: DC

## 2016-06-21 NOTE — Progress Notes (Signed)
Pre visit review using our clinic review tool, if applicable. No additional management support is needed unless otherwise documented below in the visit note. 

## 2016-06-21 NOTE — Progress Notes (Signed)
Patient presents to clinic today c/o 10 days of worsening cough that was initially dry but now is associated with chest congestion and cough productive of clear sputum. Endorses fever at home with Tmax up to 101.8 initially. No fever today per patient. Denies chest pain but notes chest tightness and wheezing. Patient was seen by another provider last Friday and was diagnosed with viral URI and started on cough syrup and nasal steroid. Endorses that cough has worsened since that time.   Past Medical History:  Diagnosis Date  . Anxiety   . CAD (coronary artery disease)   . Chronic insomnia   . Colon polyp   . Depression   . Heart attack 1-09  . Hyperlipemia   . Hypertension   . Perirectal abscess     Current Outpatient Prescriptions on File Prior to Visit  Medication Sig Dispense Refill  . Acetylcarnitine HCl (ACETYL L-CARNITINE) 250 MG CAPS Take by mouth. 200 mg every other day    . albuterol (PROVENTIL HFA;VENTOLIN HFA) 108 (90 Base) MCG/ACT inhaler Inhale 2 puffs into the lungs every 6 (six) hours as needed for wheezing or shortness of breath. 1 Inhaler 0  . ALPRAZolam (XANAX) 0.25 MG tablet Take 1-1.5 tablets by mouth as needed at bedtime 45 tablet 1  . atorvastatin (LIPITOR) 80 MG tablet Take 1 tablet (80 mg total) by mouth daily. 90 tablet 3  . b complex vitamins tablet Take 1 tablet by mouth daily.      . Biotin 300 MCG TABS Take 600 mcg by mouth every other day.      . Calcium-Magnesium-Zinc 500-250-12.5 MG TABS Take 1 tablet by mouth every other day.      . Chromium Picolinate 200 MCG TABS Take 1 tablet by mouth every other day.      . clobetasol cream (TEMOVATE) 0.05 % APPLY A THIN LAYER TOPICALLY TO AFFECTED AREA(S) DAILY  0  . clopidogrel (PLAVIX) 75 MG tablet Take 1 tablet (75 mg total) by mouth daily. 90 tablet 3  . Coenzyme Q10 (COQ10) 200 MG CAPS Take 1 capsule by mouth daily.      Marland Kitchen CRANBERRY JUICE EXTRACT PO Take 500 mg by mouth daily.      . Flaxseed, Linseed, (FLAX  SEED OIL) 1000 MG CAPS Take 1,200 mg by mouth daily.      . Garlic Oil [id number] MG TABS Take 1 tablet by mouth daily.      Marland Kitchen guaiFENesin-dextromethorphan (ROBITUSSIN DM) 100-10 MG/5ML syrup Take 5 mLs by mouth every 4 (four) hours as needed for cough. 118 mL 0  . ipratropium (ATROVENT) 0.03 % nasal spray Place 2 sprays into both nostrils 2 (two) times daily. Do not use for more than 5days. 30 mL 0  . metoprolol tartrate (LOPRESSOR) 25 MG tablet TAKE 1/2 TABLET BY MOUTH 2 TIMES DAILY 90 tablet 3  . nitroGLYCERIN (NITROSTAT) 0.4 MG SL tablet Place 1 tablet (0.4 mg total) under the tongue every 5 (five) minutes x 3 doses as needed for chest pain. 25 tablet 3  . omega-3 acid ethyl esters (LOVAZA) 1 g capsule Take 1 g by mouth daily.    . Probiotic Product (ALIGN PO) Take 1 capsule by mouth daily.    . ranitidine (ZANTAC) 150 MG tablet Take 1 tablet (150 mg total) by mouth 2 (two) times daily. 60 tablet 3  . Rhodiola 300 MG CAPS Take 1 capsule by mouth daily.       No current facility-administered medications on file  prior to visit.     Allergies  Allergen Reactions  . Benzonatate     Unknown, patient states "it made me sick"  . Chlorpromazine Hcl     unknown  . Codeine     REACTION: causes vomiting  . Hydrocodone     REACTION: causes vomiing and nausea  . Hydrocodone-Acetaminophen Nausea And Vomiting  . Lovaza [Omega-3-Acid Ethyl Esters] Other (See Comments)    Loose stools with two capsules  . Penicillins     REACTION: causes rash  . Prochlorperazine Edisylate     unknown   . Promethazine Hcl     unknown  . Sulfonamide Derivatives     unknown     Family History  Problem Relation Age of Onset  . Skin cancer Mother   . Allergy (severe) Father   . Renal Disease Paternal Grandfather     one removed   . Colon cancer Neg Hx     Social History   Social History  . Marital status: Married    Spouse name: N/A  . Number of children: 2  . Years of education: N/A   Occupational  History  . housewife/ student    Social History Main Topics  . Smoking status: Former Smoker    Packs/day: 2.00    Years: 20.00    Types: Cigarettes    Quit date: 06/19/2006  . Smokeless tobacco: Never Used  . Alcohol use 0.0 oz/week     Comment: maybe 2 a month   . Drug use: No  . Sexual activity: Not Asked   Other Topics Concern  . None   Social History Narrative   Resides in [place] with her husband.    Husband works in [place] and comes home two to three times a year   2 children who go to [place]   Just got accepted to [employer]   Exercise- NO   Review of Systems - See HPI.  All other ROS are negative.  BP 128/82   Pulse (!) 55   Temp 98 F (36.7 C) (Oral)   Resp 16   Ht 5\' 1"  (1.549 m)   Wt 152 lb (68.9 kg)   SpO2 98%   BMI 28.72 kg/m   Physical Exam  Constitutional: She is oriented to person, place, and time and well-developed, well-nourished, and in no distress.  HENT:  Head: Normocephalic and atraumatic.  Right Ear: External ear normal.  Left Ear: External ear normal.  Nose: Nose normal.  Mouth/Throat: Oropharynx is clear and moist. No oropharyngeal exudate.  Eyes: Conjunctivae are normal.  Neck: Neck supple.  Cardiovascular: Normal rate, regular rhythm, normal heart sounds and intact distal pulses.   Pulmonary/Chest: Effort normal. No respiratory distress. She has wheezes. She has no rales. She exhibits no tenderness.  Neurological: She is alert and oriented to person, place, and time.  Skin: Skin is warm and dry. No rash noted.  Psychiatric: Affect normal.  Vitals reviewed.  Assessment/Plan: 1. Acute bronchitis, unspecified organism Discussed with patient that I feel that initial assessment and treatment was completely appropriate giving good examination, short duration of symptoms and husband with similar symptoms. Most acute bronchitis/URI are in fact viral. However giving worsening symptoms 10 days out, there is some concern for bacterial etiology. Mild  wheeze noted. Patient given albuterol neb with improvement. She has been encouraged to use albuterol inhaler as directed. Continue OTC cough medication. Will add on Doxycycline today. FU if not resolving.   Leeanne Rio, PA-C

## 2016-06-21 NOTE — Patient Instructions (Signed)
Take antibiotic (Doxycycline) as directed.  Increase fluids.  Get plenty of rest. Use Mucinex for congestion. Continue albuterol inhaler as directed. Take a daily probiotic (I recommend Align or Culturelle, but even Activia Yogurt may be beneficial).  A humidifier placed in the bedroom may offer some relief for a dry, scratchy throat of nasal irritation.  Read information below on acute bronchitis. Please call or return to clinic if symptoms are not improving.  Acute Bronchitis Bronchitis is when the airways that extend from the windpipe into the lungs get red, puffy, and painful (inflamed). Bronchitis often causes thick spit (mucus) to develop. This leads to a cough. A cough is the most common symptom of bronchitis. In acute bronchitis, the condition usually begins suddenly and goes away over time (usually in 2 weeks). Smoking, allergies, and asthma can make bronchitis worse. Repeated episodes of bronchitis may cause more lung problems.  HOME CARE  Rest.  Drink enough fluids to keep your pee (urine) clear or pale yellow (unless you need to limit fluids as told by your doctor).  Only take over-the-counter or prescription medicines as told by your doctor.  Avoid smoking and secondhand smoke. These can make bronchitis worse. If you are a smoker, think about using nicotine gum or skin patches. Quitting smoking will help your lungs heal faster.  Reduce the chance of getting bronchitis again by:  Washing your hands often.  Avoiding people with cold symptoms.  Trying not to touch your hands to your mouth, nose, or eyes.  Follow up with your doctor as told.  GET HELP IF: Your symptoms do not improve after 1 week of treatment. Symptoms include:  Cough.  Fever.  Coughing up thick spit.  Body aches.  Chest congestion.  Chills.  Shortness of breath.  Sore throat.  GET HELP RIGHT AWAY IF:   You have an increased fever.  You have chills.  You have severe shortness of  breath.  You have bloody thick spit (sputum).  You throw up (vomit) often.  You lose too much body fluid (dehydration).  You have a severe headache.  You faint.  MAKE SURE YOU:   Understand these instructions.  Will watch your condition.  Will get help right away if you are not doing well or get worse. Document Released: 11/22/2007 Document Revised: 02/05/2013 Document Reviewed: 11/26/2012 [facility] Patient Information 2015 [place], [place]. This information is not intended to replace advice given to you by your health care provider. Make sure you discuss any questions you have with your health care provider.

## 2016-06-23 ENCOUNTER — Telehealth: Payer: Self-pay | Admitting: Physician Assistant

## 2016-06-23 MED ORDER — AZITHROMYCIN 250 MG PO TABS: ORAL_TABLET | ORAL | 0 refills | Status: DC

## 2016-06-23 NOTE — Telephone Encounter (Signed)
Discussed side effects with patient -- nausea and loose stools. Stopped Doxycycline with resolution of GI symptoms. Will send in Azithromycin. She will FU via phone on Monday. She has been instructed to start Probiotic.

## 2016-06-23 NOTE — Telephone Encounter (Signed)
Pt states that she is unable to take doxycycline it upsets her stomach and asking if something could be called in, [facility] [address].

## 2016-06-29 ENCOUNTER — Encounter: Payer: Self-pay | Admitting: Gastroenterology

## 2016-06-29 ENCOUNTER — Ambulatory Visit (INDEPENDENT_AMBULATORY_CARE_PROVIDER_SITE_OTHER): Payer: Medicare Other | Admitting: Gastroenterology

## 2016-06-29 VITALS — BP 120/60 | HR 64 | Ht 61.0 in | Wt 152.4 lb

## 2016-06-29 DIAGNOSIS — D126 Benign neoplasm of colon, unspecified: Secondary | ICD-10-CM | POA: Diagnosis not present

## 2016-06-29 DIAGNOSIS — K2100 Gastro-esophageal reflux disease with esophagitis, without bleeding: Secondary | ICD-10-CM

## 2016-06-29 MED ORDER — RANITIDINE HCL 150 MG PO TABS: 150.0000 mg | ORAL_TABLET | Freq: Two times a day (BID) | ORAL | 3 refills | Status: DC

## 2016-06-29 NOTE — Patient Instructions (Addendum)
If you are age 66 or older, your body mass index should be between 23-30. Your Body mass index is 28.8 kg/m. If this is out of the aforementioned range listed, please consider follow up with your Primary Care Provider.  If you are age 39 or younger, your body mass index should be between 19-25. Your Body mass index is 28.8 kg/m. If this is out of the aformentioned range listed, please consider follow up with your Primary Care Provider.   We have sent the following medications to your pharmacy for you to pick up at your convenience:  Zantac  Please purchase the over the counter Gaviscon and use as needed.  You will be contacted when it is time to schedule your colonoscopy that is due in 2020.  Thank you.

## 2016-06-29 NOTE — Progress Notes (Signed)
HPI :  66 y/o female here for a follow up visit for GERD. See prior intake note for full history. She previously had significant symptoms of reflux using TUMS PRN. She underwent EGD on Feb 10th 2017.   EGD showed Grade B esophagitis without Barrett's and multiple squamous papillomas. Recommended a trial of omeprazole for this issue. She has some trigger foods which can precipitate her reflux, tomatoe based foods. She took omeprazole for a period of time since the last visit, she does not recall how she felt on it. She eventually transition to taking zantac twice daily after a course of omeprazole due to her concern about long term side effects of PPIs. Overall it seems to be working most of the time and treating her symptoms, she has good control of her symptoms "99%" of the time. No dysphagia. No weight loss, she is trying to lose weight. No FH of esophageal cancer  EGD 07/30/2015 - grade B esophagitis, squamous papillomas, normal stomach / duodenum. Biopsies showed no evidence of Barrett's.  Colonoscopy 07/30/2015 - showed 6 small polyps removed, adneomas / serrated polyps, due for recall in 3 years.  Past Medical History:  Diagnosis Date  . Anxiety   . CAD (coronary artery disease)   . Chronic insomnia   . Colon polyp   . Depression   . GERD with esophagitis   . Heart attack 1-09  . Hyperlipemia   . Hypertension   . Perirectal abscess      Past Surgical History:  Procedure Laterality Date  . CESAREAN SECTION     x 2  . COLONOSCOPY W/ POLYPECTOMY  2007  . INCISION AND DRAINAGE ABSCESS ANAL    . PTCA with stent to third obtuse marginal     Family History  Problem Relation Age of Onset  . Skin cancer Mother   . Allergy (severe) Father   . Heart disease Father   . Leukemia Father     CML  . Renal Disease Paternal Grandfather     one removed   . Colon cancer Neg Hx    Social History  Substance Use Topics  . Smoking status: Former Smoker    Packs/day: 2.00    Years: 20.00     Types: Cigarettes    Quit date: 06/19/2006  . Smokeless tobacco: Never Used  . Alcohol use 0.0 oz/week     Comment: <2 monthly; rare   Current Outpatient Prescriptions  Medication Sig Dispense Refill  . Acetylcarnitine HCl (ACETYL L-CARNITINE) 250 MG CAPS Take by mouth. 200 mg every other day    . albuterol (PROVENTIL HFA;VENTOLIN HFA) 108 (90 Base) MCG/ACT inhaler Inhale 2 puffs into the lungs every 6 (six) hours as needed for wheezing or shortness of breath. 1 Inhaler 0  . ALPRAZolam (XANAX) 0.25 MG tablet Take 1-1.5 tablets by mouth as needed at bedtime 45 tablet 1  . atorvastatin (LIPITOR) 80 MG tablet Take 1 tablet (80 mg total) by mouth daily. 90 tablet 3  . azithromycin (ZITHROMAX) 250 MG tablet Take 2 tablets on Day 1. Then take 1 tablet daily. 6 tablet 0  . b complex vitamins tablet Take 1 tablet by mouth daily.      . Biotin 300 MCG TABS Take 600 mcg by mouth every other day.      . Calcium-Magnesium-Zinc 500-250-12.5 MG TABS Take 1 tablet by mouth every other day.      . Chromium Picolinate 200 MCG TABS Take 1 tablet by mouth every other  day.      . clobetasol cream (TEMOVATE) 0.05 % APPLY A THIN LAYER TOPICALLY TO AFFECTED AREA(S) DAILY  0  . clopidogrel (PLAVIX) 75 MG tablet Take 1 tablet (75 mg total) by mouth daily. 90 tablet 3  . Coenzyme Q10 (COQ10) 200 MG CAPS Take 1 capsule by mouth daily.      Marland Kitchen CRANBERRY JUICE EXTRACT PO Take 500 mg by mouth daily.      . Flaxseed, Linseed, (FLAX SEED OIL) 1000 MG CAPS Take 1,200 mg by mouth daily.      . Garlic Oil [id number] MG TABS Take 1 tablet by mouth daily.      Marland Kitchen guaiFENesin-dextromethorphan (ROBITUSSIN DM) 100-10 MG/5ML syrup Take 5 mLs by mouth every 4 (four) hours as needed for cough. 118 mL 0  . ipratropium (ATROVENT) 0.03 % nasal spray Place 2 sprays into both nostrils 2 (two) times daily. Do not use for more than 5days. 30 mL 0  . metoprolol tartrate (LOPRESSOR) 25 MG tablet TAKE 1/2 TABLET BY MOUTH 2 TIMES DAILY 90 tablet 3  .  nitroGLYCERIN (NITROSTAT) 0.4 MG SL tablet Place 1 tablet (0.4 mg total) under the tongue every 5 (five) minutes x 3 doses as needed for chest pain. 25 tablet 3  . omega-3 acid ethyl esters (LOVAZA) 1 g capsule Take 1 g by mouth daily.    . Probiotic Product (ALIGN PO) Take 1 capsule by mouth daily.    . ranitidine (ZANTAC) 150 MG tablet Take 1 tablet (150 mg total) by mouth 2 (two) times daily. 180 tablet 3  . Rhodiola 300 MG CAPS Take 1 capsule by mouth daily.       No current facility-administered medications for this visit.    Allergies  Allergen Reactions  . Benzonatate     Unknown, patient states "it made me sick"  . Chlorpromazine Hcl     unknown  . Codeine     REACTION: causes vomiting  . Doxycycline     "explosive diarrhea" -does not wish to take this anymore  . Hydrocodone     REACTION: causes vomiing and nausea  . Hydrocodone-Acetaminophen Nausea And Vomiting  . Lovaza [Omega-3-Acid Ethyl Esters] Other (See Comments)    Loose stools with two capsules  . Penicillins     REACTION: causes rash  . Prochlorperazine Edisylate     unknown   . Promethazine Hcl     unknown  . Sulfonamide Derivatives     unknown      Review of Systems: All systems reviewed and negative except where noted in HPI.    No results found.  Physical Exam: BP 120/60   Pulse 64   Ht 5\' 1"  (1.549 m)   Wt 152 lb 6.4 oz (69.1 kg)   BMI 28.80 kg/m  Constitutional: Pleasant,well-developed, female in no acute distress. HEENT: Normocephalic and atraumatic. Conjunctivae are normal. No scleral icterus. Neck supple.  Cardiovascular: Normal rate, regular rhythm.  Pulmonary/chest: Effort normal and breath sounds normal. No wheezing, rales or rhonchi. Abdominal: Soft, nondistended, nontender.  There are no masses palpable. No hepatomegaly. Extremities: no edema Lymphadenopathy: No cervical adenopathy noted. Neurological: Alert and oriented to person place and time. Skin: Skin is warm and dry. No  rashes noted. Psychiatric: Normal mood and affect. Behavior is normal.   ASSESSMENT AND PLAN: 66 year old female here for follow-up visit to discuss results of her recent endoscopies and her course since that time:  GERD / history of esophagitis - esophagitis was noted while  on no therapy at all, she was then placed on omeprazole after her endoscopy and over time transition to Zantac twice daily due to her concerns for long-term side effects of PPIs. On Zantac twice daily her symptoms are generally very well controlled, she has only mild breakthrough with dietary indiscretion. As such we'll continue Zantac for now. Her prior endoscopy showed no evidence of Barrett's esophagus although she had some mild esophagitis at time. We may consider repeating an endoscopy to ensure no evidence of Barrett's, I discussed this with her and she wished to hold off for now. For any mild breakthrough at that time she can use Gaviscon as needed. She can see me in year for reassessment or as needed  Colon adenomas - due for recall colonoscopy in February 2020  [place] Cellar, MD [facility] Pager [phone]

## 2016-07-28 DIAGNOSIS — N3020 Other chronic cystitis without hematuria: Secondary | ICD-10-CM | POA: Diagnosis not present

## 2016-09-25 DIAGNOSIS — N390 Urinary tract infection, site not specified: Secondary | ICD-10-CM | POA: Diagnosis not present

## 2016-11-29 DIAGNOSIS — Z6829 Body mass index (BMI) 29.0-29.9, adult: Secondary | ICD-10-CM | POA: Diagnosis not present

## 2016-11-29 DIAGNOSIS — L293 Anogenital pruritus, unspecified: Secondary | ICD-10-CM | POA: Diagnosis not present

## 2016-11-29 DIAGNOSIS — Z01419 Encounter for gynecological examination (general) (routine) without abnormal findings: Secondary | ICD-10-CM | POA: Diagnosis not present

## 2016-11-29 DIAGNOSIS — R350 Frequency of micturition: Secondary | ICD-10-CM | POA: Diagnosis not present

## 2016-11-29 DIAGNOSIS — Z1231 Encounter for screening mammogram for malignant neoplasm of breast: Secondary | ICD-10-CM | POA: Diagnosis not present

## 2016-11-29 DIAGNOSIS — N393 Stress incontinence (female) (male): Secondary | ICD-10-CM | POA: Diagnosis not present

## 2016-12-06 ENCOUNTER — Other Ambulatory Visit: Payer: Self-pay | Admitting: Cardiology

## 2016-12-09 ENCOUNTER — Other Ambulatory Visit: Payer: Self-pay | Admitting: Cardiology

## 2016-12-19 ENCOUNTER — Other Ambulatory Visit: Payer: Self-pay | Admitting: Cardiology

## 2016-12-19 DIAGNOSIS — N390 Urinary tract infection, site not specified: Secondary | ICD-10-CM | POA: Diagnosis not present

## 2017-01-22 DIAGNOSIS — N390 Urinary tract infection, site not specified: Secondary | ICD-10-CM | POA: Diagnosis not present

## 2017-02-05 ENCOUNTER — Ambulatory Visit (INDEPENDENT_AMBULATORY_CARE_PROVIDER_SITE_OTHER): Payer: Medicare Other | Admitting: Physician Assistant

## 2017-02-05 ENCOUNTER — Encounter: Payer: Self-pay | Admitting: Physician Assistant

## 2017-02-05 VITALS — BP 110/72 | HR 59 | Temp 98.3°F | Resp 14 | Ht 61.0 in | Wt 157.0 lb

## 2017-02-05 DIAGNOSIS — R0982 Postnasal drip: Secondary | ICD-10-CM | POA: Diagnosis not present

## 2017-02-05 NOTE — Progress Notes (Signed)
Pre visit review using our clinic review tool, if applicable. No additional management support is needed unless otherwise documented below in the visit note. 

## 2017-02-05 NOTE — Progress Notes (Signed)
Patient presents to clinic today c/o 4 weeks of intermittent nasal drainage with nasal congestion and R ear pressure/popping. Denies sinus pain or headache. Denies chest congestion or cough. Denies fever, chills, malaise or fatigue. Has used some saline nasal rinse over the counter to help with drainage. Denies recent travel or sick contact.   Past Medical History:  Diagnosis Date  . Anxiety   . CAD (coronary artery disease)   . Chronic insomnia   . Colon polyp   . Depression   . GERD with esophagitis   . Heart attack ([place]) 1-09  . Hyperlipemia   . Hypertension   . Perirectal abscess     Current Outpatient Prescriptions on File Prior to Visit  Medication Sig Dispense Refill  . Acetylcarnitine HCl (ACETYL L-CARNITINE) 250 MG CAPS Take by mouth. 200 mg every other day    . albuterol (PROVENTIL HFA;VENTOLIN HFA) 108 (90 Base) MCG/ACT inhaler Inhale 2 puffs into the lungs every 6 (six) hours as needed for wheezing or shortness of breath. 1 Inhaler 0  . ALPRAZolam (XANAX) 0.25 MG tablet Take 1-1.5 tablets by mouth as needed at bedtime 45 tablet 1  . atorvastatin (LIPITOR) 80 MG tablet take 1 tablet by mouth once daily 90 tablet 3  . b complex vitamins tablet Take 1 tablet by mouth daily.      . Biotin 300 MCG TABS Take 600 mcg by mouth every other day.      . Calcium-Magnesium-Zinc 500-250-12.5 MG TABS Take 1 tablet by mouth every other day.      . Chromium Picolinate 200 MCG TABS Take 1 tablet by mouth every other day.      . clobetasol cream (TEMOVATE) 0.05 % APPLY A THIN LAYER TOPICALLY TO AFFECTED AREA(S) DAILY  0  . clopidogrel (PLAVIX) 75 MG tablet Take 1 tablet (75 mg total) by mouth daily. Needs office visit 30 tablet 0  . Coenzyme Q10 (COQ10) 200 MG CAPS Take 1 capsule by mouth daily.      Marland Kitchen CRANBERRY JUICE EXTRACT PO Take 500 mg by mouth daily.      . Flaxseed, Linseed, (FLAX SEED OIL) 1000 MG CAPS Take 1,200 mg by mouth daily.      . Garlic Oil 706 MG TABS Take 1 tablet by  mouth daily.      Marland Kitchen guaiFENesin-dextromethorphan (ROBITUSSIN DM) 100-10 MG/5ML syrup Take 5 mLs by mouth every 4 (four) hours as needed for cough. 118 mL 0  . ipratropium (ATROVENT) 0.03 % nasal spray Place 2 sprays into both nostrils 2 (two) times daily. Do not use for more than 5days. 30 mL 0  . metoprolol tartrate (LOPRESSOR) 25 MG tablet Take 0.5 tablets (12.5 mg total) by mouth 2 (two) times daily. Needs office visit 30 tablet 1  . nitroGLYCERIN (NITROSTAT) 0.4 MG SL tablet Place 1 tablet (0.4 mg total) under the tongue every 5 (five) minutes x 3 doses as needed for chest pain. 25 tablet 3  . omega-3 acid ethyl esters (LOVAZA) 1 g capsule Take 1 g by mouth daily.    . Probiotic Product (ALIGN PO) Take 1 capsule by mouth daily.    . ranitidine (ZANTAC) 150 MG tablet Take 1 tablet (150 mg total) by mouth 2 (two) times daily. 180 tablet 3  . Rhodiola 300 MG CAPS Take 1 capsule by mouth daily.       No current facility-administered medications on file prior to visit.     Allergies  Allergen Reactions  .  Benzonatate     Unknown, patient states "it made me sick"  . Chlorpromazine Hcl     unknown  . Codeine     REACTION: causes vomiting  . Doxycycline     "explosive diarrhea" -does not wish to take this anymore  . Hydrocodone     REACTION: causes vomiing and nausea  . Hydrocodone-Acetaminophen Nausea And Vomiting  . Lovaza [Omega-3-Acid Ethyl Esters] Other (See Comments)    Loose stools with two capsules  . Penicillins     REACTION: causes rash  . Prochlorperazine Edisylate     unknown   . Promethazine Hcl     unknown  . Sulfonamide Derivatives     unknown     Family History  Problem Relation Age of Onset  . Skin cancer Mother   . Allergy (severe) Father   . Heart disease Father   . Leukemia Father        CML  . Renal Disease Paternal Grandfather        one removed   . Colon cancer Neg Hx     Social History   Social History  . Marital status: Married    Spouse  name: N/A  . Number of children: 2  . Years of education: N/A   Occupational History  . housewife/ student    Social History Main Topics  . Smoking status: Former Smoker    Packs/day: 2.00    Years: 20.00    Types: Cigarettes    Quit date: 06/19/2006  . Smokeless tobacco: Never Used  . Alcohol use 0.0 oz/week     Comment: <2 monthly; rare  . Drug use: No  . Sexual activity: Not Asked   Other Topics Concern  . None   Social History Narrative   Resides in [place] with her husband.    Husband works in [place] and comes home two to three times a year   2 children who go to [place]   Just got accepted to [employer]   Exercise- NO    Review of Systems - See HPI.  All other ROS are negative.  BP 110/72   Pulse (!) 59   Temp 98.3 F (36.8 C) (Oral)   Resp 14   Ht 5\' 1"  (1.549 m)   Wt 157 lb (71.2 kg)   SpO2 98%   BMI 29.66 kg/m   Physical Exam  Constitutional: She is oriented to person, place, and time and well-developed, well-nourished, and in no distress.  HENT:  Head: Normocephalic and atraumatic.  Nose: Rhinorrhea present. Right sinus exhibits no maxillary sinus tenderness and no frontal sinus tenderness. Left sinus exhibits no maxillary sinus tenderness and no frontal sinus tenderness.  Mouth/Throat: Uvula is midline, oropharynx is clear and moist and mucous membranes are normal.  Significant cerumen bilaterally.   After removal, ear canals and TM within normal limits bilaterally  Eyes: Conjunctivae are normal.  Neck: Neck supple.  Cardiovascular: Normal rate, regular rhythm, normal heart sounds and intact distal pulses.   Pulmonary/Chest: Effort normal and breath sounds normal. No respiratory distress. She has no wheezes. She has no rales. She exhibits no tenderness.  Neurological: She is alert and oriented to person, place, and time.  Skin: Skin is warm and dry. No rash noted.  Psychiatric: Affect normal.  Vitals reviewed.  Assessment/Plan: 1. Post-nasal  drip Secondary to allergic rhinitis. Supportive measures and OTC medications reviewed. Will have her start daily Xzyal. Recommended Flonase but patient declines. Follow-up if symptoms are not resolving.  Leeanne Rio, PA-C

## 2017-02-05 NOTE — Patient Instructions (Signed)
Stay well-hydrated and get plenty of rest. Start the over-the-counter Xyzal once daily. Use the Saline nasal twice daily.   Call if symptoms are not improving or new symptoms develop.  Allergic Rhinitis Allergic rhinitis is when the mucous membranes in the nose respond to allergens. Allergens are particles in the air that cause your body to have an allergic reaction. This causes you to release allergic antibodies. Through a chain of events, these eventually cause you to release histamine into the blood stream. Although meant to protect the body, it is this release of histamine that causes your discomfort, such as frequent sneezing, congestion, and an itchy, runny nose. What are the causes? Seasonal allergic rhinitis (hay fever) is caused by pollen allergens that may come from grasses, trees, and weeds. Year-round allergic rhinitis (perennial allergic rhinitis) is caused by allergens such as house dust mites, pet dander, and mold spores. What are the signs or symptoms?  Nasal stuffiness (congestion).  Itchy, runny nose with sneezing and tearing of the eyes. How is this diagnosed? Your health care provider can help you determine the allergen or allergens that trigger your symptoms. If you and your health care provider are unable to determine the allergen, skin or blood testing may be used. Your health care provider will diagnose your condition after taking your health history and performing a physical exam. Your health care provider may assess you for other related conditions, such as asthma, pink eye, or an ear infection. How is this treated? Allergic rhinitis does not have a cure, but it can be controlled by:  Medicines that block allergy symptoms. These may include allergy shots, nasal sprays, and oral antihistamines.  Avoiding the allergen.  Hay fever may often be treated with antihistamines in pill or nasal spray forms. Antihistamines block the effects of histamine. There are  over-the-counter medicines that may help with nasal congestion and swelling around the eyes. Check with your health care provider before taking or giving this medicine. If avoiding the allergen or the medicine prescribed do not work, there are many new medicines your health care provider can prescribe. Stronger medicine may be used if initial measures are ineffective. Desensitizing injections can be used if medicine and avoidance does not work. Desensitization is when a patient is given ongoing shots until the body becomes less sensitive to the allergen. Make sure you follow up with your health care provider if problems continue. Follow these instructions at home: It is not possible to completely avoid allergens, but you can reduce your symptoms by taking steps to limit your exposure to them. It helps to know exactly what you are allergic to so that you can avoid your specific triggers. Contact a health care provider if:  You have a fever.  You develop a cough that does not stop easily (persistent).  You have shortness of breath.  You start wheezing.  Symptoms interfere with normal daily activities. This information is not intended to replace advice given to you by your health care provider. Make sure you discuss any questions you have with your health care provider. Document Released: 02/28/2001 Document Revised: 02/04/2016 Document Reviewed: 02/10/2013 Elsevier Interactive Patient Education  2017 [employer].

## 2017-02-07 ENCOUNTER — Other Ambulatory Visit (HOSPITAL_COMMUNITY): Payer: Self-pay | Admitting: Cardiology

## 2017-02-07 MED ORDER — CLOPIDOGREL BISULFATE 75 MG PO TABS: 75.0000 mg | ORAL_TABLET | Freq: Every day | ORAL | 3 refills | Status: DC

## 2017-02-07 NOTE — Telephone Encounter (Signed)
Refill line request  

## 2017-03-09 ENCOUNTER — Telehealth (HOSPITAL_COMMUNITY): Payer: Self-pay | Admitting: *Deleted

## 2017-03-09 ENCOUNTER — Ambulatory Visit (HOSPITAL_COMMUNITY)
Admission: RE | Admit: 2017-03-09 | Discharge: 2017-03-09 | Disposition: A | Discharge status: Home / Self Care | Payer: Medicare Other | Source: Ambulatory Visit | Attending: Cardiology | Admitting: Cardiology

## 2017-03-09 ENCOUNTER — Encounter (HOSPITAL_COMMUNITY): Payer: Self-pay | Admitting: Cardiology

## 2017-03-09 VITALS — BP 130/66 | HR 60 | Wt 157.0 lb

## 2017-03-09 DIAGNOSIS — E785 Hyperlipidemia, unspecified: Secondary | ICD-10-CM

## 2017-03-09 DIAGNOSIS — Z79899 Other long term (current) drug therapy: Secondary | ICD-10-CM | POA: Insufficient documentation

## 2017-03-09 DIAGNOSIS — I351 Nonrheumatic aortic (valve) insufficiency: Secondary | ICD-10-CM | POA: Diagnosis not present

## 2017-03-09 DIAGNOSIS — I2510 Atherosclerotic heart disease of native coronary artery without angina pectoris: Secondary | ICD-10-CM | POA: Diagnosis not present

## 2017-03-09 DIAGNOSIS — I10 Essential (primary) hypertension: Secondary | ICD-10-CM | POA: Diagnosis not present

## 2017-03-09 DIAGNOSIS — I6529 Occlusion and stenosis of unspecified carotid artery: Secondary | ICD-10-CM | POA: Diagnosis not present

## 2017-03-09 DIAGNOSIS — R011 Cardiac murmur, unspecified: Secondary | ICD-10-CM | POA: Diagnosis not present

## 2017-03-09 LAB — COMPREHENSIVE METABOLIC PANEL
ALBUMIN: 3.9 g/dL (ref 3.5–5.0)
ALK PHOS: 86 U/L (ref 38–126)
ALT: 20 U/L (ref 14–54)
AST: 22 U/L (ref 15–41)
Anion gap: 7 (ref 5–15)
BILIRUBIN TOTAL: 0.9 mg/dL (ref 0.3–1.2)
BUN: 10 mg/dL (ref 6–20)
CO2: 29 mmol/L (ref 22–32)
Calcium: 9.2 mg/dL (ref 8.9–10.3)
Chloride: 104 mmol/L (ref 101–111)
Creatinine, Ser: 0.66 mg/dL (ref 0.44–1.00)
GFR calc Af Amer: >60 mL/min (ref >60)
GFR calc non Af Amer: >60 mL/min (ref >60)
GLUCOSE: 115 mg/dL — AB (ref 65–99)
POTASSIUM: 3.4 mmol/L — AB (ref 3.5–5.1)
SODIUM: 140 mmol/L (ref 135–145)
TOTAL PROTEIN: 6.6 g/dL (ref 6.5–8.1)

## 2017-03-09 LAB — CBC
HEMATOCRIT: 43.5 % (ref 36.0–46.0)
HEMOGLOBIN: 14.3 g/dL (ref 12.0–15.0)
MCH: 28.9 pg (ref 26.0–34.0)
MCHC: 32.9 g/dL (ref 30.0–36.0)
MCV: 88.1 fL (ref 78.0–100.0)
Platelets: 254 10*3/uL (ref 150–400)
RBC: 4.94 MIL/uL (ref 3.87–5.11)
RDW: 13.5 % (ref 11.5–15.5)
WBC: 6.6 10*3/uL (ref 4.0–10.5)

## 2017-03-09 LAB — LIPID PANEL
Cholesterol: 188 mg/dL (ref 0–200)
HDL: 49 mg/dL (ref >40)
LDL CALC: 101 mg/dL — AB (ref 0–99)
TRIGLYCERIDES: 188 mg/dL — AB (ref <150)
Total CHOL/HDL Ratio: 3.8 RATIO
VLDL: 38 mg/dL (ref 0–40)

## 2017-03-09 MED ORDER — ATORVASTATIN CALCIUM 80 MG PO TABS: 80.0000 mg | ORAL_TABLET | Freq: Every day | ORAL | 3 refills | Status: DC

## 2017-03-09 MED ORDER — METOPROLOL TARTRATE 25 MG PO TABS: 12.5000 mg | ORAL_TABLET | Freq: Two times a day (BID) | ORAL | 3 refills | Status: DC

## 2017-03-09 NOTE — Telephone Encounter (Signed)
Comprehensive Metabolic Panel (CMET)  Order: [id number]  Status:  Final result Visible to patient:  Yes (MyChart) Dx:  HYPERTENSION, MILD; Systolic ejection...  Notes recorded by Darron Doom, RN on 03/09/2017 at 4:29 PM EDT Patient called back and she really wishes to just work on losing another 10lbs for now instead of changing her medications. She wants to wait and have lipids rechecked in December and if diet changes doesn't help then she will consider changing medications per Dr. Aundra Dubin. Lab appointment scheduled and lipids/lfts ordered. Also discussed adding potassium foods in diet. No further questions. ------  Notes recorded by Harvie Junior, CMA on 03/09/2017 at 4:21 PM EDT No answer will try patient again Monday. ------  Notes recorded by Larey Dresser, MD on 03/09/2017 at 3:42 PM EDT Goal LDL< 70, would recommend that she stop atorvastatin and start Crestor 40 mg daily. Lipids/LFTs in 2 months. Increase K in diet with mildly low K.

## 2017-03-09 NOTE — Patient Instructions (Signed)
Labs drawn today (if we do not call you, then your lab work was stable)   Your physician recommends that you schedule a follow-up appointment in: 1 year

## 2017-03-12 NOTE — Progress Notes (Signed)
Patient ID: Emily [name], female   DOB: May 06, 1951, 66 y.o.   MRN: [id number] PCP: Elyn Aquas Cardiology: Dr. Aundra Dubin  66 yo with history of CAD s/p inferior MI in 1/09 treated with PCI to OM3 presents for followup of CAD.  She has had no cardiac events since that time.  Echo in 9/16 showed EF 55-60% with mild aortic insufficiency.  Symptomatically, she has been stable.  No exertional chest pain or dyspnea.  She exercises on a treadmill.    ECG (personally reviewed): NSR, normal  Labs (12/14): LDL 90, HDL 41 Labs (6/15): LDL 106, HDL 48 Labs (7/16): LDL 102 Labs (2/17): K 4.4, creatinine 0.65, LDL 123, HDL 49, TGs 266 Labs (8/17): LDL 85, TGs 165, HDL 52  PMH: 1. CAD: Inferior MI in 1/09 with culprit vessel a large OM3.  This was treated with PCI.  Had residual 50-70% mLAD stenosis.  Echo (3/14) with EF 60-65%, mild-moderate AI - Echo (9/16): EF 55-60%, inferoseptal akinesis, mild AI.  2. HTN 3. H/o fen-phen use 4. Aortic insufficiency: Mild to moderate on echo in 3/14. Mild on 9/16 echo.  5. Carotid dopplers (9/12) with mild plaque.  6. Hyperlipidemia  SH: Married with 2 children.  Husband retired.  Quit smoking in 9/08.    FH: No CAD.  Mother with hemorrhagic CVA.  ROS: All systems reviewed and negative except as per HPI.   Current Outpatient Prescriptions  Medication Sig Dispense Refill  . Acetylcarnitine HCl (ACETYL L-CARNITINE) 250 MG CAPS Take by mouth. 200 mg every other day    . albuterol (PROVENTIL HFA;VENTOLIN HFA) 108 (90 Base) MCG/ACT inhaler Inhale 2 puffs into the lungs every 6 (six) hours as needed for wheezing or shortness of breath. 1 Inhaler 0  . ALPRAZolam (XANAX) 0.25 MG tablet Take 1-1.5 tablets by mouth as needed at bedtime 45 tablet 1  . b complex vitamins tablet Take 1 tablet by mouth daily.      . Biotin 300 MCG TABS Take 600 mcg by mouth every other day.      . Calcium-Magnesium-Zinc 500-250-12.5 MG TABS Take 1 tablet by mouth every other day.       . Chromium Picolinate 200 MCG TABS Take 1 tablet by mouth every other day.      . clobetasol cream (TEMOVATE) 0.05 % APPLY A THIN LAYER TOPICALLY TO AFFECTED AREA(S) DAILY  0  . clopidogrel (PLAVIX) 75 MG tablet Take 1 tablet (75 mg total) by mouth daily. Needs office visit 30 tablet 3  . Coenzyme Q10 (COQ10) 200 MG CAPS Take 1 capsule by mouth daily.      Marland Kitchen CRANBERRY JUICE EXTRACT PO Take 500 mg by mouth daily.      . Flaxseed, Linseed, (FLAX SEED OIL) 1000 MG CAPS Take 1,200 mg by mouth daily.      . Garlic Oil 458 MG TABS Take 1 tablet by mouth daily.      Marland Kitchen guaiFENesin-dextromethorphan (ROBITUSSIN DM) 100-10 MG/5ML syrup Take 5 mLs by mouth every 4 (four) hours as needed for cough. 118 mL 0  . ipratropium (ATROVENT) 0.03 % nasal spray Place 2 sprays into both nostrils 2 (two) times daily. Do not use for more than 5days. 30 mL 0  . nitroGLYCERIN (NITROSTAT) 0.4 MG SL tablet Place 1 tablet (0.4 mg total) under the tongue every 5 (five) minutes x 3 doses as needed for chest pain. 25 tablet 3  . omega-3 acid ethyl esters (LOVAZA) 1 g capsule Take 1 g by  mouth daily.    . Probiotic Product (ALIGN PO) Take 1 capsule by mouth daily.    . ranitidine (ZANTAC) 150 MG tablet Take 1 tablet (150 mg total) by mouth 2 (two) times daily. 180 tablet 3  . Rhodiola 300 MG CAPS Take 1 capsule by mouth daily.      Marland Kitchen atorvastatin (LIPITOR) 80 MG tablet Take 1 tablet (80 mg total) by mouth daily. 90 tablet 3  . metoprolol tartrate (LOPRESSOR) 25 MG tablet Take 0.5 tablets (12.5 mg total) by mouth 2 (two) times daily. Needs office visit 30 tablet 3   No current facility-administered medications for this encounter.     BP 130/66   Pulse 60   Wt 157 lb (71.2 kg)   SpO2 99%   BMI 29.66 kg/m  General: NAD Neck: No JVD, no thyromegaly or thyroid nodule.  Lungs: Clear to auscultation bilaterally with normal respiratory effort. CV: Nondisplaced PMI.  Heart regular S1/S2, no S3/S4, 2/6 early SEM RUSB.  No  peripheral edema.  No carotid bruit.  Normal pedal pulses.  Abdomen: Soft, nontender, no hepatosplenomegaly, no distention.  Skin: Intact without lesions or rashes.  Neurologic: Alert and oriented x 3.  Psych: Normal affect. Extremities: No clubbing or cyanosis.  HEENT: Normal.    Assessment/Plan: 1. CAD: Stable with no ischemic symptoms.  She had PCI in 1/09 in the setting of inferior MI.  She had residual untreated 50-70% mLAD stenosis.   - Continue statin and metoprolol. - She has been on Plavix long-term with no evidence for GI bleeding.   2. Hyperlipidemia: Goal LDL < 70.  Check lipids today.  She is on atorvastatin and fish oil.  3. Aortic insufficiency: Most recent echo showed only mild aortic insufficiency (had been moderate in the past).    [name] 03/12/2017

## 2017-03-17 DIAGNOSIS — R300 Dysuria: Secondary | ICD-10-CM | POA: Diagnosis not present

## 2017-03-17 DIAGNOSIS — R8290 Unspecified abnormal findings in urine: Secondary | ICD-10-CM | POA: Diagnosis not present

## 2017-03-21 ENCOUNTER — Telehealth: Payer: Self-pay | Admitting: Physician Assistant

## 2017-03-21 DIAGNOSIS — Z0110 Encounter for examination of ears and hearing without abnormal findings: Secondary | ICD-10-CM

## 2017-03-21 NOTE — Telephone Encounter (Signed)
Patient requesting referral to [facility].  Per office the referral must state AUDIOLOGICAL EVALUATION in order for Medicare to cover.  Please fax referral to [phone].

## 2017-03-22 NOTE — Telephone Encounter (Signed)
Ok to place

## 2017-03-22 NOTE — Telephone Encounter (Signed)
Ok to place referral.

## 2017-03-22 NOTE — Telephone Encounter (Signed)
Spoke with patient and she is due for a annual hearing screening. She has bilateral hearing aids and has been since 11/2015 she had evaluation. Referral placed.

## 2017-03-27 DIAGNOSIS — H903 Sensorineural hearing loss, bilateral: Secondary | ICD-10-CM | POA: Diagnosis not present

## 2017-03-28 DIAGNOSIS — L218 Other seborrheic dermatitis: Secondary | ICD-10-CM | POA: Diagnosis not present

## 2017-03-29 DIAGNOSIS — Z1231 Encounter for screening mammogram for malignant neoplasm of breast: Secondary | ICD-10-CM | POA: Diagnosis not present

## 2017-04-11 ENCOUNTER — Telehealth: Payer: Self-pay | Admitting: *Deleted

## 2017-04-11 MED ORDER — ALPRAZOLAM 0.25 MG PO TABS: ORAL_TABLET | ORAL | 1 refills | Status: DC

## 2017-04-11 NOTE — Telephone Encounter (Signed)
Patient aware that medication is being faxed.  Verified that this is the [facility], pt confirmed.

## 2017-04-11 NOTE — Telephone Encounter (Signed)
Patient calling asking if she can have a refill on her Xanax.  She states that she has not had this since October of 2017, but she would really like to have this without an appointment if possible. She states that the reason she needs it is her dog had been very sick and the medication that her dog is on makes the dog incontinent and it is making her go crazy.   Explained to patient that since it has been over a year since she had this medication, an appointment might be needed.  She wanted me to find out because she does not see why she has to come in for this when it is related to her dog.   I advised that I would send this message to her PCP and let him advise.

## 2017-04-11 NOTE — Telephone Encounter (Signed)
Rx printed for 15 tablets. Ok to fax to her pharmacy.

## 2017-05-21 ENCOUNTER — Telehealth (HOSPITAL_COMMUNITY): Payer: Self-pay | Admitting: Vascular Surgery

## 2017-05-21 NOTE — Telephone Encounter (Signed)
Pt called wanting to speak to nurse about lab work she is scheduled for this month .Marland Kitchen Please advise

## 2017-05-28 ENCOUNTER — Other Ambulatory Visit (HOSPITAL_COMMUNITY): Payer: Medicare Other

## 2017-05-31 ENCOUNTER — Other Ambulatory Visit (HOSPITAL_COMMUNITY): Payer: Medicare Other

## 2017-05-31 NOTE — Telephone Encounter (Signed)
Called patient back and she just wanted to reschedule her lab appointment for January.  Lab appointment scheduled for January.

## 2017-06-04 ENCOUNTER — Telehealth (HOSPITAL_COMMUNITY): Payer: Self-pay

## 2017-06-04 MED ORDER — METOPROLOL TARTRATE 25 MG PO TABS: 12.5000 mg | ORAL_TABLET | Freq: Two times a day (BID) | ORAL | 3 refills | Status: DC

## 2017-06-04 MED ORDER — CLOPIDOGREL BISULFATE 75 MG PO TABS: 75.0000 mg | ORAL_TABLET | Freq: Every day | ORAL | 3 refills | Status: DC

## 2017-06-04 MED ORDER — ATORVASTATIN CALCIUM 80 MG PO TABS: 80.0000 mg | ORAL_TABLET | Freq: Every day | ORAL | 3 refills | Status: DC

## 2017-06-04 MED ORDER — OMEGA-3-ACID ETHYL ESTERS 1 G PO CAPS: 1.0000 g | ORAL_CAPSULE | Freq: Every day | ORAL | 3 refills | Status: DC

## 2017-06-04 NOTE — Telephone Encounter (Signed)
Patient left VM requesting refills but did not specify which meds or which pharmacy. Left return VM to patient to call [facility]F clinic back to clarify these needs.  Renee Pain, RN

## 2017-06-04 NOTE — Telephone Encounter (Signed)
Patient returned call  Refills addressed

## 2017-06-04 NOTE — Addendum Note (Signed)
Addended by: Yahsir Wickens, Sharlot Gowda on: 06/04/2017 04:36 PM   Modules accepted: Orders

## 2017-06-30 ENCOUNTER — Other Ambulatory Visit: Payer: Self-pay | Admitting: Gastroenterology

## 2017-07-02 ENCOUNTER — Ambulatory Visit (HOSPITAL_COMMUNITY)
Admission: RE | Admit: 2017-07-02 | Discharge: 2017-07-02 | Disposition: A | Discharge status: Home / Self Care | Payer: Medicare Other | Source: Ambulatory Visit | Attending: Cardiology | Admitting: Cardiology

## 2017-07-02 DIAGNOSIS — I2510 Atherosclerotic heart disease of native coronary artery without angina pectoris: Secondary | ICD-10-CM | POA: Insufficient documentation

## 2017-07-02 LAB — HEPATIC FUNCTION PANEL
ALT: 20 U/L (ref 14–54)
AST: 23 U/L (ref 15–41)
Albumin: 3.7 g/dL (ref 3.5–5.0)
Alkaline Phosphatase: 88 U/L (ref 38–126)
Bilirubin, Direct: 0.1 mg/dL (ref 0.1–0.5)
Indirect Bilirubin: 0.7 mg/dL (ref 0.3–0.9)
TOTAL PROTEIN: 6.4 g/dL — AB (ref 6.5–8.1)
Total Bilirubin: 0.8 mg/dL (ref 0.3–1.2)

## 2017-07-02 LAB — LIPID PANEL
CHOL/HDL RATIO: 3.7 ratio
CHOLESTEROL: 184 mg/dL (ref 0–200)
HDL: 50 mg/dL (ref >40)
LDL Cholesterol: 101 mg/dL — ABNORMAL HIGH (ref 0–99)
TRIGLYCERIDES: 166 mg/dL — AB (ref <150)
VLDL: 33 mg/dL (ref 0–40)

## 2017-07-05 ENCOUNTER — Telehealth (HOSPITAL_COMMUNITY): Payer: Self-pay

## 2017-07-05 NOTE — Telephone Encounter (Signed)
Pt refuse new med se appt to speak with Dr. Aundra Dubin

## 2017-07-06 ENCOUNTER — Ambulatory Visit (INDEPENDENT_AMBULATORY_CARE_PROVIDER_SITE_OTHER): Payer: Medicare Other | Admitting: Physician Assistant

## 2017-07-06 ENCOUNTER — Encounter: Payer: Self-pay | Admitting: Physician Assistant

## 2017-07-06 ENCOUNTER — Other Ambulatory Visit: Payer: Self-pay

## 2017-07-06 ENCOUNTER — Telehealth (HOSPITAL_COMMUNITY): Payer: Self-pay

## 2017-07-06 VITALS — BP 120/78 | HR 53 | Temp 98.1°F | Resp 14 | Ht 61.0 in | Wt 154.0 lb

## 2017-07-06 DIAGNOSIS — I2510 Atherosclerotic heart disease of native coronary artery without angina pectoris: Secondary | ICD-10-CM | POA: Diagnosis not present

## 2017-07-06 DIAGNOSIS — S8991XA Unspecified injury of right lower leg, initial encounter: Secondary | ICD-10-CM

## 2017-07-06 NOTE — Telephone Encounter (Signed)
Notes recorded by Shirley Muscat, RN on 07/06/2017 at 10:06 AM EST Pt does not want to start new medication. Wants to speak with Dr. Aundra Dubin In person, appt scheduled. ------  Notes recorded by Kerry Dory, CMA on 07/05/2017 at 3:38 PM EST Left message for patient to call back.  [phone] ------  Notes recorded by Larey Dresser, MD on 07/04/2017 at 8:59 PM EST Would like to see LDL < 70 with CAD, suggest addition of Zetia 10 mg daily with lipids/LFTs in 2 months.

## 2017-07-06 NOTE — Progress Notes (Signed)
Patient presents to clinic today c/o 7 days of right knee pain after tripping over a cardboard box. Noted initial swelling that has resolved. Was able to get up and move about without issue. Notes pain now only with flexing the leg fully back. Denies buckling of the knee or weakness. Ambulating now without difficulty. Feels pain is much improved and mostly gone..  Past Medical History:  Diagnosis Date  . Anxiety   . CAD (coronary artery disease)   . Chronic insomnia   . Colon polyp   . Depression   . GERD with esophagitis   . Heart attack ([place]) 1-09  . Hyperlipemia   . Hypertension   . Perirectal abscess     Current Outpatient Medications on File Prior to Visit  Medication Sig Dispense Refill  . Acetylcarnitine HCl (ACETYL L-CARNITINE) 250 MG CAPS Take by mouth. 200 mg every other day    . albuterol (PROVENTIL HFA;VENTOLIN HFA) 108 (90 Base) MCG/ACT inhaler Inhale 2 puffs into the lungs every 6 (six) hours as needed for wheezing or shortness of breath. 1 Inhaler 0  . ALPRAZolam (XANAX) 0.25 MG tablet Take 1-1.5 tablets by mouth as needed at bedtime 15 tablet 1  . atorvastatin (LIPITOR) 80 MG tablet Take 1 tablet (80 mg total) by mouth daily. 90 tablet 3  . b complex vitamins tablet Take 1 tablet by mouth daily.      . Biotin 300 MCG TABS Take 600 mcg by mouth every other day.      . Calcium-Magnesium-Zinc 500-250-12.5 MG TABS Take 1 tablet by mouth every other day.      . Chromium Picolinate 200 MCG TABS Take 1 tablet by mouth every other day.      . clobetasol cream (TEMOVATE) 0.05 % APPLY A THIN LAYER TOPICALLY TO AFFECTED AREA(S) DAILY  0  . clopidogrel (PLAVIX) 75 MG tablet Take 1 tablet (75 mg total) by mouth daily. Needs office visit 90 tablet 3  . Coenzyme Q10 (COQ10) 200 MG CAPS Take 1 capsule by mouth daily.      Marland Kitchen CRANBERRY JUICE EXTRACT PO Take 500 mg by mouth daily.      . Flaxseed, Linseed, (FLAX SEED OIL) 1000 MG CAPS Take 1,200 mg by mouth daily.      . Garlic Oil  932 MG TABS Take 1 tablet by mouth daily.      Marland Kitchen guaiFENesin-dextromethorphan (ROBITUSSIN DM) 100-10 MG/5ML syrup Take 5 mLs by mouth every 4 (four) hours as needed for cough. 118 mL 0  . ipratropium (ATROVENT) 0.03 % nasal spray Place 2 sprays into both nostrils 2 (two) times daily. Do not use for more than 5days. 30 mL 0  . metoprolol tartrate (LOPRESSOR) 25 MG tablet Take 0.5 tablets (12.5 mg total) by mouth 2 (two) times daily. Needs office visit 30 tablet 3  . nitroGLYCERIN (NITROSTAT) 0.4 MG SL tablet Place 1 tablet (0.4 mg total) under the tongue every 5 (five) minutes x 3 doses as needed for chest pain. 25 tablet 3  . omega-3 acid ethyl esters (LOVAZA) 1 g capsule Take 1 capsule (1 g total) by mouth daily. 90 capsule 3  . Probiotic Product (ALIGN PO) Take 1 capsule by mouth daily.    . ranitidine (ZANTAC) 150 MG tablet TAKE 1 TABLET BY MOUTH TWICE A DAY 180 tablet 0  . Rhodiola 300 MG CAPS Take 1 capsule by mouth daily.       No current facility-administered medications on file prior to visit.  Allergies  Allergen Reactions  . Benzonatate     Unknown, patient states "it made me sick"  . Chlorpromazine Hcl     unknown  . Codeine     REACTION: causes vomiting  . Doxycycline     "explosive diarrhea" -does not wish to take this anymore  . Hydrocodone     REACTION: causes vomiing and nausea  . Hydrocodone-Acetaminophen Nausea And Vomiting  . Lovaza [Omega-3-Acid Ethyl Esters] Other (See Comments)    Loose stools with two capsules  . Penicillins     REACTION: causes rash  . Prochlorperazine Edisylate     unknown   . Promethazine Hcl     unknown  . Sulfonamide Derivatives     unknown     Family History  Problem Relation Age of Onset  . Skin cancer Mother   . Allergy (severe) Father   . Heart disease Father   . Leukemia Father        CML  . Renal Disease Paternal Grandfather        one removed   . Colon cancer Neg Hx     Social History   Socioeconomic History    . Marital status: Married    Spouse name: Not on file  . Number of children: 2  . Years of education: Not on file  . Highest education level: Not on file  Social Needs  . Financial resource strain: Not on file  . Food insecurity - worry: Not on file  . Food insecurity - inability: Not on file  . Transportation needs - medical: Not on file  . Transportation needs - non-medical: Not on file  Occupational History  . Occupation: Risk manager  Tobacco Use  . Smoking status: Former Smoker    Packs/day: 2.00    Years: 20.00    Pack years: 40.00    Types: Cigarettes    Last attempt to quit: 06/19/2006    Years since quitting: 11.0  . Smokeless tobacco: Never Used  Substance and Sexual Activity  . Alcohol use: Yes    Alcohol/week: 0.0 oz    Comment: <2 monthly; rare  . Drug use: No  . Sexual activity: Not on file  Other Topics Concern  . Not on file  Social History Narrative   Resides in [place] with her husband.    Husband works in [place] and comes home two to three times a year   2 children who go to [place]   Just got accepted to [employer]   Exercise- NO    Review of Systems - See HPI.  All other ROS are negative.  There were no vitals taken for this visit.  Physical Exam  Constitutional: She is oriented to person, place, and time and well-developed, well-nourished, and in no distress.  HENT:  Head: Normocephalic and atraumatic.  Eyes: Conjunctivae are normal.  Cardiovascular: Normal rate, regular rhythm, normal heart sounds and intact distal pulses.  Pulmonary/Chest: Effort normal.  Musculoskeletal:       Right knee: She exhibits ecchymosis (resolving). She exhibits normal range of motion, no swelling, no effusion and normal patellar mobility. No medial joint line and no lateral joint line tenderness noted.  Neurological: She is alert and oriented to person, place, and time.  Skin: Skin is warm and dry. No rash noted.  Vitals reviewed.   Recent Results (from the  past 2160 hour(s))  Hepatic function panel     Status: Abnormal   Collection Time: 07/02/17 11:01 AM  Result  Value Ref Range   Total Protein 6.4 (L) 6.5 - 8.1 g/dL   Albumin 3.7 3.5 - 5.0 g/dL   AST 23 15 - 41 U/L   ALT 20 14 - 54 U/L   Alkaline Phosphatase 88 38 - 126 U/L   Total Bilirubin 0.8 0.3 - 1.2 mg/dL   Bilirubin, Direct 0.1 0.1 - 0.5 mg/dL   Indirect Bilirubin 0.7 0.3 - 0.9 mg/dL  Lipid Profile     Status: Abnormal   Collection Time: 07/02/17 11:01 AM  Result Value Ref Range   Cholesterol 184 0 - 200 mg/dL   Triglycerides 166 (H) <150 mg/dL   HDL 50 >40 mg/dL   Total CHOL/HDL Ratio 3.7 RATIO   VLDL 33 0 - 40 mg/dL   LDL Cholesterol 101 (H) 0 - 99 mg/dL    Comment:        Total Cholesterol/HDL:CHD Risk Coronary Heart Disease Risk Table                     Men   Women  1/2 Average Risk   3.4   3.3  Average Risk       5.0   4.4  2 X Average Risk   9.6   7.1  3 X Average Risk  23.4   11.0        Use the calculated Patient Ratio above and the CHD Risk Table to determine the patient's CHD Risk.        ATP III CLASSIFICATION (LDL):  <100     mg/dL   Optimal  100-129  mg/dL   Near or Above                    Optimal  130-159  mg/dL   Borderline  160-189  mg/dL   High  >190     mg/dL   Very High     Assessment/Plan: 1. Injury of right knee, initial encounter Fall with hyperflexion. Improving on its own. No tenderness on exam. Good ROM and strength 5/5. Supportive measures and OTC medications reviewed. Patient to apply knee brace   Leeanne Rio, PA-C

## 2017-07-06 NOTE — Patient Instructions (Addendum)
Please wear the compression sleeve as directed. Keep leg elevated while resting. Avoid kneeling if possible. Apply Topical Icy Hot to the area. Tylenol if needed.  Symptoms should continue to improve and resolve.

## 2017-07-10 DIAGNOSIS — R399 Unspecified symptoms and signs involving the genitourinary system: Secondary | ICD-10-CM | POA: Diagnosis not present

## 2017-07-10 DIAGNOSIS — R8279 Other abnormal findings on microbiological examination of urine: Secondary | ICD-10-CM | POA: Diagnosis not present

## 2017-08-14 ENCOUNTER — Encounter (HOSPITAL_COMMUNITY): Payer: Self-pay | Admitting: Cardiology

## 2017-08-14 ENCOUNTER — Ambulatory Visit (HOSPITAL_COMMUNITY)
Admission: RE | Admit: 2017-08-14 | Discharge: 2017-08-14 | Disposition: A | Discharge status: Home / Self Care | Payer: Medicare Other | Source: Ambulatory Visit | Attending: Cardiology | Admitting: Cardiology

## 2017-08-14 VITALS — BP 128/68 | HR 60 | Wt 154.8 lb

## 2017-08-14 DIAGNOSIS — E785 Hyperlipidemia, unspecified: Secondary | ICD-10-CM | POA: Diagnosis not present

## 2017-08-14 DIAGNOSIS — I351 Nonrheumatic aortic (valve) insufficiency: Secondary | ICD-10-CM | POA: Diagnosis not present

## 2017-08-14 DIAGNOSIS — Z823 Family history of stroke: Secondary | ICD-10-CM | POA: Diagnosis not present

## 2017-08-14 DIAGNOSIS — Z7902 Long term (current) use of antithrombotics/antiplatelets: Secondary | ICD-10-CM | POA: Diagnosis not present

## 2017-08-14 DIAGNOSIS — I6529 Occlusion and stenosis of unspecified carotid artery: Secondary | ICD-10-CM | POA: Diagnosis not present

## 2017-08-14 DIAGNOSIS — Z9889 Other specified postprocedural states: Secondary | ICD-10-CM | POA: Diagnosis not present

## 2017-08-14 DIAGNOSIS — Z87891 Personal history of nicotine dependence: Secondary | ICD-10-CM | POA: Diagnosis not present

## 2017-08-14 DIAGNOSIS — I10 Essential (primary) hypertension: Secondary | ICD-10-CM | POA: Diagnosis not present

## 2017-08-14 DIAGNOSIS — Z79899 Other long term (current) drug therapy: Secondary | ICD-10-CM | POA: Diagnosis not present

## 2017-08-14 DIAGNOSIS — I2510 Atherosclerotic heart disease of native coronary artery without angina pectoris: Secondary | ICD-10-CM | POA: Insufficient documentation

## 2017-08-14 DIAGNOSIS — I252 Old myocardial infarction: Secondary | ICD-10-CM | POA: Diagnosis not present

## 2017-08-14 MED ORDER — ROSUVASTATIN CALCIUM 40 MG PO TABS: 40.0000 mg | ORAL_TABLET | Freq: Every day | ORAL | 3 refills | Status: DC

## 2017-08-14 NOTE — Patient Instructions (Signed)
Stop Atorvastatin   Start Crestor 40 mg (1 tab) daily  Your physician recommends that you return for lab work in: 2 months  Your physician recommends that you schedule a follow-up appointment in: 1 year (February 2020) with Dr. Aundra Dubin Call us to schedule

## 2017-08-14 NOTE — Progress Notes (Signed)
Patient ID: Emily [name], female   DOB: [birth year], 67 y.o.   MRN: [id number] PCP: Elyn Aquas Cardiology: Dr. Aundra Dubin  67 y.o. with history of CAD s/p inferior MI in 1/09 treated with PCI to OM3 presents for followup of CAD.  She has had no cardiac events since that time.  Echo in 9/16 showed EF 55-60% with mild aortic insufficiency.  Symptomatically, she has been stable.  No exertional chest pain or dyspnea.  She has been under a lot of stress recently given father and step-mother's illnesses. Weight is down 3 lbs.    ECG (personally reviewed): NSR rate 52  Labs (12/14): LDL 90, HDL 41 Labs (6/15): LDL 106, HDL 48 Labs (7/16): LDL 102 Labs (2/17): K 4.4, creatinine 0.65, LDL 123, HDL 49, TGs 266 Labs (8/17): LDL 85, TGs 165, HDL 52 Labs (9/18): K 3.4, creatinine 0.66 Labs (1/19): LDL 101, HDL 50  PMH: 1. CAD: Inferior MI in 1/09 with culprit vessel a large OM3.  This was treated with PCI.  Had residual 50-70% mLAD stenosis.  Echo (3/14) with EF 60-65%, mild-moderate AI - Echo (9/16): EF 55-60%, inferoseptal akinesis, mild AI.  2. HTN 3. H/o fen-phen use 4. Aortic insufficiency: Mild to moderate on echo in 3/14. Mild on 9/16 echo.  5. Carotid dopplers (9/12) with mild plaque.  6. Hyperlipidemia  SH: Married with 2 children.  Husband retired.  Quit smoking in 9/08.    FH: No CAD.  Mother with hemorrhagic CVA.  ROS: All systems reviewed and negative except as per HPI.   Current Outpatient Medications  Medication Sig Dispense Refill  . Acetylcarnitine HCl (ACETYL L-CARNITINE) 250 MG CAPS Take by mouth. 200 mg every other day    . albuterol (PROVENTIL HFA;VENTOLIN HFA) 108 (90 Base) MCG/ACT inhaler Inhale 2 puffs into the lungs every 6 (six) hours as needed for wheezing or shortness of breath. 1 Inhaler 0  . ALPRAZolam (XANAX) 0.25 MG tablet Take 1-1.5 tablets by mouth as needed at bedtime 15 tablet 1  . b complex vitamins tablet Take 1 tablet by mouth daily.      . Biotin 300  MCG TABS Take 600 mcg by mouth every other day.      . Calcium-Magnesium-Zinc 500-250-12.5 MG TABS Take 1 tablet by mouth every other day.      . Chromium Picolinate 200 MCG TABS Take 1 tablet by mouth every other day.      . clobetasol cream (TEMOVATE) 0.05 % APPLY A THIN LAYER TOPICALLY TO AFFECTED AREA(S) DAILY  0  . clopidogrel (PLAVIX) 75 MG tablet Take 1 tablet (75 mg total) by mouth daily. Needs office visit 90 tablet 3  . Coenzyme Q10 (COQ10) 200 MG CAPS Take 1 capsule by mouth daily.      Marland Kitchen CRANBERRY JUICE EXTRACT PO Take 500 mg by mouth daily.      . Flaxseed, Linseed, (FLAX SEED OIL) 1000 MG CAPS Take 1,200 mg by mouth daily.      . Garlic Oil 009 MG TABS Take 1 tablet by mouth daily.      . metoprolol tartrate (LOPRESSOR) 25 MG tablet Take 0.5 tablets (12.5 mg total) by mouth 2 (two) times daily. Needs office visit 30 tablet 3  . nitroGLYCERIN (NITROSTAT) 0.4 MG SL tablet Place 1 tablet (0.4 mg total) under the tongue every 5 (five) minutes x 3 doses as needed for chest pain. 25 tablet 3  . omega-3 acid ethyl esters (LOVAZA) 1 g capsule Take 1 capsule (  1 g total) by mouth daily. 90 capsule 3  . Probiotic Product (ALIGN PO) Take 1 capsule by mouth daily.    . ranitidine (ZANTAC) 150 MG tablet TAKE 1 TABLET BY MOUTH TWICE A DAY 180 tablet 0  . Rhodiola 300 MG CAPS Take 1 capsule by mouth daily.      . rosuvastatin (CRESTOR) 40 MG tablet Take 1 tablet (40 mg total) by mouth daily. 30 tablet 3   No current facility-administered medications for this encounter.     BP 128/68   Pulse 60   Wt 154 lb 12 oz (70.2 kg)   SpO2 100%   BMI 29.24 kg/m  General: NAD Neck: No JVD, no thyromegaly or thyroid nodule.  Lungs: Clear to auscultation bilaterally with normal respiratory effort. CV: Nondisplaced PMI.  Heart regular S1/S2, no S3/S4, 1/6 SEM RUSB.  No peripheral edema.  No carotid bruit.  Normal pedal pulses.  Abdomen: Soft, nontender, no hepatosplenomegaly, no distention.  Skin:  Intact without lesions or rashes.  Neurologic: Alert and oriented x 3.  Psych: Normal affect. Extremities: No clubbing or cyanosis.  HEENT: Normal.   Assessment/Plan: 1. CAD: Stable with no ischemic symptoms.  She had PCI in 1/09 in the setting of inferior MI.  She had residual untreated 50-70% mLAD stenosis.   - Continue metoprolol and statin. - She has been on Plavix long-term with no evidence for GI bleeding.   2. Hyperlipidemia: Goal LDL < 70.  LDL has been > goal on atorvastatin 80 mg daily.  - Stop atorvastatin and start Crestor 40 mg daily with lipids/LFTs in 2 months.  3. Aortic insufficiency: Most recent echo showed only mild aortic insufficiency (had been moderate in the past).  Given lack of symptoms, will not repeat echo at this time.   Followup in 1 year.   [name] 08/14/2017

## 2017-08-16 ENCOUNTER — Other Ambulatory Visit (HOSPITAL_COMMUNITY): Payer: Self-pay | Admitting: *Deleted

## 2017-08-16 MED ORDER — ROSUVASTATIN CALCIUM 40 MG PO TABS: 40.0000 mg | ORAL_TABLET | Freq: Every day | ORAL | 3 refills | Status: DC

## 2017-08-30 ENCOUNTER — Encounter: Payer: Self-pay | Admitting: Physician Assistant

## 2017-08-30 ENCOUNTER — Ambulatory Visit (INDEPENDENT_AMBULATORY_CARE_PROVIDER_SITE_OTHER): Payer: Medicare Other | Admitting: Physician Assistant

## 2017-08-30 ENCOUNTER — Other Ambulatory Visit: Payer: Self-pay

## 2017-08-30 ENCOUNTER — Telehealth: Payer: Self-pay | Admitting: Physician Assistant

## 2017-08-30 VITALS — BP 130/84 | HR 54 | Temp 97.7°F | Resp 16 | Ht 61.0 in | Wt 154.0 lb

## 2017-08-30 DIAGNOSIS — I2510 Atherosclerotic heart disease of native coronary artery without angina pectoris: Secondary | ICD-10-CM | POA: Diagnosis not present

## 2017-08-30 DIAGNOSIS — M545 Low back pain, unspecified: Secondary | ICD-10-CM

## 2017-08-30 DIAGNOSIS — M546 Pain in thoracic spine: Secondary | ICD-10-CM

## 2017-08-30 LAB — POCT URINALYSIS DIPSTICK
BILIRUBIN UA: NEGATIVE
Glucose, UA: NEGATIVE
KETONES UA: NEGATIVE
Leukocytes, UA: NEGATIVE
Nitrite, UA: NEGATIVE
PH UA: 5 (ref 5.0–8.0)
RBC UA: NEGATIVE
Spec Grav, UA: >=1.03 — AB (ref 1.010–1.025)
UROBILINOGEN UA: 0.2 U/dL

## 2017-08-30 MED ORDER — CYCLOBENZAPRINE HCL 10 MG PO TABS: 10.0000 mg | ORAL_TABLET | Freq: Three times a day (TID) | ORAL | 0 refills | Status: DC | PRN

## 2017-08-30 NOTE — Patient Instructions (Signed)
Please stay well-hydrated and get plenty of rest.  Limit heavy lifting or overexertion.  Continue Motrin 600 mg up to every 8 hours as needed. Apply heating pad to the ara in 10-15 minute intervals.  Use the Flexeril as directed. Do not drive or operate heavy machinery while taking this medication.   Follow-up if symptoms are not resolving over the next 3-4 days.    Back Pain, Adult Back pain is very common. The pain often gets better over time. The cause of back pain is usually not dangerous. Most people can learn to manage their back pain on their own. Follow these instructions at home: Watch your back pain for any changes. The following actions may help to lessen any pain you are feeling:  Stay active. Start with short walks on flat ground if you can. Try to walk farther each day.  Exercise regularly as told by your doctor. Exercise helps your back heal faster. It also helps avoid future injury by keeping your muscles strong and flexible.  Do not sit, drive, or stand in one place for more than 30 minutes.  Do not stay in bed. Resting more than 1-2 days can slow down your recovery.  Be careful when you bend or lift an object. Use good form when lifting: ? Bend at your knees. ? Keep the object close to your body. ? Do not twist.  Sleep on a firm mattress. Lie on your side, and bend your knees. If you lie on your back, put a pillow under your knees.  Take medicines only as told by your doctor.  Put ice on the injured area. ? Put ice in a plastic bag. ? Place a towel between your skin and the bag. ? Leave the ice on for 20 minutes, 2-3 times a day for the first 2-3 days. After that, you can switch between ice and heat packs.  Avoid feeling anxious or stressed. Find good ways to deal with stress, such as exercise.  Maintain a healthy weight. Extra weight puts stress on your back.  Contact a doctor if:  You have pain that does not go away with rest or medicine.  You have  worsening pain that goes down into your legs or buttocks.  You have pain that does not get better in one week.  You have pain at night.  You lose weight.  You have a fever or chills. Get help right away if:  You cannot control when you poop (bowel movement) or pee (urinate).  Your arms or legs feel weak.  Your arms or legs lose feeling (numbness).  You feel sick to your stomach (nauseous) or throw up (vomit).  You have belly (abdominal) pain.  You feel like you may pass out (faint). This information is not intended to replace advice given to you by your health care provider. Make sure you discuss any questions you have with your health care provider. Document Released: 11/22/2007 Document Revised: 11/11/2015 Document Reviewed: 10/07/2013 Elsevier Interactive Patient Education  [employer].

## 2017-08-30 NOTE — Telephone Encounter (Signed)
Copied from [id number]. Topic: Quick Communication - See Telephone Encounter >> Aug 30, 2017  1:58 PM Vernona Rieger wrote: CRM for notification. See Telephone encounter for:   08/30/17.  Patient said that she was seen in the office today & on the discharge papers it said take motrin 600mg  every 8 hours. She said she was taking it every 6 hours and it was pretty much working. This am she only took 2 this am instead of 3 and its 2pm and her pain has not stopped. She said she doesn't think that is going to work. She said can she take 3 pills every 6 hours?  Please call patient back [phone] (H)

## 2017-08-30 NOTE — Progress Notes (Signed)
Patient presents to clinic today c/o 2 weeks of lower and thoracic back pain. Denies trauma or injury. Symptoms stated while on a cruise where she slept on a hard bed and did a lot of hiking. States that pain is aching and a tightness.  Endorses lower back pain is mostly resolved. Thoracic pain is intermittent and not worsened with position. Denies urinary urgency, frequency, hematuria, flank pain, nausea or vomiting. Denies bruising. Has taken Motrin with some relief of symptoms.    Past Medical History:  Diagnosis Date  . Anxiety   . CAD (coronary artery disease)   . Chronic insomnia   . Colon polyp   . Depression   . GERD with esophagitis   . Heart attack ([place]) 1-09  . Hyperlipemia   . Hypertension   . Perirectal abscess     Current Outpatient Medications on File Prior to Visit  Medication Sig Dispense Refill  . Acetylcarnitine HCl (ACETYL L-CARNITINE) 250 MG CAPS Take by mouth. 200 mg every other day    . albuterol (PROVENTIL HFA;VENTOLIN HFA) 108 (90 Base) MCG/ACT inhaler Inhale 2 puffs into the lungs every 6 (six) hours as needed for wheezing or shortness of breath. 1 Inhaler 0  . ALPRAZolam (XANAX) 0.25 MG tablet Take 1-1.5 tablets by mouth as needed at bedtime 15 tablet 1  . b complex vitamins tablet Take 1 tablet by mouth daily.      . Biotin 300 MCG TABS Take 600 mcg by mouth every other day.      . Calcium-Magnesium-Zinc 500-250-12.5 MG TABS Take 1 tablet by mouth every other day.      . Chromium Picolinate 200 MCG TABS Take 1 tablet by mouth every other day.      . clobetasol cream (TEMOVATE) 0.05 % APPLY A THIN LAYER TOPICALLY TO AFFECTED AREA(S) DAILY  0  . clopidogrel (PLAVIX) 75 MG tablet Take 1 tablet (75 mg total) by mouth daily. Needs office visit 90 tablet 3  . Coenzyme Q10 (COQ10) 200 MG CAPS Take 1 capsule by mouth daily.      Marland Kitchen CRANBERRY JUICE EXTRACT PO Take 500 mg by mouth daily.      . Flaxseed, Linseed, (FLAX SEED OIL) 1000 MG CAPS Take 1,200 mg by mouth  daily.      . Garlic Oil 196 MG TABS Take 1 tablet by mouth daily.      . metoprolol tartrate (LOPRESSOR) 25 MG tablet Take 0.5 tablets (12.5 mg total) by mouth 2 (two) times daily. Needs office visit 30 tablet 3  . nitroGLYCERIN (NITROSTAT) 0.4 MG SL tablet Place 1 tablet (0.4 mg total) under the tongue every 5 (five) minutes x 3 doses as needed for chest pain. 25 tablet 3  . omega-3 acid ethyl esters (LOVAZA) 1 g capsule Take 1 capsule (1 g total) by mouth daily. 90 capsule 3  . Probiotic Product (ALIGN PO) Take 1 capsule by mouth daily.    . ranitidine (ZANTAC) 150 MG tablet TAKE 1 TABLET BY MOUTH TWICE A DAY 180 tablet 0  . Rhodiola 300 MG CAPS Take 1 capsule by mouth daily.      . rosuvastatin (CRESTOR) 40 MG tablet Take 1 tablet (40 mg total) by mouth daily. (Patient not taking: Reported on 08/30/2017) 30 tablet 3   No current facility-administered medications on file prior to visit.     Allergies  Allergen Reactions  . Benzonatate     Unknown, patient states "it made me sick"  . Chlorpromazine  Hcl     unknown  . Codeine     REACTION: causes vomiting  . Doxycycline     "explosive diarrhea" -does not wish to take this anymore  . Hydrocodone     REACTION: causes vomiing and nausea  . Hydrocodone-Acetaminophen Nausea And Vomiting  . Lovaza [Omega-3-Acid Ethyl Esters] Other (See Comments)    Loose stools with two capsules  . Penicillins     REACTION: causes rash  . Prochlorperazine Edisylate     unknown   . Promethazine Hcl     unknown  . Sulfonamide Derivatives     unknown     Family History  Problem Relation Age of Onset  . Skin cancer Mother   . Allergy (severe) Father   . Heart disease Father   . Leukemia Father        CML  . Renal Disease Paternal Grandfather        one removed   . Colon cancer Neg Hx     Social History   Socioeconomic History  . Marital status: Married    Spouse name: None  . Number of children: 2  . Years of education: None  .  Highest education level: None  Social Needs  . Financial resource strain: None  . Food insecurity - worry: None  . Food insecurity - inability: None  . Transportation needs - medical: None  . Transportation needs - non-medical: None  Occupational History  . Occupation: Risk manager  Tobacco Use  . Smoking status: Former Smoker    Packs/day: 2.00    Years: 20.00    Pack years: 40.00    Types: Cigarettes    Last attempt to quit: 06/19/2006    Years since quitting: 11.2  . Smokeless tobacco: Never Used  Substance and Sexual Activity  . Alcohol use: Yes    Alcohol/week: 0.0 oz    Comment: <2 monthly; rare  . Drug use: No  . Sexual activity: None  Other Topics Concern  . None  Social History Narrative   Resides in [place] with her husband.    Husband works in [place] and comes home two to three times a year   2 children who go to [place]   Just got accepted to [employer]   Exercise- NO   Review of Systems - See HPI.  All other ROS are negative.  BP 130/84   Pulse (!) 54   Temp 97.7 F (36.5 C) (Oral)   Resp 16   Ht 5\' 1"  (1.549 m)   Wt 154 lb (69.9 kg)   SpO2 98%   BMI 29.10 kg/m   Physical Exam  Constitutional: She is oriented to person, place, and time.  Cardiovascular: Normal rate, regular rhythm, normal heart sounds and intact distal pulses.  Pulmonary/Chest: Effort normal. No respiratory distress. She has no wheezes. She has no rales. She exhibits no tenderness.  Abdominal: Soft. There is no CVA tenderness.  Musculoskeletal:       Thoracic back: She exhibits tenderness, pain and spasm. She exhibits no bony tenderness.  Neurological: She is alert and oriented to person, place, and time.  Skin: Skin is warm. No rash noted.  Psychiatric: Affect normal.  Vitals reviewed.  Recent Results (from the past 2160 hour(s))  Hepatic function panel     Status: Abnormal   Collection Time: 07/02/17 11:01 AM  Result Value Ref Range   Total Protein 6.4 (L) 6.5 - 8.1 g/dL    Albumin 3.7 3.5 - 5.0 g/dL  AST 23 15 - 41 U/L   ALT 20 14 - 54 U/L   Alkaline Phosphatase 88 38 - 126 U/L   Total Bilirubin 0.8 0.3 - 1.2 mg/dL   Bilirubin, Direct 0.1 0.1 - 0.5 mg/dL   Indirect Bilirubin 0.7 0.3 - 0.9 mg/dL  Lipid Profile     Status: Abnormal   Collection Time: 07/02/17 11:01 AM  Result Value Ref Range   Cholesterol 184 0 - 200 mg/dL   Triglycerides 166 (H) <150 mg/dL   HDL 50 >40 mg/dL   Total CHOL/HDL Ratio 3.7 RATIO   VLDL 33 0 - 40 mg/dL   LDL Cholesterol 101 (H) 0 - 99 mg/dL    Comment:        Total Cholesterol/HDL:CHD Risk Coronary Heart Disease Risk Table                     Men   Women  1/2 Average Risk   3.4   3.3  Average Risk       5.0   4.4  2 X Average Risk   9.6   7.1  3 X Average Risk  23.4   11.0        Use the calculated Patient Ratio above and the CHD Risk Table to determine the patient's CHD Risk.        ATP III CLASSIFICATION (LDL):  <100     mg/dL   Optimal  100-129  mg/dL   Near or Above                    Optimal  130-159  mg/dL   Borderline  160-189  mg/dL   High  >190     mg/dL   Very High    Assessment/Plan: 1. Acute left-sided thoracic back pain Urine dip negative. Start Motrin 600 mg Q8H as needed for pain. Heating pad. Limit heavy lifting. Start Flexeril. Follow-up discussed.  - POCT Urinalysis Dipstick - cyclobenzaprine (FLEXERIL) 10 MG tablet; Take 1 tablet (10 mg total) by mouth 3 (three) times daily as needed for muscle spasms.  Dispense: 30 tablet; Refill: 0  2. Acute bilateral low back pain without sciatica Resolved per patient. Supportive measures reviewed. Will monitor.     Leeanne Rio, PA-C

## 2017-08-31 ENCOUNTER — Telehealth: Payer: Self-pay | Admitting: Physician Assistant

## 2017-08-31 ENCOUNTER — Ambulatory Visit: Payer: Self-pay | Admitting: *Deleted

## 2017-08-31 DIAGNOSIS — B029 Zoster without complications: Secondary | ICD-10-CM

## 2017-08-31 HISTORY — DX: Zoster without complications: B02.9

## 2017-08-31 MED ORDER — MELOXICAM 15 MG PO TABS: 15.0000 mg | ORAL_TABLET | Freq: Every day | ORAL | 0 refills | Status: DC

## 2017-08-31 NOTE — Telephone Encounter (Addendum)
Pt called stating that for 2 days she has been in pain and is not getting relief after taking 3 tylenol every six hour; she  States that she "is very upset because she has been in pain for 3 days", and she "has a low threshold for pain";   pt given directions per Raiford Noble dated 08/31/17, "Have her stop the Motrin.;Start the Meloxicam once daily as directed. I have sent in Rx. Tylenol for breakthrough pain -- recommend Tylenol Arthritis as it is long-acting. If not improving, she will need reassessment"; pt also reminded that she has prescription for flexeril that can be taken and also reviewed AVS summary for  Management of back pain; she verifies understanding and will call back if she has no improvement; the pt also states that her best contact number is [phone] and messages can be left.

## 2017-08-31 NOTE — Telephone Encounter (Signed)
Have her stop the Motrin. Start the Meloxicam once daily as directed. I have sent in Rx. Tylenol for breakthrough pain -- recommend Tylenol Arthritis as it is long-acting.   If not improving, she will need reassessment.

## 2017-08-31 NOTE — Telephone Encounter (Signed)
She saw Raiford Noble on 08/30/17.   Now has a question about the Motrin dosage.   See attached question.   Thanks.

## 2017-08-31 NOTE — Telephone Encounter (Signed)
Pt states Motrin is not covering her pain; has been taking Q6 hours. She is requesting something stronger called in for pain. Pt is upset and is requesting call from office with resolution ASAP. States please leave detailed message if she can not get to phone.  [phone]

## 2017-08-31 NOTE — Telephone Encounter (Signed)
Spoke with patient about medications and she is aware of the new RX and the tylenol.   She is aware and she will start this medication. She is aware that if there is no improvement, she needs to call us to be seen.  Stated verbal understanding.

## 2017-08-31 NOTE — Telephone Encounter (Signed)
She can do every 6 hours but recommend we do that for the shortest duration where it is needed this frequently.

## 2017-08-31 NOTE — Telephone Encounter (Signed)
LM for patient to call.   CRM created.  Okay for PEC to disclose information to patient.

## 2017-09-01 DIAGNOSIS — I10 Essential (primary) hypertension: Secondary | ICD-10-CM | POA: Diagnosis not present

## 2017-09-01 DIAGNOSIS — B029 Zoster without complications: Secondary | ICD-10-CM | POA: Diagnosis not present

## 2017-09-03 ENCOUNTER — Ambulatory Visit: Payer: Self-pay | Admitting: *Deleted

## 2017-09-03 ENCOUNTER — Emergency Department (HOSPITAL_COMMUNITY)
Admission: EM | Admit: 2017-09-03 | Discharge: 2017-09-03 | Disposition: A | Discharge status: Home / Self Care | Payer: Medicare Other | Attending: Emergency Medicine | Admitting: Emergency Medicine

## 2017-09-03 ENCOUNTER — Other Ambulatory Visit: Payer: Self-pay

## 2017-09-03 ENCOUNTER — Encounter (HOSPITAL_COMMUNITY): Payer: Self-pay

## 2017-09-03 DIAGNOSIS — I10 Essential (primary) hypertension: Secondary | ICD-10-CM | POA: Insufficient documentation

## 2017-09-03 DIAGNOSIS — Z79899 Other long term (current) drug therapy: Secondary | ICD-10-CM | POA: Insufficient documentation

## 2017-09-03 DIAGNOSIS — T7840XA Allergy, unspecified, initial encounter: Secondary | ICD-10-CM | POA: Diagnosis not present

## 2017-09-03 DIAGNOSIS — Z87891 Personal history of nicotine dependence: Secondary | ICD-10-CM | POA: Diagnosis not present

## 2017-09-03 DIAGNOSIS — I259 Chronic ischemic heart disease, unspecified: Secondary | ICD-10-CM | POA: Insufficient documentation

## 2017-09-03 DIAGNOSIS — B029 Zoster without complications: Secondary | ICD-10-CM | POA: Insufficient documentation

## 2017-09-03 DIAGNOSIS — R070 Pain in throat: Secondary | ICD-10-CM | POA: Diagnosis present

## 2017-09-03 HISTORY — DX: Zoster without complications: B02.9

## 2017-09-03 MED ORDER — HYDROXYZINE HCL 25 MG PO TABS
25.0000 mg | ORAL_TABLET | Freq: Once | ORAL | Status: AC
  Administered 2017-09-03: 25 mg via ORAL
  Filled 2017-09-03: qty 1

## 2017-09-03 MED ORDER — EPINEPHRINE 0.3 MG/0.3ML IJ SOAJ: 0.3000 mg | Freq: Once | INTRAMUSCULAR | 0 refills | Status: AC

## 2017-09-03 MED ORDER — FAMOTIDINE 20 MG PO TABS
20.0000 mg | ORAL_TABLET | Freq: Once | ORAL | Status: DC
  Filled 2017-09-03: qty 1

## 2017-09-03 MED ORDER — PREDNISONE 50 MG PO TABS: ORAL_TABLET | ORAL | 0 refills | Status: DC

## 2017-09-03 MED ORDER — EPINEPHRINE 0.3 MG/0.3ML IJ SOAJ
0.3000 mg | Freq: Once | INTRAMUSCULAR | Status: DC
  Filled 2017-09-03: qty 0.3

## 2017-09-03 MED ORDER — DEXAMETHASONE 4 MG PO TABS
10.0000 mg | ORAL_TABLET | Freq: Once | ORAL | Status: AC
  Administered 2017-09-03: 14:00:00 10 mg via ORAL
  Filled 2017-09-03: qty 2

## 2017-09-03 NOTE — ED Notes (Signed)
Pt reports feeling as is swelling has come down and congestion/drainage has been resolved since receiving medications. Pt does not feel as is the epi-pen is necessary. Pt and family member verbalize frustration at this time at lack of communication between health care providers that has spoken with pt, and are still not really clear as to what is going on or what thay (the health care team) are planning on doing or what is the cause for the swelling. This Probation officer has informed MD Kathrynn Humble and he will come see the pt.

## 2017-09-03 NOTE — ED Provider Notes (Signed)
[facility] DEPT Provider Note   CSN: [id number] Arrival date & time: 09/03/17  1203     History   Chief Complaint Chief Complaint  Patient presents with  . Oral Swelling    HPI Emily [name] is a 67 y.o. female  Recently dx with shingles and taking Acyclovir and tramadol who presents to the ED with sore throat. She called her PCP this morning due to the pain with swallowing. They told her to come to the ED. The throat swelling started this morning when she woke. Patient concerned she may be having allergic reaction to medication.   HPI  Past Medical History:  Diagnosis Date  . Anxiety   . CAD (coronary artery disease)   . Chronic insomnia   . Colon polyp   . Depression   . GERD with esophagitis   . Heart attack ([place]) 1-09  . Hyperlipemia   . Hypertension   . Perirectal abscess   . Shingles 08/31/2017   2nd dx with shingles, per pt    Patient Active Problem List   Diagnosis Date Noted  . Visit for preventive health examination 08/11/2015  . Aortic regurgitation 05/29/2013  . Systolic ejection murmur 59/56/3875  . Carotid stenosis 01/26/2011  . BACK PAIN, LEFT 01/19/2009  . ALOPECIA 01/28/2008  . Obesity 10/21/2007  . Hyperlipidemia 07/24/2007  . Coronary atherosclerosis 07/24/2007  . GERD 07/24/2007  . POLYP, COLON 03/26/2007  . ANXIETY DEPRESSION 03/26/2007  . INSOMNIA, CHRONIC 03/26/2007  . HYPERTENSION, MILD 03/26/2007    Past Surgical History:  Procedure Laterality Date  . CESAREAN SECTION     x 2  . COLONOSCOPY W/ POLYPECTOMY  2007  . INCISION AND DRAINAGE ABSCESS ANAL    . PTCA with stent to third obtuse marginal      OB History    No data available       Home Medications    Prior to Admission medications   Medication Sig Start Date End Date Taking? Authorizing Provider  Acetylcarnitine HCl (ACETYL L-CARNITINE) 250 MG CAPS Take by mouth. 200 mg every other day    [provider]  albuterol  (PROVENTIL HFA;VENTOLIN HFA) 108 (90 Base) MCG/ACT inhaler Inhale 2 puffs into the lungs every 6 (six) hours as needed for wheezing or shortness of breath. 06/16/16   Nche, Charlene Brooke, NP  ALPRAZolam Duanne Moron) 0.25 MG tablet Take 1-1.5 tablets by mouth as needed at bedtime 04/11/17   Brunetta Jeans, PA-C  b complex vitamins tablet Take 1 tablet by mouth daily.      [provider]  Biotin 300 MCG TABS Take 600 mcg by mouth every other day.      [provider]  Calcium-Magnesium-Zinc 500-250-12.5 MG TABS Take 1 tablet by mouth every other day.      [provider]  Chromium Picolinate 200 MCG TABS Take 1 tablet by mouth every other day.      [provider]  clobetasol cream (TEMOVATE) 0.05 % APPLY A THIN LAYER TOPICALLY TO AFFECTED AREA(S) DAILY 06/30/15   [provider]  clopidogrel (PLAVIX) 75 MG tablet Take 1 tablet (75 mg total) by mouth daily. Needs office visit 06/04/17   Larey Dresser, MD  Coenzyme Q10 (COQ10) 200 MG CAPS Take 1 capsule by mouth daily.      [provider]  CRANBERRY JUICE EXTRACT PO Take 500 mg by mouth daily.      [provider]  cyclobenzaprine (FLEXERIL) 10 MG tablet Take  1 tablet (10 mg total) by mouth 3 (three) times daily as needed for muscle spasms. 08/30/17   Brunetta Jeans, PA-C  EPINEPHrine 0.3 mg/0.3 mL IJ SOAJ injection Inject 0.3 mLs (0.3 mg total) into the muscle once for 1 dose. 09/03/17 09/03/17  Ashley Murrain, NP  Flaxseed, Linseed, (FLAX SEED OIL) 1000 MG CAPS Take 1,200 mg by mouth daily.      [provider]  Garlic Oil 638 MG TABS Take 1 tablet by mouth daily.      [provider]  meloxicam (MOBIC) 15 MG tablet Take 1 tablet (15 mg total) by mouth daily. 08/31/17   Brunetta Jeans, PA-C  metoprolol tartrate (LOPRESSOR) 25 MG tablet Take 0.5 tablets (12.5 mg total) by mouth 2 (two) times daily. Needs office visit 06/04/17   Larey Dresser, MD  nitroGLYCERIN  (NITROSTAT) 0.4 MG SL tablet Place 1 tablet (0.4 mg total) under the tongue every 5 (five) minutes x 3 doses as needed for chest pain. 12/14/15   Larey Dresser, MD  omega-3 acid ethyl esters (LOVAZA) 1 g capsule Take 1 capsule (1 g total) by mouth daily. 06/04/17   Larey Dresser, MD  predniSONE (DELTASONE) 50 MG tablet Take one tablet PO daily 09/03/17   Ashley Murrain, NP  Probiotic Product (ALIGN PO) Take 1 capsule by mouth daily.    [provider]  ranitidine (ZANTAC) 150 MG tablet TAKE 1 TABLET BY MOUTH TWICE A DAY 07/02/17   Armbruster, Carlota Raspberry, MD  Rhodiola 300 MG CAPS Take 1 capsule by mouth daily.      [provider]  rosuvastatin (CRESTOR) 40 MG tablet Take 1 tablet (40 mg total) by mouth daily. Patient not taking: Reported on 08/30/2017 08/16/17   Larey Dresser, MD    Family History Family History  Problem Relation Age of Onset  . Skin cancer Mother   . Allergy (severe) Father   . Heart disease Father   . Leukemia Father        CML  . Renal Disease Paternal Grandfather        one removed   . Colon cancer Neg Hx     Social History Social History   Tobacco Use  . Smoking status: Former Smoker    Packs/day: 2.00    Years: 20.00    Pack years: 40.00    Types: Cigarettes    Last attempt to quit: 06/19/2006    Years since quitting: 11.2  . Smokeless tobacco: Never Used  Substance Use Topics  . Alcohol use: Yes    Alcohol/week: 0.0 oz    Comment: <2 monthly; rare  . Drug use: No     Allergies   Benzonatate; Chlorpromazine hcl; Codeine; Doxycycline; Hydrocodone; Hydrocodone-acetaminophen; Lovaza [omega-3-acid ethyl esters]; Penicillins; Prochlorperazine edisylate; Promethazine hcl; and Sulfonamide derivatives   Review of Systems Review of Systems  Constitutional: Negative for chills and fever.  HENT: Positive for sore throat and trouble swallowing.   Eyes: Negative for pain and redness.  Respiratory: Negative for shortness of breath.     Cardiovascular: Negative for chest pain.  Gastrointestinal: Negative for abdominal pain, nausea and vomiting.  Skin: Positive for rash (shingles).  Neurological: Negative for headaches.  Psychiatric/Behavioral: Negative for confusion.     Physical Exam Updated Vital Signs BP 101/74 (BP Location: Right Arm)   Pulse 66   Temp 98 F (36.7 C) (Oral)   Resp 16   Ht 5' 1.5" (1.562 m)  Wt 69.9 kg (154 lb)   SpO2 95%   BMI 28.63 kg/m   Physical Exam  Constitutional: She appears well-developed and well-nourished. No distress.  HENT:  Head: Normocephalic.  Mouth/Throat: Uvula is midline and mucous membranes are normal. No tonsillar abscesses. Posterior oropharyngeal edema: mild. Posterior oropharyngeal erythema: mild.  Uvula is swollen  Eyes: Conjunctivae and EOM are normal. Pupils are equal, round, and reactive to light.  Neck: Neck supple.  Cardiovascular: Normal rate and normal heart sounds.  Pulmonary/Chest: Effort normal and breath sounds normal.  Musculoskeletal: Normal range of motion.  Lymphadenopathy:    She has no cervical adenopathy.  Neurological: She is alert.  Skin: Skin is warm and dry.  Psychiatric: She has a normal mood and affect.  Nursing note and vitals reviewed.    ED Treatments / Results  Labs (all labs ordered are listed, but only abnormal results are displayed) Labs Reviewed - No data to display  Radiology No results found.  Procedures Procedures (including critical care time)  Medications Ordered in ED Medications  dexamethasone (DECADRON) tablet 10 mg (10 mg Oral Given 09/03/17 1336)  hydrOXYzine (ATARAX/VISTARIL) tablet 25 mg (25 mg Oral Given 09/03/17 1407)   Dr. Francia Greaves in to examine the patient   Initial Impression / Assessment and Plan / ED Course  I have reviewed the triage vital signs and the nursing notes. 67 y.o. female here with feeling of swelling of her throat and concerned about possible medication reaction stable for d/c  after treatment in the ED and patient stating symptoms have improved. Will treat with steroids and patient will take Benadryl. Patient also prescribed epi pen. She will f/u with her PCP or return here if symptoms worsen.   Final Clinical Impressions(s) / ED Diagnoses   Final diagnoses:  Allergic reaction, initial encounter    ED Discharge Orders        Ordered    predniSONE (DELTASONE) 50 MG tablet     09/03/17 1536    EPINEPHrine 0.3 mg/0.3 mL IJ SOAJ injection   Once     09/03/17 1536       Janit Bern Bellaire, NP 09/03/17 2201    Varney Biles, MD 09/04/17 1332

## 2017-09-03 NOTE — Telephone Encounter (Signed)
Pt states she has been experiencing uvula swelling for the past 30 min. Pt states she feels like she constantly has to swallow and states the glands in her neck feel swollen. Pt's husband unable to look in the pt's mouth to determine how swollen the uvula is at this time. Pt able to talk without difficulty and states she has still been able to swallow liquids, but has not tried anything solid or pills. Pt denies any difficulty breathing. Pt states she was recently started on Tramadol, Valacyclovir and Cyclobenzaprine. Pt advised to seek treatment in ED. Pt verbalized understanding.  Reason for Disposition . Swollen uvula  Answer Assessment - Initial Assessment Questions 1. ONSET: "When did the swelling start?" (e.g., minutes, hours, days)     In the last 30 min 2. LOCATION: "What is swollen?" (e.g., uvula)     Uvula 3. SEVERITY: "How swollen is it?"      Unsure, unable to tell even with husband looking in her mouth 4. CAUSE: "What do you think is causing the swelling?" (e.g., hx of angioedema, allergies)     ? medications 5. BREATHING DIFFICULTY: "Are you having difficulty breathing?"     No 6. OTHER SYMPTOMS: "Do you have any other symptoms?" (e.g., sore throat, gagging, drooling)     No  Protocols used: UVULA SWELLING-A-AH

## 2017-09-03 NOTE — Discharge Instructions (Signed)
Take the prednisone and keep the epi-pen with you at all times. Follow up with your primary care provider. Return for worsening symptoms.

## 2017-09-03 NOTE — Telephone Encounter (Signed)
Patient in the ER now. Will await notes from ER provider.

## 2017-09-03 NOTE — ED Triage Notes (Signed)
Pt c/o "swollen uvula" starting last night. Was dx with shingles 3/15 and was started on tramadol and valacyclovir. Has some difficulty swallowing pills this morning, but no difficulty swallowing water. Able to speak in full sentences during triage. Denies fever, cough, shortness of breath or chest pain. A/Ox4.

## 2017-09-04 ENCOUNTER — Telehealth: Payer: Self-pay | Admitting: Physician Assistant

## 2017-09-04 MED ORDER — GABAPENTIN 100 MG PO CAPS: 100.0000 mg | ORAL_CAPSULE | Freq: Two times a day (BID) | ORAL | 3 refills | Status: DC

## 2017-09-04 NOTE — Telephone Encounter (Signed)
Spoke with patient concerning Urgent Care visit for shingles and allergic reaction to either Valtrex or Tramadol requiring epi and prednisone. Is doing well but not sure what to do about shingles. Discussed need to continue cessation of this medications. Supportive measures reviewed. Rx Gabapentin sent int. Will f/u in 48 hours.

## 2017-09-04 NOTE — Telephone Encounter (Signed)
Copied from [place]. Topic: Quick Communication - See Telephone Encounter >> Sep 04, 2017  1:38 PM Robina Ade, Helene Kelp D wrote: CRM for notification. See Telephone encounter for: 09/04/17. Patient was seen at the ED yesterday and wants to talk to [facility] about her visit and medicine that they gave her. She would like to talk to him before making an appt. She is not sure if she needs to continue taking her shingle meds or stop it because she is not sure what gave her a reaction. She was triage yesterday and sent to the ED.

## 2017-09-05 NOTE — Telephone Encounter (Signed)
Patient was given Valtrex by UC. Was seen in ER due to uvula/tongue swelling thought to be related to Valtrex or Tramadol. I am very hesitant to give her another antiviral medication due to risk of reaction. She is also outside of the window where starting another antiviral medication would be beneficial. Would recommend continue the Gabapentin, if tolerating well, increase to 1 capsule three times daily.

## 2017-09-05 NOTE — Telephone Encounter (Signed)
I have reviewed the notes below with patient. She stated verbal understanding.

## 2017-09-05 NOTE — Telephone Encounter (Signed)
Patient calling back regarding valtrex, would like to try another antiviral med. Please advise Call back [phone]

## 2017-09-07 NOTE — Telephone Encounter (Signed)
Patient said she took a gabapentin at 9:50 and its not touching the pain. She wants to know what else she can do? She said she sitting on an ice pack also. She said that she is only allowed to take 2 a day. Please call pt at [phone]

## 2017-09-07 NOTE — Telephone Encounter (Signed)
I have discussed the notes below at length with patient.  She stated verbal understanding and is aware how to take medication.  She is aware to also use tylenol and lidocaine patch or cream to help.   Aware that if she has any side effects to call and let us know.   Stated verbal understanding and was thankful for the help.

## 2017-09-07 NOTE — Telephone Encounter (Signed)
If she tolerated first dose well, then at next dose between 12 and 1, can take 300 mg. Can repeat this evening. If tolerating well, we will want to continue 300 mg TID to help with the neuropathic pain. We are somewhat limited with pain medication giving recent potential reaction to Tramadol. She also gets nausea and vomiting with hydrocodone. She can take Tylenol along with the Gabapentin. Also recommend she get OTC lidocaine patch or cream to apply to the area to numb those overactive nerves.

## 2017-09-07 NOTE — Telephone Encounter (Signed)
Patient states that she just started taking the Gabapentin and she took the first one at 9:50 this morning and "it's not touching my pain" - she know she can take this 3 times daily, but she is asking how long she has to wait in between pills.  States that she is in a lot of pain today and just really needs relief.  Routing to PCP to advise.

## 2017-09-08 ENCOUNTER — Telehealth: Payer: Self-pay | Admitting: Family Medicine

## 2017-09-08 DIAGNOSIS — B029 Zoster without complications: Secondary | ICD-10-CM | POA: Diagnosis not present

## 2017-09-08 NOTE — Telephone Encounter (Signed)
Received after-hours call while at [facility]. RN wanted "guidance" re: medications. I was told that the patient was diagnosed with back pain by PCP, prescribed medications. She then went to UC when rash developed, diagnosed with shingles and prescribed medications. Subsequent allergic reaction, went to ER. Meds changed again. Now, with severe pain, so went to UC today.  The RN stated that the UC "doctor" wanted to know what medications that they should give to patient. I informed the RN that I could not recommend medications without seeing the patient and reviewing her chart, that the patient should be refunded and go to a different facility. If the provider is a NP/PA, that person should call their supervising physician.   Will route to PCP for FYI.

## 2017-09-09 ENCOUNTER — Encounter (HOSPITAL_COMMUNITY): Payer: Self-pay | Admitting: *Deleted

## 2017-09-09 ENCOUNTER — Emergency Department (HOSPITAL_COMMUNITY)
Admission: EM | Admit: 2017-09-09 | Discharge: 2017-09-09 | Disposition: A | Discharge status: Home / Self Care | Payer: Medicare Other | Attending: Emergency Medicine | Admitting: Emergency Medicine

## 2017-09-09 ENCOUNTER — Other Ambulatory Visit: Payer: Self-pay

## 2017-09-09 DIAGNOSIS — Z87891 Personal history of nicotine dependence: Secondary | ICD-10-CM | POA: Insufficient documentation

## 2017-09-09 DIAGNOSIS — Z7902 Long term (current) use of antithrombotics/antiplatelets: Secondary | ICD-10-CM | POA: Diagnosis not present

## 2017-09-09 DIAGNOSIS — B029 Zoster without complications: Secondary | ICD-10-CM | POA: Diagnosis not present

## 2017-09-09 DIAGNOSIS — Z79899 Other long term (current) drug therapy: Secondary | ICD-10-CM | POA: Diagnosis not present

## 2017-09-09 DIAGNOSIS — I2510 Atherosclerotic heart disease of native coronary artery without angina pectoris: Secondary | ICD-10-CM | POA: Insufficient documentation

## 2017-09-09 DIAGNOSIS — I10 Essential (primary) hypertension: Secondary | ICD-10-CM | POA: Diagnosis not present

## 2017-09-09 DIAGNOSIS — R109 Unspecified abdominal pain: Secondary | ICD-10-CM | POA: Diagnosis present

## 2017-09-09 MED ORDER — TRAMADOL HCL 50 MG PO TABS: 100.0000 mg | ORAL_TABLET | Freq: Four times a day (QID) | ORAL | 0 refills | Status: DC | PRN

## 2017-09-09 NOTE — ED Triage Notes (Signed)
Dx with shingles on 3/15, has been seen several times. On gabapentin and Tramadol for treatment, has back pain, and headache, shingles raised but not weeping or open. Pt states she thinks she has been dehydrated.

## 2017-09-09 NOTE — ED Provider Notes (Signed)
[facility] DEPT Provider Note   CSN: [id number] Arrival date & time: 09/09/17  2683    She presents for evaluation of left flank pain, present for 2 weeks initially without known trauma, later developed a rash, and was diagnosed with shingles.  She was treated with Valtrex, but had a reaction for it and was seen in the ED where she was treated for allergy.  She took 3 days of Valtrex, prior to stopping it because of the allergy.  She was treated with gabapentin and tramadol for pain, but has persistent pain that is unrelieved.  She denies fever, chills, nausea, vomiting, weakness or dizziness.  She feels that she got dehydrated while taking gabapentin, but is now eating and drinking normally.  No other complaints or known modifying factors at this time.   History   Chief Complaint Chief Complaint  Patient presents with  . Back Pain  . Herpes Zoster    HPI Emily [name] is a 67 y.o. female.  HPI  Past Medical History:  Diagnosis Date  . Anxiety   . CAD (coronary artery disease)   . Chronic insomnia   . Colon polyp   . Depression   . GERD with esophagitis   . Heart attack ([place]) 1-09  . Hyperlipemia   . Hypertension   . Perirectal abscess   . Shingles 08/31/2017   2nd dx with shingles, per pt    Patient Active Problem List   Diagnosis Date Noted  . Visit for preventive health examination 08/11/2015  . Aortic regurgitation 05/29/2013  . Systolic ejection murmur 41/96/2229  . Carotid stenosis 01/26/2011  . BACK PAIN, LEFT 01/19/2009  . ALOPECIA 01/28/2008  . Obesity 10/21/2007  . Hyperlipidemia 07/24/2007  . Coronary atherosclerosis 07/24/2007  . GERD 07/24/2007  . POLYP, COLON 03/26/2007  . ANXIETY DEPRESSION 03/26/2007  . INSOMNIA, CHRONIC 03/26/2007  . HYPERTENSION, MILD 03/26/2007    Past Surgical History:  Procedure Laterality Date  . CESAREAN SECTION     x 2  . COLONOSCOPY W/ POLYPECTOMY  2007  . INCISION AND DRAINAGE  ABSCESS ANAL    . PTCA with stent to third obtuse marginal       OB History   None      Home Medications    Prior to Admission medications   Medication Sig Start Date End Date Taking? Authorizing Provider  Acetylcarnitine HCl (ACETYL L-CARNITINE) 250 MG CAPS Take by mouth. 200 mg every other day    [provider]  albuterol (PROVENTIL HFA;VENTOLIN HFA) 108 (90 Base) MCG/ACT inhaler Inhale 2 puffs into the lungs every 6 (six) hours as needed for wheezing or shortness of breath. 06/16/16   Nche, Charlene Brooke, NP  ALPRAZolam Duanne Moron) 0.25 MG tablet Take 1-1.5 tablets by mouth as needed at bedtime 04/11/17   Brunetta Jeans, PA-C  b complex vitamins tablet Take 1 tablet by mouth daily.      [provider]  Biotin 300 MCG TABS Take 600 mcg by mouth every other day.      [provider]  Calcium-Magnesium-Zinc 500-250-12.5 MG TABS Take 1 tablet by mouth every other day.      [provider]  Chromium Picolinate 200 MCG TABS Take 1 tablet by mouth every other day.      [provider]  clobetasol cream (TEMOVATE) 0.05 % APPLY A THIN LAYER TOPICALLY TO AFFECTED AREA(S) DAILY 06/30/15   [provider]  clopidogrel (PLAVIX) 75 MG tablet Take 1 tablet (  75 mg total) by mouth daily. Needs office visit 06/04/17   Larey Dresser, MD  Coenzyme Q10 (COQ10) 200 MG CAPS Take 1 capsule by mouth daily.      [provider]  CRANBERRY JUICE EXTRACT PO Take 500 mg by mouth daily.      [provider]  cyclobenzaprine (FLEXERIL) 10 MG tablet Take 1 tablet (10 mg total) by mouth 3 (three) times daily as needed for muscle spasms. 08/30/17   Brunetta Jeans, PA-C  Flaxseed, Linseed, (FLAX SEED OIL) 1000 MG CAPS Take 1,200 mg by mouth daily.      [provider]  gabapentin (NEURONTIN) 100 MG capsule Take 1 capsule (100 mg total) by mouth 2 (two) times daily. 09/04/17   Brunetta Jeans, PA-C  Garlic Oil 676 MG TABS Take 1 tablet  by mouth daily.      [provider]  metoprolol tartrate (LOPRESSOR) 25 MG tablet Take 0.5 tablets (12.5 mg total) by mouth 2 (two) times daily. Needs office visit 06/04/17   Larey Dresser, MD  nitroGLYCERIN (NITROSTAT) 0.4 MG SL tablet Place 1 tablet (0.4 mg total) under the tongue every 5 (five) minutes x 3 doses as needed for chest pain. 12/14/15   Larey Dresser, MD  omega-3 acid ethyl esters (LOVAZA) 1 g capsule Take 1 capsule (1 g total) by mouth daily. 06/04/17   Larey Dresser, MD  predniSONE (DELTASONE) 50 MG tablet Take one tablet PO daily 09/03/17   Ashley Murrain, NP  Probiotic Product (ALIGN PO) Take 1 capsule by mouth daily.    [provider]  ranitidine (ZANTAC) 150 MG tablet TAKE 1 TABLET BY MOUTH TWICE A DAY 07/02/17   Armbruster, Carlota Raspberry, MD  Rhodiola 300 MG CAPS Take 1 capsule by mouth daily.      [provider]  rosuvastatin (CRESTOR) 40 MG tablet Take 1 tablet (40 mg total) by mouth daily. Patient not taking: Reported on 08/30/2017 08/16/17   Larey Dresser, MD  traMADol Veatrice Bourbon) 50 MG tablet Take 2 tablets (100 mg total) by mouth every 6 (six) hours as needed for moderate pain. 09/09/17   Daleen Bo, MD    Family History Family History  Problem Relation Age of Onset  . Skin cancer Mother   . Allergy (severe) Father   . Heart disease Father   . Leukemia Father        CML  . Renal Disease Paternal Grandfather        one removed   . Colon cancer Neg Hx     Social History Social History   Tobacco Use  . Smoking status: Former Smoker    Packs/day: 2.00    Years: 20.00    Pack years: 40.00    Types: Cigarettes    Last attempt to quit: 06/19/2006    Years since quitting: 11.2  . Smokeless tobacco: Never Used  Substance Use Topics  . Alcohol use: Yes    Alcohol/week: 0.0 oz    Comment: <2 monthly; rare  . Drug use: No     Allergies   Benzonatate; Chlorpromazine hcl; Codeine; Doxycycline; Hydrocodone;  Hydrocodone-acetaminophen; Lovaza [omega-3-acid ethyl esters]; Penicillins; Prochlorperazine edisylate; Promethazine hcl; and Sulfonamide derivatives   Review of Systems Review of Systems  All other systems reviewed and are negative.    Physical Exam Updated Vital Signs BP (!) 141/82 (BP Location: Right Arm)   Pulse 62   Temp 97.8 F (36.6 C) (Oral)  Resp 18   Ht 5' 1.5" (1.562 m)   Wt 69.9 kg (154 lb)   SpO2 95%   BMI 28.63 kg/m   Physical Exam  Constitutional: She is oriented to person, place, and time. She appears well-developed and well-nourished.  HENT:  Head: Normocephalic and atraumatic.  Eyes: Pupils are equal, round, and reactive to light. Conjunctivae and EOM are normal.  Neck: Normal range of motion and phonation normal. Neck supple.  Cardiovascular: Normal rate.  Pulmonary/Chest: Effort normal. She exhibits no tenderness.  Musculoskeletal: Normal range of motion.  Neurological: She is alert and oriented to person, place, and time. She exhibits normal muscle tone.  Skin: Skin is warm and dry.  Coalescing red rash left T4-5, extending posterior to anterior, without vesicles, drainage, or fluctuance at this time.  Psychiatric: She has a normal mood and affect. Her behavior is normal. Judgment and thought content normal.  Nursing note and vitals reviewed.    ED Treatments / Results  Labs (all labs ordered are listed, but only abnormal results are displayed) Labs Reviewed - No data to display  EKG None  Radiology No results found.  Procedures Procedures (including critical care time)  Medications Ordered in ED Medications - No data to display   Initial Impression / Assessment and Plan / ED Course  I have reviewed the triage vital signs and the nursing notes.  Pertinent labs & imaging results that were available during my care of the patient were reviewed by me and considered in my medical decision making (see chart for details).      Patient  Vitals for the past 24 hrs:  BP Temp Temp src Pulse Resp SpO2 Height Weight  09/09/17 1104 (!) 141/82 97.8 F (36.6 C) Oral 62 18 95 % - -  09/09/17 0843 - - - - - - 5' 1.5" (1.562 m) 69.9 kg (154 lb)  09/09/17 0841 (!) 187/84 97.8 F (36.6 C) Oral 75 16 98 % - -    At discharge- reevaluation with update and discussion. After initial assessment and treatment, an updated evaluation reveals she remains comfortable has no further complaints. Daleen Bo   MDM-shingles without complications other than prolonged pain.  Doubt pneumonia, spinal radiculopathy, or serious bacterial infection.  Nursing Notes Reviewed/ Care Coordinated Applicable Imaging Reviewed Interpretation of Laboratory Data incorporated into ED treatment  The patient appears reasonably screened and/or stabilized for discharge and I doubt any other medical condition or other [facility] requiring further screening, evaluation, or treatment in the ED at this time prior to discharge.  Plan: Home Medications-continue current medications, increase tramadol to 100 mg every 6 hours; Home Treatments-rest, wound care for shingles; return here if the recommended treatment, does not improve the symptoms; Recommended follow up-PCP, as needed    Final Clinical Impressions(s) / ED Diagnoses   Final diagnoses:  Herpes zoster without complication    ED Discharge Orders        Ordered    traMADol (ULTRAM) 50 MG tablet  Every 6 hours PRN     09/09/17 1102       Daleen Bo, MD 09/09/17 1601

## 2017-09-09 NOTE — Discharge Instructions (Addendum)
See your doctor as needed for problems  Take tramadol 100 mg every 6 hours as needed for pain

## 2017-09-11 ENCOUNTER — Other Ambulatory Visit: Payer: Self-pay | Admitting: Gastroenterology

## 2017-09-12 ENCOUNTER — Other Ambulatory Visit (HOSPITAL_COMMUNITY): Payer: Self-pay | Admitting: *Deleted

## 2017-09-12 ENCOUNTER — Telehealth: Payer: Self-pay | Admitting: *Deleted

## 2017-09-12 ENCOUNTER — Other Ambulatory Visit: Payer: Self-pay | Admitting: Gastroenterology

## 2017-09-12 MED ORDER — METOPROLOL TARTRATE 25 MG PO TABS: 12.5000 mg | ORAL_TABLET | Freq: Two times a day (BID) | ORAL | 3 refills | Status: DC

## 2017-09-12 MED ORDER — RANITIDINE HCL 150 MG PO TABS: 150.0000 mg | ORAL_TABLET | Freq: Two times a day (BID) | ORAL | 0 refills | Status: DC

## 2017-09-12 NOTE — Telephone Encounter (Signed)
Patient has been informed of notes below. Stated verbal understanding.  She is going to try these suggestions.  If she is not better by Friday she will call to schedule an appointment so that she does not have to go all weekend and end up in the ER again.    Patient will call if she needs anything further.

## 2017-09-12 NOTE — Telephone Encounter (Signed)
I encourage her to increase hydration and the amount of fiber in your diet.  Start a daily probiotic (Align, Culturelle, Digestive Advantage, etc.). Take 2 Tbs of Milk of Magnesia in a 4 oz glass of warmed prune juice every 2-3 days to help promote bowel movement.Do this along with the Dulcolax to help with bowel movement.   Once she cuts back on the Tramadol things should rectify themselves.   Follow-up in office if not improving with the recommendations below.

## 2017-09-12 NOTE — Telephone Encounter (Signed)
Rx sent for 90 day supply.

## 2017-09-12 NOTE — Telephone Encounter (Signed)
Patient states that she was in so much pain over the weekend and the Gabapentin was "worthless"   She had no relief (even taking 3 tablets 3 times daily).  She went to the ER on Sunday and they put her back on Tramadol.  She is now so constipated that she "cannot stand it"  She is taking Dulcolax "as much as the bottle will allow" and she has only had a very small bowel movement and has not been able to go since.  She states that she feels like she has to go and cannot.  She is asking for advice on what to do.    Routed to PCP to advise.

## 2017-09-13 ENCOUNTER — Encounter: Payer: Self-pay | Admitting: Physician Assistant

## 2017-09-13 ENCOUNTER — Ambulatory Visit (INDEPENDENT_AMBULATORY_CARE_PROVIDER_SITE_OTHER): Payer: Medicare Other | Admitting: Physician Assistant

## 2017-09-13 ENCOUNTER — Other Ambulatory Visit: Payer: Self-pay

## 2017-09-13 VITALS — BP 122/72 | HR 62 | Temp 97.7°F | Resp 16 | Ht 62.0 in | Wt 150.0 lb

## 2017-09-13 DIAGNOSIS — B029 Zoster without complications: Secondary | ICD-10-CM | POA: Diagnosis not present

## 2017-09-13 DIAGNOSIS — I2510 Atherosclerotic heart disease of native coronary artery without angina pectoris: Secondary | ICD-10-CM

## 2017-09-13 MED ORDER — ALPRAZOLAM 0.25 MG PO TABS: ORAL_TABLET | ORAL | 1 refills | Status: DC

## 2017-09-13 NOTE — Patient Instructions (Signed)
Please continue to keep the rash covered.  It has mostly scabbed over but there is a residual blister underneath the breast. Once all blisters have scabbed over, this is not contagious anymore.  You want to avoid infants and pregnant women as much as possible to avoid passing on chicken pox to a small child or fetus.   Continue Tramadol for pain. Follow the bowel regimen given if you note constipation.  I am so sorry for your loss! My thoughts and prayers are with you and your family during this time.

## 2017-09-13 NOTE — Progress Notes (Signed)
Patient presents to clinic today for follow-up of shingles. Patient is keeping pain under control with Tramadol. Initially noted constipation that is improving now that she is weaning off of medication. Denies any new lesions. Notes the rash on her back is scabbed over now. Her step mother died last night and they have to leave town to take care of things. Will be around family and wants to discuss measures to prevent transmission.   Past Medical History:  Diagnosis Date  . Anxiety   . CAD (coronary artery disease)   . Chronic insomnia   . Colon polyp   . Depression   . GERD with esophagitis   . Heart attack ([place]) 1-09  . Hyperlipemia   . Hypertension   . Perirectal abscess   . Shingles 08/31/2017   2nd dx with shingles, per pt    Current Outpatient Medications on File Prior to Visit  Medication Sig Dispense Refill  . Acetylcarnitine HCl (ACETYL L-CARNITINE) 250 MG CAPS Take by mouth. 200 mg every other day    . albuterol (PROVENTIL HFA;VENTOLIN HFA) 108 (90 Base) MCG/ACT inhaler Inhale 2 puffs into the lungs every 6 (six) hours as needed for wheezing or shortness of breath. 1 Inhaler 0  . b complex vitamins tablet Take 1 tablet by mouth daily.      . Biotin 300 MCG TABS Take 600 mcg by mouth every other day.      . Calcium-Magnesium-Zinc 500-250-12.5 MG TABS Take 1 tablet by mouth every other day.      . Chromium Picolinate 200 MCG TABS Take 1 tablet by mouth every other day.      . clobetasol cream (TEMOVATE) 0.05 % APPLY A THIN LAYER TOPICALLY TO AFFECTED AREA(S) DAILY  0  . clopidogrel (PLAVIX) 75 MG tablet Take 1 tablet (75 mg total) by mouth daily. Needs office visit 90 tablet 3  . Coenzyme Q10 (COQ10) 200 MG CAPS Take 1 capsule by mouth daily.      Marland Kitchen CRANBERRY JUICE EXTRACT PO Take 500 mg by mouth daily.      . cyclobenzaprine (FLEXERIL) 10 MG tablet Take 1 tablet (10 mg total) by mouth 3 (three) times daily as needed for muscle spasms. 30 tablet 0  . EPINEPHrine 0.3 mg/0.3  mL IJ SOAJ injection INJECT 0.3 MLS INTO THE MUSCLE ONCE FOR 1 DOSE  0  . Flaxseed, Linseed, (FLAX SEED OIL) 1000 MG CAPS Take 1,200 mg by mouth daily.      . Garlic Oil 485 MG TABS Take 1 tablet by mouth daily.      . metoprolol tartrate (LOPRESSOR) 25 MG tablet Take 0.5 tablets (12.5 mg total) by mouth 2 (two) times daily. 90 tablet 3  . nitroGLYCERIN (NITROSTAT) 0.4 MG SL tablet Place 1 tablet (0.4 mg total) under the tongue every 5 (five) minutes x 3 doses as needed for chest pain. 25 tablet 3  . omega-3 acid ethyl esters (LOVAZA) 1 g capsule Take 1 capsule (1 g total) by mouth daily. 90 capsule 3  . predniSONE (DELTASONE) 50 MG tablet Take one tablet PO daily 3 tablet 0  . Probiotic Product (ALIGN PO) Take 1 capsule by mouth daily.    . ranitidine (ZANTAC) 150 MG tablet Take 1 tablet (150 mg total) by mouth 2 (two) times daily. PATIENT MUST HAVE OFFICE VISIT FOR ANY FURTHER REFILLS!!! 180 tablet 0  . Rhodiola 300 MG CAPS Take 1 capsule by mouth daily.      . traMADol Veatrice Bourbon)  50 MG tablet Take 2 tablets (100 mg total) by mouth every 6 (six) hours as needed for moderate pain. 20 tablet 0  . predniSONE (STERAPRED UNI-PAK 21 TAB) 10 MG (21) TBPK tablet prednisone 10 mg tablets in a dose pack  as directed    . rosuvastatin (CRESTOR) 40 MG tablet Take 1 tablet (40 mg total) by mouth daily. (Patient not taking: Reported on 09/13/2017) 30 tablet 3   No current facility-administered medications on file prior to visit.     Allergies  Allergen Reactions  . Benzonatate     Unknown, patient states "it made me sick"  . Chlorpromazine Hcl     unknown  . Codeine     REACTION: causes vomiting  . Doxycycline     "explosive diarrhea" -does not wish to take this anymore  . Hydrocodone     REACTION: causes vomiing and nausea  . Hydrocodone-Acetaminophen Nausea And Vomiting  . Lovaza [Omega-3-Acid Ethyl Esters] Other (See Comments)    Loose stools with two capsules  . Penicillins     REACTION:  causes rash  . Prochlorperazine Edisylate     unknown   . Promethazine Hcl     unknown  . Sulfonamide Derivatives     unknown     Family History  Problem Relation Age of Onset  . Skin cancer Mother   . Allergy (severe) Father   . Heart disease Father   . Leukemia Father        CML  . Renal Disease Paternal Grandfather        one removed   . Colon cancer Neg Hx     Social History   Socioeconomic History  . Marital status: Married    Spouse name: Not on file  . Number of children: 2  . Years of education: Not on file  . Highest education level: Not on file  Occupational History  . Occupation: Risk manager  Social Needs  . Financial resource strain: Not on file  . Food insecurity:    Worry: Not on file    Inability: Not on file  . Transportation needs:    Medical: Not on file    Non-medical: Not on file  Tobacco Use  . Smoking status: Former Smoker    Packs/day: 2.00    Years: 20.00    Pack years: 40.00    Types: Cigarettes    Last attempt to quit: 06/19/2006    Years since quitting: 11.2  . Smokeless tobacco: Never Used  Substance and Sexual Activity  . Alcohol use: Yes    Alcohol/week: 0.0 oz    Comment: <2 monthly; rare  . Drug use: No  . Sexual activity: Not on file  Lifestyle  . Physical activity:    Days per week: Not on file    Minutes per session: Not on file  . Stress: Not on file  Relationships  . Social connections:    Talks on phone: Not on file    Gets together: Not on file    Attends religious service: Not on file    Active member of club or organization: Not on file    Attends meetings of clubs or organizations: Not on file    Relationship status: Not on file  Other Topics Concern  . Not on file  Social History Narrative   Resides in [place] with her husband.    Husband works in [place] and comes home two to three times a year   2 children  who go to [employer]   Just got accepted to [employer]   Exercise- NO   Review of Systems - See  HPI.  All other ROS are negative.  BP 122/72   Pulse 62   Temp 97.7 F (36.5 C) (Oral)   Resp 16   Ht 5\' 2"  (1.575 m)   Wt 150 lb (68 kg)   SpO2 97%   BMI 27.44 kg/m   Physical Exam  Constitutional: She is oriented to person, place, and time and well-developed, well-nourished, and in no distress.  HENT:  Head: Normocephalic and atraumatic.  Eyes: Conjunctivae are normal.  Cardiovascular: Normal rate, regular rhythm, normal heart sounds and intact distal pulses.  Pulmonary/Chest: Effort normal and breath sounds normal. No respiratory distress. She has no wheezes. She has no rales. She exhibits no tenderness.  Neurological: She is alert and oriented to person, place, and time.  Skin: Skin is warm and dry.     Psychiatric: Affect normal.  Vitals reviewed.  Recent Results (from the past 2160 hour(s))  Hepatic function panel     Status: Abnormal   Collection Time: 07/02/17 11:01 AM  Result Value Ref Range   Total Protein 6.4 (L) 6.5 - 8.1 g/dL   Albumin 3.7 3.5 - 5.0 g/dL   AST 23 15 - 41 U/L   ALT 20 14 - 54 U/L   Alkaline Phosphatase 88 38 - 126 U/L   Total Bilirubin 0.8 0.3 - 1.2 mg/dL   Bilirubin, Direct 0.1 0.1 - 0.5 mg/dL   Indirect Bilirubin 0.7 0.3 - 0.9 mg/dL  Lipid Profile     Status: Abnormal   Collection Time: 07/02/17 11:01 AM  Result Value Ref Range   Cholesterol 184 0 - 200 mg/dL   Triglycerides 166 (H) <150 mg/dL   HDL 50 >40 mg/dL   Total CHOL/HDL Ratio 3.7 RATIO   VLDL 33 0 - 40 mg/dL   LDL Cholesterol 101 (H) 0 - 99 mg/dL    Comment:        Total Cholesterol/HDL:CHD Risk Coronary Heart Disease Risk Table                     Men   Women  1/2 Average Risk   3.4   3.3  Average Risk       5.0   4.4  2 X Average Risk   9.6   7.1  3 X Average Risk  23.4   11.0        Use the calculated Patient Ratio above and the CHD Risk Table to determine the patient's CHD Risk.        ATP III CLASSIFICATION (LDL):  <100     mg/dL   Optimal  100-129  mg/dL    Near or Above                    Optimal  130-159  mg/dL   Borderline  160-189  mg/dL   High  >190     mg/dL   Very High   POCT Urinalysis Dipstick     Status: Abnormal   Collection Time: 08/30/17 10:36 AM  Result Value Ref Range   Color, UA yellow    Clarity, UA clear    Glucose, UA negative    Bilirubin, UA negative    Ketones, UA negative    Spec Grav, UA >=1.030 (A) 1.010 - 1.025   Blood, UA negative    pH, UA 5.0 5.0 -  8.0   Protein, UA trace    Urobilinogen, UA 0.2 0.2 or 1.0 E.U./dL   Nitrite, UA negative    Leukocytes, UA Negative Negative   Appearance     Odor      Assessment/Plan: 1. Herpes zoster without complication Healing. Most lesions are crusted over. There is one remnant blister underneath breast. Discussed avoidance of small children, pregnant women and elderly. Once all blisters are crusted over it should not be contagious anymore. Needs to keep covered well until that time. Continue Tramadol PRN. Can transition to tylenol or motrin as this improves. Bowel regimen reviewed.    Leeanne Rio, PA-C

## 2017-09-19 ENCOUNTER — Other Ambulatory Visit: Payer: Self-pay

## 2017-09-19 ENCOUNTER — Encounter: Payer: Self-pay | Admitting: Physician Assistant

## 2017-09-19 ENCOUNTER — Ambulatory Visit (INDEPENDENT_AMBULATORY_CARE_PROVIDER_SITE_OTHER): Payer: Medicare Other | Admitting: Physician Assistant

## 2017-09-19 VITALS — BP 110/70 | HR 67 | Temp 97.6°F | Resp 16 | Ht 62.0 in | Wt 149.0 lb

## 2017-09-19 DIAGNOSIS — I2510 Atherosclerotic heart disease of native coronary artery without angina pectoris: Secondary | ICD-10-CM | POA: Diagnosis not present

## 2017-09-19 DIAGNOSIS — B029 Zoster without complications: Secondary | ICD-10-CM

## 2017-09-19 NOTE — Progress Notes (Signed)
Patient presents to clinic today for follow-up of shingles. Patient notes that rash is resolving but some pain is still present. Denies any new lesions. Denies fever, chills, malaise.   Past Medical History:  Diagnosis Date  . Anxiety   . CAD (coronary artery disease)   . Chronic insomnia   . Colon polyp   . Depression   . GERD with esophagitis   . Heart attack ([place]) 1-09  . Hyperlipemia   . Hypertension   . Perirectal abscess   . Shingles 08/31/2017   2nd dx with shingles, per pt    Current Outpatient Medications on File Prior to Visit  Medication Sig Dispense Refill  . acetaminophen (TYLENOL) 650 MG CR tablet Take 650 mg by mouth every 8 (eight) hours as needed for pain.    . Acetylcarnitine HCl (ACETYL L-CARNITINE) 250 MG CAPS Take by mouth. 200 mg every other day    . albuterol (PROVENTIL HFA;VENTOLIN HFA) 108 (90 Base) MCG/ACT inhaler Inhale 2 puffs into the lungs every 6 (six) hours as needed for wheezing or shortness of breath. 1 Inhaler 0  . ALPRAZolam (XANAX) 0.25 MG tablet Take 1-1.5 tablets by mouth as needed at bedtime 15 tablet 1  . b complex vitamins tablet Take 1 tablet by mouth daily.      . Biotin 300 MCG TABS Take 600 mcg by mouth every other day.      . Calcium-Magnesium-Zinc 500-250-12.5 MG TABS Take 1 tablet by mouth every other day.      . Chromium Picolinate 200 MCG TABS Take 1 tablet by mouth every other day.      . clobetasol cream (TEMOVATE) 0.05 % APPLY A THIN LAYER TOPICALLY TO AFFECTED AREA(S) DAILY  0  . clopidogrel (PLAVIX) 75 MG tablet Take 1 tablet (75 mg total) by mouth daily. Needs office visit 90 tablet 3  . Coenzyme Q10 (COQ10) 200 MG CAPS Take 1 capsule by mouth daily.      Marland Kitchen CRANBERRY JUICE EXTRACT PO Take 500 mg by mouth daily.      . cyclobenzaprine (FLEXERIL) 10 MG tablet Take 1 tablet (10 mg total) by mouth 3 (three) times daily as needed for muscle spasms. 30 tablet 0  . EPINEPHrine 0.3 mg/0.3 mL IJ SOAJ injection INJECT 0.3 MLS INTO  THE MUSCLE ONCE FOR 1 DOSE  0  . Flaxseed, Linseed, (FLAX SEED OIL) 1000 MG CAPS Take 1,200 mg by mouth daily.      . Garlic Oil 329 MG TABS Take 1 tablet by mouth daily.      . metoprolol tartrate (LOPRESSOR) 25 MG tablet Take 0.5 tablets (12.5 mg total) by mouth 2 (two) times daily. 90 tablet 3  . nitroGLYCERIN (NITROSTAT) 0.4 MG SL tablet Place 1 tablet (0.4 mg total) under the tongue every 5 (five) minutes x 3 doses as needed for chest pain. 25 tablet 3  . omega-3 acid ethyl esters (LOVAZA) 1 g capsule Take 1 capsule (1 g total) by mouth daily. 90 capsule 3  . Probiotic Product (ALIGN PO) Take 1 capsule by mouth daily.    . ranitidine (ZANTAC) 150 MG tablet Take 1 tablet (150 mg total) by mouth 2 (two) times daily. PATIENT MUST HAVE OFFICE VISIT FOR ANY FURTHER REFILLS!!! 180 tablet 0  . Rhodiola 300 MG CAPS Take 1 capsule by mouth daily.      . rosuvastatin (CRESTOR) 40 MG tablet Take 1 tablet (40 mg total) by mouth daily. (Patient not taking: Reported on 09/19/2017)  30 tablet 3   No current facility-administered medications on file prior to visit.     Allergies  Allergen Reactions  . Benzonatate     Unknown, patient states "it made me sick"  . Chlorpromazine Hcl     unknown  . Codeine     REACTION: causes vomiting  . Doxycycline     "explosive diarrhea" -does not wish to take this anymore  . Hydrocodone     REACTION: causes vomiing and nausea  . Hydrocodone-Acetaminophen Nausea And Vomiting  . Lovaza [Omega-3-Acid Ethyl Esters] Other (See Comments)    Loose stools with two capsules  . Penicillins     REACTION: causes rash  . Prochlorperazine Edisylate     unknown   . Promethazine Hcl     unknown  . Sulfonamide Derivatives     unknown     Family History  Problem Relation Age of Onset  . Skin cancer Mother   . Allergy (severe) Father   . Heart disease Father   . Leukemia Father        CML  . Renal Disease Paternal Grandfather        one removed   . Colon cancer Neg  Hx     Social History   Socioeconomic History  . Marital status: Married    Spouse name: Not on file  . Number of children: 2  . Years of education: Not on file  . Highest education level: Not on file  Occupational History  . Occupation: Risk manager  Social Needs  . Financial resource strain: Not on file  . Food insecurity:    Worry: Not on file    Inability: Not on file  . Transportation needs:    Medical: Not on file    Non-medical: Not on file  Tobacco Use  . Smoking status: Former Smoker    Packs/day: 2.00    Years: 20.00    Pack years: 40.00    Types: Cigarettes    Last attempt to quit: 06/19/2006    Years since quitting: 11.2  . Smokeless tobacco: Never Used  Substance and Sexual Activity  . Alcohol use: Yes    Alcohol/week: 0.0 oz    Comment: <2 monthly; rare  . Drug use: No  . Sexual activity: Not on file  Lifestyle  . Physical activity:    Days per week: Not on file    Minutes per session: Not on file  . Stress: Not on file  Relationships  . Social connections:    Talks on phone: Not on file    Gets together: Not on file    Attends religious service: Not on file    Active member of club or organization: Not on file    Attends meetings of clubs or organizations: Not on file    Relationship status: Not on file  Other Topics Concern  . Not on file  Social History Narrative   Resides in [place] with her husband.    Husband works in [place] and comes home two to three times a year   2 children who go to [place]   Just got accepted to [employer]   Exercise- NO   Review of Systems - See HPI.  All other ROS are negative.  BP 110/70   Pulse 67   Temp 97.6 F (36.4 C) (Oral)   Resp 16   Ht 5\' 2"  (1.575 m)   Wt 149 lb (67.6 kg)   SpO2 98%   BMI 27.25  kg/m   Physical Exam  Constitutional: She is oriented to person, place, and time and well-developed, well-nourished, and in no distress.  HENT:  Head: Normocephalic and atraumatic.  Cardiovascular:  Normal rate, regular rhythm, normal heart sounds and intact distal pulses.  Pulmonary/Chest: Effort normal.  Neurological: She is alert and oriented to person, place, and time.  Skin: Skin is warm and dry.       Recent Results (from the past 2160 hour(s))  Hepatic function panel     Status: Abnormal   Collection Time: 07/02/17 11:01 AM  Result Value Ref Range   Total Protein 6.4 (L) 6.5 - 8.1 g/dL   Albumin 3.7 3.5 - 5.0 g/dL   AST 23 15 - 41 U/L   ALT 20 14 - 54 U/L   Alkaline Phosphatase 88 38 - 126 U/L   Total Bilirubin 0.8 0.3 - 1.2 mg/dL   Bilirubin, Direct 0.1 0.1 - 0.5 mg/dL   Indirect Bilirubin 0.7 0.3 - 0.9 mg/dL  Lipid Profile     Status: Abnormal   Collection Time: 07/02/17 11:01 AM  Result Value Ref Range   Cholesterol 184 0 - 200 mg/dL   Triglycerides 166 (H) <150 mg/dL   HDL 50 >40 mg/dL   Total CHOL/HDL Ratio 3.7 RATIO   VLDL 33 0 - 40 mg/dL   LDL Cholesterol 101 (H) 0 - 99 mg/dL    Comment:        Total Cholesterol/HDL:CHD Risk Coronary Heart Disease Risk Table                     Men   Women  1/2 Average Risk   3.4   3.3  Average Risk       5.0   4.4  2 X Average Risk   9.6   7.1  3 X Average Risk  23.4   11.0        Use the calculated Patient Ratio above and the CHD Risk Table to determine the patient's CHD Risk.        ATP III CLASSIFICATION (LDL):  <100     mg/dL   Optimal  100-129  mg/dL   Near or Above                    Optimal  130-159  mg/dL   Borderline  160-189  mg/dL   High  >190     mg/dL   Very High   POCT Urinalysis Dipstick     Status: Abnormal   Collection Time: 08/30/17 10:36 AM  Result Value Ref Range   Color, UA yellow    Clarity, UA clear    Glucose, UA negative    Bilirubin, UA negative    Ketones, UA negative    Spec Grav, UA >=1.030 (A) 1.010 - 1.025   Blood, UA negative    pH, UA 5.0 5.0 - 8.0   Protein, UA trace    Urobilinogen, UA 0.2 0.2 or 1.0 E.U./dL   Nitrite, UA negative    Leukocytes, UA Negative Negative    Appearance     Odor      Assessment/Plan: 1. Herpes zoster without complication Resolving. Discussed pain can last past healing of rash. Thankfully pain is intermittent and much milder. Reviewed OTC medication regimen. Continue topical lidocaine as needed.    Leeanne Rio, PA-C

## 2017-09-19 NOTE — Patient Instructions (Signed)
Please keep well-hydrated and get plenty of rest.  Alternate the Tylenol and Motrin as needed for pain. Only take when needed and do not take more than as directed. Continue the Lidocaine cream to the area.  The rash is all scabbed over so you should not be contagious at this point.  I would still limit very close contact with infants or pregnant women until rash is completely gone.   Call me if you have any further questions. Have a safe trip!

## 2017-10-09 ENCOUNTER — Telehealth (HOSPITAL_COMMUNITY): Payer: Self-pay

## 2017-10-09 NOTE — Telephone Encounter (Signed)
Pt left triage a VM that she had shingles and was not able to start statin. She had some reactions to other medications she was started on for Tx. She is okay to reschedule lab appt. Left her a VM that she can call back an reschedule.

## 2017-10-11 ENCOUNTER — Other Ambulatory Visit (HOSPITAL_COMMUNITY): Payer: Self-pay

## 2017-10-12 ENCOUNTER — Other Ambulatory Visit (HOSPITAL_COMMUNITY): Payer: Medicare Other

## 2017-10-13 ENCOUNTER — Other Ambulatory Visit: Payer: Self-pay | Admitting: Gastroenterology

## 2017-10-15 DIAGNOSIS — R300 Dysuria: Secondary | ICD-10-CM | POA: Diagnosis not present

## 2017-10-15 DIAGNOSIS — R350 Frequency of micturition: Secondary | ICD-10-CM | POA: Diagnosis not present

## 2017-10-15 DIAGNOSIS — R81 Glycosuria: Secondary | ICD-10-CM | POA: Diagnosis not present

## 2017-10-16 ENCOUNTER — Telehealth: Payer: Self-pay | Admitting: Gastroenterology

## 2017-10-16 NOTE — Telephone Encounter (Signed)
Patient requesting medication refill for [facility]ac [facility]. Pt scheduled for follow up on 7.5.19.

## 2017-10-16 NOTE — Telephone Encounter (Signed)
Called and spoke to pt.  We sent a 3 month supply top [facility] last month.  She will call them to inquire.  Confirmed her appt in July.

## 2017-11-14 ENCOUNTER — Telehealth (HOSPITAL_COMMUNITY): Payer: Self-pay | Admitting: *Deleted

## 2017-11-14 NOTE — Telephone Encounter (Signed)
Advanced Heart Failure Triage Encounter  Patient Name: Emily [name]  Date of Call: 11/14/17  Problem:  Patient called stating she is having joint pains since starting Crestor.  She never experienced these with Lipitor and is requesting to switch back.   Plan:  Will forward to Dr. Aundra Dubin to review and will call patient back with his response.   Darron Doom, RN

## 2017-11-14 NOTE — Telephone Encounter (Signed)
Go back to atorvastatin 80 mg daily and stop Crestor.  Since atorvastatin was not adequate to get LDL < 70, would like her to take Zetia 10 mg daily as well.

## 2017-11-15 MED ORDER — EZETIMIBE 10 MG PO TABS: 10.0000 mg | ORAL_TABLET | Freq: Every day | ORAL | 0 refills | Status: DC

## 2017-11-15 MED ORDER — ATORVASTATIN CALCIUM 80 MG PO TABS: 80.0000 mg | ORAL_TABLET | Freq: Every day | ORAL | 3 refills | Status: DC

## 2017-11-15 NOTE — Telephone Encounter (Signed)
Called and spoke with patient.  She was not very happy about starting Zetia but said she would try a 10 day supply for now.  If she does ok she said she will call back for refills.  She was very hesitant to start it.  Atorvastatin and Zetia sent to preferred pharmacy.

## 2017-11-20 ENCOUNTER — Other Ambulatory Visit (HOSPITAL_COMMUNITY): Payer: Medicare Other

## 2017-12-12 DIAGNOSIS — R350 Frequency of micturition: Secondary | ICD-10-CM | POA: Diagnosis not present

## 2017-12-12 DIAGNOSIS — Z124 Encounter for screening for malignant neoplasm of cervix: Secondary | ICD-10-CM | POA: Diagnosis not present

## 2017-12-12 DIAGNOSIS — Z01419 Encounter for gynecological examination (general) (routine) without abnormal findings: Secondary | ICD-10-CM | POA: Diagnosis not present

## 2017-12-12 DIAGNOSIS — Z1231 Encounter for screening mammogram for malignant neoplasm of breast: Secondary | ICD-10-CM | POA: Diagnosis not present

## 2017-12-12 DIAGNOSIS — Z1211 Encounter for screening for malignant neoplasm of colon: Secondary | ICD-10-CM | POA: Diagnosis not present

## 2017-12-12 DIAGNOSIS — Z6828 Body mass index (BMI) 28.0-28.9, adult: Secondary | ICD-10-CM | POA: Diagnosis not present

## 2017-12-12 DIAGNOSIS — N904 Leukoplakia of vulva: Secondary | ICD-10-CM | POA: Diagnosis not present

## 2017-12-21 ENCOUNTER — Encounter: Payer: Self-pay | Admitting: Gastroenterology

## 2017-12-21 ENCOUNTER — Ambulatory Visit (INDEPENDENT_AMBULATORY_CARE_PROVIDER_SITE_OTHER): Payer: Medicare Other | Admitting: Gastroenterology

## 2017-12-21 VITALS — BP 126/64 | HR 72 | Ht 61.75 in | Wt 152.4 lb

## 2017-12-21 DIAGNOSIS — Z86010 Personal history of colonic polyps: Secondary | ICD-10-CM | POA: Diagnosis not present

## 2017-12-21 DIAGNOSIS — I2510 Atherosclerotic heart disease of native coronary artery without angina pectoris: Secondary | ICD-10-CM

## 2017-12-21 DIAGNOSIS — K2100 Gastro-esophageal reflux disease with esophagitis, without bleeding: Secondary | ICD-10-CM

## 2017-12-21 NOTE — Patient Instructions (Addendum)
If you are age 67 or older, your body mass index should be between 23-30. Your Body mass index is 28.1 kg/m. If this is out of the aforementioned range listed, please consider follow up with your Primary Care Provider.  If you are age 70 or younger, your body mass index should be between 19-25. Your Body mass index is 28.1 kg/m. If this is out of the aformentioned range listed, please consider follow up with your Primary Care Provider.   You have been scheduled for an endoscopy. Please follow written instructions given to you at your visit today. If you use inhalers (even only as needed), please bring them with you on the day of your procedure. Your physician has requested that you go to www.[email or web address].[email or web address] and enter the access code given to you at your visit today. This web site gives a general overview about your procedure. However, you should still follow specific instructions given to you by our office regarding your preparation for the procedure.  Please continue taking Zantac twice a day.   Thank you for entrusting me with your care and for choosing [facility], Dr. Avon Cellar

## 2017-12-21 NOTE — Progress Notes (Signed)
HPI :  67 year old female with a history of reflux, here for reassessment.  She had an upper endoscopy in 2017 showing grade B esophagitis, biopsies at the time showed no evidence of Barrett's. She was not taking regular antacids at that time. Initially was placed on omeprazole, and then transition to Zantac. She is currently taking Zantac twice a day and this worked very well to control her reflux symptoms. She did have one episode of severe pyrosis last week, this is the first time this is happening a long time, and much worse than usual. Her symptoms resolved over time.She denies any dysphagiaor difficulty eating otherwise. She usually has fair control of her symptoms on Zantac. If she has any breakthrough she'll use Gaviscon when necessary.  She otherwise had a colonoscopy in February 2017, 6 adenoma/serrated polyps removed. She is due for surveillance colonoscopy in February 2020. No problems with the bowels at this time.  She continues to take Plavix for history of coronary artery disease.  EGD 07/30/2015 - grade B esophagitis, squamous papillomas, normal stomach / duodenum. Biopsies showed no evidence of Barrett's.  Colonoscopy 07/30/2015 - showed 6 small polyps removed, adneomas / serrated polyps, due for recall in 3 years.      Past Medical History:  Diagnosis Date  . Anxiety   . CAD (coronary artery disease)   . Chronic insomnia   . Colon polyp   . Depression   . GERD with esophagitis   . Heart attack ([place]) 1-09  . Hyperlipemia   . Hypertension   . Perirectal abscess   . Shingles 08/31/2017   2nd dx with shingles, per pt     Past Surgical History:  Procedure Laterality Date  . CESAREAN SECTION     x 2  . COLONOSCOPY W/ POLYPECTOMY  2007  . INCISION AND DRAINAGE ABSCESS ANAL    . PTCA with stent to third obtuse marginal     Family History  Problem Relation Age of Onset  . Skin cancer Mother   . Allergy (severe) Father   . Heart disease Father   . Leukemia  Father        CML  . Renal Disease Paternal Grandfather        one removed   . Colon cancer Neg Hx    Social History   Tobacco Use  . Smoking status: Former Smoker    Packs/day: 2.00    Years: 20.00    Pack years: 40.00    Types: Cigarettes    Last attempt to quit: 06/19/2006    Years since quitting: 11.5  . Smokeless tobacco: Never Used  Substance Use Topics  . Alcohol use: Yes    Alcohol/week: 0.0 oz    Comment: <2 monthly; rare  . Drug use: No   Current Outpatient Medications  Medication Sig Dispense Refill  . acetaminophen (TYLENOL) 650 MG CR tablet Take 650 mg by mouth every 8 (eight) hours as needed for pain.    . Acetylcarnitine HCl (ACETYL L-CARNITINE) 250 MG CAPS Take by mouth. 200 mg every other day    . albuterol (PROVENTIL HFA;VENTOLIN HFA) 108 (90 Base) MCG/ACT inhaler Inhale 2 puffs into the lungs every 6 (six) hours as needed for wheezing or shortness of breath. 1 Inhaler 0  . ALPRAZolam (XANAX) 0.25 MG tablet Take 1-1.5 tablets by mouth as needed at bedtime 15 tablet 1  . atorvastatin (LIPITOR) 80 MG tablet Take 1 tablet (80 mg total) by mouth daily. 90 tablet 3  .  b complex vitamins tablet Take 1 tablet by mouth daily.      . Biotin 300 MCG TABS Take 600 mcg by mouth every other day.      . Calcium-Magnesium-Zinc 500-250-12.5 MG TABS Take 1 tablet by mouth every other day.      . Chromium Picolinate 200 MCG TABS Take 1 tablet by mouth every other day.      . clobetasol cream (TEMOVATE) 0.05 % APPLY A THIN LAYER TOPICALLY TO AFFECTED AREA(S) DAILY  0  . clopidogrel (PLAVIX) 75 MG tablet Take 1 tablet (75 mg total) by mouth daily. Needs office visit 90 tablet 3  . Coenzyme Q10 (COQ10) 200 MG CAPS Take 1 capsule by mouth daily.      Marland Kitchen CRANBERRY JUICE EXTRACT PO Take 500 mg by mouth daily.      Marland Kitchen EPINEPHrine 0.3 mg/0.3 mL IJ SOAJ injection INJECT 0.3 MLS INTO THE MUSCLE ONCE FOR 1 DOSE  0  . Flaxseed, Linseed, (FLAX SEED OIL) 1000 MG CAPS Take 1,200 mg by mouth  daily.      . Garlic Oil 277 MG TABS Take 1 tablet by mouth daily.      . metoprolol tartrate (LOPRESSOR) 25 MG tablet Take 0.5 tablets (12.5 mg total) by mouth 2 (two) times daily. 90 tablet 3  . nitroGLYCERIN (NITROSTAT) 0.4 MG SL tablet Place 1 tablet (0.4 mg total) under the tongue every 5 (five) minutes x 3 doses as needed for chest pain. 25 tablet 3  . omega-3 acid ethyl esters (LOVAZA) 1 g capsule Take 1 capsule (1 g total) by mouth daily. 90 capsule 3  . Probiotic Product (ALIGN PO) Take 1 capsule by mouth daily.    . ranitidine (ZANTAC) 150 MG tablet Take 1 tablet (150 mg total) by mouth 2 (two) times daily. PATIENT MUST HAVE OFFICE VISIT FOR ANY FURTHER REFILLS!!! (Patient taking differently: Take 150 mg by mouth 2 (two) times daily. ) 180 tablet 0  . Rhodiola 300 MG CAPS Take 1 capsule by mouth daily.       No current facility-administered medications for this visit.    Allergies  Allergen Reactions  . Benzonatate     Unknown, patient states "it made me sick"  . Chlorpromazine Hcl     unknown  . Codeine     REACTION: causes vomiting  . Doxycycline     "explosive diarrhea" -does not wish to take this anymore  . Hydrocodone     REACTION: causes vomiing and nausea  . Hydrocodone-Acetaminophen Nausea And Vomiting  . Lovaza [Omega-3-Acid Ethyl Esters] Other (See Comments)    Loose stools with two capsules  . Penicillins     REACTION: causes rash  . Prochlorperazine Edisylate     unknown   . Promethazine Hcl     unknown  . Sulfonamide Derivatives     unknown      Review of Systems: All systems reviewed and negative except where noted in HPI.   Lab Results  Component Value Date   WBC 6.6 03/09/2017   HGB 14.3 03/09/2017   HCT 43.5 03/09/2017   MCV 88.1 03/09/2017   PLT 254 03/09/2017    Lab Results  Component Value Date   CREATININE 0.66 03/09/2017   BUN 10 03/09/2017   NA 140 03/09/2017   K 3.4 (L) 03/09/2017   CL 104 03/09/2017   CO2 29 03/09/2017    Lab Results  Component Value Date   ALT 20 07/02/2017   AST 23  07/02/2017   ALKPHOS 88 07/02/2017   BILITOT 0.8 07/02/2017     Physical Exam: BP 126/64   Pulse 72   Ht 5' 1.75" (1.568 m)   Wt 152 lb 6.4 oz (69.1 kg)   BMI 28.10 kg/m  Constitutional: Pleasant,well-developed, female in no acute distress. HEENT: Normocephalic and atraumatic. Conjunctivae are normal. No scleral icterus. Neck supple.  Cardiovascular: Normal rate, regular rhythm.  Pulmonary/chest: Effort normal and breath sounds normal. No wheezing, rales or rhonchi. Abdominal: Soft, nondistended, nontender.  There are no masses palpable. No hepatomegaly. Extremities: no edema Lymphadenopathy: No cervical adenopathy noted. Neurological: Alert and oriented to person place and time. Skin: Skin is warm and dry. No rashes noted. Psychiatric: Normal mood and affect. Behavior is normal.   ASSESSMENT AND PLAN: 67 year old female here for reassessment following issues:  GERD with esophagitis - generally well controlled with Zantac with minimal breakthrough, although with 1 isolated episode of fairly severe breakthrough in recent weeks. We discussed management options moving forward. She would prefer to avoid PPIs possible given potential risks, and that Zantac has been doing well for her general. She did have moderate esophagitis on her last EGD, and would recommend repeating an endoscopy while on Zantac at this time to ensure no evidence of persistent ulceration and to ensure no evidence of Barrett's esophagus. If she would have either these would make a case to transition to PPI for maintenance. In the absence of these continue with Zantac would be her preference. I discussed risks benefits of upper endoscopy with her as well as that of anesthesia. For her upper endoscopy I think it's okay to continue Plavix during this time, as we will only be taking biopsies at most. She agreed with the plan.  History of colon polyps -  due for surveillance February 2020  [place] Cellar, MD [facility]

## 2017-12-25 ENCOUNTER — Other Ambulatory Visit: Payer: Self-pay

## 2017-12-25 ENCOUNTER — Encounter: Payer: Self-pay | Admitting: Physician Assistant

## 2017-12-25 ENCOUNTER — Ambulatory Visit (INDEPENDENT_AMBULATORY_CARE_PROVIDER_SITE_OTHER): Payer: Medicare Other | Admitting: Physician Assistant

## 2017-12-25 VITALS — BP 126/78 | HR 51 | Temp 97.9°F | Resp 16 | Ht 61.75 in | Wt 152.2 lb

## 2017-12-25 DIAGNOSIS — M25512 Pain in left shoulder: Secondary | ICD-10-CM

## 2017-12-25 DIAGNOSIS — I2510 Atherosclerotic heart disease of native coronary artery without angina pectoris: Secondary | ICD-10-CM

## 2017-12-25 DIAGNOSIS — E785 Hyperlipidemia, unspecified: Secondary | ICD-10-CM

## 2017-12-25 MED ORDER — MELOXICAM 15 MG PO TABS: 15.0000 mg | ORAL_TABLET | Freq: Every day | ORAL | 0 refills | Status: DC

## 2017-12-25 NOTE — Progress Notes (Signed)
Patient presents to clinic today c/o L shoulder pain starting in the fall after her Cardiologist switched her from her Atorvastatin to Crestor in an attempt to improve cholesterol further. Patient states she noted the pain on day 3 or 4 of the Crestor. Continued the Crestor for a few weeks with noted worsening of pain. Stopped the Crestor due to reading list of potential side effects.Restarted her Atorvastatin. Notes symptoms improved markedly after switching back. Still having residual pain only present with certain movements of shoulder, typically rotation. Denies pain other times. Denies weakness, numbness or tingling of extremity.   Past Medical History:  Diagnosis Date  . Anxiety   . CAD (coronary artery disease)   . Chronic insomnia   . Colon polyp   . Depression   . GERD with esophagitis   . Heart attack ([place]) 1-09  . Hyperlipemia   . Hypertension   . Perirectal abscess   . Shingles 08/31/2017   2nd dx with shingles, per pt    Current Outpatient Medications on File Prior to Visit  Medication Sig Dispense Refill  . acetaminophen (TYLENOL) 650 MG CR tablet Take 650 mg by mouth every 8 (eight) hours as needed for pain (Rarely uses this medication).     . Acetylcarnitine HCl (ACETYL L-CARNITINE) 250 MG CAPS Take by mouth. 200 mg every other day    . ALPRAZolam (XANAX) 0.25 MG tablet Take 1-1.5 tablets by mouth as needed at bedtime 15 tablet 1  . atorvastatin (LIPITOR) 80 MG tablet Take 1 tablet (80 mg total) by mouth daily. 90 tablet 3  . b complex vitamins tablet Take 1 tablet by mouth daily.      . Biotin 300 MCG TABS Take 600 mcg by mouth every other day.      . Calcium-Magnesium-Zinc 500-250-12.5 MG TABS Take 1 tablet by mouth every other day.      . Chromium Picolinate 200 MCG TABS Take 1 tablet by mouth every other day.      . clobetasol cream (TEMOVATE) 0.05 % APPLY A THIN LAYER TOPICALLY TO AFFECTED AREA(S) DAILY  0  . clopidogrel (PLAVIX) 75 MG tablet Take 1 tablet (75 mg  total) by mouth daily. Needs office visit 90 tablet 3  . Coenzyme Q10 (COQ10) 200 MG CAPS Take 1 capsule by mouth daily.      Marland Kitchen CRANBERRY JUICE EXTRACT PO Take 500 mg by mouth daily.      Marland Kitchen EPINEPHrine 0.3 mg/0.3 mL IJ SOAJ injection INJECT 0.3 MLS INTO THE MUSCLE ONCE FOR 1 DOSE  0  . Flaxseed, Linseed, (FLAX SEED OIL) 1000 MG CAPS Take 1,200 mg by mouth daily.      . Garlic Oil 176 MG TABS Take 1 tablet by mouth daily.      . metoprolol tartrate (LOPRESSOR) 25 MG tablet Take 0.5 tablets (12.5 mg total) by mouth 2 (two) times daily. 90 tablet 3  . nitroGLYCERIN (NITROSTAT) 0.4 MG SL tablet Place 1 tablet (0.4 mg total) under the tongue every 5 (five) minutes x 3 doses as needed for chest pain. 25 tablet 3  . omega-3 acid ethyl esters (LOVAZA) 1 g capsule Take 1 capsule (1 g total) by mouth daily. 90 capsule 3  . Probiotic Product (ALIGN PO) Take 1 capsule by mouth daily.    . ranitidine (ZANTAC) 150 MG tablet Take 1 tablet (150 mg total) by mouth 2 (two) times daily. PATIENT MUST HAVE OFFICE VISIT FOR ANY FURTHER REFILLS!!! (Patient taking differently: Take 150  mg by mouth 2 (two) times daily. ) 180 tablet 0  . Rhodiola 300 MG CAPS Take 1 capsule by mouth daily.      Marland Kitchen triamcinolone (KENALOG) 0.025 % ointment After completion of 2 weeks of clobetasol. APPLY A THIN LAYER topically TO THE AFFECTED AREA(S) 2 TIMES PER DAY IF NEEDED  3  . albuterol (PROVENTIL HFA;VENTOLIN HFA) 108 (90 Base) MCG/ACT inhaler Inhale 2 puffs into the lungs every 6 (six) hours as needed for wheezing or shortness of breath. (Patient not taking: Reported on 12/25/2017) 1 Inhaler 0   No current facility-administered medications on file prior to visit.     Allergies  Allergen Reactions  . Benzonatate     Unknown, patient states "it made me sick"  . Chlorpromazine Hcl     unknown  . Codeine     REACTION: causes vomiting  . Doxycycline     "explosive diarrhea" -does not wish to take this anymore  . Hydrocodone      REACTION: causes vomiing and nausea  . Hydrocodone-Acetaminophen Nausea And Vomiting  . Lovaza [Omega-3-Acid Ethyl Esters] Other (See Comments)    Loose stools with two capsules  . Penicillins     REACTION: causes rash  . Prochlorperazine Edisylate     unknown   . Promethazine Hcl     unknown  . Sulfonamide Derivatives     unknown     Family History  Problem Relation Age of Onset  . Skin cancer Mother   . Allergy (severe) Father   . Heart disease Father   . Leukemia Father        CML  . Renal Disease Paternal Grandfather        one removed   . Colon cancer Neg Hx     Social History   Socioeconomic History  . Marital status: Married    Spouse name: Not on file  . Number of children: 2  . Years of education: Not on file  . Highest education level: Not on file  Occupational History  . Occupation: Risk manager  Social Needs  . Financial resource strain: Not on file  . Food insecurity:    Worry: Not on file    Inability: Not on file  . Transportation needs:    Medical: Not on file    Non-medical: Not on file  Tobacco Use  . Smoking status: Former Smoker    Packs/day: 2.00    Years: 20.00    Pack years: 40.00    Types: Cigarettes    Last attempt to quit: 06/19/2006    Years since quitting: 11.5  . Smokeless tobacco: Never Used  Substance and Sexual Activity  . Alcohol use: Yes    Alcohol/week: 0.0 oz    Comment: <2 monthly; rare  . Drug use: No  . Sexual activity: Not on file  Lifestyle  . Physical activity:    Days per week: Not on file    Minutes per session: Not on file  . Stress: Not on file  Relationships  . Social connections:    Talks on phone: Not on file    Gets together: Not on file    Attends religious service: Not on file    Active member of club or organization: Not on file    Attends meetings of clubs or organizations: Not on file    Relationship status: Not on file  Other Topics Concern  . Not on file  Social History Narrative     Resides  in [place] with her husband.    Husband works in [place] and comes home two to three times a year   2 children who go to [place]   Just got accepted to [employer]   Exercise- NO   Review of Systems - See HPI.  All other ROS are negative.  BP 126/78   Pulse (!) 51   Temp 97.9 F (36.6 C) (Oral)   Resp 16   Ht 5' 1.75" (1.568 m)   Wt 152 lb 3.2 oz (69 kg)   SpO2 98%   BMI 28.06 kg/m   Physical Exam  Constitutional: She is oriented to person, place, and time. She appears well-developed and well-nourished.  HENT:  Head: Normocephalic and atraumatic.  Eyes: EOM are normal.  Cardiovascular: Normal rate and regular rhythm.  Pulmonary/Chest: Effort normal.  Musculoskeletal:       Left shoulder: She exhibits tenderness. She exhibits normal range of motion, no bony tenderness, no swelling, no spasm and normal strength.  Neurological: She is alert and oriented to person, place, and time.  Psychiatric: She has a normal mood and affect.  Vitals reviewed.  Assessment/Plan: 1. Acute pain of left shoulder Improved after stopping Crestor. Still with residual symptoms. Examination most consistent with mild rotator cuff tendinitis. Start Mobic. Supportive measures and OTC medications reviewed. If not continuing to resolve, will need assessment with Ortho or Sports Med. - meloxicam (MOBIC) 15 MG tablet; Take 1 tablet (15 mg total) by mouth daily.  Dispense: 10 tablet; Refill: 0  2. Hyperlipidemia, unspecified hyperlipidemia type Restarted Atorvastatin per Cardiology. Repeat fasting lipids and LFTs. Will forward to Cardiology.    Leeanne Rio, PA-C

## 2017-12-25 NOTE — Patient Instructions (Signed)
Please continue the Atorvastatin as directed. Schedule an appointment for fasting labs so we can recheck your cholesterol levels. I will send a copy of results to your Cardiologist.   For the shoulder, please continue the CoQ 10 supplement. Take the Mobic once daily for 4-5 days to help with inflammation. Limit heavy lifting overhead.  Symptoms should continue to improve/resolve. If not, we will get you set up for physical therapy.

## 2018-01-08 DIAGNOSIS — H6123 Impacted cerumen, bilateral: Secondary | ICD-10-CM | POA: Diagnosis not present

## 2018-01-08 DIAGNOSIS — H903 Sensorineural hearing loss, bilateral: Secondary | ICD-10-CM | POA: Diagnosis not present

## 2018-01-09 ENCOUNTER — Telehealth: Payer: Self-pay | Admitting: Emergency Medicine

## 2018-01-09 DIAGNOSIS — L659 Nonscarring hair loss, unspecified: Secondary | ICD-10-CM

## 2018-01-09 NOTE — Telephone Encounter (Signed)
To [facility] -- I did not ok an order. This is the first notation regarding this so please be careful with choice of words.  I have reviewed the note and am fine with her having a TSH level drawn here at the office so we can assess further.

## 2018-01-09 NOTE — Telephone Encounter (Signed)
Copied from [place] [id number]. Topic: General - Other >> Jan 09, 2018  2:26 PM Lennox Solders wrote: Reason for CRM: pt is calling and she send ENT dr Elwyn Reach yesterday and she is losing her eyebrow and dr Elwyn Reach recommended her to have TSH check. Please put order in it ok per cody

## 2018-01-10 ENCOUNTER — Telehealth: Payer: Self-pay | Admitting: Physician Assistant

## 2018-01-10 DIAGNOSIS — M25512 Pain in left shoulder: Secondary | ICD-10-CM

## 2018-01-10 NOTE — Telephone Encounter (Signed)
Ok to place referral for physical therapy for her shoulder. No repeat appointment necessary.

## 2018-01-10 NOTE — Telephone Encounter (Signed)
Copied from [place] [id number]. Topic: Quick Communication - See Telephone Encounter >> Jan 10, 2018  1:54 PM Burchel, Abbi R wrote: CRM for notification. See Telephone encounter for: 01/10/18.  Pt states she is still having shoulder pain, and wants to see if Einar Pheasant would like to refer her to PT or is he needs to see her again.  Please advise.   Pt: [phone], ok to leave msg on vmail.

## 2018-01-10 NOTE — Telephone Encounter (Signed)
Please advise 

## 2018-01-10 NOTE — Telephone Encounter (Signed)
Left detailed message on patient VM that we can schedule a lab visit to draw the tsh level for hair thinning If she has further questions can schedule an appointment with Raiford Noble, PA-C to discuss.  TSH order placed in chart.

## 2018-01-10 NOTE — Telephone Encounter (Signed)
CRM created for patient when she calls back. [facility] triage nurse can schedule lab visit.

## 2018-01-11 NOTE — Telephone Encounter (Signed)
Lab visit scheduled.

## 2018-01-11 NOTE — Telephone Encounter (Signed)
Called pt and left a detailed message to advise that referral was placed as requested.

## 2018-01-12 ENCOUNTER — Other Ambulatory Visit: Payer: Self-pay | Admitting: Gastroenterology

## 2018-01-16 ENCOUNTER — Telehealth: Payer: Self-pay | Admitting: Physician Assistant

## 2018-01-16 DIAGNOSIS — M25512 Pain in left shoulder: Secondary | ICD-10-CM

## 2018-01-16 NOTE — Telephone Encounter (Signed)
Per last visit If not continuing to resolve, will need assessment with Ortho or Sports Med. Please advise

## 2018-01-16 NOTE — Telephone Encounter (Signed)
Ok to place referral for patient ?

## 2018-01-16 NOTE — Telephone Encounter (Signed)
Copied from [place] [id number]. Topic: Quick Communication - See Telephone Encounter >> Jan 16, 2018  2:45 PM Neva Seat wrote: Pt requesting MRI or an Orthopedic Dr. Marland Kitchen Dr. Marchia Bond before the physical therapy to rule out if there are any other issues that may be missed.

## 2018-01-16 NOTE — Telephone Encounter (Signed)
Advised patient per PCP that we will place referral for Ortho for further evaluation. Referral placed.

## 2018-01-17 ENCOUNTER — Other Ambulatory Visit (INDEPENDENT_AMBULATORY_CARE_PROVIDER_SITE_OTHER): Payer: Medicare Other

## 2018-01-17 DIAGNOSIS — L659 Nonscarring hair loss, unspecified: Secondary | ICD-10-CM

## 2018-01-17 DIAGNOSIS — E785 Hyperlipidemia, unspecified: Secondary | ICD-10-CM

## 2018-01-17 LAB — LDL CHOLESTEROL, DIRECT: Direct LDL: 104 mg/dL

## 2018-01-17 LAB — LIPID PANEL
Cholesterol: 178 mg/dL (ref 0–200)
HDL: 47.9 mg/dL (ref >39.00)
NONHDL: 130.26
TRIGLYCERIDES: 209 mg/dL — AB (ref 0.0–149.0)
Total CHOL/HDL Ratio: 4
VLDL: 41.8 mg/dL — ABNORMAL HIGH (ref 0.0–40.0)

## 2018-01-17 LAB — HEPATIC FUNCTION PANEL
ALT: 19 U/L (ref 0–35)
AST: 20 U/L (ref 0–37)
Albumin: 4.4 g/dL (ref 3.5–5.2)
Alkaline Phosphatase: 79 U/L (ref 39–117)
Bilirubin, Direct: 0.1 mg/dL (ref 0.0–0.3)
TOTAL PROTEIN: 6.4 g/dL (ref 6.0–8.3)
Total Bilirubin: 0.8 mg/dL (ref 0.2–1.2)

## 2018-01-17 LAB — TSH: TSH: 2.29 u[IU]/mL (ref 0.35–4.50)

## 2018-01-24 ENCOUNTER — Ambulatory Visit: Payer: Medicare Other | Admitting: Physical Therapy

## 2018-01-28 DIAGNOSIS — M542 Cervicalgia: Secondary | ICD-10-CM | POA: Diagnosis not present

## 2018-01-28 DIAGNOSIS — M25512 Pain in left shoulder: Secondary | ICD-10-CM | POA: Diagnosis not present

## 2018-01-29 ENCOUNTER — Ambulatory Visit: Payer: Medicare Other | Admitting: Physical Therapy

## 2018-02-05 ENCOUNTER — Telehealth: Payer: Self-pay | Admitting: Gastroenterology

## 2018-02-05 NOTE — Telephone Encounter (Signed)
Patient rescheduled her EGD to beginning of October due to left shoulder pain. She is on Plavix, looking at office note from 7/5 you approved her to continue taking Plavix prior to procedure. Patient also scheduled for a pre-visit appointment.

## 2018-02-06 NOTE — Telephone Encounter (Signed)
Okay that's fine. Will continue Plavix for her EGD, just looking for active esophagitis / Barrett's. thanks

## 2018-02-08 ENCOUNTER — Encounter: Payer: Medicare Other | Admitting: Gastroenterology

## 2018-03-04 ENCOUNTER — Ambulatory Visit (AMBULATORY_SURGERY_CENTER): Payer: Self-pay

## 2018-03-04 VITALS — Ht 61.75 in | Wt 152.6 lb

## 2018-03-04 DIAGNOSIS — K219 Gastro-esophageal reflux disease without esophagitis: Secondary | ICD-10-CM

## 2018-03-04 NOTE — Progress Notes (Signed)
No egg or soy allergy known to patient  No issues with past sedation with any surgeries  or procedures, no intubation problems  No diet pills per patient No home 02 use per patient  On Plavix  per patient  Pt denies issues with constipation  No A fib or A flutter  EMMI video sent to pt's e mail  Decline

## 2018-03-06 ENCOUNTER — Telehealth: Payer: Self-pay | Admitting: Physician Assistant

## 2018-03-06 DIAGNOSIS — Z8262 Family history of osteoporosis: Secondary | ICD-10-CM | POA: Diagnosis not present

## 2018-03-06 DIAGNOSIS — Z780 Asymptomatic menopausal state: Secondary | ICD-10-CM

## 2018-03-06 DIAGNOSIS — Z1231 Encounter for screening mammogram for malignant neoplasm of breast: Secondary | ICD-10-CM

## 2018-03-06 LAB — HM DEXA SCAN: HM Dexa Scan: NORMAL

## 2018-03-06 NOTE — Telephone Encounter (Signed)
Ok to place referral.

## 2018-03-06 NOTE — Telephone Encounter (Signed)
Ok to place order for Dexa scan to [facility] since patient had already? Last Dexa scan was 02/09/2011

## 2018-03-06 NOTE — Telephone Encounter (Signed)
Copied from [place] [id number]. Topic: General - Other >> Mar 06, 2018  1:34 PM Janace Aris A wrote: Reason for CRM: Patient needs a referral sent over to [facility], patient says she has gotten her bone density scan but needs a referral sent so that her insurance will cover the payment. Patient also wants the test results to be sent to PCP for review.   [phone] Phone- [phone]  Please Advise

## 2018-03-11 ENCOUNTER — Encounter: Payer: Self-pay | Admitting: Emergency Medicine

## 2018-03-13 DIAGNOSIS — M25512 Pain in left shoulder: Secondary | ICD-10-CM | POA: Diagnosis not present

## 2018-03-19 ENCOUNTER — Ambulatory Visit (AMBULATORY_SURGERY_CENTER): Payer: Medicare Other | Admitting: Gastroenterology

## 2018-03-19 ENCOUNTER — Encounter: Payer: Self-pay | Admitting: Gastroenterology

## 2018-03-19 VITALS — BP 124/64 | HR 57 | Temp 98.6°F | Resp 12 | Ht 62.0 in | Wt 152.0 lb

## 2018-03-19 DIAGNOSIS — K219 Gastro-esophageal reflux disease without esophagitis: Secondary | ICD-10-CM | POA: Diagnosis not present

## 2018-03-19 DIAGNOSIS — I2510 Atherosclerotic heart disease of native coronary artery without angina pectoris: Secondary | ICD-10-CM | POA: Diagnosis not present

## 2018-03-19 DIAGNOSIS — I10 Essential (primary) hypertension: Secondary | ICD-10-CM | POA: Diagnosis not present

## 2018-03-19 MED ORDER — FAMOTIDINE 10 MG PO TABS: 10.0000 mg | ORAL_TABLET | Freq: Two times a day (BID) | ORAL | 3 refills | Status: AC

## 2018-03-19 MED ORDER — SODIUM CHLORIDE 0.9 % IV SOLN: 500.0000 mL | Freq: Once | INTRAVENOUS | Status: DC

## 2018-03-19 NOTE — Progress Notes (Signed)
Pt's states no medical or surgical changes since previsit or office visit. 

## 2018-03-19 NOTE — Op Note (Signed)
[place] Patient Name: Emily [name] Procedure Date: 03/19/2018 10:26 AM MRN: [id number] Endoscopist: Remo Lipps P. Havery Moros , MD Age: 67 Referring MD:  Date of Birth: 1950-07-14 Gender: Female Account #: [id number] Procedure:                Upper GI endoscopy Indications:              Follow-up of esophageal reflux - history of grade B                            esophagitis, now on Zantac twice daily with pretty                            good control of symptoms Medicines:                Monitored Anesthesia Care Procedure:                Pre-Anesthesia Assessment:                           - Prior to the procedure, a History and Physical                            was performed, and patient medications and                            allergies were reviewed. The patient's tolerance of                            previous anesthesia was also reviewed. The risks                            and benefits of the procedure and the sedation                            options and risks were discussed with the patient.                            All questions were answered, and informed consent                            was obtained. Prior Anticoagulants: The patient has                            taken Plavix (clopidogrel), last dose was 1 day                            prior to procedure. ASA Grade Assessment: II - A                            patient with mild systemic disease. After reviewing                            the risks and benefits, the patient was deemed in  satisfactory condition to undergo the procedure.                           After obtaining informed consent, the endoscope was                            passed under direct vision. Throughout the                            procedure, the patient's blood pressure, pulse, and                            oxygen saturations were monitored continuously. The   Endoscope was introduced through the mouth, and                            advanced to the second part of duodenum. The upper                            GI endoscopy was accomplished without difficulty.                            The patient tolerated the procedure well. Scope In: Scope Out: Findings:                 Esophagogastric landmarks were identified: the                            Z-line was found at 34 cm, the gastroesophageal                            junction was found at 34 cm and the upper extent of                            the gastric folds was found at 36 cm from the                            incisors.                           The Z-line was regular. No evidence of Barrett's.                            Prior esophagitis has since healed                           A 2 cm hiatal hernia was present.                           A diverticulum with a small opening was found at                            the gastroesophageal junction.  The exam of the esophagus was otherwise normal.                           The entire examined stomach was normal.                           The duodenal bulb and second portion of the                            duodenum were normal. Complications:            No immediate complications. Estimated blood loss:                            Minimal. Estimated Blood Loss:     Estimated blood loss was minimal. Impression:               - Esophagogastric landmarks identified.                           - Z-line regular, no Barrett's or esophagitis.                           - 2 cm hiatal hernia.                           - Diverticulum at the gastroesophageal junction.                           - Normal stomach.                           - Normal duodenal bulb and second portion of the                            duodenum. Recommendation:           - Patient has a contact number available for                             emergencies. The signs and symptoms of potential                            delayed complications were discussed with the                            patient. Return to normal activities tomorrow.                            Written discharge instructions were provided to the                            patient.                           - Resume previous diet.                           -  Continue present medications including Plavix.                           - Given recent FDA report regarding safety of                            Zantac, would switch from Zantac to Pepsid 10-20mg                             twice daily Shenice Dolder P. Chais Fehringer, MD 03/19/2018 10:43:32 AM This report has been signed electronically.

## 2018-03-19 NOTE — Progress Notes (Signed)
Report to PACU, RN, vss, BBS= Clear.  

## 2018-03-19 NOTE — Patient Instructions (Signed)
*   Take Pepcid 10mg  1-2 tablets twice daily. Resume current medications including plavix.   YOU HAD AN ENDOSCOPIC PROCEDURE TODAY AT [facility]:   Refer to the procedure report that was given to you for any specific questions about what was found during the examination.  If the procedure report does not answer your questions, please call your gastroenterologist to clarify.  If you requested that your care partner not be given the details of your procedure findings, then the procedure report has been included in a sealed envelope for you to review at your convenience later.  YOU SHOULD EXPECT: Some feelings of bloating in the abdomen. Passage of more gas than usual.  Walking can help get rid of the air that was put into your GI tract during the procedure and reduce the bloating. If you had a lower endoscopy (such as a colonoscopy or flexible sigmoidoscopy) you may notice spotting of blood in your stool or on the toilet paper. If you underwent a bowel prep for your procedure, you may not have a normal bowel movement for a few days.  Please Note:  You might notice some irritation and congestion in your nose or some drainage.  This is from the oxygen used during your procedure.  There is no need for concern and it should clear up in a day or so.  SYMPTOMS TO REPORT IMMEDIATELY:   Following upper endoscopy (EGD)  Vomiting of blood or coffee ground material  New chest pain or pain under the shoulder blades  Painful or persistently difficult swallowing  New shortness of breath  Fever of 100F or higher  Black, tarry-looking stools  For urgent or emergent issues, a gastroenterologist can be reached at any hour by calling [phone].   DIET:  We do recommend a small meal at first, but then you may proceed to your regular diet.  Drink plenty of fluids but you should avoid alcoholic beverages for 24 hours.  ACTIVITY:  You should plan to take it easy for the rest of today and you  should NOT DRIVE or use heavy machinery until tomorrow (because of the sedation medicines used during the test).    FOLLOW UP: Our staff will call the number listed on your records the next business day following your procedure to check on you and address any questions or concerns that you may have regarding the information given to you following your procedure. If we do not reach you, we will leave a message.  However, if you are feeling well and you are not experiencing any problems, there is no need to return our call.  We will assume that you have returned to your regular daily activities without incident.  If any biopsies were taken you will be contacted by phone or by letter within the next 1-3 weeks.  Please call us at [phone] if you have not heard about the biopsies in 3 weeks.    SIGNATURES/CONFIDENTIALITY: You and/or your care partner have signed paperwork which will be entered into your electronic medical record.  These signatures attest to the fact that that the information above on your After Visit Summary has been reviewed and is understood.  Full responsibility of the confidentiality of this discharge information lies with you and/or your care-partner.

## 2018-03-20 ENCOUNTER — Telehealth: Payer: Self-pay

## 2018-03-20 NOTE — Telephone Encounter (Signed)
Called [phone] and left a messaged we tried to reach pt for a follow up call. maw

## 2018-03-20 NOTE — Telephone Encounter (Signed)
Called [phone] and left a messaged we tried to reach pt for a follow up call. maw

## 2018-03-26 ENCOUNTER — Telehealth: Payer: Self-pay | Admitting: Physician Assistant

## 2018-03-26 DIAGNOSIS — M7542 Impingement syndrome of left shoulder: Secondary | ICD-10-CM | POA: Diagnosis not present

## 2018-03-26 DIAGNOSIS — M7552 Bursitis of left shoulder: Secondary | ICD-10-CM | POA: Diagnosis not present

## 2018-03-26 DIAGNOSIS — M25612 Stiffness of left shoulder, not elsewhere classified: Secondary | ICD-10-CM | POA: Diagnosis not present

## 2018-03-26 DIAGNOSIS — M25512 Pain in left shoulder: Secondary | ICD-10-CM | POA: Diagnosis not present

## 2018-03-26 NOTE — Telephone Encounter (Signed)
Patient called and discussed her frustrations about going to [facility] and how there was a possibility that she would not be seeing the same PT although she preferred to see the same PT. She wanted to know how [facility] scheduled and I explained that we only have 1 PT unless she is out of the office and we would either reschedule or have someone come in her place for that time.   Patient just wanted Elyn Aquas, PA-C to know her frustrations and I told her that I would be happy to communicate that to the office. She stated that she may want another referral sent to [facility] but would update soon.

## 2018-03-27 ENCOUNTER — Ambulatory Visit (INDEPENDENT_AMBULATORY_CARE_PROVIDER_SITE_OTHER): Payer: Medicare Other | Admitting: Physician Assistant

## 2018-03-27 ENCOUNTER — Encounter: Payer: Self-pay | Admitting: Physician Assistant

## 2018-03-27 ENCOUNTER — Other Ambulatory Visit: Payer: Self-pay

## 2018-03-27 VITALS — BP 118/80 | HR 64 | Temp 98.0°F | Resp 14 | Ht 62.0 in | Wt 153.0 lb

## 2018-03-27 DIAGNOSIS — R399 Unspecified symptoms and signs involving the genitourinary system: Secondary | ICD-10-CM

## 2018-03-27 DIAGNOSIS — N390 Urinary tract infection, site not specified: Secondary | ICD-10-CM

## 2018-03-27 DIAGNOSIS — I2510 Atherosclerotic heart disease of native coronary artery without angina pectoris: Secondary | ICD-10-CM | POA: Diagnosis not present

## 2018-03-27 LAB — POCT URINALYSIS DIPSTICK OB
Bilirubin, UA: NEGATIVE
Blood, UA: NEGATIVE
Glucose, UA: NEGATIVE
Ketones, UA: NEGATIVE
LEUKOCYTES UA: NEGATIVE
Nitrite, UA: POSITIVE
PH UA: 6 (ref 5.0–8.0)
PROTEIN: NEGATIVE
Spec Grav, UA: 1.025 (ref 1.010–1.025)
UROBILINOGEN UA: 0.2 U/dL

## 2018-03-27 MED ORDER — CIPROFLOXACIN HCL 500 MG PO TABS: 500.0000 mg | ORAL_TABLET | Freq: Two times a day (BID) | ORAL | 0 refills | Status: DC

## 2018-03-27 NOTE — Patient Instructions (Signed)
Your symptoms are consistent with a bladder infection, also called acute cystitis. Please take your antibiotic (Cipro) as directed until all pills are gone.  Stay very well hydrated.  Consider a daily probiotic (Align, Culturelle, or Activia) to help prevent stomach upset caused by the antibiotic.  Taking a probiotic daily may also help prevent recurrent UTIs.  Also consider taking AZO (Phenazopyridine) tablets to help decrease pain with urination.  I will call you with your urine testing results.  We will change antibiotics if indicated.  Call or return to clinic if symptoms are not resolved by completion of antibiotic.   Urinary Tract Infection A urinary tract infection (UTI) can occur any place along the urinary tract. The tract includes the kidneys, ureters, bladder, and urethra. A type of germ called bacteria often causes a UTI. UTIs are often helped with antibiotic medicine.  HOME CARE   If given, take antibiotics as told by your doctor. Finish them even if you start to feel better.  Drink enough fluids to keep your pee (urine) clear or pale yellow.  Avoid tea, drinks with caffeine, and bubbly (carbonated) drinks.  Pee often. Avoid holding your pee in for a long time.  Pee before and after having sex (intercourse).  Wipe from front to back after you poop (bowel movement) if you are a woman. Use each tissue only once. GET HELP RIGHT AWAY IF:   You have back pain.  You have lower belly (abdominal) pain.  You have chills.  You feel sick to your stomach (nauseous).  You throw up (vomit).  Your burning or discomfort with peeing does not go away.  You have a fever.  Your symptoms are not better in 3 days. MAKE SURE YOU:   Understand these instructions.  Will watch your condition.  Will get help right away if you are not doing well or get worse. Document Released: 11/22/2007 Document Revised: 02/28/2012 Document Reviewed: 01/04/2012 Exit[employer] Patient Information 2015  [employer]. This information is not intended to replace advice given to you by your health care provider. Make sure you discuss any questions you have with your health care provider.    

## 2018-03-27 NOTE — Progress Notes (Signed)
Subjective:    Emily [name] is a 67 y.o. female who complains of burning with urination, dysuria, frequency and urgency. She has had symptoms for 2 weeks.. Patient denies back pain and fever. Patient does have a history of recurrent UTI. Patient does not have a history of pyelonephritis.   The following portions of the patient's history were reviewed and updated as appropriate: allergies, current medications, past family history, past medical history, past social history, past surgical history and problem list.  Review of Systems Pertinent items are noted in HPI.    Objective:    BP 118/80   Pulse 64   Temp 98 F (36.7 C) (Oral)   Resp 14   Ht 5\' 2"  (1.575 m)   Wt 153 lb (69.4 kg)   SpO2 98%   BMI 27.98 kg/m    General appearance: alert, cooperative, appears stated age and no distress Lungs: clear to auscultation bilaterally Heart: regular rate and rhythm, S1, S2 normal, no murmur, click, rub or gallop Abdomen: soft, non-tender; bowel sounds normal; no masses,  no organomegaly and negative CVA tenderness  Laboratory:  Urine dipstick: 4+ for nitrites.   Micro exam: not done.    Assessment:    Acute cystitis     Plan:    Medications: ciprofloxacin. Maintain adequate hydration. Follow up if symptoms not improving, and as needed.

## 2018-03-29 LAB — URINE CULTURE
MICRO NUMBER:: 91214721
SPECIMEN QUALITY: ADEQUATE

## 2018-03-29 NOTE — Progress Notes (Signed)
+   UTI sensitive to abx used. No further action needed

## 2018-04-08 ENCOUNTER — Other Ambulatory Visit: Payer: Self-pay | Admitting: Gastroenterology

## 2018-04-08 ENCOUNTER — Other Ambulatory Visit: Payer: Self-pay

## 2018-04-09 DIAGNOSIS — M7542 Impingement syndrome of left shoulder: Secondary | ICD-10-CM | POA: Diagnosis not present

## 2018-04-09 DIAGNOSIS — M25512 Pain in left shoulder: Secondary | ICD-10-CM | POA: Diagnosis not present

## 2018-04-09 DIAGNOSIS — M25612 Stiffness of left shoulder, not elsewhere classified: Secondary | ICD-10-CM | POA: Diagnosis not present

## 2018-04-09 DIAGNOSIS — M7552 Bursitis of left shoulder: Secondary | ICD-10-CM | POA: Diagnosis not present

## 2018-04-12 DIAGNOSIS — L658 Other specified nonscarring hair loss: Secondary | ICD-10-CM | POA: Diagnosis not present

## 2018-04-16 DIAGNOSIS — M25512 Pain in left shoulder: Secondary | ICD-10-CM | POA: Diagnosis not present

## 2018-04-16 DIAGNOSIS — M7552 Bursitis of left shoulder: Secondary | ICD-10-CM | POA: Diagnosis not present

## 2018-04-16 DIAGNOSIS — M25612 Stiffness of left shoulder, not elsewhere classified: Secondary | ICD-10-CM | POA: Diagnosis not present

## 2018-04-16 DIAGNOSIS — M7542 Impingement syndrome of left shoulder: Secondary | ICD-10-CM | POA: Diagnosis not present

## 2018-04-22 DIAGNOSIS — M25612 Stiffness of left shoulder, not elsewhere classified: Secondary | ICD-10-CM | POA: Diagnosis not present

## 2018-04-22 DIAGNOSIS — M7542 Impingement syndrome of left shoulder: Secondary | ICD-10-CM | POA: Diagnosis not present

## 2018-04-22 DIAGNOSIS — M7552 Bursitis of left shoulder: Secondary | ICD-10-CM | POA: Diagnosis not present

## 2018-04-22 DIAGNOSIS — M25512 Pain in left shoulder: Secondary | ICD-10-CM | POA: Diagnosis not present

## 2018-04-24 DIAGNOSIS — M7552 Bursitis of left shoulder: Secondary | ICD-10-CM | POA: Diagnosis not present

## 2018-04-29 DIAGNOSIS — M25612 Stiffness of left shoulder, not elsewhere classified: Secondary | ICD-10-CM | POA: Diagnosis not present

## 2018-04-29 DIAGNOSIS — M7542 Impingement syndrome of left shoulder: Secondary | ICD-10-CM | POA: Diagnosis not present

## 2018-04-29 DIAGNOSIS — M25512 Pain in left shoulder: Secondary | ICD-10-CM | POA: Diagnosis not present

## 2018-04-29 DIAGNOSIS — M7552 Bursitis of left shoulder: Secondary | ICD-10-CM | POA: Diagnosis not present

## 2018-05-31 ENCOUNTER — Encounter: Payer: Self-pay | Admitting: Physician Assistant

## 2018-06-03 ENCOUNTER — Other Ambulatory Visit (HOSPITAL_COMMUNITY): Payer: Self-pay | Admitting: Cardiology

## 2018-06-05 DIAGNOSIS — M25512 Pain in left shoulder: Secondary | ICD-10-CM | POA: Diagnosis not present

## 2018-06-14 DIAGNOSIS — M25512 Pain in left shoulder: Secondary | ICD-10-CM | POA: Diagnosis not present

## 2018-06-24 DIAGNOSIS — M25612 Stiffness of left shoulder, not elsewhere classified: Secondary | ICD-10-CM | POA: Diagnosis not present

## 2018-06-26 ENCOUNTER — Emergency Department (HOSPITAL_COMMUNITY)
Admission: EM | Admit: 2018-06-26 | Discharge: 2018-06-26 | Disposition: A | Discharge status: Home / Self Care | Payer: Medicare Other | Attending: Emergency Medicine | Admitting: Emergency Medicine

## 2018-06-26 ENCOUNTER — Telehealth (HOSPITAL_COMMUNITY): Payer: Self-pay

## 2018-06-26 ENCOUNTER — Encounter (HOSPITAL_COMMUNITY): Payer: Self-pay | Admitting: Emergency Medicine

## 2018-06-26 DIAGNOSIS — Z79899 Other long term (current) drug therapy: Secondary | ICD-10-CM | POA: Diagnosis not present

## 2018-06-26 DIAGNOSIS — Z87891 Personal history of nicotine dependence: Secondary | ICD-10-CM | POA: Diagnosis not present

## 2018-06-26 DIAGNOSIS — I10 Essential (primary) hypertension: Secondary | ICD-10-CM | POA: Diagnosis not present

## 2018-06-26 LAB — BASIC METABOLIC PANEL
Anion gap: 10 (ref 5–15)
BUN: 19 mg/dL (ref 8–23)
CHLORIDE: 105 mmol/L (ref 98–111)
CO2: 24 mmol/L (ref 22–32)
Calcium: 9.5 mg/dL (ref 8.9–10.3)
Creatinine, Ser: 0.65 mg/dL (ref 0.44–1.00)
GFR calc Af Amer: >60 mL/min (ref >60)
GFR calc non Af Amer: >60 mL/min (ref >60)
Glucose, Bld: 129 mg/dL — ABNORMAL HIGH (ref 70–99)
Potassium: 3.7 mmol/L (ref 3.5–5.1)
SODIUM: 139 mmol/L (ref 135–145)

## 2018-06-26 LAB — CBC
HEMATOCRIT: 46 % (ref 36.0–46.0)
Hemoglobin: 15.2 g/dL — ABNORMAL HIGH (ref 12.0–15.0)
MCH: 29.7 pg (ref 26.0–34.0)
MCHC: 33 g/dL (ref 30.0–36.0)
MCV: 89.8 fL (ref 80.0–100.0)
Platelets: 356 10*3/uL (ref 150–400)
RBC: 5.12 MIL/uL — ABNORMAL HIGH (ref 3.87–5.11)
RDW: 13.1 % (ref 11.5–15.5)
WBC: 12.5 10*3/uL — AB (ref 4.0–10.5)
nRBC: 0 % (ref 0.0–0.2)

## 2018-06-26 LAB — I-STAT TROPONIN, ED: Troponin i, poc: 0 ng/mL (ref 0.00–0.08)

## 2018-06-26 NOTE — ED Triage Notes (Addendum)
Pt was checking her blood pressure often and found it was higher.  Pt states she is in pain due to her shoulder for which she is receiving treatment and not sure if it is the pain that is increasing her blood pressure. Has been med compliant.   Pt states she is concerned she is having a heart attack b/c she did not have pain w/ her last heart attack.

## 2018-06-26 NOTE — Telephone Encounter (Signed)
Pt called reporting that her bp has been elevated. Pt went to ED at 3am on 01/08 and her bp is   160/76  174/82  161/84     Please advise.

## 2018-06-26 NOTE — Telephone Encounter (Signed)
I reviewed ED notes. It looks like she has been having a lot of pain, which can cause elevated BP. Her troponin was negative, so very unlikely that she is having a heart attack. She sees her PCP tomorrow, who should be able to address high blood pressure +/- pain. If she begins to have chest pain, trouble breathing, severe headache, or vision changes, she would need to be evaluated before then. I do not expect that with the BP measurements provided. Please make her a non urgent follow up with Dr Aundra Dubin when he has something available as it has been nearly a year since we've seen her. If she continues to have high BP after seeing her PCP, she can call back and we can address.

## 2018-06-26 NOTE — ED Provider Notes (Addendum)
[facility] EMERGENCY DEPARTMENT Provider Note   CSN: [id number] Arrival date & time: 06/26/18  0321     History   Chief Complaint Chief Complaint  Patient presents with  . Hypertension    HPI Emily [name] is a 68 y.o. female.  out   Hypertension  This is a chronic problem. The current episode started more than 1 week ago. The problem occurs daily. The problem has been gradually worsening. Pertinent negatives include no chest pain, no abdominal pain, no headaches and no shortness of breath. Nothing aggravates the symptoms. Nothing relieves the symptoms. She has tried nothing for the symptoms.    Past Medical History:  Diagnosis Date  . Anxiety    no anxiety  . CAD (coronary artery disease)   . Cataract   . Chronic insomnia   . Colon polyp   . Depression   . GERD (gastroesophageal reflux disease)   . GERD with esophagitis   . Heart attack ([place]) 1-09  . Heart murmur   . Hyperlipemia   . Hypertension   . Perirectal abscess   . Shingles 08/31/2017   2nd dx with shingles, per pt    Patient Active Problem List   Diagnosis Date Noted  . Visit for preventive health examination 08/11/2015  . Aortic regurgitation 05/29/2013  . Systolic ejection murmur 33/82/5053  . Carotid stenosis 01/26/2011  . BACK PAIN, LEFT 01/19/2009  . ALOPECIA 01/28/2008  . Obesity 10/21/2007  . Hyperlipidemia 07/24/2007  . Coronary atherosclerosis 07/24/2007  . GERD 07/24/2007  . POLYP, COLON 03/26/2007  . ANXIETY DEPRESSION 03/26/2007  . INSOMNIA, CHRONIC 03/26/2007  . HYPERTENSION, MILD 03/26/2007    Past Surgical History:  Procedure Laterality Date  . CESAREAN SECTION     x 2  . COLONOSCOPY    . COLONOSCOPY W/ POLYPECTOMY  2007  . INCISION AND DRAINAGE ABSCESS ANAL    . POLYPECTOMY    . PTCA with stent to third obtuse marginal    . STOMACH SURGERY       OB History   No obstetric history on file.      Home Medications    Prior to Admission  medications   Medication Sig Start Date End Date Taking? Authorizing Provider  acetaminophen (TYLENOL) 650 MG CR tablet Take 650 mg by mouth every 8 (eight) hours as needed for pain (Rarely uses this medication).     [provider]  Acetylcarnitine HCl (ACETYL L-CARNITINE) 250 MG CAPS Take by mouth. 200 mg every other day    [provider]  albuterol (PROVENTIL HFA;VENTOLIN HFA) 108 (90 Base) MCG/ACT inhaler Inhale 2 puffs into the lungs every 6 (six) hours as needed for wheezing or shortness of breath. 06/16/16   Nche, Charlene Brooke, NP  ALPRAZolam Duanne Moron) 0.25 MG tablet Take 1-1.5 tablets by mouth as needed at bedtime 09/13/17   Brunetta Jeans, PA-C  atorvastatin (LIPITOR) 80 MG tablet Take 1 tablet (80 mg total) by mouth daily. 11/15/17   Larey Dresser, MD  b complex vitamins tablet Take 1 tablet by mouth daily.      [provider]  Biotin 300 MCG TABS Take 600 mcg by mouth every other day.      [provider]  Calcium-Magnesium-Zinc 500-250-12.5 MG TABS Take 1 tablet by mouth every other day.      [provider]  Chromium Picolinate 200 MCG TABS Take 1 tablet by mouth every other day.      [provider]  ciprofloxacin (CIPRO) 500 MG tablet Take 1 tablet (500 mg total) by mouth 2 (two) times daily. 03/27/18   Brunetta Jeans, PA-C  clobetasol cream (TEMOVATE) 0.05 % APPLY A THIN LAYER TOPICALLY TO AFFECTED AREA(S) DAILY 06/30/15   [provider]  clopidogrel (PLAVIX) 75 MG tablet TAKE 1 TABLET BY MOUTH EVERY DAY 06/03/18   Larey Dresser, MD  Coenzyme Q10 (COQ10) 200 MG CAPS Take 1 capsule by mouth daily.      [provider]  CRANBERRY JUICE EXTRACT PO Take 500 mg by mouth daily.      [provider]  EPINEPHrine 0.3 mg/0.3 mL IJ SOAJ injection INJECT 0.3 MLS INTO THE MUSCLE ONCE FOR 1 DOSE 09/03/17   [provider]  famotidine (PEPCID AC) 10 MG tablet Take 1 tablet (10 mg total) by mouth 2  (two) times daily. Take  1-2 tablets twice daily 03/19/18   Armbruster, Carlota Raspberry, MD  Flaxseed, Linseed, (FLAX SEED OIL) 1000 MG CAPS Take 1,200 mg by mouth daily.      [provider]  Garlic Oil 585 MG TABS Take 1 tablet by mouth daily.      [provider]  meloxicam (MOBIC) 15 MG tablet Take 1 tablet (15 mg total) by mouth daily. 12/25/17   Brunetta Jeans, PA-C  metoprolol tartrate (LOPRESSOR) 25 MG tablet Take 0.5 tablets (12.5 mg total) by mouth 2 (two) times daily. 09/12/17   Larey Dresser, MD  nitroGLYCERIN (NITROSTAT) 0.4 MG SL tablet Place 1 tablet (0.4 mg total) under the tongue every 5 (five) minutes x 3 doses as needed for chest pain. 12/14/15   Larey Dresser, MD  omega-3 acid ethyl esters (LOVAZA) 1 g capsule TAKE 1 CAPSULE BY MOUTH EVERY DAY 06/03/18   Larey Dresser, MD  Probiotic Product (ALIGN PO) Take 1 capsule by mouth daily.    [provider]  Rhodiola 300 MG CAPS Take 1 capsule by mouth daily.      [provider]  triamcinolone (KENALOG) 0.025 % ointment After completion of 2 weeks of clobetasol. APPLY A THIN LAYER topically TO THE AFFECTED AREA(S) 2 TIMES PER DAY IF NEEDED 12/12/17   [provider]    Family History Family History  Problem Relation Age of Onset  . Skin cancer Mother   . Allergy (severe) Father   . Heart disease Father   . Leukemia Father        CML  . Renal Disease Paternal Grandfather        one removed   . Colon cancer Neg Hx   . Colon polyps Neg Hx   . Rectal cancer Neg Hx   . Stomach cancer Neg Hx     Social History Social History   Tobacco Use  . Smoking status: Former Smoker    Packs/day: 2.00    Years: 20.00    Pack years: 40.00    Types: Cigarettes    Last attempt to quit: 06/19/2006    Years since quitting: 12.0  . Smokeless tobacco: Never Used  Substance Use Topics  . Alcohol use: Not Currently    Alcohol/week: 0.0 standard drinks    Comment: <2 monthly; rare  . Drug use:  No     Allergies   Benzonatate; Chlorpromazine hcl; Codeine; Doxycycline; Hydrocodone; Hydrocodone-acetaminophen; Lovaza [omega-3-acid ethyl esters]; Penicillins; Prochlorperazine edisylate; Promethazine hcl; and Sulfonamide derivatives   Review of Systems Review of Systems  Respiratory: Negative for shortness of breath.  Cardiovascular: Negative for chest pain.  Gastrointestinal: Negative for abdominal pain.  Neurological: Negative for headaches.  All other systems reviewed and are negative.    Physical Exam Updated Vital Signs BP (!) 160/76 (BP Location: Right Arm)   Pulse (!) 50   Temp 98 F (36.7 C) (Oral)   Resp 16   SpO2 96%   Physical Exam Vitals signs and nursing note reviewed.  Constitutional:      Appearance: She is well-developed.  HENT:     Head: Normocephalic and atraumatic.  Eyes:     Extraocular Movements: Extraocular movements intact.     Conjunctiva/sclera: Conjunctivae normal.  Neck:     Musculoskeletal: Normal range of motion.  Cardiovascular:     Rate and Rhythm: Normal rate and regular rhythm.  Pulmonary:     Effort: Pulmonary effort is normal. No respiratory distress.     Breath sounds: Normal breath sounds. No stridor.  Abdominal:     General: There is no distension.  Skin:    General: Skin is warm and dry.  Neurological:     General: No focal deficit present.     Mental Status: She is alert.     ED Treatments / Results  Labs (all labs ordered are listed, but only abnormal results are displayed) Labs Reviewed  BASIC METABOLIC PANEL - Abnormal; Notable for the following components:      Result Value   Glucose, Bld 129 (*)    All other components within normal limits  CBC - Abnormal; Notable for the following components:   WBC 12.5 (*)    RBC 5.12 (*)    Hemoglobin 15.2 (*)    All other components within normal limits  I-STAT TROPONIN, ED   My ECG Read Indication:hypertension EKG was personally contemporaneously reviewed by  myself. Rate: 60 PR Interval: 158 QRS duration: 80 QT/QTC: 428/428 Axis: normal EKG: normal EKG, normal sinus rhythm, unchanged from previous tracings. Other significant findings: none   Procedures Procedures (including critical care time)  Medications Ordered in ED Medications - No data to display   Initial Impression / Assessment and Plan / ED Course  I have reviewed the triage vital signs and the nursing notes.  Pertinent labs & imaging results that were available during my care of the patient were reviewed by me and considered in my medical decision making (see chart for details).   This is a 68 yo F who presents with asymptomatic hypertension. They have had blood pressures as high as 180. Not had any headache, chest pain, shortness of breath, abdominal pain, back pain lower extremity edema or changes in urinary frequency beyond normal. Has been taking blood pressure medications as directed by the primary doctor. Exam does not show any evidence of neurologic changes, fluid overload, vision changes or other acute issues secondary to hypertension. As ACEP guidelines recommend not acutely lowering BP and having close PCP follow-up for asymptomatic hypertension I suggested that they keep a log of their blood pressures for the next 5-7 days and follow-up with her primary doctor for any medication changes. They know to return to the emergency department for any onset of the symptoms as listed above.   Final Clinical Impressions(s) / ED Diagnoses   Final diagnoses:  Hypertension, unspecified type    ED Discharge Orders    None       Brien Lowe, Corene Cornea, MD 06/26/18 1715    Yannis Gumbs, Corene Cornea, MD 08/08/18 [id number]

## 2018-06-26 NOTE — ED Notes (Signed)
Pt verbalized understanding of dc instructions, ambulatory with nad upon discharge.

## 2018-06-27 ENCOUNTER — Ambulatory Visit: Payer: Medicare Other | Admitting: Physician Assistant

## 2018-06-27 NOTE — Telephone Encounter (Signed)
Left VM for pt to call clinic. 

## 2018-06-27 NOTE — Telephone Encounter (Signed)
Left VM for pt 

## 2018-07-02 ENCOUNTER — Encounter: Payer: Self-pay | Admitting: Physician Assistant

## 2018-07-02 ENCOUNTER — Other Ambulatory Visit: Payer: Self-pay

## 2018-07-02 ENCOUNTER — Ambulatory Visit (INDEPENDENT_AMBULATORY_CARE_PROVIDER_SITE_OTHER): Payer: Medicare Other | Admitting: Physician Assistant

## 2018-07-02 ENCOUNTER — Telehealth: Payer: Self-pay | Admitting: Physician Assistant

## 2018-07-02 VITALS — BP 132/82 | HR 61 | Temp 98.0°F | Resp 14 | Ht 62.0 in | Wt 151.0 lb

## 2018-07-02 DIAGNOSIS — I10 Essential (primary) hypertension: Secondary | ICD-10-CM

## 2018-07-02 MED ORDER — LOSARTAN POTASSIUM 25 MG PO TABS: 25.0000 mg | ORAL_TABLET | Freq: Every day | ORAL | 0 refills | Status: DC

## 2018-07-02 MED ORDER — ALPRAZOLAM 0.25 MG PO TABS: ORAL_TABLET | ORAL | 1 refills | Status: DC

## 2018-07-02 NOTE — Progress Notes (Signed)
Patient presents to clinic today c/o 2 weeks of noted elevation in BP despite taking her metoprolol 12.5 mg BID as directed. Notes no change in diet. Had been having significant pain of her L shoulder, originally diagnosed as bursitis but not responding to treatment. Had repeat assessment and diagnosed with frozen shoulder and given cortisone shot. Notes elevation in BP after this with BP max of 180/100. Was seen in the ER on 06/26/18 with negative workup. Was instructed to keep low-salt diet, resume chronic medications and follow-up with PCP. Patient denies chest pain, palpitations, lightheadedness, dizziness, vision changes or frequent headaches. Has BP log to review.  Past Medical History:  Diagnosis Date  . Anxiety    no anxiety  . CAD (coronary artery disease)   . Cataract   . Chronic insomnia   . Colon polyp   . Depression   . GERD (gastroesophageal reflux disease)   . GERD with esophagitis   . Heart attack ([place]) 1-09  . Heart murmur   . Hyperlipemia   . Hypertension   . Perirectal abscess   . Shingles 08/31/2017   2nd dx with shingles, per pt    Current Outpatient Medications on File Prior to Visit  Medication Sig Dispense Refill  . acetaminophen (TYLENOL) 650 MG CR tablet Take 650 mg by mouth every 8 (eight) hours as needed for pain (Rarely uses this medication).     . Acetylcarnitine HCl (ACETYL L-CARNITINE) 250 MG CAPS Take by mouth. 200 mg every other day    . albuterol (PROVENTIL HFA;VENTOLIN HFA) 108 (90 Base) MCG/ACT inhaler Inhale 2 puffs into the lungs every 6 (six) hours as needed for wheezing or shortness of breath. 1 Inhaler 0  . ALPRAZolam (XANAX) 0.25 MG tablet Take 1-1.5 tablets by mouth as needed at bedtime 15 tablet 1  . atorvastatin (LIPITOR) 80 MG tablet Take 1 tablet (80 mg total) by mouth daily. 90 tablet 3  . b complex vitamins tablet Take 1 tablet by mouth daily.      . Biotin 300 MCG TABS Take 600 mcg by mouth every other day.      .  Calcium-Magnesium-Zinc 500-250-12.5 MG TABS Take 1 tablet by mouth every other day.      . Chromium Picolinate 200 MCG TABS Take 1 tablet by mouth every other day.      . clobetasol cream (TEMOVATE) 0.05 % APPLY A THIN LAYER TOPICALLY TO AFFECTED AREA(S) DAILY  0  . clopidogrel (PLAVIX) 75 MG tablet TAKE 1 TABLET BY MOUTH EVERY DAY 90 tablet 0  . Coenzyme Q10 (COQ10) 200 MG CAPS Take 1 capsule by mouth daily.      Marland Kitchen CRANBERRY JUICE EXTRACT PO Take 500 mg by mouth daily.      Marland Kitchen EPINEPHrine 0.3 mg/0.3 mL IJ SOAJ injection INJECT 0.3 MLS INTO THE MUSCLE ONCE FOR 1 DOSE  0  . famotidine (PEPCID AC) 10 MG tablet Take 1 tablet (10 mg total) by mouth 2 (two) times daily. Take  1-2 tablets twice daily 180 tablet 3  . Flaxseed, Linseed, (FLAX SEED OIL) 1000 MG CAPS Take 1,200 mg by mouth daily.      . Garlic Oil 786 MG TABS Take 1 tablet by mouth daily.      . meloxicam (MOBIC) 15 MG tablet Take 1 tablet (15 mg total) by mouth daily. 10 tablet 0  . metoprolol tartrate (LOPRESSOR) 25 MG tablet Take 0.5 tablets (12.5 mg total) by mouth 2 (two) times daily. [place]  tablet 3  . nitroGLYCERIN (NITROSTAT) 0.4 MG SL tablet Place 1 tablet (0.4 mg total) under the tongue every 5 (five) minutes x 3 doses as needed for chest pain. 25 tablet 3  . omega-3 acid ethyl esters (LOVAZA) 1 g capsule TAKE 1 CAPSULE BY MOUTH EVERY DAY 90 capsule 0  . Probiotic Product (ALIGN PO) Take 1 capsule by mouth daily.    . Rhodiola 300 MG CAPS Take 1 capsule by mouth daily.      Marland Kitchen triamcinolone (KENALOG) 0.025 % ointment After completion of 2 weeks of clobetasol. APPLY A THIN LAYER topically TO THE AFFECTED AREA(S) 2 TIMES PER DAY IF NEEDED  3   No current facility-administered medications on file prior to visit.     Allergies  Allergen Reactions  . Benzonatate     Unknown, patient states "it made me sick"  . Chlorpromazine Hcl     unknown  . Codeine     REACTION: causes vomiting  . Doxycycline     "explosive diarrhea" -does not  wish to take this anymore  . Hydrocodone     REACTION: causes vomiing and nausea  . Hydrocodone-Acetaminophen Nausea And Vomiting  . Lovaza [Omega-3-Acid Ethyl Esters] Other (See Comments)    Loose stools with two capsules  . Penicillins     REACTION: causes rash  . Prochlorperazine Edisylate     unknown   . Promethazine Hcl     unknown  . Sulfonamide Derivatives     unknown     Family History  Problem Relation Age of Onset  . Skin cancer Mother   . Allergy (severe) Father   . Heart disease Father   . Leukemia Father        CML  . Renal Disease Paternal Grandfather        one removed   . Colon cancer Neg Hx   . Colon polyps Neg Hx   . Rectal cancer Neg Hx   . Stomach cancer Neg Hx     Social History   Socioeconomic History  . Marital status: Married    Spouse name: Not on file  . Number of children: 2  . Years of education: Not on file  . Highest education level: Not on file  Occupational History  . Occupation: Risk manager  Social Needs  . Financial resource strain: Not on file  . Food insecurity:    Worry: Not on file    Inability: Not on file  . Transportation needs:    Medical: Not on file    Non-medical: Not on file  Tobacco Use  . Smoking status: Former Smoker    Packs/day: 2.00    Years: 20.00    Pack years: 40.00    Types: Cigarettes    Last attempt to quit: 06/19/2006    Years since quitting: 12.0  . Smokeless tobacco: Never Used  Substance and Sexual Activity  . Alcohol use: Not Currently    Alcohol/week: 0.0 standard drinks    Comment: <2 monthly; rare  . Drug use: No  . Sexual activity: Not on file  Lifestyle  . Physical activity:    Days per week: Not on file    Minutes per session: Not on file  . Stress: Not on file  Relationships  . Social connections:    Talks on phone: Not on file    Gets together: Not on file    Attends religious service: Not on file    Active member of club or  organization: Not on file    Attends  meetings of clubs or organizations: Not on file    Relationship status: Not on file  Other Topics Concern  . Not on file  Social History Narrative   Resides in [place] with her husband.    Husband works in [place] and comes home two to three times a year   2 children who go to [place]   Just got accepted to [employer]   Exercise- NO   Review of Systems - See HPI.  All other ROS are negative.  BP 140/76   Temp 98 F (36.7 C) (Oral)   Resp 14   Ht 5\' 2"  (1.575 m)   Wt 151 lb (68.5 kg)   BMI 27.62 kg/m   Physical Exam Vitals signs reviewed.  HENT:     Head: Normocephalic and atraumatic.  Eyes:     Conjunctiva/sclera: Conjunctivae normal.  Neck:     Musculoskeletal: Neck supple.  Cardiovascular:     Rate and Rhythm: Normal rate and regular rhythm.     Pulses: Normal pulses.     Heart sounds: Normal heart sounds.  Pulmonary:     Effort: Pulmonary effort is normal.     Breath sounds: Normal breath sounds.  Neurological:     General: No focal deficit present.     Mental Status: She is alert and oriented to person, place, and time.  Psychiatric:        Mood and Affect: Mood normal.     Recent Results (from the past 2160 hour(s))  Basic metabolic panel     Status: Abnormal   Collection Time: 06/26/18  4:01 AM  Result Value Ref Range   Sodium 139 135 - 145 mmol/L   Potassium 3.7 3.5 - 5.1 mmol/L   Chloride 105 98 - 111 mmol/L   CO2 24 22 - 32 mmol/L   Glucose, Bld 129 (H) 70 - 99 mg/dL   BUN 19 8 - 23 mg/dL   Creatinine, Ser 0.65 0.44 - 1.00 mg/dL   Calcium 9.5 8.9 - 10.3 mg/dL   GFR calc non Af Amer >60 >60 mL/min   GFR calc Af Amer >60 >60 mL/min   Anion gap 10 5 - 15    Comment: Performed at [facility], [place] [address] [place], [place] [address]  CBC     Status: Abnormal   Collection Time: 06/26/18  4:01 AM  Result Value Ref Range   WBC 12.5 (H) 4.0 - 10.5 K/uL   RBC 5.12 (H) 3.87 - 5.11 MIL/uL   Hemoglobin 15.2 (H) 12.0 - 15.0 g/dL   HCT 46.0 36.0 - 46.0  %   MCV 89.8 80.0 - 100.0 fL   MCH 29.7 26.0 - 34.0 pg   MCHC 33.0 30.0 - 36.0 g/dL   RDW 13.1 11.5 - 15.5 %   Platelets 356 150 - 400 K/uL   nRBC 0.0 0.0 - 0.2 %    Comment: Performed at [facility], [place] [address] [place], [place] [address]  I-stat troponin, ED     Status: None   Collection Time: 06/26/18  4:09 AM  Result Value Ref Range   Troponin i, poc 0.00 0.00 - 0.08 ng/mL   Comment 3            Comment: Due to the release kinetics of cTnI, a negative result within the first hours of the onset of symptoms does not rule out myocardial infarction with certainty. If myocardial  infarction is still suspected, repeat the test at appropriate intervals.    Assessment/Plan: 1. HYPERTENSION, MILD Short-term will add-on losartan 25 mg each evening. Continue Metoprolol BID. Cannot increase further due to risk for sign bradycardia as resting HR 50-low 60s. Continue DASH diet. Suspect BP will come back down to baseline once steroid is out of her system. Continue home amb BP measurements. Close follow-up scheduled.  - losartan (COZAAR) 25 MG tablet; Take 1 tablet (25 mg total) by mouth daily.  Dispense: 30 tablet; Refill: 0   Leeanne Rio, PA-C

## 2018-07-02 NOTE — Patient Instructions (Signed)
Please keep well-hydrated and get plenty of rest. Giving the elevated BP due to cortisone injection, I am giving you a very low dose of another BP medication to take in the evening. This will most likely be a short-term medication. Keep a low salt diet. Continue other medications as directed.  Follow-up with me in 2 weeks. You are overdue for a checkup and labs. We will do this at your visit in 2 weeks. (30-minute appt)

## 2018-07-02 NOTE — Telephone Encounter (Signed)
Patient notified limited number of pills due to upcoming appointment- even though she has not filled it in some time- follow up is required for this medication. She understands and will be at her appointment.

## 2018-07-02 NOTE — Telephone Encounter (Signed)
Copied from [place] [id number]. Topic: Quick Communication - Rx Refill/Question >> Jul 02, 2018  3:51 PM Windy Kalata wrote: Medication: ALPRAZolam Duanne Moron) 0.25 MG tablet  Has the patient contacted their pharmacy? Yes.   (Agent: If no, request that the patient contact the pharmacy for the refill.) (Agent: If yes, when and what did the pharmacy advise?) Patient is asking why there were only 15 pills called in?   Preferred Pharmacy (with phone number or street name): [facility], [place] - [place], [place] - [address] [phone] (Phone) [phone] (Fax)    Agent: Please be advised that RX refills may take up to 3 business days. We ask that you follow-up with your pharmacy.

## 2018-07-05 ENCOUNTER — Encounter: Payer: Self-pay | Admitting: Physician Assistant

## 2018-07-05 ENCOUNTER — Ambulatory Visit (INDEPENDENT_AMBULATORY_CARE_PROVIDER_SITE_OTHER): Payer: Medicare Other | Admitting: Physician Assistant

## 2018-07-05 ENCOUNTER — Other Ambulatory Visit: Payer: Self-pay

## 2018-07-05 VITALS — BP 140/82 | HR 54 | Temp 97.7°F | Resp 14 | Ht 62.0 in | Wt 150.0 lb

## 2018-07-05 DIAGNOSIS — B029 Zoster without complications: Secondary | ICD-10-CM | POA: Diagnosis not present

## 2018-07-05 MED ORDER — VALACYCLOVIR HCL 1 G PO TABS: 1000.0000 mg | ORAL_TABLET | Freq: Three times a day (TID) | ORAL | 0 refills | Status: DC

## 2018-07-05 NOTE — Progress Notes (Signed)
Patient presents to clinic today c/o < 24 hours of a burning and tingling rash of R buttock. Patient with history of shingles and notes this feels similar. Denies fever, chills. Denies trauma or injury to the area. Notes skin of buttock and upper leg is very sensitive.   Past Medical History:  Diagnosis Date  . Anxiety    no anxiety  . CAD (coronary artery disease)   . Cataract   . Chronic insomnia   . Colon polyp   . Depression   . GERD (gastroesophageal reflux disease)   . GERD with esophagitis   . Heart attack ([place]) 1-09  . Heart murmur   . Hyperlipemia   . Hypertension   . Perirectal abscess   . Shingles 08/31/2017   2nd dx with shingles, per pt    Current Outpatient Medications on File Prior to Visit  Medication Sig Dispense Refill  . acetaminophen (TYLENOL) 650 MG CR tablet Take 650 mg by mouth every 8 (eight) hours as needed for pain (Rarely uses this medication).     . Acetylcarnitine HCl (ACETYL L-CARNITINE) 250 MG CAPS Take by mouth. 200 mg every other day    . albuterol (PROVENTIL HFA;VENTOLIN HFA) 108 (90 Base) MCG/ACT inhaler Inhale 2 puffs into the lungs every 6 (six) hours as needed for wheezing or shortness of breath. 1 Inhaler 0  . ALPRAZolam (XANAX) 0.25 MG tablet Take 1-1.5 tablets by mouth as needed at bedtime 15 tablet 1  . atorvastatin (LIPITOR) 80 MG tablet Take 1 tablet (80 mg total) by mouth daily. 90 tablet 3  . b complex vitamins tablet Take 1 tablet by mouth daily.      . Biotin 300 MCG TABS Take 600 mcg by mouth every other day.      . Calcium-Magnesium-Zinc 500-250-12.5 MG TABS Take 1 tablet by mouth every other day.      . Chromium Picolinate 200 MCG TABS Take 1 tablet by mouth every other day.      . clobetasol cream (TEMOVATE) 0.05 % APPLY A THIN LAYER TOPICALLY TO AFFECTED AREA(S) DAILY  0  . clopidogrel (PLAVIX) 75 MG tablet TAKE 1 TABLET BY MOUTH EVERY DAY 90 tablet 0  . Coenzyme Q10 (COQ10) 200 MG CAPS Take 1 capsule by mouth daily.      Marland Kitchen  CRANBERRY JUICE EXTRACT PO Take 500 mg by mouth daily.      Marland Kitchen EPINEPHrine 0.3 mg/0.3 mL IJ SOAJ injection INJECT 0.3 MLS INTO THE MUSCLE ONCE FOR 1 DOSE  0  . famotidine (PEPCID AC) 10 MG tablet Take 1 tablet (10 mg total) by mouth 2 (two) times daily. Take  1-2 tablets twice daily 180 tablet 3  . finasteride (PROSCAR) 5 MG tablet Take 2.5 mg by mouth daily.    . Flaxseed, Linseed, (FLAX SEED OIL) 1000 MG CAPS Take 1,200 mg by mouth daily.      . Garlic Oil 086 MG TABS Take 1 tablet by mouth daily.      Marland Kitchen losartan (COZAAR) 25 MG tablet Take 1 tablet (25 mg total) by mouth daily. 30 tablet 0  . meloxicam (MOBIC) 15 MG tablet Take 1 tablet (15 mg total) by mouth daily. 10 tablet 0  . metoprolol tartrate (LOPRESSOR) 25 MG tablet Take 0.5 tablets (12.5 mg total) by mouth 2 (two) times daily. 90 tablet 3  . nitroGLYCERIN (NITROSTAT) 0.4 MG SL tablet Place 1 tablet (0.4 mg total) under the tongue every 5 (five) minutes x 3 doses  as needed for chest pain. 25 tablet 3  . omega-3 acid ethyl esters (LOVAZA) 1 g capsule TAKE 1 CAPSULE BY MOUTH EVERY DAY 90 capsule 0  . Probiotic Product (ALIGN PO) Take 1 capsule by mouth daily.    . Rhodiola 300 MG CAPS Take 1 capsule by mouth daily.      Marland Kitchen triamcinolone (KENALOG) 0.025 % ointment After completion of 2 weeks of clobetasol. APPLY A THIN LAYER topically TO THE AFFECTED AREA(S) 2 TIMES PER DAY IF NEEDED  3   No current facility-administered medications on file prior to visit.     Allergies  Allergen Reactions  . Benzonatate     Unknown, patient states "it made me sick"  . Chlorpromazine Hcl     unknown  . Codeine     REACTION: causes vomiting  . Doxycycline     "explosive diarrhea" -does not wish to take this anymore  . Hydrocodone     REACTION: causes vomiing and nausea  . Hydrocodone-Acetaminophen Nausea And Vomiting  . Lovaza [Omega-3-Acid Ethyl Esters] Other (See Comments)    Loose stools with two capsules  . Penicillins     REACTION: causes  rash  . Prochlorperazine Edisylate     unknown   . Promethazine Hcl     unknown  . Sulfonamide Derivatives     unknown     Family History  Problem Relation Age of Onset  . Skin cancer Mother   . Allergy (severe) Father   . Heart disease Father   . Leukemia Father        CML  . Renal Disease Paternal Grandfather        one removed   . Colon cancer Neg Hx   . Colon polyps Neg Hx   . Rectal cancer Neg Hx   . Stomach cancer Neg Hx     Social History   Socioeconomic History  . Marital status: Married    Spouse name: Not on file  . Number of children: 2  . Years of education: Not on file  . Highest education level: Not on file  Occupational History  . Occupation: Risk manager  Social Needs  . Financial resource strain: Not on file  . Food insecurity:    Worry: Not on file    Inability: Not on file  . Transportation needs:    Medical: Not on file    Non-medical: Not on file  Tobacco Use  . Smoking status: Former Smoker    Packs/day: 2.00    Years: 20.00    Pack years: 40.00    Types: Cigarettes    Last attempt to quit: 06/19/2006    Years since quitting: 12.0  . Smokeless tobacco: Never Used  Substance and Sexual Activity  . Alcohol use: Not Currently    Alcohol/week: 0.0 standard drinks    Comment: <2 monthly; rare  . Drug use: No  . Sexual activity: Not on file  Lifestyle  . Physical activity:    Days per week: Not on file    Minutes per session: Not on file  . Stress: Not on file  Relationships  . Social connections:    Talks on phone: Not on file    Gets together: Not on file    Attends religious service: Not on file    Active member of club or organization: Not on file    Attends meetings of clubs or organizations: Not on file    Relationship status: Not on file  Other  Topics Concern  . Not on file  Social History Narrative   Resides in [place] with her husband.    Husband works in [place] and comes home two to three times a year   2  children who go to [place]   Just got accepted to [employer]   Exercise- NO   Review of Systems - See HPI.  All other ROS are negative.  BP 140/82   Pulse (!) 54   Temp 97.7 F (36.5 C) (Oral)   Resp 14   Ht 5\' 2"  (1.575 m)   Wt 150 lb (68 kg)   SpO2 98%   BMI 27.44 kg/m   Physical Exam Vitals signs reviewed.  Constitutional:      Appearance: Normal appearance.  HENT:     Head: Normocephalic and atraumatic.  Neck:     Musculoskeletal: Neck supple.  Cardiovascular:     Rate and Rhythm: Normal rate and regular rhythm.     Pulses: Normal pulses.     Heart sounds: Normal heart sounds.  Pulmonary:     Effort: Pulmonary effort is normal.  Skin:      Neurological:     Mental Status: She is alert.     Recent Results (from the past 2160 hour(s))  Basic metabolic panel     Status: Abnormal   Collection Time: 06/26/18  4:01 AM  Result Value Ref Range   Sodium 139 135 - 145 mmol/L   Potassium 3.7 3.5 - 5.1 mmol/L   Chloride 105 98 - 111 mmol/L   CO2 24 22 - 32 mmol/L   Glucose, Bld 129 (H) 70 - 99 mg/dL   BUN 19 8 - 23 mg/dL   Creatinine, Ser 0.65 0.44 - 1.00 mg/dL   Calcium 9.5 8.9 - 10.3 mg/dL   GFR calc non Af Amer >60 >60 mL/min   GFR calc Af Amer >60 >60 mL/min   Anion gap 10 5 - 15    Comment: Performed at [facility], [place] [address] [place], [place] [address]  CBC     Status: Abnormal   Collection Time: 06/26/18  4:01 AM  Result Value Ref Range   WBC 12.5 (H) 4.0 - 10.5 K/uL   RBC 5.12 (H) 3.87 - 5.11 MIL/uL   Hemoglobin 15.2 (H) 12.0 - 15.0 g/dL   HCT 46.0 36.0 - 46.0 %   MCV 89.8 80.0 - 100.0 fL   MCH 29.7 26.0 - 34.0 pg   MCHC 33.0 30.0 - 36.0 g/dL   RDW 13.1 11.5 - 15.5 %   Platelets 356 150 - 400 K/uL   nRBC 0.0 0.0 - 0.2 %    Comment: Performed at [facility], [place] [address], [place], [place] [address]  I-stat troponin, ED     Status: None   Collection Time: 06/26/18  4:09 AM  Result Value Ref Range   Troponin i, poc 0.00 0.00 - 0.08  ng/mL   Comment 3            Comment: Due to the release kinetics of cTnI, a negative result within the first hours of the onset of symptoms does not rule out myocardial infarction with certainty. If myocardial infarction is still suspected, repeat the test at appropriate intervals.     Assessment/Plan: 1. Herpes zoster without complication Mild and localized. No complications. STart Valtrex 1 g TID x 7 days. Supportive measures and OTC medications reviewed. - valACYclovir (VALTREX) 1000 MG tablet; Take 1 tablet (1,000  mg total) by mouth 3 (three) times daily.  Dispense: 21 tablet; Refill: 0   Leeanne Rio, PA-C

## 2018-07-05 NOTE — Patient Instructions (Signed)
Please take the Valtrex as directed, three times daily for 7 days. Keep the rash covered. OTC pain medication if needed. Apply topical lidocaine to the area if needed.    Shingles  Shingles is an infection. It gives you a painful skin rash and blisters that have fluid in them. Shingles is caused by the same germ (virus) that causes chickenpox. Shingles only happens in people who:  Have had chickenpox.  Have been given a shot of medicine (vaccine) to protect against chickenpox. Shingles is rare in this group. The first symptoms of shingles may be itching, tingling, or pain in an area on your skin. A rash will show on your skin a few days or weeks later. The rash is likely to be on one side of your body. The rash usually has a shape like a belt or a band. Over time, the rash turns into fluid-filled blisters. The blisters will break open, change into scabs, and dry up. Medicines may:  Help with pain and itching.  Help you get better sooner.  Help to prevent long-term problems. Follow these instructions at home: Medicines  Take over-the-counter and prescription medicines only as told by your doctor.  Put on an anti-itch cream or numbing cream where you have a rash, blisters, or scabs. Do this as told by your doctor. Helping with itching and discomfort   Put cold, wet cloths (cold compresses) on the area of the rash or blisters as told by your doctor.  Cool baths can help you feel better. Try adding baking soda or dry oatmeal to the water to lessen itching. Do not bathe in hot water. Blister and rash care  Keep your rash covered with a loose bandage (dressing).  Wear loose clothing that does not rub on your rash.  Keep your rash and blisters clean. To do this, wash the area with mild soap and cool water as told by your doctor.  Check your rash every day for signs of infection. Check for: ? More redness, swelling, or pain. ? Fluid or blood. ? Warmth. ? Pus or a bad smell.  Do  not scratch your rash. Do not pick at your blisters. To help you to not scratch: ? Keep your fingernails clean and cut short. ? Wear gloves or mittens when you sleep, if scratching is a problem. General instructions  Rest as told by your doctor.  Keep all follow-up visits as told by your doctor. This is important.  Wash your hands often with soap and water. If soap and water are not available, use hand sanitizer. Doing this lowers your chance of getting a skin infection caused by germs (bacteria).  Your infection can cause chickenpox in people who have never had chickenpox or never got a shot of chickenpox vaccine. If you have blisters that did not change into scabs yet, try not to touch other people or be around other people, especially: ? Babies. ? Pregnant women. ? Children who have areas of red, itchy, or rough skin (eczema). ? Very old people who have transplants. ? People who have a long-term (chronic) sickness, like cancer or AIDS. Contact a doctor if:  Your pain does not get better with medicine.  Your pain does not get better after the rash heals.  You have any signs of infection in the rash area. These signs include: ? More redness, swelling, or pain around the rash. ? Fluid or blood coming from the rash. ? The rash area feeling warm to the touch. ? Pus  or a bad smell coming from the rash. Get help right away if:  The rash is on your face or nose.  You have pain in your face or pain by your eye.  You lose feeling on one side of your face.  You have trouble seeing.  You have ear pain, or you have ringing in your ear.  You have a loss of taste.  Your condition gets worse. Summary  Shingles gives you a painful skin rash and blisters that have fluid in them.  Shingles is an infection. It is caused by the same germ (virus) that causes chickenpox.  Keep your rash covered with a loose bandage (dressing). Wear loose clothing that does not rub on your rash.  If you  have blisters that did not change into scabs yet, try not to touch other people or be around people. This information is not intended to replace advice given to you by your health care provider. Make sure you discuss any questions you have with your health care provider. Document Released: 11/22/2007 Document Revised: 02/07/2017 Document Reviewed: 02/07/2017 Elsevier Interactive Patient Education  2019 [employer].

## 2018-07-16 ENCOUNTER — Ambulatory Visit: Payer: Medicare Other | Admitting: Physician Assistant

## 2018-07-22 DIAGNOSIS — M25512 Pain in left shoulder: Secondary | ICD-10-CM | POA: Diagnosis not present

## 2018-07-22 DIAGNOSIS — M25612 Stiffness of left shoulder, not elsewhere classified: Secondary | ICD-10-CM | POA: Diagnosis not present

## 2018-07-23 ENCOUNTER — Ambulatory Visit (INDEPENDENT_AMBULATORY_CARE_PROVIDER_SITE_OTHER): Payer: Medicare Other | Admitting: Physician Assistant

## 2018-07-23 ENCOUNTER — Other Ambulatory Visit: Payer: Self-pay

## 2018-07-23 ENCOUNTER — Encounter: Payer: Self-pay | Admitting: Physician Assistant

## 2018-07-23 VITALS — BP 116/72 | HR 60 | Temp 97.9°F | Resp 14 | Ht 62.0 in | Wt 152.0 lb

## 2018-07-23 DIAGNOSIS — Z1231 Encounter for screening mammogram for malignant neoplasm of breast: Secondary | ICD-10-CM

## 2018-07-23 DIAGNOSIS — Z0000 Encounter for general adult medical examination without abnormal findings: Secondary | ICD-10-CM | POA: Diagnosis not present

## 2018-07-23 DIAGNOSIS — I10 Essential (primary) hypertension: Secondary | ICD-10-CM | POA: Diagnosis not present

## 2018-07-23 NOTE — Progress Notes (Signed)
Subjective:   Emily [name] is a 68 y.o. female who presents for Medicare Annual (Subsequent) preventive examination.  Review of Systems:  Review of Systems  Constitutional: Negative for fever and weight loss.  HENT: Negative for ear discharge, ear pain, hearing loss and tinnitus.   Eyes: Negative for blurred vision, double vision, photophobia and pain.  Respiratory: Negative for cough and shortness of breath.   Cardiovascular: Negative for chest pain and palpitations.  Gastrointestinal: Negative for abdominal pain, blood in stool, constipation, diarrhea, heartburn, melena, nausea and vomiting.  Genitourinary: Negative for dysuria, flank pain, frequency, hematuria and urgency.  Musculoskeletal: Negative for falls.  Neurological: Negative for dizziness, loss of consciousness and headaches.  Endo/Heme/Allergies: Negative for environmental allergies.  Psychiatric/Behavioral: Negative for depression, hallucinations, substance abuse and suicidal ideas. The patient is not nervous/anxious and does not have insomnia.     Cardiac Risk Factors include: advanced age (>12men, >68 women);hypertension;sedentary lifestyleHTN: Metoprolol and Losartan - doing well on them with some "spikes" in the morning (she is taking them in the evening)  Denies any side effects- dizziness, SOB, lightheadedness, swelling or chest pain.      Objective:     Vitals: BP 116/72   Pulse 60   Temp 97.9 F (36.6 C) (Oral)   Resp 14   Ht 5\' 2"  (1.575 m)   Wt 152 lb (68.9 kg)   SpO2 98%   BMI 27.80 kg/m   Body mass index is 27.8 kg/m.  Advanced Directives 07/23/2018 09/03/2017 07/16/2015 06/10/2014  Does Patient Have a Medical Advance Directive? Yes No No No  Type of Paramedic of [place];Living will - - -  Does patient want to make changes to medical advance directive? No - Patient declined - - -  Copy of [place] in Chart? No - copy requested - - -  Would patient  like information on creating a medical advance directive? - - No - patient declined information No - patient declined information    Tobacco Social History   Tobacco Use  Smoking Status Former Smoker  . Packs/day: 2.00  . Years: 20.00  . Pack years: 40.00  . Types: Cigarettes  . Last attempt to quit: 06/19/2006  . Years since quitting: 12.1  Smokeless Tobacco Never Used     Counseling given: Yes   Clinical Intake:  Pre-visit preparation completed: No  Pain : No/denies pain     Nutritional Status: BMI 25 -29 Overweight Diabetes: No  How often do you need to have someone help you when you read instructions, pamphlets, or other written materials from your doctor or pharmacy?: 1 - Never  Interpreter Needed?: No     Past Medical History:  Diagnosis Date  . Anxiety    no anxiety  . CAD (coronary artery disease)   . Cataract   . Chronic insomnia   . Colon polyp   . Depression   . GERD (gastroesophageal reflux disease)   . GERD with esophagitis   . Heart attack ([place]) 1-09  . Heart murmur   . Hyperlipemia   . Hypertension   . Perirectal abscess   . Shingles 08/31/2017   2nd dx with shingles, per pt   Past Surgical History:  Procedure Laterality Date  . CESAREAN SECTION     x 2  . COLONOSCOPY    . COLONOSCOPY W/ POLYPECTOMY  2007  . INCISION AND DRAINAGE ABSCESS ANAL    . POLYPECTOMY    . PTCA with stent  to third obtuse marginal    . STOMACH SURGERY     Family History  Problem Relation Age of Onset  . Skin cancer Mother   . Allergy (severe) Father   . Heart disease Father   . Leukemia Father        CML  . Renal Disease Paternal Grandfather        one removed   . Colon cancer Neg Hx   . Colon polyps Neg Hx   . Rectal cancer Neg Hx   . Stomach cancer Neg Hx    Social History   Socioeconomic History  . Marital status: Married    Spouse name: Not on file  . Number of children: 2  . Years of education: Not on file  . Highest education level: Not  on file  Occupational History  . Occupation: Risk manager  Social Needs  . Financial resource strain: Not on file  . Food insecurity:    Worry: Not on file    Inability: Not on file  . Transportation needs:    Medical: Not on file    Non-medical: Not on file  Tobacco Use  . Smoking status: Former Smoker    Packs/day: 2.00    Years: 20.00    Pack years: 40.00    Types: Cigarettes    Last attempt to quit: 06/19/2006    Years since quitting: 12.1  . Smokeless tobacco: Never Used  Substance and Sexual Activity  . Alcohol use: Not Currently    Alcohol/week: 0.0 standard drinks    Comment: <2 monthly; rare  . Drug use: No  . Sexual activity: Not on file  Lifestyle  . Physical activity:    Days per week: Not on file    Minutes per session: Not on file  . Stress: Not on file  Relationships  . Social connections:    Talks on phone: Not on file    Gets together: Not on file    Attends religious service: Not on file    Active member of club or organization: Not on file    Attends meetings of clubs or organizations: Not on file    Relationship status: Not on file  Other Topics Concern  . Not on file  Social History Narrative   Resides in [place] with her husband.    Husband works in [place] and comes home two to three times a year   2 children who go to [facility]   Just got accepted to [employer]   Exercise- NO    Outpatient Encounter Medications as of 07/23/2018  Medication Sig  . acetaminophen (TYLENOL) 650 MG CR tablet Take 650 mg by mouth every 8 (eight) hours as needed for pain (Rarely uses this medication).   . Acetylcarnitine HCl (ACETYL L-CARNITINE) 250 MG CAPS Take by mouth. 200 mg every other day  . albuterol (PROVENTIL HFA;VENTOLIN HFA) 108 (90 Base) MCG/ACT inhaler Inhale 2 puffs into the lungs every 6 (six) hours as needed for wheezing or shortness of breath.  . ALPRAZolam (XANAX) 0.25 MG tablet Take 1-1.5 tablets by mouth as needed at bedtime  . atorvastatin  (LIPITOR) 80 MG tablet Take 1 tablet (80 mg total) by mouth daily.  Marland Kitchen b complex vitamins tablet Take 1 tablet by mouth daily.    . Biotin 300 MCG TABS Take 600 mcg by mouth every other day.    . Calcium-Magnesium-Zinc 500-250-12.5 MG TABS Take 1 tablet by mouth every other day.    . Chromium Picolinate  200 MCG TABS Take 1 tablet by mouth every other day.    . clobetasol cream (TEMOVATE) 0.05 % APPLY A THIN LAYER TOPICALLY TO AFFECTED AREA(S) DAILY  . clopidogrel (PLAVIX) 75 MG tablet TAKE 1 TABLET BY MOUTH EVERY DAY  . Coenzyme Q10 (COQ10) 200 MG CAPS Take 1 capsule by mouth daily.    Marland Kitchen CRANBERRY JUICE EXTRACT PO Take 500 mg by mouth daily.    Marland Kitchen EPINEPHrine 0.3 mg/0.3 mL IJ SOAJ injection INJECT 0.3 MLS INTO THE MUSCLE ONCE FOR 1 DOSE  . famotidine (PEPCID AC) 10 MG tablet Take 1 tablet (10 mg total) by mouth 2 (two) times daily. Take  1-2 tablets twice daily  . finasteride (PROSCAR) 5 MG tablet Take 2.5 mg by mouth daily.  . Flaxseed, Linseed, (FLAX SEED OIL) 1000 MG CAPS Take 1,200 mg by mouth daily.    . Garlic Oil 518 MG TABS Take 1 tablet by mouth daily.    Marland Kitchen losartan (COZAAR) 25 MG tablet Take 1 tablet (25 mg total) by mouth daily.  . meloxicam (MOBIC) 15 MG tablet Take 1 tablet (15 mg total) by mouth daily.  . metoprolol tartrate (LOPRESSOR) 25 MG tablet Take 0.5 tablets (12.5 mg total) by mouth 2 (two) times daily.  . nitroGLYCERIN (NITROSTAT) 0.4 MG SL tablet Place 1 tablet (0.4 mg total) under the tongue every 5 (five) minutes x 3 doses as needed for chest pain.  Marland Kitchen omega-3 acid ethyl esters (LOVAZA) 1 g capsule TAKE 1 CAPSULE BY MOUTH EVERY DAY  . Probiotic Product (ALIGN PO) Take 1 capsule by mouth daily.  . Rhodiola 300 MG CAPS Take 1 capsule by mouth daily.    Marland Kitchen triamcinolone (KENALOG) 0.025 % ointment After completion of 2 weeks of clobetasol. APPLY A THIN LAYER topically TO THE AFFECTED AREA(S) 2 TIMES PER DAY IF NEEDED  . valACYclovir (VALTREX) 1000 MG tablet Take 1 tablet  (1,000 mg total) by mouth 3 (three) times daily.   No facility-administered encounter medications on file as of 07/23/2018.     Activities of Daily Living In your present state of health, do you have any difficulty performing the following activities: 07/23/2018 07/23/2018  Hearing? N N  Vision? N N  Difficulty concentrating or making decisions? N N  Walking or climbing stairs? N N  Dressing or bathing? N N  Doing errands, shopping? N N  Preparing Food and eating ? N -  Using the Toilet? N -  In the past six months, have you accidently leaked urine? N -  Do you have problems with loss of bowel control? N -  Managing your Medications? N -  Managing your Finances? N -  Housekeeping or managing your Housekeeping? N -  Some recent data might be hidden    Patient Care Team: Delorse Limber as PCP - General (Physician Assistant)    Assessment:   This is a routine wellness examination for Elliott.  Exercise Activities and Dietary recommendations Current Exercise Habits: Home exercise routine  Goals    . LDL CALC < 70       Fall Risk Fall Risk  07/23/2018 07/06/2017  Falls in the past year? 0 Yes  Number falls in past yr: 0 1  Injury with Fall? 0 Yes  Follow up Falls evaluation completed Follow up appointment   Is the patient's home free of loose throw rugs in walkways, pet beds, electrical cords, etc?   yes      Grab bars in the  bathroom? no      Handrails on the stairs?   no      Adequate lighting?   yes   Depression Screen PHQ 2/9 Scores 07/23/2018 07/06/2017 02/05/2017  PHQ - 2 Score 0 0 0  PHQ- 9 Score 0 - 0     Cognitive Function MMSE - Mini Mental State Exam 07/23/2018  Orientation to time 5  Orientation to Place 5  Registration 3  Attention/ Calculation 5  Recall 3  Language- name 2 objects 2  Language- repeat 1  Language- follow 3 step command 3  Language- read & follow direction 1  Write a sentence 1  Copy design 1  Total score 30         Immunization History  Administered Date(s) Administered  . Td 03/11/2007, 09/17/2007    Qualifies for Shingles Vaccine? Yes. Is just getting over acute shingles outbreak so will have to return for Shingrix once healed.  Screening Tests Health Maintenance  Topic Date Due  . Hepatitis C Screening  February 16, 1951  . PNA vac Low Risk Adult (1 of 2 - PCV13) 05/01/2016  . MAMMOGRAM  01/17/2018  . INFLUENZA VACCINE  09/17/2018 (Originally 01/17/2018)  . DTaP/Tdap/Td (1 - Tdap) 07/03/2019 (Originally [birth year]01/1962)  . TETANUS/TDAP  07/03/2019 (Originally 09/16/2017)  . COLONOSCOPY  07/29/2018  . DEXA SCAN  Completed    Cancer Screenings: Lung: Low Dose CT Chest recommended if Age 96-80 years, 30 pack-year currently smoking OR have quit w/in 15years. Patient does not qualify. Breast:  Up to date on Mammogram? No  Order placed Colorectal: up-to-date.     Plan:   (1) Medicare Wellness, Subsequent  I have personally reviewed and noted the following in the patient's chart:   . Medical and social history . Use of alcohol, tobacco or illicit drugs  . Current medications and supplements . Functional ability and status . Nutritional status . Physical activity . Advanced directives . List of other physicians . Hospitalizations, surgeries, and ER visits in previous 12 months . Vitals . Screenings to include cognitive, depression, and falls . Referrals and appointments  In addition, I have reviewed and discussed with patient certain preventive protocols, quality metrics, and best practice recommendations. A written personalized care plan for preventive services as well as general preventive health recommendations were provided to patient.   BP stable today. Repeat labs (she will return fasting). Can hold losartan for a few days as she feels she may no longer need this now that steroids are out of her system (from shoulder injection). Close eye on BP. She is to call Friday to let me review her BP  log.  Screening mammogram ordered.  Leeanne Rio, PA-C  07/23/2018

## 2018-07-23 NOTE — Patient Instructions (Signed)
Please go to the lab for blood work.   Our office will call you with your results unless you have chosen to receive results via MyChart.  If your blood work is normal we will follow-up each year for physicals and as scheduled for chronic medical problems.  If anything is abnormal we will treat accordingly and get you in for a follow-up.  Hold the losartan for now. Continue the Metoprolol twice daily as directed. Keep your low-sodium diet.  Call me Friday to let me know how BP are running.   Preventive Care 68 Years and Older, Female Preventive care refers to lifestyle choices and visits with your health care provider that can promote health and wellness. What does preventive care include?  A yearly physical exam. This is also called an annual well check.  Dental exams once or twice a year.  Routine eye exams. Ask your health care provider how often you should have your eyes checked.  Personal lifestyle choices, including: ? Daily care of your teeth and gums. ? Regular physical activity. ? Eating a healthy diet. ? Avoiding tobacco and drug use. ? Limiting alcohol use. ? Practicing safe sex. ? Taking low-dose aspirin every day. ? Taking vitamin and mineral supplements as recommended by your health care provider. What happens during an annual well check? The services and screenings done by your health care provider during your annual well check will depend on your age, overall health, lifestyle risk factors, and family history of disease. Counseling Your health care provider may ask you questions about your:  Alcohol use.  Tobacco use.  Drug use.  Emotional well-being.  Home and relationship well-being.  Sexual activity.  Eating habits.  History of falls.  Memory and ability to understand (cognition).  Work and work Statistician.  Reproductive health.  Screening You may have the following tests or measurements:  Height, weight, and BMI.  Blood  pressure.  Lipid and cholesterol levels. These may be checked every 5 years, or more frequently if you are over 42 years old.  Skin check.  Lung cancer screening. You may have this screening every year starting at age 68 if you have a 30-pack-year history of smoking and currently smoke or have quit within the past 15 years.  Colorectal cancer screening. All adults should have this screening starting at age 68 and continuing until age 15. You will have tests every 1-10 years, depending on your results and the type of screening test. People at increased risk should start screening at an earlier age. Screening tests may include: ? Guaiac-based fecal occult blood testing. ? Fecal immunochemical test (FIT). ? Stool DNA test. ? Virtual colonoscopy. ? Sigmoidoscopy. During this test, a flexible tube with a tiny camera (sigmoidoscope) is used to examine your rectum and lower colon. The sigmoidoscope is inserted through your anus into your rectum and lower colon. ? Colonoscopy. During this test, a long, thin, flexible tube with a tiny camera (colonoscope) is used to examine your entire colon and rectum.  Hepatitis C blood test.  Hepatitis B blood test.  Sexually transmitted disease (STD) testing.  Diabetes screening. This is done by checking your blood sugar (glucose) after you have not eaten for a while (fasting). You may have this done every 1-3 years.  Bone density scan. This is done to screen for osteoporosis. You may have this done starting at age 68.  Mammogram. This may be done every 1-2 years. Talk to your health care provider about how often you should  have regular mammograms. Talk with your health care provider about your test results, treatment options, and if necessary, the need for more tests. Vaccines Your health care provider may recommend certain vaccines, such as:  Influenza vaccine. This is recommended every year.  Tetanus, diphtheria, and acellular pertussis (Tdap, Td)  vaccine. You may need a Td booster every 10 years.  Varicella vaccine. You may need this if you have not been vaccinated.  Zoster vaccine. You may need this after age 32.  Measles, mumps, and rubella (MMR) vaccine. You may need at least one dose of MMR if you were born in 1957 or later. You may also need a second dose.  Pneumococcal 13-valent conjugate (PCV13) vaccine. One dose is recommended after age 34.  Pneumococcal polysaccharide (PPSV23) vaccine. One dose is recommended after age 95.  Meningococcal vaccine. You may need this if you have certain conditions.  Hepatitis A vaccine. You may need this if you have certain conditions or if you travel or work in places where you may be exposed to hepatitis A.  Hepatitis B vaccine. You may need this if you have certain conditions or if you travel or work in places where you may be exposed to hepatitis B.  Haemophilus influenzae type b (Hib) vaccine. You may need this if you have certain conditions. Talk to your health care provider about which screenings and vaccines you need and how often you need them. This information is not intended to replace advice given to you by your health care provider. Make sure you discuss any questions you have with your health care provider. Document Released: 07/02/2015 Document Revised: 07/26/2017 Document Reviewed: 04/06/2015 Elsevier Interactive Patient Education  2019 [employer].

## 2018-08-12 ENCOUNTER — Other Ambulatory Visit: Payer: Self-pay

## 2018-08-12 ENCOUNTER — Ambulatory Visit: Payer: Self-pay

## 2018-08-12 DIAGNOSIS — I10 Essential (primary) hypertension: Secondary | ICD-10-CM

## 2018-08-12 MED ORDER — METOPROLOL SUCCINATE ER 25 MG PO TB24: 25.0000 mg | ORAL_TABLET | Freq: Every day | ORAL | 3 refills | Status: DC

## 2018-08-12 MED ORDER — METOPROLOL SUCCINATE 12.5 MG HALF TABLET: 12.5000 mg | ORAL_TABLET | Freq: Every day | ORAL | Status: DC

## 2018-08-12 NOTE — Telephone Encounter (Signed)
Pt states that her BP has been in the normal range, some days her readings are still higher. Pt states that she is not comfortable coming completely off of BP medication. She states that some days, she does wake up with "a pounding" feeling in her chest, but is not horribly uncomfortable. She wants to get this under control before going to GI for continuous acid reflux "throat issues."

## 2018-08-12 NOTE — Telephone Encounter (Signed)
How are BP levels reading? If doing really well with holding the medicine we may be able to stop this one altogether.

## 2018-08-12 NOTE — Telephone Encounter (Signed)
Verify that she has stopped losartan completely.  I would recommend switching the Metoprolol Tartrate (Lopressor) to Metoprolol Succinate (Toprol XL) 12.5 mg daily. This is more slowly released so should hopefully mitigate other side effects. The other option would be to switch to low dose (2.5) mg Amlodipine daily but this can cause ankle/foot swelling. Ok to send in 30-day supply of medicine she chooses. Follow-up with me in 2 weeks.

## 2018-08-12 NOTE — Telephone Encounter (Signed)
Pt has stopped taking Losartan. She stated that she felt more comfortable taking Metoprolol Succinate 12.5 MG. Stated she will call back to schedule her 2 week follow up.

## 2018-08-12 NOTE — Telephone Encounter (Signed)
Summary: Medication Change/Reaction   Pt would like to switch her BP meds. She stated they were causing considerable indigestion and heartburn. She mentioned that PCP discussed possibly trying a time release which she would be willing to try or another alternative. Please advise.         Call History    Type Contact Phone User  08/12/2018 12:03 PM Phone (Incoming) Cherry, [place] "Bev" (Self) [phone] (H) Tice, [place] D  Pt. Feels the Metoprolol is causing reflux. States she has tried holding the medication at night, and those nights she does not have any problem. States she has started eating dinner early as well. Would like to try the time released pill they have discussed. Please advise pt.   Answer Assessment - Initial Assessment Questions 1. SYMPTOMS: "Do you have any symptoms?"     Really bad reflux - to the point of vomiting at times. 2. SEVERITY: If symptoms are present, ask "Are they mild, moderate or severe?"     Severe  Protocols used: MEDICATION QUESTION CALL-A-AH

## 2018-08-15 ENCOUNTER — Other Ambulatory Visit: Payer: Self-pay | Admitting: Physician Assistant

## 2018-08-15 DIAGNOSIS — F341 Dysthymic disorder: Secondary | ICD-10-CM

## 2018-08-15 NOTE — Telephone Encounter (Signed)
Last rx for Xanax was 07/02/18 #15 1RF Last OV: 07/23/18 Medicare Wellness

## 2018-08-16 ENCOUNTER — Encounter: Payer: Self-pay | Admitting: Gastroenterology

## 2018-08-17 ENCOUNTER — Encounter: Payer: Self-pay | Admitting: Gastroenterology

## 2018-08-23 DIAGNOSIS — H2513 Age-related nuclear cataract, bilateral: Secondary | ICD-10-CM | POA: Diagnosis not present

## 2018-08-23 DIAGNOSIS — H04123 Dry eye syndrome of bilateral lacrimal glands: Secondary | ICD-10-CM | POA: Diagnosis not present

## 2018-08-28 DIAGNOSIS — M7502 Adhesive capsulitis of left shoulder: Secondary | ICD-10-CM | POA: Diagnosis not present

## 2018-08-29 ENCOUNTER — Telehealth: Payer: Self-pay | Admitting: Physician Assistant

## 2018-08-29 NOTE — Telephone Encounter (Signed)
Clearance form in your folder

## 2018-08-29 NOTE — Telephone Encounter (Signed)
Received a pre-operative clearance from [employer]. I have placed them in the bin up front.

## 2018-09-02 ENCOUNTER — Other Ambulatory Visit (HOSPITAL_COMMUNITY): Payer: Self-pay | Admitting: Cardiology

## 2018-09-03 ENCOUNTER — Other Ambulatory Visit: Payer: Self-pay | Admitting: Physician Assistant

## 2018-09-03 ENCOUNTER — Telehealth (HOSPITAL_COMMUNITY): Payer: Self-pay | Admitting: *Deleted

## 2018-09-03 ENCOUNTER — Other Ambulatory Visit (HOSPITAL_COMMUNITY): Payer: Self-pay | Admitting: Cardiology

## 2018-09-03 DIAGNOSIS — I10 Essential (primary) hypertension: Secondary | ICD-10-CM

## 2018-09-03 NOTE — Telephone Encounter (Signed)
Received fax from [facility], pt needs clearance for left shoulder manipulation under anesthesia.  Per Dr Aundra Dubin: "optimized for surgery from a cardiac standpoint onlyk, ok to stop Plavix 5 days prior"  Note faxed back to them at [phone]

## 2018-09-03 NOTE — Telephone Encounter (Signed)
I have printed out recent OV note and EKG from January to send. She needs updated lab for clearance. She never came back for CPE labs so she needs to have these done. Please check PT/INR level and urine culture as well.   Will sign once these are in as well.

## 2018-09-04 ENCOUNTER — Other Ambulatory Visit: Payer: Self-pay | Admitting: Physician Assistant

## 2018-09-04 ENCOUNTER — Other Ambulatory Visit (HOSPITAL_COMMUNITY): Payer: Self-pay

## 2018-09-04 DIAGNOSIS — Z01818 Encounter for other preprocedural examination: Secondary | ICD-10-CM

## 2018-09-04 DIAGNOSIS — Z5181 Encounter for therapeutic drug level monitoring: Secondary | ICD-10-CM

## 2018-09-04 DIAGNOSIS — Z7901 Long term (current) use of anticoagulants: Secondary | ICD-10-CM

## 2018-09-04 NOTE — Telephone Encounter (Signed)
LMOVM advising patient she is due for her CPE labs. Orders already placed. She can schedule at [facility] location or [facility] location.  Unable to send rest of paperwork to Ortho until completed.

## 2018-09-06 ENCOUNTER — Encounter (HOSPITAL_COMMUNITY): Payer: Medicare Other | Admitting: Cardiology

## 2018-09-28 ENCOUNTER — Encounter: Payer: Self-pay | Admitting: Family Medicine

## 2018-09-28 ENCOUNTER — Ambulatory Visit (INDEPENDENT_AMBULATORY_CARE_PROVIDER_SITE_OTHER): Payer: Medicare Other | Admitting: Family Medicine

## 2018-09-28 DIAGNOSIS — N3090 Cystitis, unspecified without hematuria: Secondary | ICD-10-CM

## 2018-09-28 MED ORDER — CIPROFLOXACIN HCL 500 MG PO TABS: 500.0000 mg | ORAL_TABLET | Freq: Two times a day (BID) | ORAL | 0 refills | Status: AC

## 2018-09-28 NOTE — Assessment & Plan Note (Addendum)
-  Treat with cipro 500mg  BID x7 days.  -Push fluids -Call for f/u if not improving or if symptoms are worsening

## 2018-09-28 NOTE — Progress Notes (Signed)
Emily [name] - 68 y.o. female MRN [id number]  Date of birth: [birth year]   This visit type was conducted due to national recommendations for restrictions regarding the COVID-19 Pandemic (e.g. social distancing).  This format is felt to be most appropriate for this patient at this time.  All issues noted in this document were discussed and addressed.  No physical exam was performed (except for noted visual exam findings with Video Visits).  I discussed the limitations of evaluation and management by telemedicine and the availability of in person appointments. The patient expressed understanding and agreed to proceed.  I connected with@ on 09/28/18 at 11:00 AM EDT by a video enabled telemedicine application and verified that I am speaking with the correct person using two identifiers.   Patient Location: Home [address] [place] [address]   Provider location:   [facility]  Chief Complaint  Patient presents with  . Urinary Tract Infection    Onset: 24hrs, burning/urgency/Hx UTI's, last one was 6 mo ago,     HPI  Emily [name] is a 68 y.o. female who presents via Engineer, civil (consulting) for a telehealth visit today.  She has complaint of dysuria, frequency and urgency.  She reports symptoms started 1 day ago and feel similar to previous UTI.  She denies back/flank pain, nausea, fever, chills, vaginal discharge.  She has not tried anything for treatment.  In the past she has tried macrobid, bactrim and keflex which were not successful in clearing up her UTI.  Cipro has worked well for her in the past.    ROS:  A comprehensive ROS was completed and negative except as noted per HPI  Past Medical History:  Diagnosis Date  . Anxiety    no anxiety  . CAD (coronary artery disease)   . Cataract   . Chronic insomnia   . Colon polyp   . Depression   . GERD (gastroesophageal reflux disease)   . GERD with esophagitis   . Heart attack ([facility]) 1-09  . Heart murmur   .  Hyperlipemia   . Hypertension   . Perirectal abscess   . Shingles 08/31/2017   2nd dx with shingles, per pt    Past Surgical History:  Procedure Laterality Date  . CESAREAN SECTION     x 2  . COLONOSCOPY    . COLONOSCOPY W/ POLYPECTOMY  2007  . INCISION AND DRAINAGE ABSCESS ANAL    . POLYPECTOMY    . PTCA with stent to third obtuse marginal    . STOMACH SURGERY      Family History  Problem Relation Age of Onset  . Skin cancer Mother   . Allergy (severe) Father   . Heart disease Father   . Leukemia Father        CML  . Renal Disease Paternal Grandfather        one removed   . Colon cancer Neg Hx   . Colon polyps Neg Hx   . Rectal cancer Neg Hx   . Stomach cancer Neg Hx     Social History   Socioeconomic History  . Marital status: Married    Spouse name: Not on file  . Number of children: 2  . Years of education: Not on file  . Highest education level: Not on file  Occupational History  . Occupation: Risk manager  Social Needs  . Financial resource strain: Not on file  . Food insecurity:    Worry: Not on file    Inability:  Not on file  . Transportation needs:    Medical: Not on file    Non-medical: Not on file  Tobacco Use  . Smoking status: Former Smoker    Packs/day: 2.00    Years: 20.00    Pack years: 40.00    Types: Cigarettes    Last attempt to quit: 06/19/2006    Years since quitting: 12.2  . Smokeless tobacco: Never Used  Substance and Sexual Activity  . Alcohol use: Not Currently    Alcohol/week: 0.0 standard drinks    Comment: <2 monthly; rare  . Drug use: No  . Sexual activity: Not on file  Lifestyle  . Physical activity:    Days per week: Not on file    Minutes per session: Not on file  . Stress: Not on file  Relationships  . Social connections:    Talks on phone: Not on file    Gets together: Not on file    Attends religious service: Not on file    Active member of club or organization: Not on file    Attends meetings of  clubs or organizations: Not on file    Relationship status: Not on file  . Intimate partner violence:    Fear of current or ex partner: Not on file    Emotionally abused: Not on file    Physically abused: Not on file    Forced sexual activity: Not on file  Other Topics Concern  . Not on file  Social History Narrative   Resides in [place] with her husband.    Husband works in [place] and comes home two to three times a year   2 children who go to [place]   Just got accepted to [employer]   Exercise- NO     Current Outpatient Medications:  .  acetaminophen (TYLENOL) 650 MG CR tablet, Take 650 mg by mouth every 8 (eight) hours as needed for pain (Rarely uses this medication). , Disp: , Rfl:  .  Acetylcarnitine HCl (ACETYL L-CARNITINE) 250 MG CAPS, Take by mouth. 200 mg every other day, Disp: , Rfl:  .  albuterol (PROVENTIL HFA;VENTOLIN HFA) 108 (90 Base) MCG/ACT inhaler, Inhale 2 puffs into the lungs every 6 (six) hours as needed for wheezing or shortness of breath., Disp: 1 Inhaler, Rfl: 0 .  ALPRAZolam (XANAX) 0.25 MG tablet, TAKE 1 TO 1 & 1/2 TABLET BY MOUTH AT BEDTIME AS NEEDED, Disp: 15 tablet, Rfl: 1 .  atorvastatin (LIPITOR) 80 MG tablet, TAKE 1 TABLET BY MOUTH EVERY DAY, Disp: 90 tablet, Rfl: 0 .  b complex vitamins tablet, Take 1 tablet by mouth daily.  , Disp: , Rfl:  .  Biotin 300 MCG TABS, Take 600 mcg by mouth every other day.  , Disp: , Rfl:  .  Calcium-Magnesium-Zinc 500-250-12.5 MG TABS, Take 1 tablet by mouth every other day.  , Disp: , Rfl:  .  Chromium Picolinate 200 MCG TABS, Take 1 tablet by mouth every other day.  , Disp: , Rfl:  .  ciprofloxacin (CIPRO) 500 MG tablet, Take 1 tablet (500 mg total) by mouth 2 (two) times daily for 7 days., Disp: 14 tablet, Rfl: 0 .  clobetasol cream (TEMOVATE) 0.05 %, APPLY A THIN LAYER TOPICALLY TO AFFECTED AREA(S) DAILY, Disp: , Rfl: 0 .  clopidogrel (PLAVIX) 75 MG tablet, TAKE 1 TABLET BY MOUTH EVERY DAY, Disp: 90 tablet, Rfl: 0 .   Coenzyme Q10 (COQ10) 200 MG CAPS, Take 1 capsule by mouth daily.  ,  Disp: , Rfl:  .  CRANBERRY JUICE EXTRACT PO, Take 500 mg by mouth daily.  , Disp: , Rfl:  .  EPINEPHrine 0.3 mg/0.3 mL IJ SOAJ injection, INJECT 0.3 MLS INTO THE MUSCLE ONCE FOR 1 DOSE, Disp: , Rfl: 0 .  famotidine (PEPCID AC) 10 MG tablet, Take 1 tablet (10 mg total) by mouth 2 (two) times daily. Take  1-2 tablets twice daily, Disp: 180 tablet, Rfl: 3 .  finasteride (PROSCAR) 5 MG tablet, Take 2.5 mg by mouth daily., Disp: , Rfl:  .  Flaxseed, Linseed, (FLAX SEED OIL) 1000 MG CAPS, Take 1,200 mg by mouth daily.  , Disp: , Rfl:  .  Garlic Oil 097 MG TABS, Take 1 tablet by mouth daily.  , Disp: , Rfl:  .  losartan (COZAAR) 25 MG tablet, TAKE 1 TABLET BY MOUTH EVERY DAY, Disp: 30 tablet, Rfl: 0 .  meloxicam (MOBIC) 15 MG tablet, Take 1 tablet (15 mg total) by mouth daily., Disp: 10 tablet, Rfl: 0 .  metoprolol tartrate (LOPRESSOR) 25 MG tablet, TAKE 1/2 TABLET BY MOUTH 2 TIMES DAILY, Disp: 90 tablet, Rfl: 3 .  nitroGLYCERIN (NITROSTAT) 0.4 MG SL tablet, Place 1 tablet (0.4 mg total) under the tongue every 5 (five) minutes x 3 doses as needed for chest pain., Disp: 25 tablet, Rfl: 3 .  omega-3 acid ethyl esters (LOVAZA) 1 g capsule, Take 1 capsule (1 g total) by mouth daily. NEED APPT WITH DR [facility] FOR FUTURE REFILLS [phone], Disp: 90 capsule, Rfl: 0 .  Probiotic Product (ALIGN PO), Take 1 capsule by mouth daily., Disp: , Rfl:  .  Rhodiola 300 MG CAPS, Take 1 capsule by mouth daily.  , Disp: , Rfl:  .  triamcinolone (KENALOG) 0.025 % ointment, After completion of 2 weeks of clobetasol. APPLY A THIN LAYER topically TO THE AFFECTED AREA(S) 2 TIMES PER DAY IF NEEDED, Disp: , Rfl: 3 .  valACYclovir (VALTREX) 1000 MG tablet, Take 1 tablet (1,000 mg total) by mouth 3 (three) times daily., Disp: 21 tablet, Rfl: 0  Current Facility-Administered Medications:  .  metoprolol succinate (TOPROL-XL) 24 hr tablet 12.5 mg, 12.5 mg, Oral, Daily,  Brunetta Jeans, PA-C  EXAM:  VITALS per patient if applicable: BP 834/19 (BP Location: Right Arm, Patient Position: Sitting, Cuff Size: Normal) Comment: pt obtained at hm  Pulse 73   Temp 97.6 F (36.4 C) (Tympanic) Comment: obtain @ hm  Wt 150 lb (68 kg) Comment: obtsain @ hm , pt reports  BMI 27.44 kg/m   GENERAL: alert, oriented, appears well and in no acute distress  HEENT: atraumatic, conjunttiva clear, no obvious abnormalities on inspection of external nose and ears  NECK: normal movements of the head and neck  LUNGS: on inspection no signs of respiratory distress, breathing rate appears normal, no obvious gross SOB  CV: no obvious cyanosis  MS: moves all visible extremities without noticeable abnormality  PSYCH/NEURO: pleasant and cooperative, no obvious depression or anxiety, speech and thought processing grossly intact  ASSESSMENT AND PLAN:  Discussed the following assessment and plan:  Cystitis -Treat with cipro 500mg  BID x7 days.  -Push fluids -Call for f/u if not improving or if symptoms are worsening         I discussed the assessment and treatment plan with the patient. The patient was provided an opportunity to ask questions and all were answered. The patient agreed with the plan and demonstrated an understanding of the instructions.   The patient was  advised to call back or seek an in-person evaluation if the symptoms worsen or if the condition fails to improve as anticipated.     Luetta Nutting, DO

## 2018-10-24 ENCOUNTER — Other Ambulatory Visit: Payer: Self-pay

## 2018-10-24 ENCOUNTER — Ambulatory Visit (HOSPITAL_COMMUNITY)
Admission: RE | Admit: 2018-10-24 | Discharge: 2018-10-24 | Disposition: A | Discharge status: Home / Self Care | Payer: Medicare Other | Source: Ambulatory Visit | Attending: Cardiology | Admitting: Cardiology

## 2018-10-24 DIAGNOSIS — I2510 Atherosclerotic heart disease of native coronary artery without angina pectoris: Secondary | ICD-10-CM

## 2018-10-24 DIAGNOSIS — E782 Mixed hyperlipidemia: Secondary | ICD-10-CM

## 2018-10-24 DIAGNOSIS — I10 Essential (primary) hypertension: Secondary | ICD-10-CM

## 2018-10-24 NOTE — Progress Notes (Signed)
Spoke to pt. Pt prefers labs to be drawn at PCP. Faxed orders to pcp. No further questions at this time.

## 2018-10-24 NOTE — Patient Instructions (Signed)
Please follow up with Dr. Aundra Dubin in 1 year.

## 2018-10-25 NOTE — Progress Notes (Signed)
Heart Failure TeleHealth Note  Due to national recommendations of social distancing due to [place] 19, Audio/video telehealth visit is felt to be most appropriate for this patient at this time.  See MyChart message from today for patient consent regarding telehealth for [facility].  Date:  10/25/2018   ID:  Emily [name], DOB February 26, 1951, MRN [id number]  Location: Home  Provider location: [facility] Type of Visit: Established patient   PCP:  Brunetta Jeans, PA-C  Cardiologist:  Dr. Aundra Dubin  Chief Complaint: Shoulder pain   History of Present Illness: Emily [name] is a 68 y.o. female who presents via audio/video conferencing for a telehealth visit today.     she denies symptoms worrisome for COVID 19.   Patient has a history of CAD s/p inferior MI in 1/09 treated with PCI to OM3.  She has had no cardiac events since that time.  Echo in 9/16 showed EF 55-60% with mild aortic insufficiency.    Symptomatically, she has been doing well.  No exertional dyspnea or chest pain.  BP was up earlier this year after getting a cortisone shot, but is back down now.  She is on Toprol XL 25 mg daily, SBP now in 120s.  Main complaint is left shoulder pain from rotator cuff injury.  She was unable to tolerate Crestor and is back on atorvastatin 80 daily.    Labs (12/14): LDL 90, HDL 41 Labs (6/15): LDL 106, HDL 48 Labs (7/16): LDL 102 Labs (2/17): K 4.4, creatinine 0.65, LDL 123, HDL 49, TGs 266 Labs (8/17): LDL 85, TGs 165, HDL 52 Labs (9/18): K 3.4, creatinine 0.66 Labs (1/19): LDL 101, HDL 50 Labs (8/19): LDL 104 Labs (1/20): K 3.7, creatinine 0.65  PMH: 1. CAD: Inferior MI in 1/09 with culprit vessel a large OM3.  This was treated with PCI.  Had residual 50-70% mLAD stenosis.  Echo (3/14) with EF 60-65%, mild-moderate AI - Echo (9/16): EF 55-60%, inferoseptal akinesis, mild AI.  2. HTN 3. H/o fen-phen use 4. Aortic insufficiency: Mild to moderate on  echo in 3/14. Mild on 9/16 echo.  5. Carotid dopplers (9/12) with mild plaque.  6. Hyperlipidemia  Current Outpatient Medications  Medication Sig Dispense Refill  . acetaminophen (TYLENOL) 650 MG CR tablet Take 650 mg by mouth every 8 (eight) hours as needed for pain (Rarely uses this medication).     . Acetylcarnitine HCl (ACETYL L-CARNITINE) 250 MG CAPS Take by mouth. 200 mg every other day    . albuterol (PROVENTIL HFA;VENTOLIN HFA) 108 (90 Base) MCG/ACT inhaler Inhale 2 puffs into the lungs every 6 (six) hours as needed for wheezing or shortness of breath. 1 Inhaler 0  . ALPRAZolam (XANAX) 0.25 MG tablet TAKE 1 TO 1 & 1/2 TABLET BY MOUTH AT BEDTIME AS NEEDED 15 tablet 1  . atorvastatin (LIPITOR) 80 MG tablet TAKE 1 TABLET BY MOUTH EVERY DAY 90 tablet 0  . b complex vitamins tablet Take 1 tablet by mouth daily.      . Biotin 300 MCG TABS Take 600 mcg by mouth every other day.      . Calcium-Magnesium-Zinc 500-250-12.5 MG TABS Take 1 tablet by mouth every other day.      . Chromium Picolinate 200 MCG TABS Take 1 tablet by mouth every other day.      . clobetasol cream (TEMOVATE) 0.05 % APPLY A THIN LAYER TOPICALLY TO AFFECTED AREA(S) DAILY  0  . clopidogrel (PLAVIX) 75 MG  tablet TAKE 1 TABLET BY MOUTH EVERY DAY 90 tablet 0  . Coenzyme Q10 (COQ10) 200 MG CAPS Take 1 capsule by mouth daily.      Marland Kitchen CRANBERRY JUICE EXTRACT PO Take 500 mg by mouth daily.      Marland Kitchen EPINEPHrine 0.3 mg/0.3 mL IJ SOAJ injection INJECT 0.3 MLS INTO THE MUSCLE ONCE FOR 1 DOSE  0  . famotidine (PEPCID AC) 10 MG tablet Take 1 tablet (10 mg total) by mouth 2 (two) times daily. Take  1-2 tablets twice daily 180 tablet 3  . finasteride (PROSCAR) 5 MG tablet Take 2.5 mg by mouth daily.    . Flaxseed, Linseed, (FLAX SEED OIL) 1000 MG CAPS Take 1,200 mg by mouth daily.      . Garlic Oil 443 MG TABS Take 1 tablet by mouth daily.      Marland Kitchen losartan (COZAAR) 25 MG tablet TAKE 1 TABLET BY MOUTH EVERY DAY 30 tablet 0  . meloxicam  (MOBIC) 15 MG tablet Take 1 tablet (15 mg total) by mouth daily. 10 tablet 0  . metoprolol tartrate (LOPRESSOR) 25 MG tablet TAKE 1/2 TABLET BY MOUTH 2 TIMES DAILY 90 tablet 3  . nitroGLYCERIN (NITROSTAT) 0.4 MG SL tablet Place 1 tablet (0.4 mg total) under the tongue every 5 (five) minutes x 3 doses as needed for chest pain. 25 tablet 3  . omega-3 acid ethyl esters (LOVAZA) 1 g capsule Take 1 capsule (1 g total) by mouth daily. NEED APPT WITH DR [facility] FOR FUTURE REFILLS [phone] 90 capsule 0  . Probiotic Product (ALIGN PO) Take 1 capsule by mouth daily.    . Rhodiola 300 MG CAPS Take 1 capsule by mouth daily.      Marland Kitchen triamcinolone (KENALOG) 0.025 % ointment After completion of 2 weeks of clobetasol. APPLY A THIN LAYER topically TO THE AFFECTED AREA(S) 2 TIMES PER DAY IF NEEDED  3  . valACYclovir (VALTREX) 1000 MG tablet Take 1 tablet (1,000 mg total) by mouth 3 (three) times daily. 21 tablet 0   Current Facility-Administered Medications  Medication Dose Route Frequency Provider Last Rate Last Dose  . metoprolol succinate (TOPROL-XL) 24 hr tablet 12.5 mg  12.5 mg Oral Daily Brunetta Jeans, PA-C        Allergies:   Benzonatate; Chlorpromazine hcl; Codeine; Doxycycline; Hydrocodone; Hydrocodone-acetaminophen; Lovaza [omega-3-acid ethyl esters]; Penicillins; Prochlorperazine edisylate; Promethazine hcl; and Sulfonamide derivatives   Social History:  The patient  reports that she quit smoking about 12 years ago. Her smoking use included cigarettes. She has a 40.00 pack-year smoking history. She has never used smokeless tobacco. She reports previous alcohol use. She reports that she does not use drugs.   Family History:  The patient's family history includes Allergy (severe) in her father; Heart disease in her father; Leukemia in her father; Renal Disease in her paternal grandfather; Skin cancer in her mother.   ROS:  Please see the history of present illness.   All other systems are personally  reviewed and negative.   Exam:  (Video/Tele Health Call; Exam is subjective and or/visual.) BP 120/80, wt 150 lbs General:  Speaks in full sentences. No resp difficulty. Lungs: Normal respiratory effort with conversation.  Abdomen: Non-distended per patient report Extremities: Pt denies edema. Neuro: Alert & oriented x 3.   Recent Labs: 01/17/2018: ALT 19; TSH 2.29 06/26/2018: BUN 19; Creatinine, Ser 0.65; Hemoglobin 15.2; Platelets 356; Potassium 3.7; Sodium 139  Personally reviewed   Wt Readings from Last 3 Encounters:  09/28/18 68 kg (150 lb)  07/23/18 68.9 kg (152 lb)  07/05/18 68 kg (150 lb)      ASSESSMENT AND PLAN:  1. CAD: Stable with no ischemic symptoms.  She had PCI in 1/09 in the setting of inferior MI.  She had residual untreated 50-70% mLAD stenosis.   - Continue Toprol XL and statin. - She has been on Plavix long-term with no evidence for GI bleeding.   2. Hyperlipidemia: Goal LDL < 70.  LDL has been > goal on atorvastatin 80 mg daily. She cannot tolerate Crestor 40 mg daily.  She has not wanted to take Zetia.  We discussed Repatha.   - I will arrange to check lipids.  If LDL > goal, will refer her to lipid clinic for [place].  She is reluctant but agrees to aggressive treatment if LDL remains above goal.  3. Aortic insufficiency: Most recent echo showed only mild aortic insufficiency (had been moderate in the past).  Given lack of symptoms, will not repeat echo at this time.   COVID screen The patient does not have any symptoms that suggest any further testing/ screening at this time.  Social distancing reinforced today.  Patient Risk: After full review of this patients clinical status, I feel that they are at moderate risk for cardiac decompensation at this time.  Relevant cardiac medications were reviewed at length with the patient today. The patient does not have concerns regarding their medications at this time.   Recommended follow-up:  1 year  Today, I have  spent 16 minutes with the patient with telehealth technology discussing the above issues .    Signed, [name], MD  10/25/2018  [place] [address] [facility] and [place] [address] [phone] (office) [phone] (fax)

## 2018-11-26 ENCOUNTER — Other Ambulatory Visit (HOSPITAL_COMMUNITY): Payer: Self-pay | Admitting: Cardiology

## 2019-01-07 DIAGNOSIS — Z79899 Other long term (current) drug therapy: Secondary | ICD-10-CM | POA: Diagnosis not present

## 2019-01-07 DIAGNOSIS — L658 Other specified nonscarring hair loss: Secondary | ICD-10-CM | POA: Diagnosis not present

## 2019-02-25 ENCOUNTER — Other Ambulatory Visit (HOSPITAL_COMMUNITY): Payer: Self-pay | Admitting: Cardiology

## 2019-03-21 ENCOUNTER — Encounter: Payer: Self-pay | Admitting: Physician Assistant

## 2019-03-21 ENCOUNTER — Ambulatory Visit (INDEPENDENT_AMBULATORY_CARE_PROVIDER_SITE_OTHER): Payer: Medicare Other | Admitting: Physician Assistant

## 2019-03-21 ENCOUNTER — Other Ambulatory Visit: Payer: Self-pay

## 2019-03-21 VITALS — BP 121/62 | Wt 149.0 lb

## 2019-03-21 DIAGNOSIS — I2510 Atherosclerotic heart disease of native coronary artery without angina pectoris: Secondary | ICD-10-CM | POA: Diagnosis not present

## 2019-03-21 DIAGNOSIS — R300 Dysuria: Secondary | ICD-10-CM | POA: Diagnosis not present

## 2019-03-21 MED ORDER — CIPROFLOXACIN HCL 500 MG PO TABS: 500.0000 mg | ORAL_TABLET | Freq: Two times a day (BID) | ORAL | 0 refills | Status: DC

## 2019-03-21 MED ORDER — PHENAZOPYRIDINE HCL 100 MG PO TABS: 100.0000 mg | ORAL_TABLET | Freq: Three times a day (TID) | ORAL | 0 refills | Status: DC | PRN

## 2019-03-21 NOTE — Progress Notes (Signed)
Virtual Visit via Video   I connected with patient on 03/21/19 at  2:00 PM EDT by a video enabled telemedicine application and verified that I am speaking with the correct person using two identifiers.  Location patient: Home Location provider: [facility], Office Persons participating in the virtual visit: Patient, Provider, [place] (Patina Moore)  I discussed the limitations of evaluation and management by telemedicine and the availability of in person appointments. The patient expressed understanding and agreed to proceed.  Subjective:   HPI:   Patient presents via Doxy.Me today c/o 2 days of dysuria, urinary urgency, frequency and hesitancy. Notes suprapubic pressure. Denies back pain, flank pain, fever, nausea or vomiting. Denies vaginal symptoms.  ROS:   See pertinent positives and negatives per HPI.  Patient Active Problem List   Diagnosis Date Noted  . Cystitis 09/28/2018  . Visit for preventive health examination 08/11/2015  . Aortic regurgitation 05/29/2013  . Systolic ejection murmur 99991111  . Carotid stenosis 01/26/2011  . BACK PAIN, LEFT 01/19/2009  . ALOPECIA 01/28/2008  . Obesity 10/21/2007  . Hyperlipidemia 07/24/2007  . Coronary atherosclerosis 07/24/2007  . GERD 07/24/2007  . POLYP, COLON 03/26/2007  . ANXIETY DEPRESSION 03/26/2007  . INSOMNIA, CHRONIC 03/26/2007  . HYPERTENSION, MILD 03/26/2007    Social History   Tobacco Use  . Smoking status: Former Smoker    Packs/day: 2.00    Years: 20.00    Pack years: 40.00    Types: Cigarettes    Quit date: 06/19/2006    Years since quitting: 12.7  . Smokeless tobacco: Never Used  Substance Use Topics  . Alcohol use: Not Currently    Alcohol/week: 0.0 standard drinks    Comment: <2 monthly; rare    Current Outpatient Medications:  .  acetaminophen (TYLENOL) 650 MG CR tablet, Take 650 mg by mouth every 8 (eight) hours as needed for pain (Rarely uses this medication). , Disp: , Rfl:  .   Acetylcarnitine HCl (ACETYL L-CARNITINE) 250 MG CAPS, Take by mouth. 200 mg every other day, Disp: , Rfl:  .  albuterol (PROVENTIL HFA;VENTOLIN HFA) 108 (90 Base) MCG/ACT inhaler, Inhale 2 puffs into the lungs every 6 (six) hours as needed for wheezing or shortness of breath., Disp: 1 Inhaler, Rfl: 0 .  ALPRAZolam (XANAX) 0.25 MG tablet, TAKE 1 TO 1 & 1/2 TABLET BY MOUTH AT BEDTIME AS NEEDED, Disp: 15 tablet, Rfl: 1 .  atorvastatin (LIPITOR) 80 MG tablet, TAKE 1 TABLET BY MOUTH EVERY DAY, Disp: 90 tablet, Rfl: 0 .  b complex vitamins tablet, Take 1 tablet by mouth daily.  , Disp: , Rfl:  .  Biotin 300 MCG TABS, Take 600 mcg by mouth every other day.  , Disp: , Rfl:  .  Calcium-Magnesium-Zinc 500-250-12.5 MG TABS, Take 1 tablet by mouth every other day.  , Disp: , Rfl:  .  Chromium Picolinate 200 MCG TABS, Take 1 tablet by mouth every other day.  , Disp: , Rfl:  .  clobetasol cream (TEMOVATE) 0.05 %, APPLY A THIN LAYER TOPICALLY TO AFFECTED AREA(S) DAILY, Disp: , Rfl: 0 .  clopidogrel (PLAVIX) 75 MG tablet, TAKE 1 TABLET BY MOUTH EVERY DAY, Disp: 90 tablet, Rfl: 3 .  Coenzyme Q10 (COQ10) 200 MG CAPS, Take 1 capsule by mouth daily.  , Disp: , Rfl:  .  CRANBERRY JUICE EXTRACT PO, Take 500 mg by mouth daily.  , Disp: , Rfl:  .  EPINEPHrine 0.3 mg/0.3 mL IJ SOAJ injection, INJECT 0.3 MLS  INTO THE MUSCLE ONCE FOR 1 DOSE, Disp: , Rfl: 0 .  famotidine (PEPCID AC) 10 MG tablet, Take 1 tablet (10 mg total) by mouth 2 (two) times daily. Take  1-2 tablets twice daily, Disp: 180 tablet, Rfl: 3 .  finasteride (PROSCAR) 5 MG tablet, Take 2.5 mg by mouth daily., Disp: , Rfl:  .  Flaxseed, Linseed, (FLAX SEED OIL) 1000 MG CAPS, Take 1,200 mg by mouth daily.  , Disp: , Rfl:  .  Garlic Oil XX123456 MG TABS, Take 1 tablet by mouth daily.  , Disp: , Rfl:  .  losartan (COZAAR) 25 MG tablet, TAKE 1 TABLET BY MOUTH EVERY DAY, Disp: 30 tablet, Rfl: 0 .  meloxicam (MOBIC) 15 MG tablet, Take 1 tablet (15 mg total) by mouth  daily., Disp: 10 tablet, Rfl: 0 .  metoprolol tartrate (LOPRESSOR) 25 MG tablet, TAKE 1/2 TABLET BY MOUTH 2 TIMES DAILY, Disp: 90 tablet, Rfl: 3 .  nitroGLYCERIN (NITROSTAT) 0.4 MG SL tablet, Place 1 tablet (0.4 mg total) under the tongue every 5 (five) minutes x 3 doses as needed for chest pain., Disp: 25 tablet, Rfl: 3 .  omega-3 acid ethyl esters (LOVAZA) 1 g capsule, Take 1 capsule (1 g total) by mouth daily., Disp: 90 capsule, Rfl: 3 .  Probiotic Product (ALIGN PO), Take 1 capsule by mouth daily., Disp: , Rfl:  .  Rhodiola 300 MG CAPS, Take 1 capsule by mouth daily.  , Disp: , Rfl:  .  triamcinolone (KENALOG) 0.025 % ointment, After completion of 2 weeks of clobetasol. APPLY A THIN LAYER topically TO THE AFFECTED AREA(S) 2 TIMES PER DAY IF NEEDED, Disp: , Rfl: 3 .  valACYclovir (VALTREX) 1000 MG tablet, Take 1 tablet (1,000 mg total) by mouth 3 (three) times daily., Disp: 21 tablet, Rfl: 0  Current Facility-Administered Medications:  .  metoprolol succinate (TOPROL-XL) 24 hr tablet 12.5 mg, 12.5 mg, Oral, Daily, Brunetta Jeans, PA-C  Allergies  Allergen Reactions  . Benzonatate     Unknown, patient states "it made me sick"  . Chlorpromazine Hcl     unknown  . Codeine     REACTION: causes vomiting  . Doxycycline     "explosive diarrhea" -does not wish to take this anymore  . Hydrocodone     REACTION: causes vomiing and nausea  . Hydrocodone-Acetaminophen Nausea And Vomiting  . Lovaza [Omega-3-Acid Ethyl Esters] Other (See Comments)    Loose stools with two capsules  . Penicillins     REACTION: causes rash  . Prochlorperazine Edisylate     unknown   . Promethazine Hcl     unknown  . Sulfonamide Derivatives     unknown     Objective:   BP 121/62   Wt 149 lb (67.6 kg)   BMI 27.25 kg/m   Patient is well-developed, well-nourished in no acute distress.  Resting comfortably at home.  Head is normocephalic, atraumatic.  No labored breathing.  Speech is clear and  coherent with logical content.  Patient is alert and oriented at baseline.   Assessment and Plan:   1. Dysuria Concern for UTI. Start Cipro 500 mg BID x 5 days. Increase fluids. Start pyridium. Strict follow-up discussed.     Leeanne Rio, PA-C 03/21/2019

## 2019-03-21 NOTE — Patient Instructions (Signed)
Your symptoms are consistent with a bladder infection, also called acute cystitis. Please take your antibiotic (Cipro) as directed until all pills are gone.  Stay very well hydrated.  Consider a daily probiotic (Align, Culturelle, or Activia) to help prevent stomach upset caused by the antibiotic.  Taking a probiotic daily may also help prevent recurrent UTIs.  Also consider taking AZO (Phenazopyridine) tablets to help decrease pain with urination.  I will call you with your urine testing results.  We will change antibiotics if indicated.  Call or return to clinic if symptoms are not resolved by completion of antibiotic.   Urinary Tract Infection A urinary tract infection (UTI) can occur any place along the urinary tract. The tract includes the kidneys, ureters, bladder, and urethra. A type of germ called bacteria often causes a UTI. UTIs are often helped with antibiotic medicine.  HOME CARE   If given, take antibiotics as told by your doctor. Finish them even if you start to feel better.  Drink enough fluids to keep your pee (urine) clear or pale yellow.  Avoid tea, drinks with caffeine, and bubbly (carbonated) drinks.  Pee often. Avoid holding your pee in for a long time.  Pee before and after having sex (intercourse).  Wipe from front to back after you poop (bowel movement) if you are a woman. Use each tissue only once. GET HELP RIGHT AWAY IF:   You have back pain.  You have lower belly (abdominal) pain.  You have chills.  You feel sick to your stomach (nauseous).  You throw up (vomit).  Your burning or discomfort with peeing does not go away.  You have a fever.  Your symptoms are not better in 3 days. MAKE SURE YOU:   Understand these instructions.  Will watch your condition.  Will get help right away if you are not doing well or get worse. Document Released: 11/22/2007 Document Revised: 02/28/2012 Document Reviewed: 01/04/2012 Exit[employer] Patient Information 2015  [employer]. This information is not intended to replace advice given to you by your health care provider. Make sure you discuss any questions you have with your health care provider.    

## 2019-03-21 NOTE — Progress Notes (Signed)
I have discussed the procedure for the virtual visit with the patient who has given consent to proceed with assessment and treatment.   Jerilynn Feldmeier S Avnoor Koury, CMA     

## 2019-03-28 ENCOUNTER — Other Ambulatory Visit: Payer: Self-pay | Admitting: Physician Assistant

## 2019-03-28 ENCOUNTER — Telehealth: Payer: Self-pay | Admitting: Physician Assistant

## 2019-03-28 DIAGNOSIS — R300 Dysuria: Secondary | ICD-10-CM

## 2019-03-28 DIAGNOSIS — N3090 Cystitis, unspecified without hematuria: Secondary | ICD-10-CM

## 2019-03-28 NOTE — Addendum Note (Signed)
Addended by: Leonidas Romberg on: 03/28/2019 03:13 PM   Modules accepted: Orders

## 2019-03-28 NOTE — Telephone Encounter (Signed)
Please advise 

## 2019-03-28 NOTE — Telephone Encounter (Signed)
She can have 2 days to make a 7 day course as previously noted. She will need to come in to give a urine sample or husband can come pick up a sterile container but the sample needs to be brought in within 1 hour of urination or be kept in the fridge to ensure adequate specimen.

## 2019-03-28 NOTE — Telephone Encounter (Signed)
Spoke with patient and advised that PCP will send in 2 more days of antibiotic. If symptoms persist after Sunday she will need to bring in a urine sample. She is agreeable. Rx sent to the pharmacy for 2 days.

## 2019-03-28 NOTE — Telephone Encounter (Signed)
Patient called saying that she started taking Cipro for her UTI and it has been working. But yesterday she stated to feel like the symptoms of her UTI was coming back. She is wondering if she would need two more days on this medication so her UTI wouldn't come back. She says that she feels fine today. Please advise. Pharmacy is [facility] [phone]

## 2019-03-28 NOTE — Telephone Encounter (Signed)
She would like 3 days of the medication. She can have her husband bring in a urine sample if [place] needs her to

## 2019-03-28 NOTE — Telephone Encounter (Signed)
Ok to give 2 additional days to make 7 day course total. If there are any residual symptoms, she will need to come in to office to give urine for UA and culture

## 2019-04-27 DIAGNOSIS — N390 Urinary tract infection, site not specified: Secondary | ICD-10-CM | POA: Diagnosis not present

## 2019-05-26 ENCOUNTER — Other Ambulatory Visit: Payer: Self-pay | Admitting: Physician Assistant

## 2019-05-26 ENCOUNTER — Other Ambulatory Visit (HOSPITAL_COMMUNITY): Payer: Self-pay | Admitting: Cardiology

## 2019-05-27 ENCOUNTER — Other Ambulatory Visit: Payer: Self-pay

## 2019-05-27 ENCOUNTER — Ambulatory Visit (INDEPENDENT_AMBULATORY_CARE_PROVIDER_SITE_OTHER): Payer: Medicare Other | Admitting: Physician Assistant

## 2019-05-27 ENCOUNTER — Encounter: Payer: Self-pay | Admitting: Physician Assistant

## 2019-05-27 VITALS — BP 134/72 | Wt 148.5 lb

## 2019-05-27 DIAGNOSIS — M7702 Medial epicondylitis, left elbow: Secondary | ICD-10-CM | POA: Diagnosis not present

## 2019-05-27 DIAGNOSIS — I2510 Atherosclerotic heart disease of native coronary artery without angina pectoris: Secondary | ICD-10-CM | POA: Diagnosis not present

## 2019-05-27 DIAGNOSIS — R002 Palpitations: Secondary | ICD-10-CM

## 2019-05-27 NOTE — Progress Notes (Signed)
Virtual Visit via Video   I connected with patient on 05/27/19 at  3:30 PM EST by a video enabled telemedicine application and verified that I am speaking with the correct person using two identifiers.  Location patient: Home Location provider: [facility] Office Persons participating in the virtual visit: Patient, Provider, Omar (Patina Moore)  I discussed the limitations of evaluation and management by telemedicine and the availability of in person appointments. The patient expressed understanding and agreed to proceed.  Subjective:   HPI:   Patient presents via Doxy[employer] today with 2 separate complaints.  First patient noticed last Friday evening having to very short-lived episodes of a "fluttering sensation".  Noted this in her upper abdomen and chest.  States each episode was short-lived.  Denies recurrence of the symptoms since that time.  Denied any chest pain, shortness of breath, lightheadedness, dizziness or sweating.  His episodes happened while she was sitting down on the couch.  Has not noticed any decrease exercise tolerance or windedness with exertion.  Has been trying to keep a well-balanced diet.  Denies any alcohol use or excessive caffeine intake.  Denies any change in chronic medications, or starting any new OTC medications or supplements.  Patient endorses Saturday noting pain in her L medial elbow, described as a throbbing pain.  Noted some radiation, like an electric shock, down into her hand with some tingling in her fourth and fifth phalanges.  Notes the radiation and tingling were very short-lived.  Denies decreased range of motion or strength in the upper extremity.  Denies any swelling, bruising or other skin changes.  Does note that she plays games a lot, especially on her phone and tablet, and elbow remains bent. Notes this discomfort started after playing games for a few hours.  Denies any recurrence of pain.  Completely asymptomatic at present.  ROS:    See pertinent positives and negatives per HPI.  Patient Active Problem List   Diagnosis Date Noted  . Cystitis 09/28/2018  . Visit for preventive health examination 08/11/2015  . Aortic regurgitation 05/29/2013  . Systolic ejection murmur 99991111  . Carotid stenosis 01/26/2011  . BACK PAIN, LEFT 01/19/2009  . ALOPECIA 01/28/2008  . Obesity 10/21/2007  . Hyperlipidemia 07/24/2007  . Coronary atherosclerosis 07/24/2007  . GERD 07/24/2007  . POLYP, COLON 03/26/2007  . ANXIETY DEPRESSION 03/26/2007  . INSOMNIA, CHRONIC 03/26/2007  . HYPERTENSION, MILD 03/26/2007    Social History   Tobacco Use  . Smoking status: Former Smoker    Packs/day: 2.00    Years: 20.00    Pack years: 40.00    Types: Cigarettes    Quit date: 06/19/2006    Years since quitting: 12.9  . Smokeless tobacco: Never Used  Substance Use Topics  . Alcohol use: Not Currently    Alcohol/week: 0.0 standard drinks    Comment: <2 monthly; rare    Current Outpatient Medications:  .  acetaminophen (TYLENOL) 650 MG CR tablet, Take 650 mg by mouth every 8 (eight) hours as needed for pain (Rarely uses this medication). , Disp: , Rfl:  .  Acetylcarnitine HCl (ACETYL L-CARNITINE) 250 MG CAPS, Take by mouth. 200 mg every other day, Disp: , Rfl:  .  albuterol (PROVENTIL HFA;VENTOLIN HFA) 108 (90 Base) MCG/ACT inhaler, Inhale 2 puffs into the lungs every 6 (six) hours as needed for wheezing or shortness of breath., Disp: 1 Inhaler, Rfl: 0 .  ALPRAZolam (XANAX) 0.25 MG tablet, TAKE 1 TO 1 & 1/2 TABLET BY MOUTH  AT BEDTIME AS NEEDED, Disp: 15 tablet, Rfl: 1 .  atorvastatin (LIPITOR) 80 MG tablet, TAKE 1 TABLET BY MOUTH EVERY DAY, Disp: 90 tablet, Rfl: 0 .  b complex vitamins tablet, Take 1 tablet by mouth daily.  , Disp: , Rfl:  .  Biotin 300 MCG TABS, Take 600 mcg by mouth every other day.  , Disp: , Rfl:  .  Calcium-Magnesium-Zinc 500-250-12.5 MG TABS, Take 1 tablet by mouth every other day.  , Disp: , Rfl:  .  Chromium  Picolinate 200 MCG TABS, Take 1 tablet by mouth every other day.  , Disp: , Rfl:  .  clobetasol cream (TEMOVATE) 0.05 %, APPLY A THIN LAYER TOPICALLY TO AFFECTED AREA(S) DAILY, Disp: , Rfl: 0 .  clopidogrel (PLAVIX) 75 MG tablet, TAKE 1 TABLET BY MOUTH EVERY DAY, Disp: 90 tablet, Rfl: 3 .  Coenzyme Q10 (COQ10) 200 MG CAPS, Take 1 capsule by mouth daily.  , Disp: , Rfl:  .  CRANBERRY JUICE EXTRACT PO, Take 500 mg by mouth daily.  , Disp: , Rfl:  .  EPINEPHrine 0.3 mg/0.3 mL IJ SOAJ injection, INJECT 0.3 MLS INTO THE MUSCLE ONCE FOR 1 DOSE, Disp: , Rfl: 0 .  famotidine (PEPCID AC) 10 MG tablet, Take 1 tablet (10 mg total) by mouth 2 (two) times daily. Take  1-2 tablets twice daily, Disp: 180 tablet, Rfl: 3 .  finasteride (PROSCAR) 5 MG tablet, Take 2.5 mg by mouth daily., Disp: , Rfl:  .  Flaxseed, Linseed, (FLAX SEED OIL) 1000 MG CAPS, Take 1,200 mg by mouth daily.  , Disp: , Rfl:  .  Garlic Oil [id number] MG TABS, Take 1 tablet by mouth daily.  , Disp: , Rfl:  .  losartan (COZAAR) 25 MG tablet, TAKE 1 TABLET BY MOUTH EVERY DAY, Disp: 30 tablet, Rfl: 0 .  meloxicam (MOBIC) 15 MG tablet, Take 1 tablet (15 mg total) by mouth daily., Disp: 10 tablet, Rfl: 0 .  metoprolol succinate (TOPROL-XL) 25 MG 24 hr tablet, Take 1 tablet (25 mg total) by mouth daily., Disp: 90 tablet, Rfl: 3 .  metoprolol tartrate (LOPRESSOR) 25 MG tablet, TAKE 1/2 TABLET BY MOUTH 2 TIMES DAILY, Disp: 90 tablet, Rfl: 3 .  nitroGLYCERIN (NITROSTAT) 0.4 MG SL tablet, Place 1 tablet (0.4 mg total) under the tongue every 5 (five) minutes x 3 doses as needed for chest pain., Disp: 25 tablet, Rfl: 3 .  omega-3 acid ethyl esters (LOVAZA) 1 g capsule, Take 1 capsule (1 g total) by mouth daily., Disp: 90 capsule, Rfl: 3 .  phenazopyridine (PYRIDIUM) 100 MG tablet, Take 1 tablet (100 mg total) by mouth 3 (three) times daily as needed for pain., Disp: 10 tablet, Rfl: 0 .  Probiotic Product (ALIGN PO), Take 1 capsule by mouth daily., Disp: , Rfl:  .   Rhodiola 300 MG CAPS, Take 1 capsule by mouth daily.  , Disp: , Rfl:  .  triamcinolone (KENALOG) 0.025 % ointment, After completion of 2 weeks of clobetasol. APPLY A THIN LAYER topically TO THE AFFECTED AREA(S) 2 TIMES PER DAY IF NEEDED, Disp: , Rfl: 3 .  valACYclovir (VALTREX) 1000 MG tablet, Take 1 tablet (1,000 mg total) by mouth 3 (three) times daily., Disp: 21 tablet, Rfl: 0  Current Facility-Administered Medications:  .  metoprolol succinate (TOPROL-XL) 24 hr tablet 12.5 mg, 12.5 mg, Oral, Daily, Brunetta Jeans, PA-C  Allergies  Allergen Reactions  . Benzonatate     Unknown, patient states "it made  me sick"  . Chlorpromazine Hcl     unknown  . Codeine     REACTION: causes vomiting  . Doxycycline     "explosive diarrhea" -does not wish to take this anymore  . Hydrocodone     REACTION: causes vomiing and nausea  . Hydrocodone-Acetaminophen Nausea And Vomiting  . Lovaza [Omega-3-Acid Ethyl Esters] Other (See Comments)    Loose stools with two capsules  . Penicillins     REACTION: causes rash  . Prochlorperazine Edisylate     unknown   . Promethazine Hcl     unknown  . Sulfonamide Derivatives     unknown     Objective:   BP 134/72   Wt 148 lb 8 oz (67.4 kg)   BMI 27.16 kg/m   Patient is well-developed, well-nourished in no acute distress.  Resting comfortably at home.  Head is normocephalic, atraumatic.  No labored breathing.  Speech is clear and coherent with logical content.  Patient is alert and oriented at baseline.   Assessment and Plan:   1. Medial epicondylitis of left elbow Mild epicondylitis versus inflammation of tendon insertions.  Seems to be triggered by prolonged flexion of elbow.  Did cause some inflammation in cubital tunnel as evidenced by temporary numbness and tingling of hands.  Has completely resolved now.  Supportive measures reviewed with patient.  Discussed need to change positioning every so often to prevent this.  Follow-up in office  for any recurrence of symptoms.  2. Fluttering sensation of heart Patient denies true palpitations.  Noted to short-lived fluttering sensation but have not recurred.  These occurred at rest.  Do not seem in any way ACS related.  Is keeping hydrated.  Supportive measures reviewed.  If recurring, would want to see her in office so that we can obtain baseline EKG, check labs and consider Holter study.  Recommend she watch diet --especially caffeine intake.    Leeanne Rio, PA-C 05/27/2019

## 2019-05-27 NOTE — Progress Notes (Signed)
I have discussed the procedure for the virtual visit with the patient who has given consent to proceed with assessment and treatment.   Jesica Goheen S Makaylie Dedeaux, CMA     

## 2019-05-27 NOTE — Patient Instructions (Signed)
Instructions sent to my chart.  I am glad that you are no longer having any symptoms. If you notice any recurrence, especially of the fluttering sensation, please see me in office. If you were to have any chest pain, shortness of breath or dizziness, I would want you to be seen in the nearest ER  The elbow symptoms seen to be a combination of tendon inflammation and a bit of ulnar nerve irritation based on the distribution of pain and the numbness and tingling.  I do agree that the symptoms were likely triggered by prolonged bending of the elbows while you are playing games.  Try to be very mindful of this in the future.  If there is a recurrence of symptoms, please come see me in office so that I can examine you.  Can certainly utilize the topical Voltaren as needed.  For the fluttering sensation, this is a bit more tricky.  Because there were 2 episodes that were extremely short-lived and not associated with any lightheadedness, dizziness or other concerning symptoms, this could have been some muscle spasming in the esophagus, or premature heartbeat.  Keep well-hydrated.  Avoid caffeine and alcohol.  Again as discussed, if there is any recurrence of symptoms I would want to see you in the office so we could obtain an EKG, labs and determine if holter monitor is needed.

## 2019-06-30 ENCOUNTER — Other Ambulatory Visit: Payer: Self-pay

## 2019-06-30 ENCOUNTER — Ambulatory Visit (INDEPENDENT_AMBULATORY_CARE_PROVIDER_SITE_OTHER): Payer: Medicare HMO | Admitting: Physician Assistant

## 2019-06-30 ENCOUNTER — Encounter: Payer: Self-pay | Admitting: Physician Assistant

## 2019-06-30 VITALS — BP 120/76 | HR 66 | Temp 98.1°F | Resp 16 | Ht 62.0 in | Wt 151.0 lb

## 2019-06-30 DIAGNOSIS — M545 Low back pain, unspecified: Secondary | ICD-10-CM

## 2019-06-30 LAB — POCT URINALYSIS DIPSTICK
Bilirubin, UA: NEGATIVE
Blood, UA: NEGATIVE
Glucose, UA: NEGATIVE
Ketones, UA: NEGATIVE
Leukocytes, UA: NEGATIVE
Nitrite, UA: NEGATIVE
Protein, UA: NEGATIVE
Spec Grav, UA: >=1.03 — AB (ref 1.010–1.025)
Urobilinogen, UA: 0.2 E.U./dL
pH, UA: 5.5 (ref 5.0–8.0)

## 2019-06-30 MED ORDER — GABAPENTIN 100 MG PO CAPS: ORAL_CAPSULE | ORAL | 0 refills | Status: DC

## 2019-06-30 NOTE — Progress Notes (Signed)
Patient presents to clinic today c/o 2 weeks of pain and right lower back, buttock and hip with a rash.  Patient with history of shingles in this area.  Just noted the rash yesterday but unsure of how long it has been present.  Patient denies fever, chills.  Denies itching.  Does note some burning and tingling in the area with a dull achy pain in her hip.  Pain is about a 1 out of 10 at present.  Patient denies any trauma or injury.  Denies radiation of pain elsewhere.  Has taken some Motrin which did help.  Patient also wanting her urine checked today just to make sure no UTI present.  Denies dysuria, urinary urgency or frequency.  Past Medical History:  Diagnosis Date  . Anxiety    no anxiety  . CAD (coronary artery disease)   . Cataract   . Chronic insomnia   . Colon polyp   . Depression   . GERD (gastroesophageal reflux disease)   . GERD with esophagitis   . Heart attack ([place]) 1-09  . Heart murmur   . Hyperlipemia   . Hypertension   . Perirectal abscess   . Shingles 08/31/2017   2nd dx with shingles, per pt    Current Outpatient Medications on File Prior to Visit  Medication Sig Dispense Refill  . acetaminophen (TYLENOL) 650 MG CR tablet Take 650 mg by mouth every 8 (eight) hours as needed for pain (Rarely uses this medication).     . Acetylcarnitine HCl (ACETYL L-CARNITINE) 250 MG CAPS Take by mouth. 200 mg every other day    . albuterol (PROVENTIL HFA;VENTOLIN HFA) 108 (90 Base) MCG/ACT inhaler Inhale 2 puffs into the lungs every 6 (six) hours as needed for wheezing or shortness of breath. 1 Inhaler 0  . ALPRAZolam (XANAX) 0.25 MG tablet TAKE 1 TO 1 & 1/2 TABLET BY MOUTH AT BEDTIME AS NEEDED 15 tablet 1  . atorvastatin (LIPITOR) 80 MG tablet TAKE 1 TABLET BY MOUTH EVERY DAY 90 tablet 0  . b complex vitamins tablet Take 1 tablet by mouth daily.      . Biotin 300 MCG TABS Take 600 mcg by mouth every other day.      . Calcium-Magnesium-Zinc 500-250-12.5 MG TABS Take 1 tablet by  mouth every other day.      . Chromium Picolinate 200 MCG TABS Take 1 tablet by mouth every other day.      . clobetasol cream (TEMOVATE) 0.05 % APPLY A THIN LAYER TOPICALLY TO AFFECTED AREA(S) DAILY  0  . clopidogrel (PLAVIX) 75 MG tablet TAKE 1 TABLET BY MOUTH EVERY DAY 90 tablet 3  . Coenzyme Q10 (COQ10) 200 MG CAPS Take 1 capsule by mouth daily.      Marland Kitchen CRANBERRY JUICE EXTRACT PO Take 500 mg by mouth daily.      Marland Kitchen EPINEPHrine 0.3 mg/0.3 mL IJ SOAJ injection INJECT 0.3 MLS INTO THE MUSCLE ONCE FOR 1 DOSE  0  . famotidine (PEPCID AC) 10 MG tablet Take 1 tablet (10 mg total) by mouth 2 (two) times daily. Take  1-2 tablets twice daily 180 tablet 3  . finasteride (PROSCAR) 5 MG tablet Take 2.5 mg by mouth daily.    . Flaxseed, Linseed, (FLAX SEED OIL) 1000 MG CAPS Take 1,200 mg by mouth daily.      . Garlic Oil [id number] MG TABS Take 1 tablet by mouth daily.      Marland Kitchen losartan (COZAAR) 25 MG tablet TAKE  1 TABLET BY MOUTH EVERY DAY 30 tablet 0  . meloxicam (MOBIC) 15 MG tablet Take 1 tablet (15 mg total) by mouth daily. 10 tablet 0  . metoprolol succinate (TOPROL-XL) 25 MG 24 hr tablet Take 1 tablet (25 mg total) by mouth daily. 90 tablet 3  . metoprolol tartrate (LOPRESSOR) 25 MG tablet TAKE 1/2 TABLET BY MOUTH 2 TIMES DAILY 90 tablet 3  . nitroGLYCERIN (NITROSTAT) 0.4 MG SL tablet Place 1 tablet (0.4 mg total) under the tongue every 5 (five) minutes x 3 doses as needed for chest pain. 25 tablet 3  . omega-3 acid ethyl esters (LOVAZA) 1 g capsule Take 1 capsule (1 g total) by mouth daily. 90 capsule 3  . phenazopyridine (PYRIDIUM) 100 MG tablet Take 1 tablet (100 mg total) by mouth 3 (three) times daily as needed for pain. 10 tablet 0  . Probiotic Product (ALIGN PO) Take 1 capsule by mouth daily.    . Rhodiola 300 MG CAPS Take 1 capsule by mouth daily.      Marland Kitchen triamcinolone (KENALOG) 0.025 % ointment After completion of 2 weeks of clobetasol. APPLY A THIN LAYER topically TO THE AFFECTED AREA(S) 2 TIMES PER  DAY IF NEEDED  3  . valACYclovir (VALTREX) 1000 MG tablet Take 1 tablet (1,000 mg total) by mouth 3 (three) times daily. 21 tablet 0   Current Facility-Administered Medications on File Prior to Visit  Medication Dose Route Frequency Provider Last Rate Last Admin  . metoprolol succinate (TOPROL-XL) 24 hr tablet 12.5 mg  12.5 mg Oral Daily Brunetta Jeans, PA-C        Allergies  Allergen Reactions  . Benzonatate     Unknown, patient states "it made me sick"  . Chlorpromazine Hcl     unknown  . Codeine     REACTION: causes vomiting  . Doxycycline     "explosive diarrhea" -does not wish to take this anymore  . Hydrocodone     REACTION: causes vomiing and nausea  . Hydrocodone-Acetaminophen Nausea And Vomiting  . Lovaza [Omega-3-Acid Ethyl Esters] Other (See Comments)    Loose stools with two capsules  . Penicillins     REACTION: causes rash  . Prochlorperazine Edisylate     unknown   . Promethazine Hcl     unknown  . Sulfonamide Derivatives     unknown     Family History  Problem Relation Age of Onset  . Skin cancer Mother   . Allergy (severe) Father   . Heart disease Father   . Leukemia Father        CML  . Renal Disease Paternal Grandfather        one removed   . Colon cancer Neg Hx   . Colon polyps Neg Hx   . Rectal cancer Neg Hx   . Stomach cancer Neg Hx     Social History   Socioeconomic History  . Marital status: Married    Spouse name: Not on file  . Number of children: 2  . Years of education: Not on file  . Highest education level: Not on file  Occupational History  . Occupation: Risk manager  Tobacco Use  . Smoking status: Former Smoker    Packs/day: 2.00    Years: 20.00    Pack years: 40.00    Types: Cigarettes    Quit date: 06/19/2006    Years since quitting: 13.0  . Smokeless tobacco: Never Used  Substance and Sexual Activity  . Alcohol  use: Not Currently    Alcohol/week: 0.0 standard drinks    Comment: <2 monthly; rare  . Drug  use: No  . Sexual activity: Not on file  Other Topics Concern  . Not on file  Social History Narrative   Resides in [place] with her husband.    Husband works in [place] and comes home two to three times a year   2 children who go to [place]   Just got accepted to [employer]   Exercise- NO   Social Determinants of Health   Financial Resource Strain:   . Difficulty of Paying Living Expenses: Not on file  Food Insecurity:   . Worried About Charity fundraiser in the Last Year: Not on file  . Ran Out of Food in the Last Year: Not on file  Transportation Needs:   . Lack of Transportation (Medical): Not on file  . Lack of Transportation (Non-Medical): Not on file  Physical Activity:   . Days of Exercise per Week: Not on file  . Minutes of Exercise per Session: Not on file  Stress:   . Feeling of Stress : Not on file  Social Connections:   . Frequency of Communication with Friends and Family: Not on file  . Frequency of Social Gatherings with Friends and Family: Not on file  . Attends Religious Services: Not on file  . Active Member of Clubs or Organizations: Not on file  . Attends Archivist Meetings: Not on file  . Marital Status: Not on file   Review of Systems - See HPI.  All other ROS are negative.  Wt 151 lb (68.5 kg)   BMI 27.62 kg/m   Physical Exam Vitals reviewed.  Constitutional:      Appearance: Normal appearance.  HENT:     Head: Normocephalic and atraumatic.  Skin:      Neurological:     General: No focal deficit present.     Mental Status: She is alert. Mental status is at baseline.  Psychiatric:        Mood and Affect: Mood normal.     Assessment/Plan: 1. Acute right-sided low back pain without sciatica Urine dip negative for sign of infection so reassurance given to patient. Shingles rash, healing, noted on examination. No active vesicles. Gabapentin offered but patient declined. Supportive measures and OTC meds reviewed. Follow-up if not  resolving.  - POCT urinalysis dipstick  This visit occurred during the SARS-CoV-2 public health emergency.  Safety protocols were in place, including screening questions prior to the visit, additional usage of staff PPE, and extensive cleaning of exam room while observing appropriate contact time as indicated for disinfecting solutions.      Leeanne Rio, PA-C

## 2019-06-30 NOTE — Patient Instructions (Signed)
Urine is clear today which is great.  It seems symptoms have stemmed from a shingles outbreak. This is healing as lesions are crusted over.  For the pain I have sent in Gabapentin to take as directed over the next couple of weeks. If doing well, call me and we will wean back off of the medication.   Call me if there are any non-improving or worsening symptoms.  We will plan on Shingles vaccine in 3 months.

## 2019-07-15 DIAGNOSIS — L658 Other specified nonscarring hair loss: Secondary | ICD-10-CM | POA: Diagnosis not present

## 2019-07-15 DIAGNOSIS — Z840 Family history of diseases of the skin and subcutaneous tissue: Secondary | ICD-10-CM | POA: Diagnosis not present

## 2019-08-13 ENCOUNTER — Other Ambulatory Visit: Payer: Self-pay | Admitting: Physician Assistant

## 2019-08-13 NOTE — Telephone Encounter (Signed)
Will this medication be a continuation or just for an acute problem?

## 2019-08-21 ENCOUNTER — Other Ambulatory Visit: Payer: Self-pay | Admitting: Physician Assistant

## 2019-08-21 ENCOUNTER — Other Ambulatory Visit (HOSPITAL_COMMUNITY): Payer: Self-pay | Admitting: Cardiology

## 2019-08-26 ENCOUNTER — Other Ambulatory Visit: Payer: Self-pay | Admitting: Physician Assistant

## 2019-09-10 DIAGNOSIS — L218 Other seborrheic dermatitis: Secondary | ICD-10-CM | POA: Diagnosis not present

## 2019-09-12 DIAGNOSIS — H2513 Age-related nuclear cataract, bilateral: Secondary | ICD-10-CM | POA: Diagnosis not present

## 2019-09-12 DIAGNOSIS — H04123 Dry eye syndrome of bilateral lacrimal glands: Secondary | ICD-10-CM | POA: Diagnosis not present

## 2019-09-12 DIAGNOSIS — H02839 Dermatochalasis of unspecified eye, unspecified eyelid: Secondary | ICD-10-CM | POA: Diagnosis not present

## 2019-09-12 DIAGNOSIS — I10 Essential (primary) hypertension: Secondary | ICD-10-CM | POA: Diagnosis not present

## 2019-09-16 DIAGNOSIS — R69 Illness, unspecified: Secondary | ICD-10-CM | POA: Diagnosis not present

## 2019-09-24 ENCOUNTER — Telehealth: Payer: Self-pay | Admitting: Physician Assistant

## 2019-09-24 NOTE — Telephone Encounter (Signed)
Spoke with patient and advised she can get the Shingles vaccine here at the office. Unsure if insurance will pay for vaccine but patient is admit about getting there vaccine here at the office. Patient scheduled for Monday 09/29/19 at 1:30.

## 2019-09-24 NOTE — Telephone Encounter (Signed)
Ok for shot since has been > 6 months since shingles outbreak. If she is ok with insurance potentially not covering her shingles shot and wants to proceed despite this, ok to schedule. She can also have at pharmacy which is typically covered more with Medicare.

## 2019-09-24 NOTE — Telephone Encounter (Signed)
Please advise about shingles vaccine

## 2019-09-24 NOTE — Telephone Encounter (Signed)
Pt called in asking to get her shingles shot at our office. She states that she is aware that her insurance will not cover it here in the office but she doesn't want to go to a pharmacy and get it done. Please advise

## 2019-09-25 DIAGNOSIS — Z1231 Encounter for screening mammogram for malignant neoplasm of breast: Secondary | ICD-10-CM | POA: Diagnosis not present

## 2019-09-25 LAB — HM MAMMOGRAPHY

## 2019-09-26 ENCOUNTER — Encounter: Payer: Self-pay | Admitting: Physician Assistant

## 2019-09-29 ENCOUNTER — Ambulatory Visit (INDEPENDENT_AMBULATORY_CARE_PROVIDER_SITE_OTHER): Payer: Medicare HMO

## 2019-09-29 ENCOUNTER — Other Ambulatory Visit: Payer: Self-pay

## 2019-09-29 ENCOUNTER — Encounter: Payer: Self-pay | Admitting: Physician Assistant

## 2019-09-29 DIAGNOSIS — Z23 Encounter for immunization: Secondary | ICD-10-CM

## 2019-09-29 NOTE — Progress Notes (Signed)
Emily [name] is a 69 y.o. female presents to the office today for Shingrix , per physician's orders. Patient was made aware, multiple times, that her insurance would not cover today's visit. Patient voiced understanding and was still in agreement to get injection in office.   Zoxter Vaccine Recombinant, Adjuvanted Shingrix 0.76mL,  intramuscularly was administered in the left deltoid  today. Patient tolerated injection well.  Patient next injection due:2-4 months. Appt was not made.  Fritz Pickerel, LPN

## 2019-10-02 ENCOUNTER — Encounter: Payer: Self-pay | Admitting: Physician Assistant

## 2019-10-02 ENCOUNTER — Telehealth: Payer: Self-pay | Admitting: Physician Assistant

## 2019-10-02 NOTE — Telephone Encounter (Signed)
Pt called in stating that she is having some redness around the injection sight of the shingles vac. She states that it hot to the touch and sore. She wanted to know if this is normal because she has to get a 2nd shingles vac. In a few mths. Please advise she is aware that cody is already gone for the day.

## 2019-10-02 NOTE — Telephone Encounter (Signed)
Responded to patient's concerns via MyChart as she had messaged there as well.

## 2019-10-06 DIAGNOSIS — H6121 Impacted cerumen, right ear: Secondary | ICD-10-CM | POA: Diagnosis not present

## 2019-10-07 ENCOUNTER — Telehealth: Payer: Self-pay | Admitting: Physician Assistant

## 2019-10-07 NOTE — Telephone Encounter (Signed)
Attempted to schedule AWV. Unable to LVM.  Will try at later time. No voicemail. 

## 2019-10-14 ENCOUNTER — Encounter: Payer: Self-pay | Admitting: Physician Assistant

## 2019-10-14 ENCOUNTER — Ambulatory Visit (INDEPENDENT_AMBULATORY_CARE_PROVIDER_SITE_OTHER): Payer: Medicare HMO | Admitting: Physician Assistant

## 2019-10-14 ENCOUNTER — Other Ambulatory Visit: Payer: Self-pay

## 2019-10-14 VITALS — BP 122/70 | HR 63 | Temp 98.1°F | Resp 14 | Ht 62.0 in | Wt 153.0 lb

## 2019-10-14 DIAGNOSIS — I10 Essential (primary) hypertension: Secondary | ICD-10-CM

## 2019-10-14 NOTE — Progress Notes (Signed)
Patient presents to clinic today c/o elevated blood pressure readings noted last night.  Patient states she noted feeling some mild pressure behind her eyes.  Check blood pressure reading with her home cuff and noted blood pressure elevated at [id number] systolic.  Notes trying to relax, checking her blood pressure several more times with blood pressure still ranging between 1 [id number] systolic. Patient denies chest pain, palpitations, lightheadedness, dizziness, vision changes or frequent headaches.  Denies any recent change to diet or exercise.  Has been keeping well hydrated.  Notes she has not been regularly checking blood pressure.  Notes feeling anxious about elevated blood pressure readings noted last night.  Of note she does endorse eating a bag of chips yesterday afternoon/evening.  Has felt fine today.  Past Medical History:  Diagnosis Date  . Anxiety    no anxiety  . CAD (coronary artery disease)   . Cataract   . Chronic insomnia   . Colon polyp   . Depression   . GERD (gastroesophageal reflux disease)   . GERD with esophagitis   . Heart attack ([place]) 1-09  . Heart murmur   . Hyperlipemia   . Hypertension   . Perirectal abscess   . Shingles 08/31/2017   2nd dx with shingles, per pt    Current Outpatient Medications on File Prior to Visit  Medication Sig Dispense Refill  . acetaminophen (TYLENOL) 650 MG CR tablet Take 650 mg by mouth every 8 (eight) hours as needed for pain (Rarely uses this medication).     . Acetylcarnitine HCl (ACETYL L-CARNITINE) 250 MG CAPS Take by mouth. 200 mg every other day    . albuterol (PROVENTIL HFA;VENTOLIN HFA) 108 (90 Base) MCG/ACT inhaler Inhale 2 puffs into the lungs every 6 (six) hours as needed for wheezing or shortness of breath. 1 Inhaler 0  . ALPRAZolam (XANAX) 0.25 MG tablet TAKE 1 TO 1 & 1/2 TABLET BY MOUTH AT BEDTIME AS NEEDED 15 tablet 1  . atorvastatin (LIPITOR) 80 MG tablet TAKE 1 TABLET BY MOUTH EVERY DAY 90 tablet 1  . b complex  vitamins tablet Take 1 tablet by mouth daily.      . Biotin 300 MCG TABS Take 600 mcg by mouth every other day.      . Calcium-Magnesium-Zinc 500-250-12.5 MG TABS Take 1 tablet by mouth every other day.      . Chromium Picolinate 200 MCG TABS Take 1 tablet by mouth every other day.      . clopidogrel (PLAVIX) 75 MG tablet TAKE 1 TABLET BY MOUTH EVERY DAY 90 tablet 3  . Coenzyme Q10 (COQ10) 200 MG CAPS Take 1 capsule by mouth daily.      Marland Kitchen CRANBERRY JUICE EXTRACT PO Take 500 mg by mouth daily.      Marland Kitchen EPINEPHrine 0.3 mg/0.3 mL IJ SOAJ injection INJECT 0.3 MLS INTO THE MUSCLE ONCE FOR 1 DOSE  0  . famotidine (PEPCID AC) 10 MG tablet Take 1 tablet (10 mg total) by mouth 2 (two) times daily. Take  1-2 tablets twice daily 180 tablet 3  . finasteride (PROSCAR) 5 MG tablet Take 2.5 mg by mouth daily.    . Flaxseed, Linseed, (FLAX SEED OIL) 1000 MG CAPS Take 1,200 mg by mouth daily.      . Garlic Oil [id number] MG TABS Take 1 tablet by mouth daily.      . metoprolol succinate (TOPROL-XL) 25 MG 24 hr tablet Take 1 tablet (25 mg total) by mouth daily.  90 tablet 3  . nitroGLYCERIN (NITROSTAT) 0.4 MG SL tablet Place 1 tablet (0.4 mg total) under the tongue every 5 (five) minutes x 3 doses as needed for chest pain. 25 tablet 3  . omega-3 acid ethyl esters (LOVAZA) 1 g capsule Take 1 capsule (1 g total) by mouth daily. 90 capsule 3  . Probiotic Product (ALIGN PO) Take 1 capsule by mouth daily.    . Rhodiola 300 MG CAPS Take 1 capsule by mouth daily.       Current Facility-Administered Medications on File Prior to Visit  Medication Dose Route Frequency Provider Last Rate Last Admin  . metoprolol succinate (TOPROL-XL) 24 hr tablet 12.5 mg  12.5 mg Oral Daily Brunetta Jeans, PA-C        Allergies  Allergen Reactions  . Benzonatate     Unknown, patient states "it made me sick"  . Chlorpromazine Hcl     unknown  . Codeine     REACTION: causes vomiting  . Doxycycline     "explosive diarrhea" -does not wish to  take this anymore  . Hydrocodone     REACTION: causes vomiing and nausea  . Hydrocodone-Acetaminophen Nausea And Vomiting  . Lovaza [Omega-3-Acid Ethyl Esters] Other (See Comments)    Loose stools with two capsules  . Penicillins     REACTION: causes rash  . Prochlorperazine Edisylate     unknown   . Promethazine Hcl     unknown  . Sulfonamide Derivatives     unknown     Family History  Problem Relation Age of Onset  . Skin cancer Mother   . Allergy (severe) Father   . Heart disease Father   . Leukemia Father        CML  . Renal Disease Paternal Grandfather        one removed   . Colon cancer Neg Hx   . Colon polyps Neg Hx   . Rectal cancer Neg Hx   . Stomach cancer Neg Hx     Social History   Socioeconomic History  . Marital status: Married    Spouse name: Not on file  . Number of children: 2  . Years of education: Not on file  . Highest education level: Not on file  Occupational History  . Occupation: Risk manager  Tobacco Use  . Smoking status: Former Smoker    Packs/day: 2.00    Years: 20.00    Pack years: 40.00    Types: Cigarettes    Quit date: 06/19/2006    Years since quitting: 13.3  . Smokeless tobacco: Never Used  Substance and Sexual Activity  . Alcohol use: Not Currently    Alcohol/week: 0.0 standard drinks    Comment: <2 monthly; rare  . Drug use: No  . Sexual activity: Not on file  Other Topics Concern  . Not on file  Social History Narrative   Resides in [place] with her husband.    Husband works in [place] and comes home two to three times a year   2 children who go to [place]   Just got accepted to [employer]   Exercise- NO   Social Determinants of Health   Financial Resource Strain:   . Difficulty of Paying Living Expenses:   Food Insecurity:   . Worried About Charity fundraiser in the Last Year:   . Arboriculturist in the Last Year:   Transportation Needs:   . Film/video editor (Medical):   Marland Kitchen  Lack of Transportation  (Non-Medical):   Physical Activity:   . Days of Exercise per Week:   . Minutes of Exercise per Session:   Stress:   . Feeling of Stress :   Social Connections:   . Frequency of Communication with Friends and Family:   . Frequency of Social Gatherings with Friends and Family:   . Attends Religious Services:   . Active Member of Clubs or Organizations:   . Attends Archivist Meetings:   Marland Kitchen Marital Status:    Review of Systems - See HPI.  All other ROS are negative.  BP 122/70   Pulse 63   Temp 98.1 F (36.7 C) (Temporal)   Resp 14   Ht 5\' 2"  (1.575 m)   Wt 153 lb (69.4 kg)   SpO2 98%   BMI 27.98 kg/m   Physical Exam Vitals reviewed.  Constitutional:      Appearance: Normal appearance.  HENT:     Head: Normocephalic and atraumatic.  Cardiovascular:     Rate and Rhythm: Normal rate and regular rhythm.     Pulses: Normal pulses.     Heart sounds: Normal heart sounds.  Pulmonary:     Effort: Pulmonary effort is normal.     Breath sounds: Normal breath sounds.  Musculoskeletal:     Cervical back: Neck supple.  Neurological:     General: No focal deficit present.     Mental Status: She is alert and oriented to person, place, and time.     Recent Results (from the past 2160 hour(s))  HM MAMMOGRAPHY     Status: None   Collection Time: 09/25/19 12:00 AM  Result Value Ref Range   HM Mammogram 0-4 Bi-Rad 0-4 Bi-Rad, Self Reported Normal    Comment: Bi-Rads 1 Negative    Assessment/Plan: 1. HYPERTENSION, MILD Is taking her medications as directed.  Blood pressure normotensive today in office, checked in both arms manually.  Her blood pressure cuff revealed blood pressure at 150/80.  On further questioning she notes her blood pressure cuff was purchased around 2006.  Recommend she get a new cuff.  Check ambulatory blood pressure readings twice daily over the next week and record.  Patient encouraged to limit salt intake.  DASH diet handout given.  She will  follow-up with me via phone/video in 1 week to reassess.  This visit occurred during the SARS-CoV-2 public health emergency.  Safety protocols were in place, including screening questions prior to the visit, additional usage of staff PPE, and extensive cleaning of exam room while observing appropriate contact time as indicated for disinfecting solutions.     Leeanne Rio, PA-C

## 2019-10-14 NOTE — Patient Instructions (Addendum)
Please keep a low-salt diet (see below). Keep hydrated but try to limit caffeine. Continue medications as directed.  Get a new BP cuff and check BP twice daily as directed. Record these numbers so we can review in a week.   DASH Eating Plan DASH stands for "Dietary Approaches to Stop Hypertension." The DASH eating plan is a healthy eating plan that has been shown to reduce high blood pressure (hypertension). It may also reduce your risk for type 2 diabetes, heart disease, and stroke. The DASH eating plan may also help with weight loss. What are tips for following this plan?  General guidelines  Avoid eating more than 2,300 mg (milligrams) of salt (sodium) a day. If you have hypertension, you may need to reduce your sodium intake to 1,500 mg a day.  Limit alcohol intake to no more than 1 drink a day for nonpregnant women and 2 drinks a day for men. One drink equals 12 oz of beer, 5 oz of wine, or 1 oz of hard liquor.  Work with your health care provider to maintain a healthy body weight or to lose weight. Ask what an ideal weight is for you.  Get at least 30 minutes of exercise that causes your heart to beat faster (aerobic exercise) most days of the week. Activities may include walking, swimming, or biking.  Work with your health care provider or diet and nutrition specialist (dietitian) to adjust your eating plan to your individual calorie needs. Reading food labels   Check food labels for the amount of sodium per serving. Choose foods with less than 5 percent of the Daily Value of sodium. Generally, foods with less than 300 mg of sodium per serving fit into this eating plan.  To find whole grains, look for the word "whole" as the first word in the ingredient list. Shopping  Buy products labeled as "low-sodium" or "no salt added."  Buy fresh foods. Avoid canned foods and premade or frozen meals. Cooking  Avoid adding salt when cooking. Use salt-free seasonings or herbs instead  of table salt or sea salt. Check with your health care provider or pharmacist before using salt substitutes.  Do not fry foods. Cook foods using healthy methods such as baking, boiling, grilling, and broiling instead.  Cook with heart-healthy oils, such as olive, canola, soybean, or sunflower oil. Meal planning  Eat a balanced diet that includes: ? 5 or more servings of fruits and vegetables each day. At each meal, try to fill half of your plate with fruits and vegetables. ? Up to 6-8 servings of whole grains each day. ? Less than 6 oz of lean meat, poultry, or fish each day. A 3-oz serving of meat is about the same size as a deck of cards. One egg equals 1 oz. ? 2 servings of low-fat dairy each day. ? A serving of nuts, seeds, or beans 5 times each week. ? Heart-healthy fats. Healthy fats called Omega-3 fatty acids are found in foods such as flaxseeds and coldwater fish, like sardines, salmon, and mackerel.  Limit how much you eat of the following: ? Canned or prepackaged foods. ? Food that is high in trans fat, such as fried foods. ? Food that is high in saturated fat, such as fatty meat. ? Sweets, desserts, sugary drinks, and other foods with added sugar. ? Full-fat dairy products.  Do not salt foods before eating.  Try to eat at least 2 vegetarian meals each week.  Eat more home-cooked food and less  restaurant, buffet, and fast food.  When eating at a restaurant, ask that your food be prepared with less salt or no salt, if possible. What foods are recommended? The items listed may not be a complete list. Talk with your dietitian about what dietary choices are best for you. Grains Whole-grain or whole-wheat bread. Whole-grain or whole-wheat pasta. Brown rice. [place] Morrow. Bulgur. Whole-grain and low-sodium cereals. Pita bread. Low-fat, low-sodium crackers. Whole-wheat flour tortillas. Vegetables Fresh or frozen vegetables (raw, steamed, roasted, or grilled). Low-sodium or  reduced-sodium tomato and vegetable juice. Low-sodium or reduced-sodium tomato sauce and tomato paste. Low-sodium or reduced-sodium canned vegetables. Fruits All fresh, dried, or frozen fruit. Canned fruit in natural juice (without added sugar). Meat and other protein foods Skinless chicken or [place]. Ground chicken or [place]. Pork with fat trimmed off. Fish and seafood. Egg whites. Dried beans, peas, or lentils. Unsalted nuts, nut butters, and seeds. Unsalted canned beans. Lean cuts of beef with fat trimmed off. Low-sodium, lean deli meat. Dairy Low-fat (1%) or fat-free (skim) milk. Fat-free, low-fat, or reduced-fat cheeses. Nonfat, low-sodium ricotta or cottage cheese. Low-fat or nonfat yogurt. Low-fat, low-sodium cheese. Fats and oils Soft margarine without trans fats. Vegetable oil. Low-fat, reduced-fat, or light mayonnaise and salad dressings (reduced-sodium). Canola, safflower, olive, soybean, and sunflower oils. Avocado. Seasoning and other foods Herbs. Spices. Seasoning mixes without salt. Unsalted popcorn and pretzels. Fat-free sweets. What foods are not recommended? The items listed may not be a complete list. Talk with your dietitian about what dietary choices are best for you. Grains Baked goods made with fat, such as croissants, muffins, or some breads. Dry pasta or rice meal packs. Vegetables Creamed or fried vegetables. Vegetables in a cheese sauce. Regular canned vegetables (not low-sodium or reduced-sodium). Regular canned tomato sauce and paste (not low-sodium or reduced-sodium). Regular tomato and vegetable juice (not low-sodium or reduced-sodium). Angie Fava. Olives. Fruits Canned fruit in a light or heavy syrup. Fried fruit. Fruit in cream or butter sauce. Meat and other protein foods Fatty cuts of meat. Ribs. Fried meat. Berniece Salines. Sausage. Bologna and other processed lunch meats. Salami. Fatback. Hotdogs. Bratwurst. Salted nuts and seeds. Canned beans with added salt. Canned or  smoked fish. Whole eggs or egg yolks. Chicken or [place] with skin. Dairy Whole or 2% milk, cream, and half-and-half. Whole or full-fat cream cheese. Whole-fat or sweetened yogurt. Full-fat cheese. Nondairy creamers. Whipped toppings. Processed cheese and cheese spreads. Fats and oils Butter. Stick margarine. Lard. Shortening. Ghee. Bacon fat. Tropical oils, such as coconut, palm kernel, or palm oil. Seasoning and other foods Salted popcorn and pretzels. Onion salt, garlic salt, seasoned salt, table salt, and sea salt. Worcestershire sauce. Tartar sauce. Barbecue sauce. Teriyaki sauce. Soy sauce, including reduced-sodium. Steak sauce. Canned and packaged gravies. Fish sauce. Oyster sauce. Cocktail sauce. Horseradish that you find on the shelf. Ketchup. Mustard. Meat flavorings and tenderizers. Bouillon cubes. Hot sauce and Tabasco sauce. Premade or packaged marinades. Premade or packaged taco seasonings. Relishes. Regular salad dressings. Where to find more information:  National Heart, Lung, and [place]: https://wilson-eaton.com/  American Heart Association: www.heart.org Summary  The DASH eating plan is a healthy eating plan that has been shown to reduce high blood pressure (hypertension). It may also reduce your risk for type 2 diabetes, heart disease, and stroke.  With the DASH eating plan, you should limit salt (sodium) intake to 2,300 mg a day. If you have hypertension, you may need to reduce your sodium intake to 1,500 mg a day.  When on the DASH eating plan, aim to eat more fresh fruits and vegetables, whole grains, lean proteins, low-fat dairy, and heart-healthy fats.  Work with your health care provider or diet and nutrition specialist (dietitian) to adjust your eating plan to your individual calorie needs. This information is not intended to replace advice given to you by your health care provider. Make sure you discuss any questions you have with your health care provider. Document  Revised: 05/18/2017 Document Reviewed: 05/29/2016 Elsevier Patient Education  2020 [employer].

## 2019-10-29 ENCOUNTER — Other Ambulatory Visit (HOSPITAL_COMMUNITY): Payer: Self-pay | Admitting: Cardiology

## 2019-11-26 ENCOUNTER — Encounter: Payer: Self-pay | Admitting: Gastroenterology

## 2019-11-26 ENCOUNTER — Ambulatory Visit: Payer: Medicare HMO | Admitting: Gastroenterology

## 2019-11-26 ENCOUNTER — Telehealth: Payer: Self-pay

## 2019-11-26 VITALS — BP 124/70 | HR 59 | Ht 62.0 in | Wt 153.8 lb

## 2019-11-26 DIAGNOSIS — Z01818 Encounter for other preprocedural examination: Secondary | ICD-10-CM | POA: Diagnosis not present

## 2019-11-26 DIAGNOSIS — Z86010 Personal history of colonic polyps: Secondary | ICD-10-CM

## 2019-11-26 DIAGNOSIS — K219 Gastro-esophageal reflux disease without esophagitis: Secondary | ICD-10-CM | POA: Diagnosis not present

## 2019-11-26 MED ORDER — SUTAB 1479-225-188 MG PO TABS: 1.0000 | ORAL_TABLET | ORAL | 0 refills | Status: DC

## 2019-11-26 NOTE — Patient Instructions (Signed)
You can titrate your pepcid as needed. Also, take over the counter Gaviscon as needed.   You have been scheduled for a colonoscopy. Please follow written instructions given to you at your visit today.  Please pick up your prep supplies at the pharmacy within the next 1-3 days. If you use inhalers (even only as needed), please bring them with you on the day of your procedure.

## 2019-11-26 NOTE — Progress Notes (Signed)
HPI :  69 year old female here for a follow-up for reflux.  I have not seen her since July 2019.  At her last visit in 2019 she had an upper endoscopy showing a small hiatal hernia but no evidence of Barrett's esophagus or esophagitis while on Zantac.  She has preferred to avoid PPI and has been managing this since then with Pepcid, given the recall of Zantac.  She has been on a variety of dosing of Pepcid ranging from 10 mg once or twice a day to high of 20 mg twice daily, also using Gaviscon as needed.  She states in general the Pepcid she thinks controls her symptoms for the most part if she avoids trigger foods.  If she eats a trigger food she will have easy breakthrough.  She has been drinking a lot of milk which helps decrease her symptoms when she has them.  Particular chocolate or pizza can make her feel worse.  Her symptoms mainly bother her at night, she does better during the day.  She does have a family history of osteoporosis but her last DEXA scan in 2019 was normal.  She has a lot hesitation about using PPIs and we discussed that.  Otherwise she has a history of coronary artery disease, stent in place, on Plavix since 2009.  Echo in 2016 show EF 55 to 60%.  She is doing pretty well in this regard.  No problems with her bowels.  No blood in her stools.  She last had a colonoscopy in 2017 showing six small precancerous polyps removed, she is due for colonoscopy at this time  Prior work-up: EGD 07/30/2015 - grade B esophagitis, squamous papillomas, normal stomach / duodenum. Biopsies showed no evidence of Barrett's.  Colonoscopy 07/30/2015 - showed 6 small polyps removed, adneomas / serrated polyps, due for recall in 3 years.  EGD 03/19/18 -  - The Z-line was regular. No evidence of Barrett's. Prior esophagitis has since healed - A 2 cm hiatal hernia was present. - A diverticulum with a small opening was found at the gastroesophageal junction. - The exam of the esophagus was otherwise  normal. - The entire examined stomach was normal. - The duodenal bulb and second portion of the duodenum were normal.    Past Medical History:  Diagnosis Date  . Anxiety    no anxiety  . CAD (coronary artery disease)   . Cataract   . Chronic insomnia   . Colon polyp   . Depression   . GERD (gastroesophageal reflux disease)   . GERD with esophagitis   . Heart attack ([place]) 1-09  . Heart murmur   . Hyperlipemia   . Hypertension   . Perirectal abscess   . Shingles 08/31/2017   2nd dx with shingles, per pt     Past Surgical History:  Procedure Laterality Date  . CESAREAN SECTION     x 2  . COLONOSCOPY    . COLONOSCOPY W/ POLYPECTOMY  2007  . INCISION AND DRAINAGE ABSCESS ANAL    . POLYPECTOMY    . PTCA with stent to third obtuse marginal    . STOMACH SURGERY     Family History  Problem Relation Age of Onset  . Skin cancer Mother   . Allergy (severe) Father   . Heart disease Father   . Leukemia Father        CML  . Renal Disease Paternal Grandfather        one removed   . Colon  cancer Neg Hx   . Colon polyps Neg Hx   . Rectal cancer Neg Hx   . Stomach cancer Neg Hx    Social History   Tobacco Use  . Smoking status: Former Smoker    Packs/day: 2.00    Years: 20.00    Pack years: 40.00    Types: Cigarettes    Quit date: 06/19/2006    Years since quitting: 13.4  . Smokeless tobacco: Never Used  Substance Use Topics  . Alcohol use: Not Currently    Alcohol/week: 0.0 standard drinks    Comment: <2 monthly; rare  . Drug use: No   Current Outpatient Medications  Medication Sig Dispense Refill  . acetaminophen (TYLENOL) 650 MG CR tablet Take 650 mg by mouth every 8 (eight) hours as needed for pain (Rarely uses this medication).     . Acetylcarnitine HCl (ACETYL L-CARNITINE) 250 MG CAPS Take by mouth. 200 mg every other day    . albuterol (PROVENTIL HFA;VENTOLIN HFA) 108 (90 Base) MCG/ACT inhaler Inhale 2 puffs into the lungs every 6 (six) hours as needed for  wheezing or shortness of breath. 1 Inhaler 0  . ALPRAZolam (XANAX) 0.25 MG tablet TAKE 1 TO 1 & 1/2 TABLET BY MOUTH AT BEDTIME AS NEEDED 15 tablet 1  . atorvastatin (LIPITOR) 80 MG tablet TAKE 1 TABLET BY MOUTH EVERY DAY 90 tablet 1  . b complex vitamins tablet Take 1 tablet by mouth daily.      . Biotin 300 MCG TABS Take 600 mcg by mouth every other day.      . Calcium-Magnesium-Zinc 500-250-12.5 MG TABS Take 1 tablet by mouth every other day.      . Chromium Picolinate 200 MCG TABS Take 1 tablet by mouth every other day.      . clopidogrel (PLAVIX) 75 MG tablet Take 1 tablet (75 mg total) by mouth daily. Needs appt for further refills 90 tablet 0  . Coenzyme Q10 (COQ10) 200 MG CAPS Take 1 capsule by mouth daily.      Marland Kitchen CRANBERRY JUICE EXTRACT PO Take 500 mg by mouth daily.      Marland Kitchen EPINEPHrine 0.3 mg/0.3 mL IJ SOAJ injection INJECT 0.3 MLS INTO THE MUSCLE ONCE FOR 1 DOSE  0  . famotidine (PEPCID AC) 10 MG tablet Take 1 tablet (10 mg total) by mouth 2 (two) times daily. Take  1-2 tablets twice daily 180 tablet 3  . finasteride (PROSCAR) 5 MG tablet Take 2.5 mg by mouth daily.    . Flaxseed, Linseed, (FLAX SEED OIL) 1000 MG CAPS Take 1,200 mg by mouth daily.      . Garlic Oil 263 MG TABS Take 1 tablet by mouth daily.      . metoprolol succinate (TOPROL-XL) 25 MG 24 hr tablet Take 1 tablet (25 mg total) by mouth daily. 90 tablet 3  . nitroGLYCERIN (NITROSTAT) 0.4 MG SL tablet Place 1 tablet (0.4 mg total) under the tongue every 5 (five) minutes x 3 doses as needed for chest pain. 25 tablet 3  . omega-3 acid ethyl esters (LOVAZA) 1 g capsule Take 1 capsule (1 g total) by mouth daily. Needs appt for further refills 90 capsule 0  . Probiotic Product (ALIGN PO) Take 1 capsule by mouth daily.    . Rhodiola 300 MG CAPS Take 1 capsule by mouth daily.       No current facility-administered medications for this visit.   Allergies  Allergen Reactions  . Benzonatate  Unknown, patient states "it made  me sick"  . Chlorpromazine Hcl     unknown  . Codeine     REACTION: causes vomiting  . Doxycycline     "explosive diarrhea" -does not wish to take this anymore  . Hydrocodone     REACTION: causes vomiing and nausea  . Hydrocodone-Acetaminophen Nausea And Vomiting  . Lovaza [Omega-3-Acid Ethyl Esters] Other (See Comments)    Loose stools with two capsules  . Penicillins     REACTION: causes rash  . Prochlorperazine Edisylate     unknown   . Promethazine Hcl     unknown  . Sulfonamide Derivatives     unknown      Review of Systems: All systems reviewed and negative except where noted in HPI.   Lab Results  Component Value Date   WBC 12.5 (H) 06/26/2018   HGB 15.2 (H) 06/26/2018   HCT 46.0 06/26/2018   MCV 89.8 06/26/2018   PLT 356 06/26/2018    Lab Results  Component Value Date   CREATININE 0.65 06/26/2018   BUN 19 06/26/2018   NA 139 06/26/2018   K 3.7 06/26/2018   CL 105 06/26/2018   CO2 24 06/26/2018    Lab Results  Component Value Date   ALT 19 01/17/2018   AST 20 01/17/2018   ALKPHOS 79 01/17/2018   BILITOT 0.8 01/17/2018     Physical Exam: BP 124/70   Pulse (!) 59   Ht 5\' 2"  (1.575 m)   Wt 153 lb 12.8 oz (69.8 kg)   SpO2 96%   BMI 28.13 kg/m  Constitutional: Pleasant,well-developed, female in no acute distress. Neurological: Alert and oriented to person place and time. Skin: Skin is warm and dry. No rashes noted. Psychiatric: Normal mood and affect. Behavior is normal.   ASSESSMENT AND PLAN: 69 year old female here for reassessment of the following:  GERD - overall doing okay on Pepcid with variable dosing although she does have breakthrough with trigger foods, often at night.  She does not have a history of Barrett's esophagus and on Zantac she had no evidence of esophagitis on last EGD, I have reassured her that her risk for Barrett's and esophageal cancer is quite low in this light.  We discussed long-term treatment plan using H2  blockers, PPIs, versus other reflux interventions.  I did discuss long-term risks of benefits of chronic PPI use at length.  I do think PPI would control her symptoms better and perhaps allow her more dietary indiscretion however she is not willing to accept risks of PPIs right now and wishes to stay on Pepcid monotherapy.  She is currently on 10 mg twice a day and can increase to 20 mg twice daily.  If she has nocturnal symptoms recommend she take Gaviscon at night prior to bed.  If her symptoms interfere with her quality of life I do think that escalation to her PPI is reasonable if she wants to pursue that at some point time.  She can follow-up with me as needed for this.  I do not think she needs another EGD at this time, she had inquired about that.  She can follow-up with me yearly for this issue  History of colon polyps /antiplatelet use - due for surveillance colonoscopy, given her Plavix use we will need to seek approval to hold this for 5 days prior to exam.  I discussed risk and benefits of colonoscopy and anesthesia with the patient and her husband and they want to  proceed.  Further recommendations pending results of that exam  [place] Cellar, MD [facility]

## 2019-11-26 NOTE — Telephone Encounter (Signed)
Will forward to [facility] Triage so that they may reach out to the pt with an appt for pre op clearance. I will remove from the pre op call back pool.

## 2019-11-26 NOTE — Telephone Encounter (Signed)
[facility] Pre-operative Risk Assessment     Request for surgical clearance:     Endoscopy Procedure  What type of surgery is being performed?     Colonoscopy  When is this surgery scheduled?     12/17/19  What type of clearance is required ?   Pharmacy  Are there any medications that need to be held prior to surgery and how long? Plavix x 5 days  Practice name and name of physician performing surgery?      [facility]  What is your office phone and fax number?      Phone- [phone]  [phone]  Anesthesia type (None, local, MAC, general) ?       MAC

## 2019-11-26 NOTE — Telephone Encounter (Addendum)
   Primary Cardiologist: Dr. Aundra Dubin  Chart reviewed as part of pre-operative protocol coverage. Patient has not been seen since 10/2018 and this was a virtual visit. She has not been seen in person since 07/2017. She is due for 1 year follow-up. Therefore, she will require a follow-up visit in order to better assess preoperative cardiovascular risk.  Pre-op covering staff: - Please schedule appointment and call patient to inform them. If patient already had an upcoming appointment within acceptable timeframe, please add "pre-op clearance" to the appointment notes so provider is aware. - Please contact requesting surgeon's office via preferred method (i.e, phone, fax) to inform them of need for appointment prior to surgery.  I will also route this message to Dr. Aundra Dubin for for input on holding Plavix as requested below so that this information is available to the clearing provider at time of patient's appointment.   Darreld Mclean, PA-C  11/26/2019, 2:40 PM

## 2019-11-26 NOTE — Telephone Encounter (Signed)
Should be ok to hold Plavix x 5 days.

## 2019-12-01 ENCOUNTER — Other Ambulatory Visit: Payer: Self-pay

## 2019-12-01 ENCOUNTER — Ambulatory Visit (INDEPENDENT_AMBULATORY_CARE_PROVIDER_SITE_OTHER): Payer: Medicare HMO

## 2019-12-01 ENCOUNTER — Other Ambulatory Visit: Payer: Self-pay | Admitting: Physician Assistant

## 2019-12-01 DIAGNOSIS — Z23 Encounter for immunization: Secondary | ICD-10-CM

## 2019-12-01 DIAGNOSIS — F341 Dysthymic disorder: Secondary | ICD-10-CM

## 2019-12-01 MED ORDER — ZOSTER VAC RECOMB ADJUVANTED 50 MCG/0.5ML IM SUSR: 0.5000 mL | Freq: Once | INTRAMUSCULAR | 0 refills | Status: DC

## 2019-12-01 NOTE — Telephone Encounter (Signed)
Note faxed to GI

## 2019-12-01 NOTE — Progress Notes (Signed)
Emily [name], 69 year old female presents to the office today for her 2nd shingrix vaccine. Patient received Zoster Vaccine in the left deltoid and left the office in good condition.

## 2019-12-01 NOTE — Telephone Encounter (Signed)
Patient would like a 30 day supply of xanax, last filled 08/15/18 #15 with 0 refills LOV 10/14/19 NOV none

## 2019-12-01 NOTE — Progress Notes (Signed)
Reviewed.  Leeanne Rio, PA-C

## 2019-12-02 ENCOUNTER — Telehealth: Payer: Self-pay | Admitting: Physician Assistant

## 2019-12-02 NOTE — Telephone Encounter (Signed)
Left a message for patient to return my call. 

## 2019-12-02 NOTE — Telephone Encounter (Signed)
Informed patient that she can hold Plavix x 5 days per Dr. Aundra Dubin. Patient verbalized understanding.

## 2019-12-02 NOTE — Telephone Encounter (Signed)
Do not believe of seen her recently for this.  She can get OTC Nasacort as this should help with nasal congestion but should not have any impact on blood pressure as it has a local effect.

## 2019-12-02 NOTE — Telephone Encounter (Signed)
Yes with the Shingrix you can get flu-like symptoms after this including fever, chills, headache, fatigue. Tylenol for fever. Hydrate. Rest. Should dissipate in the next 24-48 hours.

## 2019-12-02 NOTE — Telephone Encounter (Signed)
Patient called back and said that she is running a fever of 100.5 and has a headache also - Is this normal after the shingles shot

## 2019-12-02 NOTE — Telephone Encounter (Signed)
Called patient and read her Cody's message - she understands and will call if symptoms do not improve after 48 hours.

## 2019-12-02 NOTE — Telephone Encounter (Signed)
Please advise 

## 2019-12-02 NOTE — Telephone Encounter (Signed)
Patient called stating that her sinus's are still bothering her - states that the saline spray is not helping.  Would like something called in that will not make her more congested and not raise her blood pressure - Patient uses [facility],

## 2019-12-03 NOTE — Telephone Encounter (Signed)
Patient called back 12/03/2019 - said that most of her symptoms have cleared up but she did want in noted in her chart that her blood pressure has been up since she took the shot.  She will call back if it's not back to normal by tomorrow.

## 2019-12-08 ENCOUNTER — Other Ambulatory Visit (HOSPITAL_COMMUNITY): Payer: Self-pay | Admitting: Cardiology

## 2019-12-14 ENCOUNTER — Emergency Department (HOSPITAL_COMMUNITY): Payer: Medicare HMO

## 2019-12-14 ENCOUNTER — Emergency Department (HOSPITAL_COMMUNITY)
Admission: EM | Admit: 2019-12-14 | Discharge: 2019-12-14 | Disposition: A | Discharge status: Home / Self Care | Payer: Medicare HMO | Attending: Emergency Medicine | Admitting: Emergency Medicine

## 2019-12-14 DIAGNOSIS — Z87891 Personal history of nicotine dependence: Secondary | ICD-10-CM | POA: Diagnosis not present

## 2019-12-14 DIAGNOSIS — Z79899 Other long term (current) drug therapy: Secondary | ICD-10-CM | POA: Insufficient documentation

## 2019-12-14 DIAGNOSIS — I2510 Atherosclerotic heart disease of native coronary artery without angina pectoris: Secondary | ICD-10-CM | POA: Diagnosis not present

## 2019-12-14 DIAGNOSIS — R079 Chest pain, unspecified: Secondary | ICD-10-CM | POA: Diagnosis not present

## 2019-12-14 DIAGNOSIS — I10 Essential (primary) hypertension: Secondary | ICD-10-CM | POA: Insufficient documentation

## 2019-12-14 LAB — BASIC METABOLIC PANEL
Anion gap: 12 (ref 5–15)
BUN: 11 mg/dL (ref 8–23)
CO2: 25 mmol/L (ref 22–32)
Calcium: 9.1 mg/dL (ref 8.9–10.3)
Chloride: 103 mmol/L (ref 98–111)
Creatinine, Ser: 0.68 mg/dL (ref 0.44–1.00)
GFR calc Af Amer: >60 mL/min (ref >60)
GFR calc non Af Amer: >60 mL/min (ref >60)
Glucose, Bld: 97 mg/dL (ref 70–99)
Potassium: 3.7 mmol/L (ref 3.5–5.1)
Sodium: 140 mmol/L (ref 135–145)

## 2019-12-14 LAB — CBC WITH DIFFERENTIAL/PLATELET
Abs Immature Granulocytes: 0.02 10*3/uL (ref 0.00–0.07)
Basophils Absolute: 0.1 10*3/uL (ref 0.0–0.1)
Basophils Relative: 1 %
Eosinophils Absolute: 0.2 10*3/uL (ref 0.0–0.5)
Eosinophils Relative: 2 %
HCT: 46.3 % — ABNORMAL HIGH (ref 36.0–46.0)
Hemoglobin: 15.1 g/dL — ABNORMAL HIGH (ref 12.0–15.0)
Immature Granulocytes: 0 %
Lymphocytes Relative: 29 %
Lymphs Abs: 2.6 10*3/uL (ref 0.7–4.0)
MCH: 29.4 pg (ref 26.0–34.0)
MCHC: 32.6 g/dL (ref 30.0–36.0)
MCV: 90.3 fL (ref 80.0–100.0)
Monocytes Absolute: 0.5 10*3/uL (ref 0.1–1.0)
Monocytes Relative: 6 %
Neutro Abs: 5.6 10*3/uL (ref 1.7–7.7)
Neutrophils Relative %: 62 %
Platelets: 248 10*3/uL (ref 150–400)
RBC: 5.13 MIL/uL — ABNORMAL HIGH (ref 3.87–5.11)
RDW: 13.2 % (ref 11.5–15.5)
WBC: 9 10*3/uL (ref 4.0–10.5)
nRBC: 0 % (ref 0.0–0.2)

## 2019-12-14 LAB — TROPONIN I (HIGH SENSITIVITY): Troponin I (High Sensitivity): 14 ng/L (ref <18)

## 2019-12-14 MED ORDER — METOPROLOL TARTRATE 25 MG PO TABS
12.5000 mg | ORAL_TABLET | Freq: Once | ORAL | Status: AC
  Administered 2019-12-14: 12.5 mg via ORAL
  Filled 2019-12-14: qty 1

## 2019-12-14 MED ORDER — METOPROLOL SUCCINATE ER 25 MG PO TB24
12.5000 mg | ORAL_TABLET | Freq: Every day | ORAL | 0 refills | Status: DC
Start: 2019-12-14 — End: 2019-12-17

## 2019-12-14 NOTE — Discharge Instructions (Signed)
Take your regularly scheduled blood pressure medications tomorrow.    I have prescribed an additional 12.5 mg of metoprolol to take daily with your regularly scheduled medications.  You will need to take half of a tablet of medication I prescribed to you   Please monitor blood pressures at home.  If your blood pressure is at 130/90 or below then do not take the extra half tablet of blood pressure medication.    Please make an appointment to follow-up with your regular doctor and/or your cardiologist within the next several days for reassessment and return to the ED for new or worsening symptoms.

## 2019-12-14 NOTE — ED Notes (Signed)
Patient verbalizes understanding of discharge instructions. Opportunity for questioning and answers were provided. Armband removed by staff, pt discharged from ED. Pt. ambulatory and discharged home.  

## 2019-12-14 NOTE — ED Triage Notes (Signed)
Pt received second shingles vaccine June 14.  BP has continued to rise and pt is concerned.  Pt got the first shinges vaccine in April and BP increased but not as high as it is now.  Pt was seen in her PCP office for same.

## 2019-12-14 NOTE — ED Provider Notes (Signed)
[facility] EMERGENCY DEPARTMENT Provider Note   CSN: [id number] Arrival date & time: 12/14/19  1628     History Chief Complaint  Patient presents with  . Hypertension    Emily [name] is a 69 y.o. female.  HPI    Pt is a 69 y/o female with a h/o anxiety,. CAD, depression, GERD, MI, heart murmur, HLD, HTN, perirectal abscess, shingles who presents to the ED today for eval of HTN that started a week ago. States that she got the shingles shot earlier this month and after the shot her BP has been elevated since. States that this happened after she got the first dose of it in April. At that time her BP stayed elevated for 1 week and returned to baseline after that. She is concerned that her Bps have not returned to normal.   She c/o a burning sensation and "fluttering" in the left chest intermittently. Sxs are not associated with exertion. This sx has been present for 2 weeks. She denies overt chest pain, back pain, jaw pain, shortness of breath, diaphoresis, nausea, vomiting, headache, dizziness, lightheadedness, vision changes.   She has appt Tuesday 12/16/2019 with her PCP.  State she is normally on metop 25 mg. She took it today at 1 PM.   Past Medical History:  Diagnosis Date  . Anxiety    no anxiety  . CAD (coronary artery disease)   . Cataract   . Chronic insomnia   . Colon polyp   . Depression   . GERD (gastroesophageal reflux disease)   . GERD with esophagitis   . Heart attack ([place]) 1-09  . Heart murmur   . Hyperlipemia   . Hypertension   . Perirectal abscess   . Shingles 08/31/2017   2nd dx with shingles, per pt    Patient Active Problem List   Diagnosis Date Noted  . Cystitis 09/28/2018  . Visit for preventive health examination 08/11/2015  . Aortic regurgitation 05/29/2013  . Systolic ejection murmur 19/75/8832  . Carotid stenosis 01/26/2011  . BACK PAIN, LEFT 01/19/2009  . ALOPECIA 01/28/2008  . Obesity 10/21/2007  . Hyperlipidemia  07/24/2007  . Coronary atherosclerosis 07/24/2007  . GERD 07/24/2007  . POLYP, COLON 03/26/2007  . ANXIETY DEPRESSION 03/26/2007  . INSOMNIA, CHRONIC 03/26/2007  . HYPERTENSION, MILD 03/26/2007    Past Surgical History:  Procedure Laterality Date  . CESAREAN SECTION     x 2  . COLONOSCOPY    . COLONOSCOPY W/ POLYPECTOMY  2007  . INCISION AND DRAINAGE ABSCESS ANAL    . POLYPECTOMY    . PTCA with stent to third obtuse marginal    . STOMACH SURGERY       OB History   No obstetric history on file.     Family History  Problem Relation Age of Onset  . Skin cancer Mother   . Allergy (severe) Father   . Heart disease Father   . Leukemia Father        CML  . Renal Disease Paternal Grandfather        one removed   . Colon cancer Neg Hx   . Colon polyps Neg Hx   . Rectal cancer Neg Hx   . Stomach cancer Neg Hx     Social History   Tobacco Use  . Smoking status: Former Smoker    Packs/day: 2.00    Years: 20.00    Pack years: 40.00    Types: Cigarettes    Quit date:  06/19/2006    Years since quitting: 13.4  . Smokeless tobacco: Never Used  Vaping Use  . Vaping Use: Never used  Substance Use Topics  . Alcohol use: Not Currently    Alcohol/week: 0.0 standard drinks    Comment: <2 monthly; rare  . Drug use: No    Home Medications Prior to Admission medications   Medication Sig Start Date End Date Taking? Authorizing Provider  acetaminophen (TYLENOL) 650 MG CR tablet Take 650 mg by mouth every 8 (eight) hours as needed for pain (Rarely uses this medication).     [provider]  Acetylcarnitine HCl (ACETYL L-CARNITINE) 250 MG CAPS Take by mouth. 200 mg every other day    [provider]  albuterol (PROVENTIL HFA;VENTOLIN HFA) 108 (90 Base) MCG/ACT inhaler Inhale 2 puffs into the lungs every 6 (six) hours as needed for wheezing or shortness of breath. 06/16/16   Nche, Charlene Brooke, NP  ALPRAZolam (XANAX) 0.25 MG tablet TAKE 1 TO 1 & 1/2 TABLET BY  MOUTH AT BEDTIME AS NEEDED **NO SUB GREENSTONE BRAND** 12/01/19   Brunetta Jeans, PA-C  atorvastatin (LIPITOR) 80 MG tablet TAKE 1 TABLET BY MOUTH EVERY DAY 08/22/19   Larey Dresser, MD  b complex vitamins tablet Take 1 tablet by mouth daily.      [provider]  Biotin 300 MCG TABS Take 600 mcg by mouth every other day.      [provider]  Calcium-Magnesium-Zinc 500-250-12.5 MG TABS Take 1 tablet by mouth every other day.      [provider]  Chromium Picolinate 200 MCG TABS Take 1 tablet by mouth every other day.      [provider]  clopidogrel (PLAVIX) 75 MG tablet Take 1 tablet (75 mg total) by mouth daily. Needs appt for further refills 10/29/19   Larey Dresser, MD  Coenzyme Q10 (COQ10) 200 MG CAPS Take 1 capsule by mouth daily.      [provider]  CRANBERRY JUICE EXTRACT PO Take 500 mg by mouth daily.      [provider]  EPINEPHrine 0.3 mg/0.3 mL IJ SOAJ injection INJECT 0.3 MLS INTO THE MUSCLE ONCE FOR 1 DOSE 09/03/17   [provider]  famotidine (PEPCID AC) 10 MG tablet Take 1 tablet (10 mg total) by mouth 2 (two) times daily. Take  1-2 tablets twice daily 03/19/18   Armbruster, Carlota Raspberry, MD  finasteride (PROSCAR) 5 MG tablet Take 2.5 mg by mouth daily. 06/03/18   [provider]  Flaxseed, Linseed, (FLAX SEED OIL) 1000 MG CAPS Take 1,200 mg by mouth daily.      [provider]  Garlic Oil 160 MG TABS Take 1 tablet by mouth daily.      [provider]  metoprolol succinate (TOPROL-XL) 25 MG 24 hr tablet Take 1 tablet (25 mg total) by mouth daily. 05/27/19   Larey Dresser, MD  metoprolol succinate (TOPROL-XL) 25 MG 24 hr tablet Take 0.5 tablets (12.5 mg total) by mouth daily. 12/14/19 01/13/20  Allon Costlow S, PA-C  nitroGLYCERIN (NITROSTAT) 0.4 MG SL tablet Place 1 tablet (0.4 mg total) under the tongue every 5 (five) minutes x 3 doses as needed for chest pain. 12/14/15   Larey Dresser, MD  omega-3 acid ethyl esters (LOVAZA) 1 g capsule Take 1 capsule (1 g total) by mouth daily. Needs appt for further refills 10/29/19   Larey Dresser, MD  Probiotic Product (ALIGN PO) Take  1 capsule by mouth daily.    [provider]  Rhodiola 300 MG CAPS Take 1 capsule by mouth daily.      [provider]  Sodium Sulfate-Mag Sulfate-KCl (SUTAB) 8326209346 MG TABS Take 1 kit by mouth as directed. 11/26/19   Armbruster, Carlota Raspberry, MD    Allergies    Benzonatate, Chlorpromazine hcl, Codeine, Doxycycline, Hydrocodone, Hydrocodone-acetaminophen, Lovaza [omega-3-acid ethyl esters], Penicillins, Prochlorperazine edisylate, Promethazine hcl, and Sulfonamide derivatives  Review of Systems   Review of Systems  Constitutional: Negative for diaphoresis and fever.  HENT: Negative for ear pain and sore throat.   Eyes: Negative for visual disturbance.  Respiratory: Negative for shortness of breath.   Cardiovascular: Negative for chest pain and palpitations.       Chest burning  Gastrointestinal: Negative for abdominal pain, constipation, diarrhea, nausea and vomiting.  Genitourinary: Negative for decreased urine volume and flank pain.  Musculoskeletal: Negative for back pain.  Skin: Negative for rash.  Neurological: Negative for dizziness, speech difficulty, weakness, light-headedness, numbness and headaches.  All other systems reviewed and are negative.   Physical Exam Updated Vital Signs BP (!) 163/71   Pulse 68   Temp 98.3 F (36.8 C) (Oral)   Resp 14   Ht '5\' 1"'  (1.549 m)   Wt 69.4 kg   SpO2 96%   BMI 28.91 kg/m   Physical Exam Vitals and nursing note reviewed.  Constitutional:      General: She is not in acute distress.    Appearance: She is well-developed.  HENT:     Head: Normocephalic and atraumatic.  Eyes:     Conjunctiva/sclera: Conjunctivae normal.  Cardiovascular:     Rate and Rhythm: Normal rate and regular rhythm.     Pulses: Normal pulses.      Heart sounds: Murmur heard.   Pulmonary:     Effort: Pulmonary effort is normal. No respiratory distress.     Breath sounds: Normal breath sounds.  Abdominal:     General: Bowel sounds are normal.     Palpations: Abdomen is soft.     Tenderness: There is no abdominal tenderness.  Musculoskeletal:     Cervical back: Neck supple.  Skin:    General: Skin is warm and dry.  Neurological:     Mental Status: She is alert.     Comments: Mental Status:  Alert, thought content appropriate, able to give a coherent history. Speech fluent without evidence of aphasia. Able to follow 2 step commands without difficulty.  Cranial Nerves II-XII intact Motor:  Normal tone. 5/5 strength of BUE and BLE major muscle groups including strong and equal grip strength and dorsiflexion/plantar flexion Sensory: light touch normal in all extremities. CV: 2+ DP pulses      ED Results / Procedures / Treatments   Labs (all labs ordered are listed, but only abnormal results are displayed) Labs Reviewed  CBC WITH DIFFERENTIAL/PLATELET - Abnormal; Notable for the following components:      Result Value   RBC 5.13 (*)    Hemoglobin 15.1 (*)    HCT 46.3 (*)    All other components within normal limits  BASIC METABOLIC PANEL  TROPONIN I (HIGH SENSITIVITY)    EKG EKG Interpretation  Date/Time:  Sunday December 14 2019 17:50:26 EDT Ventricular Rate:  70 PR Interval:  160 QRS Duration: 80 QT Interval:  386 QTC Calculation: 416 R Axis:   55 Text Interpretation: Normal sinus rhythm with sinus arrhythmia Cannot rule out Inferior infarct , age  undetermined Abnormal ECG Confirmed by Pattricia Boss [phone]) on 12/14/2019 8:45:04 PM - EKG unchanged from prior  Radiology DG Chest Portable 1 View  Result Date: 12/14/2019 CLINICAL DATA:  Chest pain EXAM: PORTABLE CHEST 1 VIEW COMPARISON:  None. FINDINGS: The heart size and mediastinal contours are within normal limits. Both lungs are clear. The visualized skeletal  structures are unremarkable. IMPRESSION: No active disease. Electronically Signed   By: Ulyses Jarred M.D.   On: 12/14/2019 20:28    Procedures Procedures (including critical care time)  Medications Ordered in ED Medications  metoprolol tartrate (LOPRESSOR) tablet 12.5 mg (12.5 mg Oral Given 12/14/19 2127)    ED Course  I have reviewed the triage vital signs and the nursing notes.  Pertinent labs & imaging results that were available during my care of the patient were reviewed by me and considered in my medical decision making (see chart for details).    MDM Rules/Calculators/A&P                          69 year old female with 1 week history of hypertension.  Has a burning sensation in her chest but denies any overt pain, shortness of breath, diaphoresis, nausea.  BP elevated on arrival to the ED 791 systolic however improved to 187 on recheck.  Her exam is reassuring.  We will get labs, troponin EKG.  Reviewed/interpreted labs CBC nonacute BMP nonacute Trop neg  EKG Normal sinus rhythm with sinus arrhythmia Cannot rule out Inferior infarct , age undetermined Abnormal ECG - unchanged  CXR : No active disease.  Patient without any evidence of hypertensive emergency at this time.  She was given 12.5 mg metoprolol in the ED to help with her pressures.  They have improved since she was initially seen.  Will add 12.5 mg metoprolol to her home dose of 25 mg.  Have advised that she follow-up with her cardiologist and her regular doctor tomorrow to reassess blood pressure.  Advised on return precautions.  She voiced understanding plan reasons to return.  All questions answered.  Patient stable for discharge.  Final Clinical Impression(s) / ED Diagnoses Final diagnoses:  Hypertension, unspecified type    Rx / DC Orders ED Discharge Orders         Ordered    metoprolol succinate (TOPROL-XL) 25 MG 24 hr tablet  Daily     Discontinue  Reprint     12/14/19 2147           Rodney Booze, PA-C 12/14/19 2149    Pattricia Boss, MD 12/18/19 1550

## 2019-12-14 NOTE — ED Triage Notes (Signed)
Pt took BP med at 2pm today.

## 2019-12-15 ENCOUNTER — Telehealth (HOSPITAL_COMMUNITY): Payer: Self-pay | Admitting: *Deleted

## 2019-12-15 NOTE — Telephone Encounter (Signed)
Pt called to sch appt w/Dr Aundra Dubin as she was in ER over weekend with htn, she states her BP was in the 180s so she went to ER and it was 208/110 there, work-up and labs there was normal, pt was asymptomatic and was given medication, BP lowered and she was d/c'd home. She states at 3 pm she checked it again and it was back up to 180s and she called EMS, still asymptomatic, pt decided not to back to ER. She is on Metoprolol XL 25 mg daily. She is sch to see her PCP tomorrow for f/u. She denies HA/dizziness/blurred vision/dyspnea. Appt sch for 7/16 with Dr Aundra Dubin, she will f/u with pcp

## 2019-12-16 ENCOUNTER — Encounter: Payer: Self-pay | Admitting: Physician Assistant

## 2019-12-16 ENCOUNTER — Ambulatory Visit (INDEPENDENT_AMBULATORY_CARE_PROVIDER_SITE_OTHER): Payer: Medicare HMO | Admitting: Physician Assistant

## 2019-12-16 ENCOUNTER — Other Ambulatory Visit: Payer: Self-pay

## 2019-12-16 VITALS — BP 142/78 | HR 74 | Temp 97.7°F | Resp 18 | Ht 61.0 in | Wt 151.8 lb

## 2019-12-16 DIAGNOSIS — I10 Essential (primary) hypertension: Secondary | ICD-10-CM

## 2019-12-16 LAB — TSH: TSH: 3.8 u[IU]/mL (ref 0.35–4.50)

## 2019-12-16 NOTE — Progress Notes (Signed)
Patient presents to clinic today c/o elevation in BP above her baseline since receiving her 2nd Shingrix vaccine a few weeks ago. Has been checking BP several times per day and recording. Notes elevation are worse in the middle of the night. Notes keeping a low-salt diet and keeping hydrated. Denies increased stressors or feelings of anxiety. On 6/27 she noted her BP being quite elevated. Other than being anxious about this she stated she felt fine but went to the ER to get checked out. ER evaluation revealed BP up at 633 systolic on arrival to ER, quickly coming down to 180. Labs including CBC, BMP and troponin negative for acute findings. EKG without concerning findings. BP continued to improve but still elevated. As such patient was given 12.5 mg toprol in the ER with improvement in BP. Was discharged to take 1.5 tablets of her home metoprolol (37.5 mg) daily. Notes BP have still remained somewhat elevated with her increased dose of medication. Has only taken 1 dose of the extra 12.5 mg. Patient denies chest pain, palpitations, lightheadedness, dizziness, vision changes or frequent headaches.    Past Medical History:  Diagnosis Date   Anxiety    no anxiety   CAD (coronary artery disease)    Cataract    Chronic insomnia    Colon polyp    Depression    GERD (gastroesophageal reflux disease)    GERD with esophagitis    Heart attack ([place]) 1-09   Heart murmur    Hyperlipemia    Hypertension    Perirectal abscess    Shingles 08/31/2017   2nd dx with shingles, per pt    Current Outpatient Medications on File Prior to Visit  Medication Sig Dispense Refill   acetaminophen (TYLENOL) 650 MG CR tablet Take 650 mg by mouth every 8 (eight) hours as needed for pain (Rarely uses this medication).      Acetylcarnitine HCl (ACETYL L-CARNITINE) 250 MG CAPS Take by mouth. 200 mg every other day     albuterol (PROVENTIL HFA;VENTOLIN HFA) 108 (90 Base) MCG/ACT inhaler Inhale 2 puffs  into the lungs every 6 (six) hours as needed for wheezing or shortness of breath. 1 Inhaler 0   ALPRAZolam (XANAX) 0.25 MG tablet TAKE 1 TO 1 & 1/2 TABLET BY MOUTH AT BEDTIME AS NEEDED **NO SUB GREENSTONE BRAND** 30 tablet 1   atorvastatin (LIPITOR) 80 MG tablet TAKE 1 TABLET BY MOUTH EVERY DAY 90 tablet 1   b complex vitamins tablet Take 1 tablet by mouth daily.       Biotin 300 MCG TABS Take 600 mcg by mouth every other day.       Calcium-Magnesium-Zinc 500-250-12.5 MG TABS Take 1 tablet by mouth every other day.       Chromium Picolinate 200 MCG TABS Take 1 tablet by mouth every other day.       clopidogrel (PLAVIX) 75 MG tablet Take 1 tablet (75 mg total) by mouth daily. Needs appt for further refills 90 tablet 0   Coenzyme Q10 (COQ10) 200 MG CAPS Take 1 capsule by mouth daily.       CRANBERRY JUICE EXTRACT PO Take 500 mg by mouth daily.       EPINEPHrine 0.3 mg/0.3 mL IJ SOAJ injection INJECT 0.3 MLS INTO THE MUSCLE ONCE FOR 1 DOSE  0   famotidine (PEPCID AC) 10 MG tablet Take 1 tablet (10 mg total) by mouth 2 (two) times daily. Take  1-2 tablets twice daily 180 tablet 3  finasteride (PROSCAR) 5 MG tablet Take 2.5 mg by mouth daily.    °• Flaxseed, Linseed, (FLAX SEED OIL) 1000 MG CAPS Take 1,200 mg by mouth daily.      °• Garlic Oil 500 MG TABS Take 1 tablet by mouth daily.      °• metoprolol succinate (TOPROL-XL) 25 MG 24 hr tablet Take 1 tablet (25 mg total) by mouth daily. 90 tablet 3  °• metoprolol succinate (TOPROL-XL) 25 MG 24 hr tablet Take 0.5 tablets (12.5 mg total) by mouth daily. 15 tablet 0  °• nitroGLYCERIN (NITROSTAT) 0.4 MG SL tablet Place 1 tablet (0.4 mg total) under the tongue every 5 (five) minutes x 3 doses as needed for chest pain. 25 tablet 3  °• omega-3 acid ethyl esters (LOVAZA) 1 g capsule Take 1 capsule (1 g total) by mouth daily. Needs appt for further refills 90 capsule 0  °• Probiotic Product (ALIGN PO) Take 1 capsule by mouth daily.    °• Rhodiola 300 MG  CAPS Take 1 capsule by mouth daily.      °• Sodium Sulfate-Mag Sulfate-KCl (SUTAB) 1479-225-188 MG TABS Take 1 kit by mouth as directed. 24 tablet 0  ° °No current facility-administered medications on file prior to visit.  ° ° °Allergies  °Allergen Reactions  °• Benzonatate   °  Unknown, patient states "it made me sick"  °• Chlorpromazine Hcl   °  unknown  °• Codeine   °  REACTION: causes vomiting  °• Doxycycline   °  "explosive diarrhea" -does not wish to take this anymore  °• Hydrocodone   °  REACTION: causes vomiing and nausea  °• Hydrocodone-Acetaminophen Nausea And Vomiting  °• Lovaza [Omega-3-Acid Ethyl Esters] Other (See Comments)  °  Loose stools with two capsules  °• Penicillins   °  REACTION: causes rash  °• Prochlorperazine Edisylate   °  unknown °  °• Promethazine Hcl   °  unknown  °• Sulfonamide Derivatives   °  unknown °  ° ° °Family History  °Problem Relation Age of Onset  °• Skin cancer Mother   °• Allergy (severe) Father   °• Heart disease Father   °• Leukemia Father   °     CML  °• Renal Disease Paternal Grandfather   °     one removed   °• Colon cancer Neg Hx   °• Colon polyps Neg Hx   °• Rectal cancer Neg Hx   °• Stomach cancer Neg Hx   ° ° °Social History  ° °Socioeconomic History  °• Marital status: Married  °  Spouse name: Not on file  °• Number of children: 2  °• Years of education: Not on file  °• Highest education level: Not on file  °Occupational History  °• Occupation: housewife/ student  °Tobacco Use  °• Smoking status: Former Smoker  °  Packs/day: 2.00  °  Years: 20.00  °  Pack years: 40.00  °  Types: Cigarettes  °  Quit date: 06/19/2006  °  Years since quitting: 13.5  °• Smokeless tobacco: Never Used  °Vaping Use  °• Vaping Use: Never used  °Substance and Sexual Activity  °• Alcohol use: Not Currently  °  Alcohol/week: 0.0 standard drinks  °  Comment: <2 monthly; rare  °• Drug use: No  °• Sexual activity: Not on file  °Other Topics Concern  °• Not on file  °Social History Narrative  °  Resides in [place] with her husband.   °   Husband works in [place] and comes home two to three times a year   2 children who go to [place]   Just got accepted to [employer]   Exercise- NO   Social Determinants of Health   Financial Resource Strain:    Difficulty of Paying Living Expenses:   Food Insecurity:    Worried About Charity fundraiser in the Last Year:    Arboriculturist in the Last Year:   Transportation Needs:    Film/video editor (Medical):    Lack of Transportation (Non-Medical):   Physical Activity:    Days of Exercise per Week:    Minutes of Exercise per Session:   Stress:    Feeling of Stress :   Social Connections:    Frequency of Communication with Friends and Family:    Frequency of Social Gatherings with Friends and Family:    Attends Religious Services:    Active Member of Clubs or Organizations:    Attends Archivist Meetings:    Marital Status:     Review of Systems - See HPI.  All other ROS are negative.  BP (!) 142/78    Pulse 74    Temp 97.7 F (36.5 C) (Temporal)    Resp 18    Ht 5' 1" (1.549 m)    Wt 151 lb 12.8 oz (68.9 kg)    SpO2 95%    BMI 28.68 kg/m   Physical Exam Vitals reviewed.  Constitutional:      Appearance: Normal appearance.  HENT:     Head: Normocephalic and atraumatic.  Cardiovascular:     Rate and Rhythm: Normal rate and regular rhythm.     Pulses: Normal pulses.     Heart sounds: Normal heart sounds.  Pulmonary:     Effort: Pulmonary effort is normal.     Breath sounds: Normal breath sounds.  Musculoskeletal:     Cervical back: Neck supple.  Neurological:     Mental Status: She is alert.  Psychiatric:        Mood and Affect: Mood normal.     Recent Results (from the past 2160 hour(s))  HM MAMMOGRAPHY     Status: None   Collection Time: 09/25/19 12:00 AM  Result Value Ref Range   HM Mammogram 0-4 Bi-Rad 0-4 Bi-Rad, Self Reported Normal    Comment: Bi-Rads 1 Negative  CBC with Differential      Status: Abnormal   Collection Time: 12/14/19  8:20 PM  Result Value Ref Range   WBC 9.0 4.0 - 10.5 K/uL   RBC 5.13 (H) 3.87 - 5.11 MIL/uL   Hemoglobin 15.1 (H) 12.0 - 15.0 g/dL   HCT 46.3 (H) 36 - 46 %   MCV 90.3 80.0 - 100.0 fL   MCH 29.4 26.0 - 34.0 pg   MCHC 32.6 30.0 - 36.0 g/dL   RDW 13.2 11.5 - 15.5 %   Platelets 248 150 - 400 K/uL   nRBC 0.0 0.0 - 0.2 %   Neutrophils Relative % 62 %   Neutro Abs 5.6 1.7 - 7.7 K/uL   Lymphocytes Relative 29 %   Lymphs Abs 2.6 0.7 - 4.0 K/uL   Monocytes Relative 6 %   Monocytes Absolute 0.5 0 - 1 K/uL   Eosinophils Relative 2 %   Eosinophils Absolute 0.2 0 - 0 K/uL   Basophils Relative 1 %   Basophils Absolute 0.1 0 - 0 K/uL   Immature Granulocytes 0 %  Abs Immature Granulocytes 0.02 0.00 - 0.07 K/uL  °  Comment: Performed at [facility], [address] [place], [place] [address]  °Basic metabolic panel     Status: None  ° Collection Time: 12/14/19  8:20 PM  °Result Value Ref Range  ° Sodium 140 135 - 145 mmol/L  ° Potassium 3.7 3.5 - 5.1 mmol/L  ° Chloride 103 98 - 111 mmol/L  ° CO2 25 22 - 32 mmol/L  ° Glucose, Bld 97 70 - 99 mg/dL  °  Comment: Glucose reference range applies only to samples taken after fasting for at least 8 hours.  ° BUN 11 8 - 23 mg/dL  ° Creatinine, Ser 0.68 0.44 - 1.00 mg/dL  ° Calcium 9.1 8.9 - 10.3 mg/dL  ° GFR calc non Af Amer >60 >60 mL/min  ° GFR calc Af Amer >60 >60 mL/min  ° Anion gap 12 5 - 15  °  Comment: Performed at [facility], [address] [place], [place] [address]  °Troponin I (High Sensitivity)     Status: None  ° Collection Time: 12/14/19  8:20 PM  °Result Value Ref Range  ° Troponin I (High Sensitivity) 14 <18 ng/L  °  Comment: (NOTE) °Elevated high sensitivity troponin I (hsTnI) values and significant  °changes across serial measurements may suggest ACS but many other  °chronic and acute conditions are known to elevate hsTnI results.  °Refer to the "Links" section for chest pain algorithms  and additional  °guidance. °Performed at  Hospital Lab, [address] [place], [place] °[address] °  ° ° °Assessment/Plan: °1. Essential hypertension °BP only slightly elevated today at 142/78, relatively unchanged on recheck. Of note she has her home wrist cuff which was used to check BP after manual check with result at 170s systolic. Question validity of her home cuff. Recommend a home arm cuff and not wrist cuff. She will get one of these. Giving improvement in BP will keep her at 1.5 tablets of Metoprolol every day for now, recording daily BP. She is not to keep checking BP consecutively in a short amount of time as she has been -- this only increased BP and anxiety which further increases BP. Will check TSH today. Continue DASH diet. Close follow-up scheduled. She does have follow-up with her Cardiologist scheduled for next week. She has been instructed to keep this appointment.  °- TSH ° °This visit occurred during the SARS-CoV-2 public health emergency.  Safety protocols were in place, including screening questions prior to the visit, additional usage of staff PPE, and extensive cleaning of exam room while observing appropriate contact time as indicated for disinfecting solutions.  ° ° ° °William Cody Martin, PA-C °

## 2019-12-16 NOTE — Patient Instructions (Addendum)
Please get an arm cuff to check home BP measurements. These are much more reliable. Make sure to check in a seated position with feet flat on the floor, having rested for 5-10 minutes before checking.   Keep a low-salt diet -- see below.  Continue 1.5 Metropolol once daily.  Call me at end of week with BP readings so we can recheck and adjust medications further.  We will see if we can get you in with Dr. Claris Gladden office sooner.   Hang in there!   DASH Eating Plan DASH stands for "Dietary Approaches to Stop Hypertension." The DASH eating plan is a healthy eating plan that has been shown to reduce high blood pressure (hypertension). It may also reduce your risk for type 2 diabetes, heart disease, and stroke. The DASH eating plan may also help with weight loss. What are tips for following this plan?  General guidelines  Avoid eating more than 2,300 mg (milligrams) of salt (sodium) a day. If you have hypertension, you may need to reduce your sodium intake to 1,500 mg a day.  Limit alcohol intake to no more than 1 drink a day for nonpregnant women and 2 drinks a day for men. One drink equals 12 oz of beer, 5 oz of wine, or 1 oz of hard liquor.  Work with your health care provider to maintain a healthy body weight or to lose weight. Ask what an ideal weight is for you.  Get at least 30 minutes of exercise that causes your heart to beat faster (aerobic exercise) most days of the week. Activities may include walking, swimming, or biking.  Work with your health care provider or diet and nutrition specialist (dietitian) to adjust your eating plan to your individual calorie needs. Reading food labels   Check food labels for the amount of sodium per serving. Choose foods with less than 5 percent of the Daily Value of sodium. Generally, foods with less than 300 mg of sodium per serving fit into this eating plan.  To find whole grains, look for the word "whole" as the first word in the  ingredient list. Shopping  Buy products labeled as "low-sodium" or "no salt added."  Buy fresh foods. Avoid canned foods and premade or frozen meals. Cooking  Avoid adding salt when cooking. Use salt-free seasonings or herbs instead of table salt or sea salt. Check with your health care provider or pharmacist before using salt substitutes.  Do not fry foods. Cook foods using healthy methods such as baking, boiling, grilling, and broiling instead.  Cook with heart-healthy oils, such as olive, canola, soybean, or sunflower oil. Meal planning  Eat a balanced diet that includes: ? 5 or more servings of fruits and vegetables each day. At each meal, try to fill half of your plate with fruits and vegetables. ? Up to 6-8 servings of whole grains each day. ? Less than 6 oz of lean meat, poultry, or fish each day. A 3-oz serving of meat is about the same size as a deck of cards. One egg equals 1 oz. ? 2 servings of low-fat dairy each day. ? A serving of nuts, seeds, or beans 5 times each week. ? Heart-healthy fats. Healthy fats called Omega-3 fatty acids are found in foods such as flaxseeds and coldwater fish, like sardines, salmon, and mackerel.  Limit how much you eat of the following: ? Canned or prepackaged foods. ? Food that is high in trans fat, such as fried foods. ? Food that is  high in saturated fat, such as fatty meat. ? Sweets, desserts, sugary drinks, and other foods with added sugar. ? Full-fat dairy products.  Do not salt foods before eating.  Try to eat at least 2 vegetarian meals each week.  Eat more home-cooked food and less restaurant, buffet, and fast food.  When eating at a restaurant, ask that your food be prepared with less salt or no salt, if possible. What foods are recommended? The items listed may not be a complete list. Talk with your dietitian about what dietary choices are best for you. Grains Whole-grain or whole-wheat bread. Whole-grain or whole-wheat  pasta. Brown rice. [place]. Bulgur. Whole-grain and low-sodium cereals. Pita bread. Low-fat, low-sodium crackers. Whole-wheat flour tortillas. Vegetables Fresh or frozen vegetables (raw, steamed, roasted, or grilled). Low-sodium or reduced-sodium tomato and vegetable juice. Low-sodium or reduced-sodium tomato sauce and tomato paste. Low-sodium or reduced-sodium canned vegetables. Fruits All fresh, dried, or frozen fruit. Canned fruit in natural juice (without added sugar). Meat and other protein foods Skinless chicken or [place]. Ground chicken or [place]. Pork with fat trimmed off. Fish and seafood. Egg whites. Dried beans, peas, or lentils. Unsalted nuts, nut butters, and seeds. Unsalted canned beans. Lean cuts of beef with fat trimmed off. Low-sodium, lean deli meat. Dairy Low-fat (1%) or fat-free (skim) milk. Fat-free, low-fat, or reduced-fat cheeses. Nonfat, low-sodium ricotta or cottage cheese. Low-fat or nonfat yogurt. Low-fat, low-sodium cheese. Fats and oils Soft margarine without trans fats. Vegetable oil. Low-fat, reduced-fat, or light mayonnaise and salad dressings (reduced-sodium). Canola, safflower, olive, soybean, and sunflower oils. Avocado. Seasoning and other foods Herbs. Spices. Seasoning mixes without salt. Unsalted popcorn and pretzels. Fat-free sweets. What foods are not recommended? The items listed may not be a complete list. Talk with your dietitian about what dietary choices are best for you. Grains Baked goods made with fat, such as croissants, muffins, or some breads. Dry pasta or rice meal packs. Vegetables Creamed or fried vegetables. Vegetables in a cheese sauce. Regular canned vegetables (not low-sodium or reduced-sodium). Regular canned tomato sauce and paste (not low-sodium or reduced-sodium). Regular tomato and vegetable juice (not low-sodium or reduced-sodium). Angie Fava. Olives. Fruits Canned fruit in a light or heavy syrup. Fried fruit. Fruit in cream or  butter sauce. Meat and other protein foods Fatty cuts of meat. Ribs. Fried meat. Berniece Salines. Sausage. Bologna and other processed lunch meats. Salami. Fatback. Hotdogs. Bratwurst. Salted nuts and seeds. Canned beans with added salt. Canned or smoked fish. Whole eggs or egg yolks. Chicken or [place] with skin. Dairy Whole or 2% milk, cream, and half-and-half. Whole or full-fat cream cheese. Whole-fat or sweetened yogurt. Full-fat cheese. Nondairy creamers. Whipped toppings. Processed cheese and cheese spreads. Fats and oils Butter. Stick margarine. Lard. Shortening. Ghee. Bacon fat. Tropical oils, such as coconut, palm kernel, or palm oil. Seasoning and other foods Salted popcorn and pretzels. Onion salt, garlic salt, seasoned salt, table salt, and sea salt. Worcestershire sauce. Tartar sauce. Barbecue sauce. Teriyaki sauce. Soy sauce, including reduced-sodium. Steak sauce. Canned and packaged gravies. Fish sauce. Oyster sauce. Cocktail sauce. Horseradish that you find on the shelf. Ketchup. Mustard. Meat flavorings and tenderizers. Bouillon cubes. Hot sauce and Tabasco sauce. Premade or packaged marinades. Premade or packaged taco seasonings. Relishes. Regular salad dressings. Where to find more information:  National Heart, Lung, and [place]: https://wilson-eaton.com/  American Heart Association: www.heart.org Summary  The DASH eating plan is a healthy eating plan that has been shown to reduce high blood pressure (hypertension). It  may also reduce your risk for type 2 diabetes, heart disease, and stroke.  With the DASH eating plan, you should limit salt (sodium) intake to 2,300 mg a day. If you have hypertension, you may need to reduce your sodium intake to 1,500 mg a day.  When on the DASH eating plan, aim to eat more fresh fruits and vegetables, whole grains, lean proteins, low-fat dairy, and heart-healthy fats.  Work with your health care provider or diet and nutrition specialist (dietitian) to  adjust your eating plan to your individual calorie needs. This information is not intended to replace advice given to you by your health care provider. Make sure you discuss any questions you have with your health care provider. Document Revised: 05/18/2017 Document Reviewed: 05/29/2016 Elsevier Patient Education  2020 [employer].

## 2019-12-17 ENCOUNTER — Encounter: Payer: Medicare HMO | Admitting: Gastroenterology

## 2019-12-17 ENCOUNTER — Other Ambulatory Visit: Payer: Self-pay | Admitting: Physician Assistant

## 2019-12-17 ENCOUNTER — Telehealth: Payer: Self-pay | Admitting: Physician Assistant

## 2019-12-17 ENCOUNTER — Other Ambulatory Visit: Payer: Self-pay

## 2019-12-17 MED ORDER — METOPROLOL SUCCINATE ER 50 MG PO TB24
50.0000 mg | ORAL_TABLET | Freq: Every day | ORAL | 3 refills | Status: DC
Start: 2019-12-17 — End: 2020-12-29

## 2019-12-17 NOTE — Telephone Encounter (Signed)
Patient's husband called and said that her blood pressure 152/89 - at 11:00 am today - He wants to know how often she can take the additional blood pressure medication.

## 2019-12-17 NOTE — Telephone Encounter (Signed)
As discussed yesterday -- she is to take the 1.5 tablets of the Metropolol XL daily, not just as needed. We need to watch BP over the next few days on this regimen, checking with her cuff to get more data. It will take a few days of taking the new dose consistently to make an overall change. If no major improvement we will either increase further or add on another agent for BP.

## 2019-12-17 NOTE — Telephone Encounter (Signed)
I would recommend that we go ahead and increase to 2 tablets (50 mg) daily of the Toprol XL. Can send in new script for 50 mg if she prefers. Also make sure that she has gotten a new cuff and is checking with that.

## 2019-12-17 NOTE — Telephone Encounter (Signed)
Spoke with the patient's husband he stated that she is taking her blood pressure medication as discussed in her visit yesterday. He stated last night her blood pressure reached 190/59. He would like to know would she be able to take an additional dose of the metoprolol 1.5 tablets and how often would she be able to do so. Please advise.

## 2019-12-17 NOTE — Telephone Encounter (Signed)
Spoke with the patient's husband and he verbalized understanding the instructions given about her medication being increased. Agreed to have new script sent in. She is using a blood pressure cuff for her arm. New script has been sent in.

## 2019-12-18 ENCOUNTER — Telehealth (HOSPITAL_COMMUNITY): Payer: Self-pay

## 2019-12-18 ENCOUNTER — Other Ambulatory Visit: Payer: Self-pay

## 2019-12-18 ENCOUNTER — Telehealth: Payer: Self-pay | Admitting: Physician Assistant

## 2019-12-18 MED ORDER — LOSARTAN POTASSIUM 50 MG PO TABS
50.0000 mg | ORAL_TABLET | Freq: Every day | ORAL | 3 refills | Status: DC
Start: 2019-12-18 — End: 2020-01-02

## 2019-12-18 NOTE — Telephone Encounter (Signed)
Have her take the metoprolol, no more than as directed, especially since I have no idea what pulse has been running with these increased doses, start losartan 50 mg once daily in addition to metoprolol. Ok to send in [place]. Again it is going to take a few days to get medicine fully in her system to note a huge difference. Needs to rest and relax as much as possible to keep from stressing herself out further which is only going to raise BP  further.

## 2019-12-18 NOTE — Telephone Encounter (Signed)
If she is still taking losartan 25 mg daily, she should increase it to 75 mg daily and get BMET in 10 days.  She should check BP daily and call in readings or send MyChart message.

## 2019-12-18 NOTE — Telephone Encounter (Signed)
Pt called in stating that in the last 24 hrs she has taken 87.5 MG of Metoprolol and in the last 12 hrs her BP has stayed at 170/70s she states she can't get in to see Dr. Aundra Dubin until 01/02/20 due to him being on vacation. She is aware that Einar Pheasant is already gone for the day and that I will give Dr. Aundra Dubin office a call to see if I can get her seen by another pcp soon. Please advise

## 2019-12-18 NOTE — Telephone Encounter (Signed)
Called patient with PCP recommendations. Patient states she is nervous about the changes. She states she does not want to have a blood pressure that is high but she also does not want to have one lower than 120. Patient states can she try taking 50mg  of the metoprolol in the morning and try 25 mg of the Losartan at night and see how that does. She states if that does not work tonight she will call the office in the morning to let us know because her bp is usually higher at night.

## 2019-12-18 NOTE — Telephone Encounter (Signed)
Called patient back in reference to PCP recommendations. Patient voiced understanding. Patient still worried about the 50mg  dose of losartan. I informed patient that she could call the office if needed with any questions or concerns.

## 2019-12-18 NOTE — Telephone Encounter (Signed)
DM,FYI   Patient called and left a message on the triage vm stating that her BP has been elevated above 170 for the last 12hrs,she was seen in the ED and that she has taking 87.5mg  of Toprol XL in the past 24hours but her BP remains elevated. After investigating patients I saw that patient was seen in the ER 6/27 and given these instructions:She was given 12.5 mg metoprolol in the ED to help with her pressures.  They have improved since she was initially seen.  Will add 12.5 mg metoprolol to her home dose of 25 mg.  Have advised that she follow-up with her cardiologist and her regular doctor tomorrow to reassess blood pressure.  Advised on return precautions.  She voiced understanding plan reasons to return.  All questions answered.  Patient stable for discharge.   Patient then called our office on 6/28 and spoke with Kevan Rosebush, RN who documented this:Pt called to sch appt w/Dr Aundra Dubin as she was in ER over weekend with htn, she states her BP was in the 180s so she went to ER and it was 208/110 there, work-up and labs there was normal, pt was asymptomatic and was given medication, BP lowered and she was d/c'd home. She states at 3 pm she checked it again and it was back up to 180s and she called EMS, still asymptomatic, pt decided not to back to ER. She is on Metoprolol XL 25 mg daily. She is sch to see her PCP tomorrow for f/u. She denies HA/dizziness/blurred vision/dyspnea. Appt sch for 7/16 with Dr Aundra Dubin, she will f/u with pcp  Patient was then seen by her pcp on 6/29 her d/c instructions were as followed: Please get an arm cuff to check home BP measurements. These are much more reliable. Make sure to check in a seated position with feet flat on the floor, having rested for 5-10 minutes before checking.   Keep a low-salt diet -- see below.  Continue 1.5 Metropolol once daily.  Call me at end of week with BP readings so we can recheck and adjust medications further.  We will see if we can  get you in with Dr. Claris Gladden office sooner.   Hang in there!  Then on 12/17/2019 (yesterday) patients husband called pcp again stating:Patient's husband called and said that her blood pressure 152/89 - at 11:00 am today - He wants to know how often she can take the additional blood pressure medication.  PCP then stated:As discussed yesterday -- she is to take the 1.5 tablets of the Metropolol XL daily, not just as needed. We need to watch BP over the next few days on this regimen, checking with her cuff to get more data. It will take a few days of taking the new dose consistently to make an overall change. If no major improvement we will either increase further or add on another agent for BP.   Patients husband responded by saying:Spoke with the patient's husband he stated that she is taking her blood pressure medication as discussed in her visit yesterday. He stated last night her blood pressure reached 190/59. He would like to know would she be able to take an additional dose of the metoprolol 1.5 tablets and how often would she be able to do so. Please advise.   PCP then stated:I would recommend that we go ahead and increase to 2 tablets (50 mg) daily of the Toprol XL. Can send in new script for 50 mg if she prefers. Also  make sure that she has gotten a new cuff and is checking with that.   Patients husband agreed to plan.  I called patient back after reviewing all of this and stated that she needed to follow up with her pcp since they were the ones who were managing her BP meds and had recently made changes. Patient stated that she needed [place] to make the changes and stated that she took 87.5mg  of metoprolol in the last 24 hours and wanted to know how much was safe to take in that time span. I reiterated again to patient that she needs to contact her pcp about this issue.patient begin getting upset. Patient stated that Dr. Aundra Dubin manages her BP. I advised her that Dr. Aundra Dubin does not manage her BP  and that he only manages her CHF. She then said "yeah if I don't get my bp right im gonna end up having CHF."I then asked patient who told her to take that much? She stated the ED and I went back over what I read in her chart from the ed and her pcp. She then asked for another appointment with Dr. Aundra Dubin I informed her that no one else in the office had any available appointments before the 01/02/20 appointment with Dr. Aundra Dubin, however I did double check to schedule before informing patient of that. Patient then stated to her husband " this is getting no where, talk to her because I cant do it anymore and we might as well just hang up. While patient was stating that I remained quiet until her husband got on the phone. Her husband then explained that on 12/17/19 at 12pm she took 37.5mg  of Metoprolol then she took 12.5mg  at 9pm. Then patient then took 25mg  at 1am today(12/18/19) and then 12.5mg  at 2:30am(12/18/19). After receiving this information I then spoke it over with Adline Potter who stated that the patient should take 50mg  of Toprol XL and follow directions as given by pcp. Also remind patient to relax and avoid from getting overworked. I advised patients husband of the directions given by Adline Potter and Patients husband was very Patent attorney.    As soon as I hung up with the patient her pcp office called the provider line asking if there were any sooner appointments to get the patient in sooner to be seen. I advised them that the 01/02/20 appointment is our first available because our schedules are booked out until September.

## 2019-12-18 NOTE — Telephone Encounter (Signed)
Should not make her BP too low. Also as discussed several times it is going to take more than one day of the change to make a huge effect. She needs to give this a few days.

## 2019-12-19 ENCOUNTER — Ambulatory Visit (INDEPENDENT_AMBULATORY_CARE_PROVIDER_SITE_OTHER): Payer: Medicare HMO | Admitting: Physician Assistant

## 2019-12-19 ENCOUNTER — Other Ambulatory Visit: Payer: Self-pay | Admitting: Physician Assistant

## 2019-12-19 ENCOUNTER — Encounter: Payer: Self-pay | Admitting: Physician Assistant

## 2019-12-19 ENCOUNTER — Other Ambulatory Visit: Payer: Self-pay

## 2019-12-19 VITALS — BP 138/80 | HR 72 | Temp 97.5°F | Resp 18 | Ht 61.0 in | Wt 151.0 lb

## 2019-12-19 DIAGNOSIS — F341 Dysthymic disorder: Secondary | ICD-10-CM

## 2019-12-19 DIAGNOSIS — I10 Essential (primary) hypertension: Secondary | ICD-10-CM

## 2019-12-19 DIAGNOSIS — R69 Illness, unspecified: Secondary | ICD-10-CM | POA: Diagnosis not present

## 2019-12-19 MED ORDER — ALPRAZOLAM 0.25 MG PO TABS: ORAL_TABLET | ORAL | 1 refills | Status: DC

## 2019-12-19 MED ORDER — METOPROLOL TARTRATE 25 MG PO TABS
ORAL_TABLET | ORAL | 0 refills | Status: DC
Start: 2019-12-19 — End: 2019-12-31

## 2019-12-19 NOTE — Telephone Encounter (Signed)
According to patients chart Pcp increased losartan to 50mg  yesterday(12/18/19) he also told patient she needed to give it a few days in order to see a major difference.

## 2019-12-19 NOTE — Telephone Encounter (Signed)
Make sure to call her to set up followup & make sure she's checking BP & it's coming down.

## 2019-12-19 NOTE — Patient Instructions (Signed)
Please continue the Toprol XL 50 mg each morning.  Continue the 50 mg Losartan each afternoon. Keep up with a low-salt diet.  If evening BP is getting 170+, take one of your Alprazolam as I do feel anxiety is a big component to the nighttime elevations.  If still elevated, taking the short acting metoprolol (lopressor) as directed.  Keep check on BP twice daily and record. Follow-up with me in 1 week for reassessment.   Hang in there!

## 2019-12-19 NOTE — Progress Notes (Signed)
Patient presents to clinic today c/o continued elevation in BP readings, last night up to 282 systolic (otherwise asymptomatic) for which she called EMS. Was evaluated with manual BP at 060 systolic. EKG with NSR. Notes she took an Alprazolam with BP improving down to 156 systolic and then 153 systolic. Last night was her first dose of losartan. Has continued taking her Toprol XL 50 mg daily as directed.   Past Medical History:  Diagnosis Date  . Anxiety    no anxiety  . CAD (coronary artery disease)   . Cataract   . Chronic insomnia   . Colon polyp   . Depression   . GERD (gastroesophageal reflux disease)   . GERD with esophagitis   . Heart attack ([place]) 1-09  . Heart murmur   . Hyperlipemia   . Hypertension   . Perirectal abscess   . Shingles 08/31/2017   2nd dx with shingles, per pt    Current Outpatient Medications on File Prior to Visit  Medication Sig Dispense Refill  . acetaminophen (TYLENOL) 650 MG CR tablet Take 650 mg by mouth every 8 (eight) hours as needed for pain (Rarely uses this medication).     . Acetylcarnitine HCl (ACETYL L-CARNITINE) 250 MG CAPS Take by mouth. 200 mg every other day    . albuterol (PROVENTIL HFA;VENTOLIN HFA) 108 (90 Base) MCG/ACT inhaler Inhale 2 puffs into the lungs every 6 (six) hours as needed for wheezing or shortness of breath. 1 Inhaler 0  . ALPRAZolam (XANAX) 0.25 MG tablet TAKE 1 TO 1 & 1/2 TABLET BY MOUTH AT BEDTIME AS NEEDED **NO SUB GREENSTONE BRAND** 30 tablet 1  . atorvastatin (LIPITOR) 80 MG tablet TAKE 1 TABLET BY MOUTH EVERY DAY 90 tablet 1  . b complex vitamins tablet Take 1 tablet by mouth daily.      . Biotin 300 MCG TABS Take 600 mcg by mouth every other day.      . Calcium-Magnesium-Zinc 500-250-12.5 MG TABS Take 1 tablet by mouth every other day.      . Chromium Picolinate 200 MCG TABS Take 1 tablet by mouth every other day.      . clopidogrel (PLAVIX) 75 MG tablet Take 1 tablet (75 mg total) by mouth daily. Needs appt  for further refills 90 tablet 0  . Coenzyme Q10 (COQ10) 200 MG CAPS Take 1 capsule by mouth daily.      Marland Kitchen CRANBERRY JUICE EXTRACT PO Take 500 mg by mouth daily.      Marland Kitchen EPINEPHrine 0.3 mg/0.3 mL IJ SOAJ injection INJECT 0.3 MLS INTO THE MUSCLE ONCE FOR 1 DOSE  0  . famotidine (PEPCID AC) 10 MG tablet Take 1 tablet (10 mg total) by mouth 2 (two) times daily. Take  1-2 tablets twice daily 180 tablet 3  . finasteride (PROSCAR) 5 MG tablet Take 2.5 mg by mouth daily.    . Flaxseed, Linseed, (FLAX SEED OIL) 1000 MG CAPS Take 1,200 mg by mouth daily.      . Garlic Oil 794 MG TABS Take 1 tablet by mouth daily.      Marland Kitchen losartan (COZAAR) 50 MG tablet Take 1 tablet (50 mg total) by mouth daily. 90 tablet 3  . metoprolol succinate (TOPROL-XL) 50 MG 24 hr tablet Take 1 tablet (50 mg total) by mouth daily. Take with or immediately following a meal. 90 tablet 3  . nitroGLYCERIN (NITROSTAT) 0.4 MG SL tablet Place 1 tablet (0.4 mg total) under the tongue every 5 (five)  minutes x 3 doses as needed for chest pain. 25 tablet 3  . omega-3 acid ethyl esters (LOVAZA) 1 g capsule Take 1 capsule (1 g total) by mouth daily. Needs appt for further refills 90 capsule 0  . Probiotic Product (ALIGN PO) Take 1 capsule by mouth daily.    . Rhodiola 300 MG CAPS Take 1 capsule by mouth daily.      . Sodium Sulfate-Mag Sulfate-KCl (SUTAB) [phone] MG TABS Take 1 kit by mouth as directed. 24 tablet 0   No current facility-administered medications on file prior to visit.    Allergies  Allergen Reactions  . Benzonatate     Unknown, patient states "it made me sick"  . Chlorpromazine Hcl     unknown  . Codeine     REACTION: causes vomiting  . Doxycycline     "explosive diarrhea" -does not wish to take this anymore  . Hydrocodone     REACTION: causes vomiing and nausea  . Hydrocodone-Acetaminophen Nausea And Vomiting  . Lovaza [Omega-3-Acid Ethyl Esters] Other (See Comments)    Loose stools with two capsules  .  Penicillins     REACTION: causes rash  . Prochlorperazine Edisylate     unknown   . Promethazine Hcl     unknown  . Sulfonamide Derivatives     unknown     Family History  Problem Relation Age of Onset  . Skin cancer Mother   . Allergy (severe) Father   . Heart disease Father   . Leukemia Father        CML  . Renal Disease Paternal Grandfather        one removed   . Colon cancer Neg Hx   . Colon polyps Neg Hx   . Rectal cancer Neg Hx   . Stomach cancer Neg Hx     Social History   Socioeconomic History  . Marital status: Married    Spouse name: Not on file  . Number of children: 2  . Years of education: Not on file  . Highest education level: Not on file  Occupational History  . Occupation: Risk manager  Tobacco Use  . Smoking status: Former Smoker    Packs/day: 2.00    Years: 20.00    Pack years: 40.00    Types: Cigarettes    Quit date: 06/19/2006    Years since quitting: 13.5  . Smokeless tobacco: Never Used  Vaping Use  . Vaping Use: Never used  Substance and Sexual Activity  . Alcohol use: Not Currently    Alcohol/week: 0.0 standard drinks    Comment: <2 monthly; rare  . Drug use: No  . Sexual activity: Not on file  Other Topics Concern  . Not on file  Social History Narrative   Resides in [place] with her husband.    Husband works in [place] and comes home two to three times a year   2 children who go to [place]   Just got accepted to [employer]   Exercise- NO   Social Determinants of Health   Financial Resource Strain:   . Difficulty of Paying Living Expenses:   Food Insecurity:   . Worried About Charity fundraiser in the Last Year:   . Arboriculturist in the Last Year:   Transportation Needs:   . Film/video editor (Medical):   Marland Kitchen Lack of Transportation (Non-Medical):   Physical Activity:   . Days of Exercise per Week:   . Minutes of  Exercise per Session:   Stress:   . Feeling of Stress :   Social Connections:   . Frequency of  Communication with Friends and Family:   . Frequency of Social Gatherings with Friends and Family:   . Attends Religious Services:   . Active Member of Clubs or Organizations:   . Attends Archivist Meetings:   Marland Kitchen Marital Status:    Review of Systems - See HPI.  All other ROS are negative.  BP 138/80   Pulse 72   Temp (!) 97.5 F (36.4 C) (Temporal)   Resp 18   Ht '5\' 1"'  (1.549 m)   Wt 151 lb (68.5 kg)   SpO2 98%   BMI 28.53 kg/m   Physical Exam Vitals reviewed.  Constitutional:      Appearance: Normal appearance.  HENT:     Head: Normocephalic and atraumatic.  Eyes:     Conjunctiva/sclera: Conjunctivae normal.  Cardiovascular:     Rate and Rhythm: Normal rate and regular rhythm.     Pulses: Normal pulses.     Heart sounds: Normal heart sounds.  Pulmonary:     Effort: Pulmonary effort is normal.     Breath sounds: Normal breath sounds.  Musculoskeletal:     Cervical back: Neck supple.  Neurological:     General: No focal deficit present.     Mental Status: She is alert and oriented to person, place, and time.  Psychiatric:        Mood and Affect: Mood normal.    Recent Results (from the past 2160 hour(s))  HM MAMMOGRAPHY     Status: None   Collection Time: 09/25/19 12:00 AM  Result Value Ref Range   HM Mammogram 0-4 Bi-Rad 0-4 Bi-Rad, Self Reported Normal    Comment: Bi-Rads 1 Negative  CBC with Differential     Status: Abnormal   Collection Time: 12/14/19  8:20 PM  Result Value Ref Range   WBC 9.0 4.0 - 10.5 K/uL   RBC 5.13 (H) 3.87 - 5.11 MIL/uL   Hemoglobin 15.1 (H) 12.0 - 15.0 g/dL   HCT 46.3 (H) 36 - 46 %   MCV 90.3 80.0 - 100.0 fL   MCH 29.4 26.0 - 34.0 pg   MCHC 32.6 30.0 - 36.0 g/dL   RDW 13.2 11.5 - 15.5 %   Platelets 248 150 - 400 K/uL   nRBC 0.0 0.0 - 0.2 %   Neutrophils Relative % 62 %   Neutro Abs 5.6 1.7 - 7.7 K/uL   Lymphocytes Relative 29 %   Lymphs Abs 2.6 0.7 - 4.0 K/uL   Monocytes Relative 6 %   Monocytes Absolute 0.5 0 -  1 K/uL   Eosinophils Relative 2 %   Eosinophils Absolute 0.2 0 - 0 K/uL   Basophils Relative 1 %   Basophils Absolute 0.1 0 - 0 K/uL   Immature Granulocytes 0 %   Abs Immature Granulocytes 0.02 0.00 - 0.07 K/uL    Comment: Performed at [facility], [address] [place], [place] [address]  Basic metabolic panel     Status: None   Collection Time: 12/14/19  8:20 PM  Result Value Ref Range   Sodium 140 135 - 145 mmol/L   Potassium 3.7 3.5 - 5.1 mmol/L   Chloride 103 98 - 111 mmol/L   CO2 25 22 - 32 mmol/L   Glucose, Bld 97 70 - 99 mg/dL    Comment: Glucose reference range applies only to  samples taken after fasting for at least 8 hours.   BUN 11 8 - 23 mg/dL   Creatinine, Ser 0.68 0.44 - 1.00 mg/dL   Calcium 9.1 8.9 - 10.3 mg/dL   GFR calc non Af Amer >60 >60 mL/min   GFR calc Af Amer >60 >60 mL/min   Anion gap 12 5 - 15    Comment: Performed at [facility] [address], [place], [place] [address]  Troponin I (High Sensitivity)     Status: None   Collection Time: 12/14/19  8:20 PM  Result Value Ref Range   Troponin I (High Sensitivity) 14 <18 ng/L    Comment: (NOTE) Elevated high sensitivity troponin I (hsTnI) values and significant  changes across serial measurements may suggest ACS but many other  chronic and acute conditions are known to elevate hsTnI results.  Refer to the "Links" section for chest pain algorithms and additional  guidance. Performed at [facility], [place] [address], [place], [place] [address]   TSH     Status: None   Collection Time: 12/16/19  2:17 PM  Result Value Ref Range   TSH 3.80 0.35 - 4.50 uIU/mL   Assessment/Plan: 1. Essential hypertension BP today at 130/80 much improved from prior in-office checks recently and significant better than home BP checks and check by EMS last night. Reviewed her BP log again and it seems she is checking BP an excessive number of times, especially when initial check reveals elevated BP.  Discussed again that level of anxiety and worry is only further increasing her BP, which is likely why taking the Xanax had such a profound impact on BP levels. Today BP much better. Discussed it will take several days of the losartan before it reaches a steady state on its effect on her BP. She agrees to continue current regimen throughout the weekend to see further progress in BP level and stability. Discussed limiting number of checks when elevated. Take Xanax as directed, each evening until we get the losartan fully in her system. Giving she is asymptomatic, no further primary care workup indicated, just medication management. Has appointment in a couple of weeks with Cardiology. Is on a cancellation list with them as well.   2. ANXIETY DEPRESSION Medications refilled.  - ALPRAZolam (XANAX) 0.25 MG tablet; TAKE 1 TO 1 & 1/2 TABLET BY MOUTH AT BEDTIME AS NEEDED **NO SUB GREENSTONE BRAND**  Dispense: 15 tablet; Refill: 1   This visit occurred during the SARS-CoV-2 public health emergency.  Safety protocols were in place, including screening questions prior to the visit, additional usage of staff PPE, and extensive cleaning of exam room while observing appropriate contact time as indicated for disinfecting solutions.    Leeanne Rio, PA-C

## 2019-12-23 ENCOUNTER — Telehealth: Payer: Self-pay | Admitting: Emergency Medicine

## 2019-12-23 NOTE — Telephone Encounter (Signed)
Pt already has pending appt for next week 7/16. Patient was seen by pcp 7/2

## 2019-12-23 NOTE — Telephone Encounter (Signed)
Advised patient and husband of PCP recommendations and both were agreeable and understandable.   Cody recommend patient continue the current regimen and checking blood pressure the rest of the week and call back on Friday with readings and will make adjustment then.    Spoke with PCP of recommendations.   Patient husband calling with blood pressure readings. Her AM BP running 140-160 Her PM BP running 170-190 averaging 180's Taking Losartan 50 mg daily and Xanax Yesterday one reading of 130 one time

## 2019-12-26 DIAGNOSIS — R350 Frequency of micturition: Secondary | ICD-10-CM | POA: Diagnosis not present

## 2019-12-26 DIAGNOSIS — Z0189 Encounter for other specified special examinations: Secondary | ICD-10-CM | POA: Diagnosis not present

## 2019-12-30 ENCOUNTER — Telehealth: Payer: Self-pay | Admitting: Physician Assistant

## 2019-12-30 NOTE — Telephone Encounter (Signed)
Pt called in asking if she could take 50 mg of Metoprolol in the morning and at night. She states that her BP is still high at night when she is taking the metoprolol, losartan, and the Xanax. She states that she doesn't want to try any new meds or add any additional meds to what she is already doing. She states that she is going to see her Cardiologist on Friday. Please advise. Ok LM on the cell # if she doesn't answer.

## 2019-12-30 NOTE — Telephone Encounter (Signed)
Patient states she is taking her medication as listed Metoprolol XL 50 mg in morning and Metoprolol 25 mg nightly, Losartan 50 mg nightly and Xanax 0.25 mg nightly in evening. Her evening BP is back up to 195/98,179/89 154/86 Pulse running 55-60.   Taking Metoprolol XL 50 mg 11:30a-1 pm.  She is staying up until 3-4 am most nights. She is taking Losartan 50 mg nightly about 11p then, blood pressure is still elevated then she takes the Metoprolol and blood pressure continues to be elevated then she takes the Xanax.   Patient wanted to increase her Xanax dosage. Patient wanted to add Metoprolol XL 50 mg at midnight.

## 2019-12-31 ENCOUNTER — Other Ambulatory Visit: Payer: Self-pay | Admitting: Physician Assistant

## 2019-12-31 NOTE — Telephone Encounter (Signed)
Unfortunately I am not going to be able to help get things under better control if she will not let us make appropriate changes. Anxiety is clearly a big factor in persistent elevations and in how labile BP is as BP continues to climb as she frequently checks BP throughout the night and getting more stressed and worried about it. I hope she will reconsider a longer-acting medication for anxiety as well as reconsider letting us adjust dose of losartan.   Her recent refill of Xanax had an additional fill on it so she can request from the pharmacy.  Ok to refill Quant 10 of short-acting Metoprolol to get her to her Cardiology appointment. Hopefully she will let them make adjustments to get BP under better control.

## 2019-12-31 NOTE — Telephone Encounter (Signed)
Spoke with patient about PCP recommendations. She did take her Metoprolol XL 50 mg at dinner last nigh and her blood pressure last night was 170 and 177 this morning. She denies increasing the dose of the Losartan (causing increased urination) She denies changing Metoprolol to Amlodipine. She wants to wait until she see the Cardiologist.   She does not want to take a daily anxiety medication. This morning patient was getting ready to go to the hospital due to her dad being in the hospital.  Patient requested a refill of the Metoprolol 25 mg and Xanax 0.25 mg.   Please advise

## 2019-12-31 NOTE — Telephone Encounter (Signed)
Pulse is too low on current dose of the medication to increase Metoprolol XL further. We have a few options here: (1) I would have her take the Metoprolol XL a bit later in the day (maybe at dinner time) since she tends to be up later at night and sleep more so in the morning and early afternoon. It would hit peak effect in the evening when her BP seems to climb and likely better control it throughout the night. (2) We can also consider adjusting the dose of her losartan or splitting doses of that to get better control. (3) If she is not agreeable to either option then we will need to consider changing her metoprolol to something like Amlodipine which will still have some effect on her heart rate but may have more of a profound effect on her BP itself.   In regards to the Xanax, we can certainly adjust the dose but just based on recent interactions with her and her level of anxiety (which I feel is a big contributing force to the BP elevations) I would recommend we consider adding on a once daily medication for the anxiety as well.

## 2020-01-01 DIAGNOSIS — H6063 Unspecified chronic otitis externa, bilateral: Secondary | ICD-10-CM | POA: Diagnosis not present

## 2020-01-01 DIAGNOSIS — H903 Sensorineural hearing loss, bilateral: Secondary | ICD-10-CM | POA: Diagnosis not present

## 2020-01-01 DIAGNOSIS — L309 Dermatitis, unspecified: Secondary | ICD-10-CM | POA: Diagnosis not present

## 2020-01-01 DIAGNOSIS — H6123 Impacted cerumen, bilateral: Secondary | ICD-10-CM | POA: Diagnosis not present

## 2020-01-01 DIAGNOSIS — Z974 Presence of external hearing-aid: Secondary | ICD-10-CM | POA: Diagnosis not present

## 2020-01-01 NOTE — Telephone Encounter (Signed)
Metoprolol refilled for #10 pills yesterday. Patient wanted to wait to see Cardiology for any medication changes.

## 2020-01-02 ENCOUNTER — Ambulatory Visit (HOSPITAL_COMMUNITY)
Admission: RE | Admit: 2020-01-02 | Discharge: 2020-01-02 | Disposition: A | Discharge status: Home / Self Care | Payer: Medicare HMO | Source: Ambulatory Visit | Attending: Cardiology | Admitting: Cardiology

## 2020-01-02 ENCOUNTER — Other Ambulatory Visit: Payer: Self-pay

## 2020-01-02 VITALS — BP 120/82 | HR 97 | Wt 152.0 lb

## 2020-01-02 DIAGNOSIS — Z8249 Family history of ischemic heart disease and other diseases of the circulatory system: Secondary | ICD-10-CM | POA: Diagnosis not present

## 2020-01-02 DIAGNOSIS — I10 Essential (primary) hypertension: Secondary | ICD-10-CM | POA: Insufficient documentation

## 2020-01-02 DIAGNOSIS — Z79899 Other long term (current) drug therapy: Secondary | ICD-10-CM | POA: Diagnosis not present

## 2020-01-02 DIAGNOSIS — E785 Hyperlipidemia, unspecified: Secondary | ICD-10-CM | POA: Insufficient documentation

## 2020-01-02 DIAGNOSIS — I2510 Atherosclerotic heart disease of native coronary artery without angina pectoris: Secondary | ICD-10-CM

## 2020-01-02 DIAGNOSIS — Z7902 Long term (current) use of antithrombotics/antiplatelets: Secondary | ICD-10-CM | POA: Insufficient documentation

## 2020-01-02 DIAGNOSIS — I252 Old myocardial infarction: Secondary | ICD-10-CM | POA: Insufficient documentation

## 2020-01-02 DIAGNOSIS — Z7901 Long term (current) use of anticoagulants: Secondary | ICD-10-CM | POA: Diagnosis not present

## 2020-01-02 DIAGNOSIS — Z87891 Personal history of nicotine dependence: Secondary | ICD-10-CM | POA: Insufficient documentation

## 2020-01-02 DIAGNOSIS — I351 Nonrheumatic aortic (valve) insufficiency: Secondary | ICD-10-CM | POA: Insufficient documentation

## 2020-01-02 LAB — LIPID PANEL
Cholesterol: 169 mg/dL (ref 0–200)
HDL: 52 mg/dL (ref >40)
LDL Cholesterol: 95 mg/dL (ref 0–99)
Total CHOL/HDL Ratio: 3.3 RATIO
Triglycerides: 108 mg/dL (ref <150)
VLDL: 22 mg/dL (ref 0–40)

## 2020-01-02 MED ORDER — LISINOPRIL 20 MG PO TABS
20.0000 mg | ORAL_TABLET | Freq: Every day | ORAL | 11 refills | Status: DC
Start: 2020-01-02 — End: 2020-01-26

## 2020-01-02 NOTE — Progress Notes (Signed)
Patient Name: Emily [name]        DOB: [birth year]      Height: 5'1"    [facility]         Referring Provider: [name], MD  Today's Date: 01/02/20   STOP BANG RISK ASSESSMENT S (snore) Have you been told that you snore?     YES   T (tired) Are you often tired, fatigued, or sleepy during the day?   NO  O (obstruction) Do you stop breathing, choke, or gasp during sleep? NO   P (pressure) Do you have or are you being treated for high blood pressure? YES   B (BMI) Is your body index greater than 35 kg/m? NO   A (age) Are you 25 years old or older? YES   N (neck) Do you have a neck circumference greater than 16 inches?   NO   G (gender) Are you a female? NO   TOTAL STOP/BANG "YES" ANSWERS                                                                        For Office Use Only              Procedure Order Form    YES to 3+ Stop Bang questions OR two clinical symptoms - patient qualifies for WatchPAT (CPT 95800)     Submit: This Form + Patient Face Sheet + Clinical Note via [employer] or Fax: [phone]         Clinical Notes: Will consult Sleep Specialist and refer for management of therapy due to patient increased risk of Sleep Apnea. Ordering a sleep study due to the following two clinical symptoms:Loud snoring R06.83  History of high blood pressure R03.0    I understand that I am proceeding with a home sleep apnea test as ordered by my treating physician. I understand that untreated sleep apnea is a serious cardiovascular risk factor and it is my responsibility to perform the test and seek management for sleep apnea. I will be contacted with the results and be managed for sleep apnea by a local sleep physician. I will be receiving equipment and further instructions from [facility]. I shall promptly ship back the equipment via the included mailing label. I understand my insurance will be billed for the test and as the  patient I am responsible for any insurance related out-of-pocket costs incurred. I have been provided with written instructions and can call for additional video or telephonic instruction, with 24-hour availability of qualified personnel to answer any questions: Patient Help Desk [phone].  Patient Signature ______________________________________________________   Date______________________ Patient Telemedicine Verbal Consent

## 2020-01-02 NOTE — Patient Instructions (Signed)
STOP Losartan  START Lisinopril 20 mg, one tab daily   Labs today We will only contact you if something comes back abnormal or we need to make some changes. Otherwise no news is good news!  Labs needed in 10-14 days  Your provider has recommended that you have a home sleep study.  [employer] is the company that does these test.  They will contact you by phone and must speak with you before they can ship the equipment.  Once they have spoken with you they will send the equipment right to your home with instructions on how to set it up.  Once you have completed the test simply box all the equipment back up and mail back to the company.  IF you have any questions or issues with the equipment please call the company directly at [phone].  If your test is positive for sleep apnea and you need a home CPAP machine you will be contacted by Dr Theodosia Blender office [facility]) to set this up.   Your physician has requested that you have a renal artery duplex. During this test, an ultrasound is used to evaluate blood flow to the kidneys. Allow one hour for this exam. Do not eat after midnight the day before and avoid carbonated beverages. Take your medications as you usually do.  Your physician recommends that you schedule a follow-up appointment in: 3 weeks with [place] physician recommends that you schedule a follow-up appointment in: 2 months with Dr Aundra Dubin  At the [facility], you and your health needs are our priority. As part of our continuing mission to provide you with exceptional heart care, we have created designated Provider Care Teams. These Care Teams include your primary Cardiologist (physician) and Advanced Practice Providers (APPs- Physician Assistants and Nurse Practitioners) who all work together to provide you with the care you need, when you need it.   You may see any of the following providers on your designated Care Team at your next follow  up: Marland Kitchen Dr Glori Bickers . Dr [name] . Darrick Grinder, NP . Lyda Jester, PA . Audry Riles, PharmD   Please be sure to bring in all your medications bottles to every appointment.

## 2020-01-04 NOTE — Progress Notes (Signed)
Date:  01/04/2020   ID:  Emily [name], DOB [birth year], MRN [id number]   Provider location: [facility] Type of Visit: Established patient   PCP:  Brunetta Jeans, PA-C  Cardiologist:  Dr. Aundra Dubin   History of Present Illness: Emily [name] is a 69 y.o. female who has a history of CAD s/p inferior MI in 1/09 treated with PCI to [facility].  She has had no cardiac events since that time.  Echo in 9/16 showed EF 55-60% with mild aortic insufficiency.    She has had high blood pressure recently, readings up to the 650P systolic.  She takes her pressure multiple times a day.  She was started on losartan but does not like it, says she feels like it makes her feel urinary urgency. She has had no chest pain or dyspnea.  No headache.  She does snore though she denies much daytime sleepiness.    Labs (12/14): LDL 90, HDL 41 Labs (6/15): LDL 106, HDL 48 Labs (7/16): LDL 102 Labs (2/17): K 4.4, creatinine 0.65, LDL 123, HDL 49, TGs 266 Labs (8/17): LDL 85, TGs 165, HDL 52 Labs (9/18): K 3.4, creatinine 0.66 Labs (1/19): LDL 101, HDL 50 Labs (8/19): LDL 104 Labs (1/20): K 3.7, creatinine 0.65 Labs (6/21): K 3.7, creatinine 0.68, hgb 15, TSH nl  ECG (personally reviewed): NSR 52  PMH: 1. CAD: Inferior MI in 1/09 with culprit vessel a large OM3.  This was treated with PCI.  Had residual 50-70% mLAD stenosis.  Echo (3/14) with EF 60-65%, mild-moderate AI - Echo (9/16): EF 55-60%, inferoseptal akinesis, mild AI.  2. HTN 3. H/o fen-phen use 4. Aortic insufficiency: Mild to moderate on echo in 3/14. Mild on 9/16 echo.  5. Carotid dopplers (9/12) with mild plaque.  6. Hyperlipidemia  Current Outpatient Medications  Medication Sig Dispense Refill   Acetylcarnitine HCl (ACETYL L-CARNITINE) 250 MG CAPS Take by mouth. 200 mg every other day     ALPRAZolam (XANAX) 0.25 MG tablet TAKE 1 TO 1 & 1/2 TABLET BY MOUTH AT BEDTIME AS NEEDED **NO SUB GREENSTONE BRAND** 15  tablet 1   atorvastatin (LIPITOR) 80 MG tablet TAKE 1 TABLET BY MOUTH EVERY DAY 90 tablet 1   b complex vitamins tablet Take 1 tablet by mouth daily.       Biotin 300 MCG TABS Take 600 mcg by mouth every other day.       Calcium-Magnesium-Zinc 500-250-12.5 MG TABS Take 1 tablet by mouth every other day.       Chromium Picolinate 200 MCG TABS Take 1 tablet by mouth every other day.       clopidogrel (PLAVIX) 75 MG tablet Take 1 tablet (75 mg total) by mouth daily. Needs appt for further refills 90 tablet 0   Coenzyme Q10 (COQ10) 200 MG CAPS Take 1 capsule by mouth daily.       CRANBERRY JUICE EXTRACT PO Take 500 mg by mouth daily.       famotidine (PEPCID AC) 10 MG tablet Take 1 tablet (10 mg total) by mouth 2 (two) times daily. Take  1-2 tablets twice daily 180 tablet 3   finasteride (PROSCAR) 5 MG tablet Take 2.5 mg by mouth daily.     Flaxseed, Linseed, (FLAX SEED OIL) 1000 MG CAPS Take 1,200 mg by mouth daily.       Garlic Oil 546 MG TABS Take 1 tablet by mouth daily.       metoprolol  succinate (TOPROL-XL) 50 MG 24 hr tablet Take 1 tablet (50 mg total) by mouth daily. Take with or immediately following a meal. 90 tablet 3   metoprolol tartrate (LOPRESSOR) 25 MG tablet Take 1 tablet by mouth in the evening if BP above 841 systolic 10 tablet 0   nitroGLYCERIN (NITROSTAT) 0.4 MG SL tablet Place 1 tablet (0.4 mg total) under the tongue every 5 (five) minutes x 3 doses as needed for chest pain. 25 tablet 3   omega-3 acid ethyl esters (LOVAZA) 1 g capsule Take 1 capsule (1 g total) by mouth daily. Needs appt for further refills 90 capsule 0   Probiotic Product (ALIGN PO) Take 1 capsule by mouth daily.     Sodium Sulfate-Mag Sulfate-KCl (SUTAB) [phone] MG TABS Take 1 kit by mouth as directed. 24 tablet 0   EPINEPHrine 0.3 mg/0.3 mL IJ SOAJ injection INJECT 0.3 MLS INTO THE MUSCLE ONCE FOR 1 DOSE (Patient not taking: Reported on 01/02/2020)  0   lisinopril (ZESTRIL) 20 MG  tablet Take 1 tablet (20 mg total) by mouth daily. 30 tablet 11   [place] 300 MG CAPS Take 1 capsule by mouth daily.   (Patient not taking: Reported on 01/02/2020)     No current facility-administered medications for this encounter.    Allergies:   Benzonatate, Chlorpromazine hcl, Codeine, Doxycycline, Hydrocodone, Hydrocodone-acetaminophen, Lovaza [omega-3-acid ethyl esters], Penicillins, Prochlorperazine edisylate, Promethazine hcl, and Sulfonamide derivatives   Social History:  The patient  reports that she quit smoking about 13 years ago. Her smoking use included cigarettes. She has a 40.00 pack-year smoking history. She has never used smokeless tobacco. She reports previous alcohol use. She reports that she does not use drugs.   Family History:  The patient's family history includes Allergy (severe) in her father; Heart disease in her father; Leukemia in her father; Renal Disease in her paternal grandfather; Skin cancer in her mother.   ROS:  Please see the history of present illness.   All other systems are personally reviewed and negative.   Exam:   BP 120/82    Pulse 97    Wt 68.9 kg (152 lb)    SpO2 (!) 82%    BMI 28.72 kg/m   General: NAD Neck: No JVD, no thyromegaly or thyroid nodule.  Lungs: Clear to auscultation bilaterally with normal respiratory effort. CV: Nondisplaced PMI.  Heart regular S1/S2, no S3/S4, no murmur.  No peripheral edema.  No carotid bruit.  Normal pedal pulses.  Abdomen: Soft, nontender, no hepatosplenomegaly, no distention.  Skin: Intact without lesions or rashes.  Neurologic: Alert and oriented x 3.  Psych: Normal affect. Extremities: No clubbing or cyanosis.  HEENT: Normal.   Recent Labs: 12/14/2019: BUN 11; Creatinine, Ser 0.68; Hemoglobin 15.1; Platelets 248; Potassium 3.7; Sodium 140 12/16/2019: TSH 3.80  Personally reviewed   Wt Readings from Last 3 Encounters:  01/02/20 68.9 kg (152 lb)  12/19/19 68.5 kg (151 lb)  12/16/19 68.9 kg (151 lb  12.8 oz)    ASSESSMENT AND PLAN:  1. CAD: Stable with no ischemic symptoms.  She had PCI in 1/09 in the setting of inferior MI.  She had residual untreated 50-70% mLAD stenosis.   - Continue Toprol XL and statin. - She has been on Plavix long-term with no evidence for GI bleeding.   2. Hyperlipidemia: Goal LDL < 70.  LDL has been > goal on atorvastatin 80 mg daily. She cannot tolerate Crestor 40 mg daily.  She has not wanted to  take Zetia.  We discussed Repatha.   - I will arrange to check lipids today.  If LDL > goal, will refer her to lipid clinic for [place].  She is reluctant but agrees to aggressive treatment if LDL remains above goal.  3. Aortic insufficiency: Most recent echo showed only mild aortic insufficiency (had been moderate in the past).  Given lack of symptoms and no significant murmur, will not repeat echo at this time.  4. HTN: BP has been elevated and she does not feel like she can tolerate losartan.  - Continue Toprol XL.  - Stop losartan, start lisinopril 20 mg daily.  BMET in 10 days.  - Followup with clinical pharmacist in 3 wks, titrate BP meds as needed.  - Sleep study, OSA may drive HTN.  - Renal artery dopplers to assess for renal artery stenosis.   Followup in 2 months with me, see clinical pharmacist for BP med titration in 3 wks.   Signed, [name], MD  01/04/2020  [facility] [address] [facility] [place] [address] [phone] (office) [phone] (fax)

## 2020-01-05 ENCOUNTER — Telehealth (HOSPITAL_COMMUNITY): Payer: Self-pay

## 2020-01-05 DIAGNOSIS — E782 Mixed hyperlipidemia: Secondary | ICD-10-CM

## 2020-01-05 NOTE — Telephone Encounter (Signed)
Patients husband advised and verbalized understanding. Referral placed

## 2020-01-05 NOTE — Telephone Encounter (Signed)
-----   Message from Larey Dresser, MD sent at 01/04/2020  8:58 PM EDT ----- Refer to lipid clinic for [place], she cannot tolerate Crestor. Goal LDL with CAD < 70.

## 2020-01-06 ENCOUNTER — Telehealth (HOSPITAL_COMMUNITY): Payer: Self-pay | Admitting: Cardiology

## 2020-01-06 DIAGNOSIS — H9190 Unspecified hearing loss, unspecified ear: Secondary | ICD-10-CM | POA: Diagnosis not present

## 2020-01-06 NOTE — Telephone Encounter (Signed)
Your physician has requested that you have a renal artery duplex. During this test, an ultrasound is used to evaluate blood flow to the kidneys. Allow one hour for this exam. Do not eat after midnight the day before and avoid carbonated beverages. Take your medications as you usually do.

## 2020-01-06 NOTE — Telephone Encounter (Signed)
Patient would like a nurse to call her and discuss how her Renal test will proceed. She was also under the impression that she would need a Doppler test and is unsure as to why the Cardiologist will be scanning her Kidneys when her Urologist could do that.  Please call the patient to discuss the test after 11:00 am tomorrow (01/07/20)

## 2020-01-06 NOTE — Telephone Encounter (Signed)
Per DM last OV note Renal artery dopplers to assess for renal artery stenosis.

## 2020-01-07 NOTE — Telephone Encounter (Signed)
Patient advised and verbalized understanding. However, she wants to know why she is not having an echo or a carotid US first before doing a renal US.

## 2020-01-07 NOTE — Telephone Encounter (Signed)
Patient advised and verbalized understanding 

## 2020-01-07 NOTE — Telephone Encounter (Signed)
It is looking for secondary cause of hypertension.  Those other studies do not help [place] with that.

## 2020-01-08 ENCOUNTER — Telehealth (HOSPITAL_COMMUNITY): Payer: Self-pay | Admitting: Cardiology

## 2020-01-08 NOTE — Telephone Encounter (Signed)
Order, OV note, stop bang and demographics all faxed to [facility] at [phone]  

## 2020-01-12 ENCOUNTER — Telehealth: Payer: Self-pay | Admitting: Gastroenterology

## 2020-01-12 NOTE — Telephone Encounter (Signed)
Left message for patient to call back  

## 2020-01-12 NOTE — Telephone Encounter (Signed)
Pt is requesting a call back from Sheri 

## 2020-01-12 NOTE — Telephone Encounter (Signed)
Patient with HTN.  Will call back to reschedule once under control.

## 2020-01-13 ENCOUNTER — Other Ambulatory Visit: Payer: Self-pay | Admitting: Physician Assistant

## 2020-01-13 ENCOUNTER — Other Ambulatory Visit (HOSPITAL_COMMUNITY): Payer: Self-pay | Admitting: Cardiology

## 2020-01-13 DIAGNOSIS — L658 Other specified nonscarring hair loss: Secondary | ICD-10-CM | POA: Diagnosis not present

## 2020-01-13 DIAGNOSIS — F341 Dysthymic disorder: Secondary | ICD-10-CM

## 2020-01-13 NOTE — Telephone Encounter (Signed)
Xanax last rx 12/19/19 #15 1 RF LOV: 12/19/19 HTN

## 2020-01-14 ENCOUNTER — Other Ambulatory Visit: Payer: Self-pay

## 2020-01-14 ENCOUNTER — Ambulatory Visit (HOSPITAL_COMMUNITY)
Admission: RE | Admit: 2020-01-14 | Discharge: 2020-01-14 | Disposition: A | Discharge status: Home / Self Care | Payer: Medicare HMO | Source: Ambulatory Visit | Attending: Internal Medicine | Admitting: Internal Medicine

## 2020-01-14 DIAGNOSIS — I2510 Atherosclerotic heart disease of native coronary artery without angina pectoris: Secondary | ICD-10-CM | POA: Insufficient documentation

## 2020-01-14 LAB — BASIC METABOLIC PANEL
Anion gap: 7 (ref 5–15)
BUN: 12 mg/dL (ref 8–23)
CO2: 29 mmol/L (ref 22–32)
Calcium: 8.8 mg/dL — ABNORMAL LOW (ref 8.9–10.3)
Chloride: 106 mmol/L (ref 98–111)
Creatinine, Ser: 0.68 mg/dL (ref 0.44–1.00)
GFR calc Af Amer: >60 mL/min (ref >60)
GFR calc non Af Amer: >60 mL/min (ref >60)
Glucose, Bld: 102 mg/dL — ABNORMAL HIGH (ref 70–99)
Potassium: 3.8 mmol/L (ref 3.5–5.1)
Sodium: 142 mmol/L (ref 135–145)

## 2020-01-15 ENCOUNTER — Telehealth: Payer: Self-pay | Admitting: *Deleted

## 2020-01-15 ENCOUNTER — Other Ambulatory Visit (HOSPITAL_COMMUNITY): Payer: Self-pay | Admitting: *Deleted

## 2020-01-15 ENCOUNTER — Encounter: Payer: Self-pay | Admitting: *Deleted

## 2020-01-15 DIAGNOSIS — I10 Essential (primary) hypertension: Secondary | ICD-10-CM

## 2020-01-15 NOTE — Telephone Encounter (Signed)
-----   Message from Harvie Junior, [place] sent at 01/15/2020  2:31 PM EDT ----- Regarding: hypertension clinic Good afternoon, Raquel Sarna told me to reach out to you. The above name patient is having issues with her bp fluctuating regardless of medication changes.Pt is scheduled to see our pharmacist on 8/10 for med titration but Dr.McLean feels she would benefit from the hypertension clinic. He would like her to be worked in as soon as possible. Please let me know if this is something you can help with.  Thank you, Genelle Bal

## 2020-01-15 NOTE — Telephone Encounter (Signed)
This encounter was created in error - please disregard.

## 2020-01-15 NOTE — Telephone Encounter (Signed)
Scheduled patient for 01/26/2020 at 3:00 pm   Left message to call back

## 2020-01-16 ENCOUNTER — Telehealth (HOSPITAL_COMMUNITY): Payer: Self-pay | Admitting: *Deleted

## 2020-01-16 NOTE — Telephone Encounter (Signed)
Pt left VM requesting sooner appt than 8/10 with pharmD because her bp has been fluctuating and sometimes her systolic bp can run in the 170s. Per Dr.McLean patient should see the hypertension clinic and cancel pharmD appt. Appointment scheduled with Dr.Tiffany Oval Linsey at  [facility] hypertension clinic.  I called pt to inform her of referral pt did not answer left vm requesting return call.

## 2020-01-19 ENCOUNTER — Telehealth: Payer: Self-pay | Admitting: Cardiovascular Disease

## 2020-01-19 NOTE — Telephone Encounter (Signed)
Patient is returning a call someone left her, which stated they were calling from Dr. Blenda Mounts office and to return their call. Patient was not happy with the voicemail that was left as it left very little information. She states she does not know a doctor by that name, advised the patient her appointment with her on 8/9, patient states she knew nothing about this appointment. Patient requested that if another voicemail needs to be left, to be more informative. Please call back.

## 2020-01-19 NOTE — Telephone Encounter (Signed)
Called and spoke with pt who was upset while we spoke on the phone. She reports she did not know who Dr.[place] was or what the appt was about. Notified that it looked like Dr.McLean had put in a referral for her to see Dr.[place] for her hypertension clinic. Pt stated "so this is how they do things now? I'm seeing a different doctor for my cholesterol and a different doctor for my blood pressure?". Notified that Dr.Alcona has the specialty hypertension clinic. Pt stated that we also made her an appt without asking and she does not like this. Notified that the appt may have been made so she would have a spot and this is why the nurse had called and asked that she call the office back to verify if this time was okay.  Notified that we had received a message from a CMA with Dr.McLean's office stating-   "The above name patient is having issues with her bp fluctuating regardless of medication changes.Pt is scheduled to see our pharmacist on 8/10 for med titration but Dr.McLean feels she would benefit from the hypertension clinic. He would like her to be worked in as soon as possible. "  Pt wanting to know if she neededs to keep her pharmacist appt on 8/10. Notified that per that message Dr.McLean "fells she would benefit from the hypertension clinic." notified it would be best to keep the appt with Dr.[place] on 01/26/20 at 3pm and she could cancel the pharmacy appt. Pt verbalized understanding and was thankful for the call and assistance. No other questions at this time.  Pt also states that it is okay to leave detailed messages and that her husband is on her HIPAA form. She also states she would like it noted in her chart that she is hearing impaired.   Will route message to Dr. and primary RN

## 2020-01-20 ENCOUNTER — Ambulatory Visit: Payer: Medicare HMO

## 2020-01-21 ENCOUNTER — Encounter: Payer: Medicare HMO | Admitting: Gastroenterology

## 2020-01-21 NOTE — Telephone Encounter (Signed)
Tried to call patient, memory full on voicemail

## 2020-01-21 NOTE — Telephone Encounter (Signed)
See 8/2 telephone note

## 2020-01-23 DIAGNOSIS — J019 Acute sinusitis, unspecified: Secondary | ICD-10-CM | POA: Diagnosis not present

## 2020-01-26 ENCOUNTER — Ambulatory Visit: Payer: Medicare HMO | Admitting: Cardiovascular Disease

## 2020-01-26 ENCOUNTER — Other Ambulatory Visit: Payer: Self-pay

## 2020-01-26 ENCOUNTER — Encounter: Payer: Self-pay | Admitting: Cardiovascular Disease

## 2020-01-26 VITALS — BP 188/86 | HR 74 | Ht 61.0 in | Wt 151.8 lb

## 2020-01-26 DIAGNOSIS — E782 Mixed hyperlipidemia: Secondary | ICD-10-CM

## 2020-01-26 DIAGNOSIS — I351 Nonrheumatic aortic (valve) insufficiency: Secondary | ICD-10-CM

## 2020-01-26 DIAGNOSIS — R000 Tachycardia, unspecified: Secondary | ICD-10-CM

## 2020-01-26 DIAGNOSIS — I6529 Occlusion and stenosis of unspecified carotid artery: Secondary | ICD-10-CM | POA: Diagnosis not present

## 2020-01-26 DIAGNOSIS — I10 Essential (primary) hypertension: Secondary | ICD-10-CM

## 2020-01-26 DIAGNOSIS — I6523 Occlusion and stenosis of bilateral carotid arteries: Secondary | ICD-10-CM | POA: Diagnosis not present

## 2020-01-26 NOTE — Progress Notes (Signed)
Hypertension Clinic Initial Assessment:    Date:  02/04/2020   ID:  Emily [name], DOB Jan 06, 1951, MRN [id number]  PCP:  Brunetta Jeans, PA-C  Cardiologist:  No primary care provider on file.  Nephrologist:  Referring MD: Brunetta Jeans, PA-C   CC: Hypertension  History of Present Illness:    Emily [name] is a 69 y.o. female with a hx of CAD s/p CABG, inferior MI s/p OM3 PCI, hypertensionand, hyperlipidemia here to establish care in the hypertension clinic. Emily [name] was first diagnosed with hypertension at the time of her heart attack.  It was previously pretty well controlled until a couple months ago.  She has been working with Dr. Aundra Dubin and losartan was recently switched to lisinopril 12/2019.  She notices that her blood pressure tends to get high when she is agitated.  She is also been under a lot of stress lately.  Her father was in the hospital 2 weeks ago.  She does not get much exercise.  She wants to start back walking but is afraid to do much while her blood pressure still high.  She mostly cooks at home and occasionally eats out.  She tries to limit her sodium intake and has been cutting back on carbs.  She noticed that her blood pressure seems to be highest in the mornings.  She also notices that she gets hot and flushed when her blood pressure gets high.  She denies any headaches or palpitations with it.  She denies chest pain, shortness of breath, lower extremity edema, orthopnea, or PND.  She struggles with insomnia and it has been hard for her to sleep since she stopped taking Benadryl p.m., which she previously took chronically.  She denies snoring or apnea.  She does not feel rested in the morning but attributes this to insomnia.  Previous antihypertensives: Losartan- urinary urgency metoprolol   Past Medical History:  Diagnosis Date  . Anxiety    no anxiety  . CAD (coronary artery disease)   . Cataract   . Chronic insomnia   . Colon polyp   .  Depression   . GERD (gastroesophageal reflux disease)   . GERD with esophagitis   . Heart attack ([place]) 1-09  . Heart murmur   . Hyperlipemia   . Hypertension   . Perirectal abscess   . Shingles 08/31/2017   2nd dx with shingles, per pt    Past Surgical History:  Procedure Laterality Date  . CESAREAN SECTION     x 2  . COLONOSCOPY    . COLONOSCOPY W/ POLYPECTOMY  2007  . INCISION AND DRAINAGE ABSCESS ANAL    . POLYPECTOMY    . PTCA with stent to third obtuse marginal    . STOMACH SURGERY      Current Medications: Current Meds  Medication Sig  . Acetylcarnitine HCl (ACETYL L-CARNITINE) 250 MG CAPS Take by mouth. 200 mg every other day  . ALPRAZolam (XANAX) 0.25 MG tablet TAKE 1 TO 1 & 1/2 TABLETS BY MOUTH AT BEDTIME AS NEEDED **NO SUB GREENSTONE BRAND** (Patient taking differently: TAKE 1 TO 1 & 1/2 TABLETS BY MOUTH AT BEDTIME AS NEEDED **NO SUB GREENSTONE BRAND** **Pt takes if BP goes above 354 systolic*)  . atorvastatin (LIPITOR) 80 MG tablet TAKE 1 TABLET BY MOUTH EVERY DAY  . b complex vitamins tablet Take 1 tablet by mouth daily.    . Biotin 300 MCG TABS Take 600 mcg by mouth every other day.    Marland Kitchen  Calcium-Magnesium-Zinc 500-250-12.5 MG TABS Take 1 tablet by mouth every other day.    . Chromium Picolinate 200 MCG TABS Take 1 tablet by mouth every other day.    . clopidogrel (PLAVIX) 75 MG tablet TAKE 1 TABLET BY MOUTH EVERY DAY, NEED APPOINTMENT FOR FUTURE REFILLS  . Coenzyme Q10 (COQ10) 200 MG CAPS Take 1 capsule by mouth daily.    Marland Kitchen CRANBERRY JUICE EXTRACT PO Take 500 mg by mouth daily.    Marland Kitchen EPINEPHrine 0.3 mg/0.3 mL IJ SOAJ injection INJECT 0.3 MLS INTO THE MUSCLE ONCE FOR 1 DOSE  . famotidine (PEPCID AC) 10 MG tablet Take 1 tablet (10 mg total) by mouth 2 (two) times daily. Take  1-2 tablets twice daily  . finasteride (PROSCAR) 5 MG tablet Take 2.5 mg by mouth daily.  . Flaxseed, Linseed, (FLAX SEED OIL) 1000 MG CAPS Take 1,200 mg by mouth daily.    . Garlic Oil 086 MG  TABS Take 1 tablet by mouth daily.    Marland Kitchen lisinopril (ZESTRIL) 20 MG tablet Take 20 mg by mouth as directed. TAKE 35m every morning and 112mevery evening.  . metoprolol succinate (TOPROL-XL) 50 MG 24 hr tablet Take 1 tablet (50 mg total) by mouth daily. Take with or immediately following a meal.  . nitroGLYCERIN (NITROSTAT) 0.4 MG SL tablet Place 1 tablet (0.4 mg total) under the tongue every 5 (five) minutes x 3 doses as needed for chest pain.  . Marland Kitchenmega-3 acid ethyl esters (LOVAZA) 1 g capsule TAKE 1 CAPSULE BY MOUTH EVERY DAY, NEED APPOINTMENT FOR FUTURE REFILLS  . Probiotic Product (ALIGN PO) Take 1 capsule by mouth daily.  . Rhodiola 300 MG CAPS Take 1 capsule by mouth daily.    . Sodium Sulfate-Mag Sulfate-KCl (SUTAB) [id number] TABS Take 1 kit by mouth as directed.  . [DISCONTINUED] lisinopril (ZESTRIL) 20 MG tablet Take 1 tablet (20 mg total) by mouth daily. (Patient taking differently: Take 10 mg by mouth in the morning and at bedtime. )     Allergies:   Benzonatate, Chlorpromazine hcl, Codeine, Doxycycline, Hydrocodone, Hydrocodone-acetaminophen, Lovaza [omega-3-acid ethyl esters], Penicillins, Prochlorperazine edisylate, Promethazine hcl, and Sulfonamide derivatives   Social History   Socioeconomic History  . Marital status: Married    Spouse name: Not on file  . Number of children: 2  . Years of education: Not on file  . Highest education level: Not on file  Occupational History  . Occupation: hoRisk managerTobacco Use  . Smoking status: Former Smoker    Packs/day: 2.00    Years: 20.00    Pack years: 40.00    Types: Cigarettes    Quit date: 06/19/2006    Years since quitting: 13.6  . Smokeless tobacco: Never Used  Vaping Use  . Vaping Use: Never used  Substance and Sexual Activity  . Alcohol use: Not Currently    Alcohol/week: 0.0 standard drinks    Comment: <2 monthly; rare  . Drug use: No  . Sexual activity: Not on file  Other Topics Concern  . Not on  file  Social History Narrative   [place].    [place] three [place] a year   2 children who go to [place] Just got accepted to [employer] Exercise- NO   Social Determinants of Health   Financial Resource Strain:   . Difficulty of Paying Living Expenses:   Food Insecurity:   .  Worried About Charity fundraiser in the Last Year:   . Arboriculturist in the Last Year:   Transportation Needs:   . Film/video editor (Medical):   Marland Kitchen Lack of Transportation (Non-Medical):   Physical Activity:   . Days of Exercise per Week:   . Minutes of Exercise per Session:   Stress:   . Feeling of Stress :   Social Connections:   . Frequency of Communication with Friends and Family:   . Frequency of Social Gatherings with Friends and Family:   . Attends Religious Services:   . Active Member of Clubs or Organizations:   . Attends Archivist Meetings:   Marland Kitchen Marital Status:      Family History: The patient's family history includes Allergy (severe) in her father; Heart disease in her father; Leukemia in her father; Renal Disease in her paternal grandfather; Skin cancer in her mother. There is no history of Colon cancer, Colon polyps, Rectal cancer, or Stomach cancer.  ROS:   Please see the history of present illness.    All other systems reviewed and are negative.  EKGs/Labs/Other Studies Reviewed:    EKG:  EKG is not ordered today.  The ekg ordered 01/02/2020 demonstrates sinus bradycardia.  Rate 52 bpm.  Recent Labs: 12/14/2019: Hemoglobin 15.1; Platelets 248 12/16/2019: TSH 3.80 01/14/2020: BUN 12; Creatinine, Ser 0.68; Potassium 3.8; Sodium 142   Recent Lipid Panel    Component Value Date/Time   CHOL 169 01/02/2020 1051   TRIG 108 01/02/2020 1051   HDL 52 01/02/2020 1051   CHOLHDL 3.3 01/02/2020 1051   VLDL 22 01/02/2020 1051   LDLCALC 95 01/02/2020 1051   LDLDIRECT 104.0 01/17/2018 1317    Physical Exam:     VS:  BP (!) 188/86   Pulse 74   Ht '5\' 1"'  (1.549 m)   Wt 151 lb 12.8 oz (68.9 kg)   BMI 28.68 kg/m  , BMI Body mass index is 28.68 kg/m. GENERAL:  Well appearing HEENT: Pupils equal round and reactive, fundi not visualized, oral mucosa unremarkable NECK:  No jugular venous distention, waveform within normal limits, carotid upstroke brisk and symmetric, R carotid bruits, no thyromegaly LYMPHATICS:  No cervical adenopathy LUNGS:  Clear to auscultation bilaterally HEART:  RRR.  PMI not displaced or sustained,S1 and S2 within normal limits, no S3, no S4, no clicks, no rubs, no murmurs ABD:  Flat, positive bowel sounds normal in frequency in pitch, no bruits, no rebound, no guarding, no midline pulsatile mass, no hepatomegaly, no splenomegaly EXT:  2 plus pulses throughout, no edema, no cyanosis no clubbing SKIN:  No rashes no nodules NEURO:  Cranial nerves II through XII grossly intact, motor grossly intact throughout PSYCH:  Cognitively intact, oriented to person place and time  ASSESSMENT:    1. Essential hypertension, benign   2. Tachycardia   3. Carotid stenosis, bilateral   4. Nonrheumatic aortic valve insufficiency   5. Essential hypertension   6. Mixed hyperlipidemia   7. Stenosis of carotid artery, unspecified laterality     PLAN:    # Hypertension:  Emily [name]' blood pressure has been more difficult to control lately.  We will do a work-up for secondary causes.  Given that she has a history of some flushing when her blood pressure gets high we will check plasma catecholamines and metanephrines.  We will also get a sleep study and check renal artery Dopplers.  Continue to limit sodium intake and cook at  home as much as possible.  A sleep study is pending, though she thinks her daytime somnolence is more attributable to her insomnia.  Continue metoprolol.  We will have her take her lisinopril 10 mg twice daily given that it seems to be pretty well-controlled later in the  day and high in the mornings.  She was previously taking it all in the mornings.  Secondary Causes of Hypertension  Medications/Herbal: OCP, steroids, stimulants, antidepressants, weight loss medication, immune suppressants, NSAIDs- rare ibuprofen, sympathomimetics, alcohol-rare, IOXBDZHG-9/9 Coke daily, licorice, ginseng, St. John's wort, chemo (otherwise negative unless bolded above)  Sleep Apnea: sleep study pending. Renal artery stenosis-check renal artery Dopplers Hyperaldosteronism (testing not indicated) Hyper/hypothyroidism: Thyroid normal 11/2019 Pheochromocytoma: tesing as above Cushing's syndrome: (testing not indicated) Coarctation of the aorta (testing not indicated)  # Carotid stenosis:   Bruit noted on exam.  She has mild disease several years ago.  We will repeat carotid Dopplers.  Continue atorvastatin and clopidogrel.  Disposition:    FU with MD/PharmD in 1 month    Medication Adjustments/Labs and Tests Ordered: Current medicines are reviewed at length with the patient today.  Concerns regarding medicines are outlined above.  Orders Placed This Encounter  Procedures  . Metanephrines, plasma  . Catecholamines, Fractionated, Plasma  . VAS US CAROTID   No orders of the defined types were placed in this encounter.    Signed, Skeet Latch, MD  02/04/2020 1:42 PM    [facility]

## 2020-01-26 NOTE — Patient Instructions (Addendum)
Medication Instructions:  TAKE YOUR LISINOPRIL 10 MG TWICE A DAY    Labwork: LABS SOON    Testing/Procedures: Your physician has requested that you have a carotid duplex. This test is an ultrasound of the carotid arteries in your neck. It looks at blood flow through these arteries that supply the brain with blood. Allow one hour for this exam. There are no restrictions or special instructions. TRY TO GET SAME DAY AS RENAL DOPPLER   Follow-Up: 1 MONTH 02/26/2020 AT 2:00 PM   Special Instructions:   MONITOR YOUR BLOOD PRESSURE TWICE A DAY, LOG IN BOOK PROVIDED. BRING BOOK AND MACHINE TO YOUR ONE MONTH FOLLOW UP    DASH Eating Plan DASH stands for "Dietary Approaches to Stop Hypertension." The DASH eating plan is a healthy eating plan that has been shown to reduce high blood pressure (hypertension). It may also reduce your risk for type 2 diabetes, heart disease, and stroke. The DASH eating plan may also help with weight loss. What are tips for following this plan?  General guidelines  Avoid eating more than 2,300 mg (milligrams) of salt (sodium) a day. If you have hypertension, you may need to reduce your sodium intake to 1,500 mg a day.  Limit alcohol intake to no more than 1 drink a day for nonpregnant women and 2 drinks a day for men. One drink equals 12 oz of beer, 5 oz of wine, or 1 oz of hard liquor.  Work with your health care provider to maintain a healthy body weight or to lose weight. Ask what an ideal weight is for you.  Get at least 30 minutes of exercise that causes your heart to beat faster (aerobic exercise) most days of the week. Activities may include walking, swimming, or biking.  Work with your health care provider or diet and nutrition specialist (dietitian) to adjust your eating plan to your individual calorie needs. Reading food labels   Check food labels for the amount of sodium per serving. Choose foods with less than 5 percent of the Daily Value of sodium.  Generally, foods with less than 300 mg of sodium per serving fit into this eating plan.  To find whole grains, look for the word "whole" as the first word in the ingredient list. Shopping  Buy products labeled as "low-sodium" or "no salt added."  Buy fresh foods. Avoid canned foods and premade or frozen meals. Cooking  Avoid adding salt when cooking. Use salt-free seasonings or herbs instead of table salt or sea salt. Check with your health care provider or pharmacist before using salt substitutes.  Do not fry foods. Cook foods using healthy methods such as baking, boiling, grilling, and broiling instead.  Cook with heart-healthy oils, such as olive, canola, soybean, or sunflower oil. Meal planning  Eat a balanced diet that includes: ? 5 or more servings of fruits and vegetables each day. At each meal, try to fill half of your plate with fruits and vegetables. ? Up to 6-8 servings of whole grains each day. ? Less than 6 oz of lean meat, poultry, or fish each day. A 3-oz serving of meat is about the same size as a deck of cards. One egg equals 1 oz. ? 2 servings of low-fat dairy each day. ? A serving of nuts, seeds, or beans 5 times each week. ? Heart-healthy fats. Healthy fats called Omega-3 fatty acids are found in foods such as flaxseeds and coldwater fish, like sardines, salmon, and mackerel.  Limit how much  you eat of the following: ? Canned or prepackaged foods. ? Food that is high in trans fat, such as fried foods. ? Food that is high in saturated fat, such as fatty meat. ? Sweets, desserts, sugary drinks, and other foods with added sugar. ? Full-fat dairy products.  Do not salt foods before eating.  Try to eat at least 2 vegetarian meals each week.  Eat more home-cooked food and less restaurant, buffet, and fast food.  When eating at a restaurant, ask that your food be prepared with less salt or no salt, if possible. What foods are recommended? The items listed may not  be a complete list. Talk with your dietitian about what dietary choices are best for you. Grains Whole-grain or whole-wheat bread. Whole-grain or whole-wheat pasta. Brown rice. Modena Morrow. Bulgur. Whole-grain and low-sodium cereals. Pita bread. Low-fat, low-sodium crackers. Whole-wheat flour tortillas. Vegetables Fresh or frozen vegetables (raw, steamed, roasted, or grilled). Low-sodium or reduced-sodium tomato and vegetable juice. Low-sodium or reduced-sodium tomato sauce and tomato paste. Low-sodium or reduced-sodium canned vegetables. Fruits All fresh, dried, or frozen fruit. Canned fruit in natural juice (without added sugar). Meat and other protein foods Skinless chicken or [place]. Ground chicken or [place]. Pork with fat trimmed off. Fish and seafood. Egg whites. Dried beans, peas, or lentils. Unsalted nuts, nut butters, and seeds. Unsalted canned beans. Lean cuts of beef with fat trimmed off. Low-sodium, lean deli meat. Dairy Low-fat (1%) or fat-free (skim) milk. Fat-free, low-fat, or reduced-fat cheeses. Nonfat, low-sodium ricotta or cottage cheese. Low-fat or nonfat yogurt. Low-fat, low-sodium cheese. Fats and oils Soft margarine without trans fats. Vegetable oil. Low-fat, reduced-fat, or light mayonnaise and salad dressings (reduced-sodium). Canola, safflower, olive, soybean, and sunflower oils. Avocado. Seasoning and other foods Herbs. Spices. Seasoning mixes without salt. Unsalted popcorn and pretzels. Fat-free sweets. What foods are not recommended? The items listed may not be a complete list. Talk with your dietitian about what dietary choices are best for you. Grains Baked goods made with fat, such as croissants, muffins, or some breads. Dry pasta or rice meal packs. Vegetables Creamed or fried vegetables. Vegetables in a cheese sauce. Regular canned vegetables (not low-sodium or reduced-sodium). Regular canned tomato sauce and paste (not low-sodium or reduced-sodium). Regular  tomato and vegetable juice (not low-sodium or reduced-sodium). Angie Fava. Olives. Fruits Canned fruit in a light or heavy syrup. Fried fruit. Fruit in cream or butter sauce. Meat and other protein foods Fatty cuts of meat. Ribs. Fried meat. Berniece Salines. Sausage. Bologna and other processed lunch meats. Salami. Fatback. Hotdogs. Bratwurst. Salted nuts and seeds. Canned beans with added salt. Canned or smoked fish. Whole eggs or egg yolks. Chicken or [place] with skin. Dairy Whole or 2% milk, cream, and half-and-half. Whole or full-fat cream cheese. Whole-fat or sweetened yogurt. Full-fat cheese. Nondairy creamers. Whipped toppings. Processed cheese and cheese spreads. Fats and oils Butter. Stick margarine. Lard. Shortening. Ghee. Bacon fat. Tropical oils, such as coconut, palm kernel, or palm oil. Seasoning and other foods Salted popcorn and pretzels. Onion salt, garlic salt, seasoned salt, table salt, and sea salt. Worcestershire sauce. Tartar sauce. Barbecue sauce. Teriyaki sauce. Soy sauce, including reduced-sodium. Steak sauce. Canned and packaged gravies. Fish sauce. Oyster sauce. Cocktail sauce. Horseradish that you find on the shelf. Ketchup. Mustard. Meat flavorings and tenderizers. Bouillon cubes. Hot sauce and Tabasco sauce. Premade or packaged marinades. Premade or packaged taco seasonings. Relishes. Regular salad dressings. Where to find more information:  National Heart, Lung, and Blood Institute: https://wilson-eaton.com/  American Heart Association: www.heart.org Summary  The DASH eating plan is a healthy eating plan that has been shown to reduce high blood pressure (hypertension). It may also reduce your risk for type 2 diabetes, heart disease, and stroke.  With the DASH eating plan, you should limit salt (sodium) intake to 2,300 mg a day. If you have hypertension, you may need to reduce your sodium intake to 1,500 mg a day.  When on the DASH eating plan, aim to eat more fresh fruits and  vegetables, whole grains, lean proteins, low-fat dairy, and heart-healthy fats.  Work with your health care provider or diet and nutrition specialist (dietitian) to adjust your eating plan to your individual calorie needs. This information is not intended to replace advice given to you by your health care provider. Make sure you discuss any questions you have with your health care provider. Document Released: 05/25/2011 Document Revised: 05/18/2017 Document Reviewed: 05/29/2016 Elsevier Patient Education  2020 [employer].

## 2020-01-27 ENCOUNTER — Inpatient Hospital Stay (HOSPITAL_COMMUNITY)
Admission: RE | Admit: 2020-01-27 | Discharge: 2020-01-27 | Disposition: A | Discharge status: Home / Self Care | Payer: Medicare HMO | Source: Ambulatory Visit

## 2020-01-27 NOTE — Telephone Encounter (Signed)
Patient was seen in office 8/9

## 2020-01-28 ENCOUNTER — Telehealth: Payer: Self-pay | Admitting: Cardiovascular Disease

## 2020-01-28 NOTE — Telephone Encounter (Signed)
Follow up  ° ° °Pt returning call  °

## 2020-01-28 NOTE — Telephone Encounter (Signed)
Called patient- advised of the question on her AVS- she states that she did not know what [facility] was. I advised this was her PharmD appointment.   The other question was regarding the renal US. She states that the informational sheet she has states for her to take some gasx pills, and she is uncomfortable taking those, because she does not have any gas normally.  I advised with her that I was unaware of the instructions for the renal US, but would check with [facility]. Patient advised she was off today and would return tomorrow.   Patient thankful for call back.

## 2020-01-28 NOTE — Telephone Encounter (Signed)
New Message  Pt is calling and is asking to speak with Rip Harbour  She has questions regarding her AVS after Monday's visit and has questions about a renal ultrasound   Pt says her phone screens calls for prank calls and to please say your name and where youre calling from when calling   Please call

## 2020-01-28 NOTE — Telephone Encounter (Signed)
Attempted to contact patient to discuss questions. LVM to call back to discuss.

## 2020-01-29 NOTE — Telephone Encounter (Signed)
Patient called stating a detailed message on VM.

## 2020-01-30 ENCOUNTER — Telehealth: Payer: Self-pay | Admitting: Cardiovascular Disease

## 2020-01-30 NOTE — Telephone Encounter (Signed)
Patient saw Dr. Oval Linsey 8/9 -   Meds were changes from regiment of lisinopril 20mg  QAM and meto succ 50mg  QHS to lisinopril 10mg  BID + meto succ 50mg  QHS. She started this on 8/10  She states this change has not helped her BP Most readings SBP is 150s-170s Lowest SBP(s) are 137, 143, 145  She would like advice on what to do with medications  Advised will send message to Dr. Oval Linsey and [facility] clinic

## 2020-01-30 NOTE — Telephone Encounter (Signed)
     Pt c/o medication issue:  1. Name of Medication:   lisinopril (ZESTRIL) 20 MG tablet    2. How are you currently taking this medication (dosage and times per day)? Take 20 mg by mouth as directed. TAKE 1/2 TABLET TWICE A DAY  3. Are you having a reaction (difficulty breathing--STAT)?   4. What is your medication issue? Pt said her BP been elevated since Dr. Oval Linsey changed her lisinopril. She said her BP been 336-122E systolic, she also said, if she did not hear from anyone today she will go back to taking 1 pill in the morning and take the metoprolol in the evening

## 2020-01-30 NOTE — Telephone Encounter (Signed)
INCREASE Lisinopril to 20mg  every morning and 10mg  every evening. Continue metoprolol succinate 50mg  as prescribed.   Call back on 8/16 to discuss any further readings or questions if needed.

## 2020-02-02 ENCOUNTER — Telehealth: Payer: Self-pay | Admitting: Pharmacist

## 2020-02-02 NOTE — Telephone Encounter (Signed)
BP better in the last couple of days after it was unstable during the weekend. Patient is feeling better as well.  Most of her numbers in the 120s. No additional adjustments at this.  Next appt scheduled for 02/26/2020.

## 2020-02-03 ENCOUNTER — Other Ambulatory Visit (HOSPITAL_COMMUNITY): Payer: Self-pay | Admitting: Cardiovascular Disease

## 2020-02-03 ENCOUNTER — Ambulatory Visit (HOSPITAL_COMMUNITY)
Admission: RE | Admit: 2020-02-03 | Discharge: 2020-02-03 | Disposition: A | Discharge status: Home / Self Care | Payer: Medicare HMO | Source: Ambulatory Visit | Attending: Cardiovascular Disease | Admitting: Cardiovascular Disease

## 2020-02-03 ENCOUNTER — Other Ambulatory Visit (HOSPITAL_COMMUNITY): Payer: Self-pay | Admitting: Cardiology

## 2020-02-03 ENCOUNTER — Other Ambulatory Visit: Payer: Self-pay

## 2020-02-03 DIAGNOSIS — I10 Essential (primary) hypertension: Secondary | ICD-10-CM | POA: Insufficient documentation

## 2020-02-03 DIAGNOSIS — I2510 Atherosclerotic heart disease of native coronary artery without angina pectoris: Secondary | ICD-10-CM | POA: Insufficient documentation

## 2020-02-03 NOTE — Telephone Encounter (Signed)
This was a question regarding renal ultrasound today but she had discussed with Falecha  No further questions at this time

## 2020-02-03 NOTE — Telephone Encounter (Signed)
Spoke with patient and her SBP running mostly in the 120's, she will continue to monitor

## 2020-02-04 ENCOUNTER — Encounter: Payer: Self-pay | Admitting: Cardiovascular Disease

## 2020-02-04 ENCOUNTER — Telehealth: Payer: Self-pay | Admitting: *Deleted

## 2020-02-04 DIAGNOSIS — I701 Atherosclerosis of renal artery: Secondary | ICD-10-CM

## 2020-02-04 NOTE — Telephone Encounter (Signed)
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 Recommend that she be seen by Dr. Gwenlyn Found who specializes in opening arteries to the kidneys and other arteries. This may make it easier to control her BP.

## 2020-02-04 NOTE — Telephone Encounter (Signed)
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  Recommend that she be seen by Dr. Gwenlyn Found who specializes in opening arteries to the kidneys and other arteries.  This may make it easier to control her BP.

## 2020-02-04 NOTE — Telephone Encounter (Deleted)
-----   Message from Skeet Latch, MD sent at 02/04/2020  1:44 PM EDT ----- Patient called and said her blood pressure still running high.  Recommend that she increase her lisinopril to 40 mg from 20.

## 2020-02-05 ENCOUNTER — Telehealth: Payer: Self-pay | Admitting: Cardiovascular Disease

## 2020-02-05 NOTE — Telephone Encounter (Signed)
Left message for patient to call and schedule appointmentt with Dr. Gwenlyn Found per 02/04/20 staff message

## 2020-02-10 ENCOUNTER — Ambulatory Visit: Payer: Medicare HMO

## 2020-02-13 NOTE — Telephone Encounter (Signed)
Earvin Hansen, LPN  02/03/5630 4:97 PM EDT     Advised patient and message sent to scheduling

## 2020-02-16 ENCOUNTER — Encounter: Payer: Self-pay | Admitting: Physician Assistant

## 2020-02-16 ENCOUNTER — Ambulatory Visit (INDEPENDENT_AMBULATORY_CARE_PROVIDER_SITE_OTHER): Payer: Medicare HMO | Admitting: Physician Assistant

## 2020-02-16 ENCOUNTER — Other Ambulatory Visit: Payer: Self-pay

## 2020-02-16 VITALS — BP 118/70 | HR 54 | Temp 97.6°F | Resp 16 | Ht 61.0 in | Wt 151.0 lb

## 2020-02-16 DIAGNOSIS — L729 Follicular cyst of the skin and subcutaneous tissue, unspecified: Secondary | ICD-10-CM | POA: Diagnosis not present

## 2020-02-16 DIAGNOSIS — D179 Benign lipomatous neoplasm, unspecified: Secondary | ICD-10-CM | POA: Diagnosis not present

## 2020-02-16 NOTE — Patient Instructions (Signed)
Please make sure to wear shoes with good heel and arch support to relieve pressure from the achilles tendons.   A lot of the areas of concern today are consistent with lipomatous or cystic areas. I am trying to facilitate follow-up with your Dermatologist for this ASAP.   Please make sure to follow-up with Cardiology and Vascular Surgery as scheduled.   Hang in there!

## 2020-02-16 NOTE — Progress Notes (Signed)
Patient presents to clinic today c/o cystic lesions at different areas of her body.  Initially noted of her left outer ankle in July, feeling a very soft, squishy mass.  This was previously diagnosed as a lipoma.  Notes in August having a similar lesion of right ankle and then over the past couple of weeks noting some knots on her arms as well.  States overall they are asymptomatic but they do become slightly tender if she touches or presses on them for a while.  Recently was diagnosed with renal artery stenosis as cause of her hypertension and just had concerned that maybe these areas were related.  Past Medical History:  Diagnosis Date  . Anxiety    no anxiety  . CAD (coronary artery disease)   . Cataract   . Chronic insomnia   . Colon polyp   . Depression   . GERD (gastroesophageal reflux disease)   . GERD with esophagitis   . Heart attack ([facility]) 1-09  . Heart murmur   . Hyperlipemia   . Hypertension   . Perirectal abscess   . Shingles 08/31/2017   2nd dx with shingles, per pt    Current Outpatient Medications on File Prior to Visit  Medication Sig Dispense Refill  . Acetylcarnitine HCl (ACETYL L-CARNITINE) 250 MG CAPS Take by mouth. 200 mg every other day    . ALPRAZolam (XANAX) 0.25 MG tablet TAKE 1 TO 1 & 1/2 TABLETS BY MOUTH AT BEDTIME AS NEEDED **NO SUB GREENSTONE BRAND** (Patient taking differently: TAKE 1 TO 1 & 1/2 TABLETS BY MOUTH AT BEDTIME AS NEEDED **NO SUB GREENSTONE BRAND** **Pt takes if BP goes above 892 systolic*) 30 tablet 1  . atorvastatin (LIPITOR) 80 MG tablet TAKE 1 TABLET BY MOUTH EVERY DAY 90 tablet 1  . b complex vitamins tablet Take 1 tablet by mouth daily.      . Biotin 300 MCG TABS Take 600 mcg by mouth every other day.      . Calcium-Magnesium-Zinc 500-250-12.5 MG TABS Take 1 tablet by mouth every other day.      . Chromium Picolinate 200 MCG TABS Take 1 tablet by mouth every other day.      . clopidogrel (PLAVIX) 75 MG tablet TAKE 1 TABLET BY MOUTH  EVERY DAY, NEED APPOINTMENT FOR FUTURE REFILLS 90 tablet 1  . Coenzyme Q10 (COQ10) 200 MG CAPS Take 1 capsule by mouth daily.      Marland Kitchen CRANBERRY JUICE EXTRACT PO Take 500 mg by mouth daily.      Marland Kitchen EPINEPHrine 0.3 mg/0.3 mL IJ SOAJ injection INJECT 0.3 MLS INTO THE MUSCLE ONCE FOR 1 DOSE  0  . famotidine (PEPCID AC) 10 MG tablet Take 1 tablet (10 mg total) by mouth 2 (two) times daily. Take  1-2 tablets twice daily 180 tablet 3  . finasteride (PROSCAR) 5 MG tablet Take 2.5 mg by mouth daily.    . Flaxseed, Linseed, (FLAX SEED OIL) 1000 MG CAPS Take 1,200 mg by mouth daily.      . Garlic Oil 119 MG TABS Take 1 tablet by mouth daily.      Marland Kitchen lisinopril (ZESTRIL) 20 MG tablet Take 20 mg by mouth as directed. TAKE 51m every morning and 182mevery evening.    . metoprolol succinate (TOPROL-XL) 50 MG 24 hr tablet Take 1 tablet (50 mg total) by mouth daily. Take with or immediately following a meal. 90 tablet 3  . nitroGLYCERIN (NITROSTAT) 0.4 MG SL tablet Place 1  tablet (0.4 mg total) under the tongue every 5 (five) minutes x 3 doses as needed for chest pain. 25 tablet 3  . omega-3 acid ethyl esters (LOVAZA) 1 g capsule TAKE 1 CAPSULE BY MOUTH EVERY DAY, NEED APPOINTMENT FOR FUTURE REFILLS 90 capsule 1  . Probiotic Product (ALIGN PO) Take 1 capsule by mouth daily.    . Rhodiola 300 MG CAPS Take 1 capsule by mouth daily.      . Sodium Sulfate-Mag Sulfate-KCl (SUTAB) [phone] MG TABS Take 1 kit by mouth as directed. 24 tablet 0   No current facility-administered medications on file prior to visit.    Allergies  Allergen Reactions  . Benzonatate     Unknown, patient states "it made me sick"  . Chlorpromazine Hcl     unknown  . Codeine     REACTION: causes vomiting  . Doxycycline     "explosive diarrhea" -does not wish to take this anymore  . Hydrocodone     REACTION: causes vomiing and nausea  . Hydrocodone-Acetaminophen Nausea And Vomiting  . Lovaza [Omega-3-Acid Ethyl Esters] Other (See  Comments)    Loose stools with two capsules  . Penicillins     REACTION: causes rash  . Prochlorperazine Edisylate     unknown   . Promethazine Hcl     unknown  . Sulfonamide Derivatives     unknown     Family History  Problem Relation Age of Onset  . Skin cancer Mother   . Allergy (severe) Father   . Heart disease Father   . Leukemia Father        CML  . Renal Disease Paternal Grandfather        one removed   . Colon cancer Neg Hx   . Colon polyps Neg Hx   . Rectal cancer Neg Hx   . Stomach cancer Neg Hx     Social History   Socioeconomic History  . Marital status: Married    Spouse name: Not on file  . Number of children: 2  . Years of education: Not on file  . Highest education level: Not on file  Occupational History  . Occupation: Risk manager  Tobacco Use  . Smoking status: Former Smoker    Packs/day: 2.00    Years: 20.00    Pack years: 40.00    Types: Cigarettes    Quit date: 06/19/2006    Years since quitting: 13.6  . Smokeless tobacco: Never Used  Vaping Use  . Vaping Use: Never used  Substance and Sexual Activity  . Alcohol use: Not Currently    Alcohol/week: 0.0 standard drinks    Comment: <2 monthly; rare  . Drug use: No  . Sexual activity: Not on file  Other Topics Concern  . Not on file  Social History Narrative   Resides in [place] with her husband.    Husband works in [place] and comes home two to three times a year   2 children who go to [place]   Just got accepted to [employer]   Exercise- NO   Social Determinants of Health   Financial Resource Strain:   . Difficulty of Paying Living Expenses: Not on file  Food Insecurity:   . Worried About Charity fundraiser in the Last Year: Not on file  . Ran Out of Food in the Last Year: Not on file  Transportation Needs:   . Lack of Transportation (Medical): Not on file  . Lack of Transportation (Non-Medical): Not  on file  Physical Activity:   . Days of Exercise per Week: Not on file  .  Minutes of Exercise per Session: Not on file  Stress:   . Feeling of Stress : Not on file  Social Connections:   . Frequency of Communication with Friends and Family: Not on file  . Frequency of Social Gatherings with Friends and Family: Not on file  . Attends Religious Services: Not on file  . Active Member of Clubs or Organizations: Not on file  . Attends Archivist Meetings: Not on file  . Marital Status: Not on file    Review of Systems - See HPI.  All other ROS are negative.  Wt 151 lb (68.5 kg)   BMI 28.53 kg/m   Physical Exam Vitals reviewed.  Constitutional:      Appearance: Normal appearance.  HENT:     Head: Normocephalic and atraumatic.  Cardiovascular:     Rate and Rhythm: Normal rate and regular rhythm.     Pulses: Normal pulses.     Heart sounds: Normal heart sounds.  Pulmonary:     Effort: Pulmonary effort is normal.  Musculoskeletal:     Cervical back: Neck supple.  Skin:      Neurological:     General: No focal deficit present.     Mental Status: She is alert.     Recent Results (from the past 2160 hour(s))  CBC with Differential     Status: Abnormal   Collection Time: 12/14/19  8:20 PM  Result Value Ref Range   WBC 9.0 4.0 - 10.5 K/uL   RBC 5.13 (H) 3.87 - 5.11 MIL/uL   Hemoglobin 15.1 (H) 12.0 - 15.0 g/dL   HCT 46.3 (H) 36 - 46 %   MCV 90.3 80.0 - 100.0 fL   MCH 29.4 26.0 - 34.0 pg   MCHC 32.6 30.0 - 36.0 g/dL   RDW 13.2 11.5 - 15.5 %   Platelets 248 150 - 400 K/uL   nRBC 0.0 0.0 - 0.2 %   Neutrophils Relative % 62 %   Neutro Abs 5.6 1.7 - 7.7 K/uL   Lymphocytes Relative 29 %   Lymphs Abs 2.6 0.7 - 4.0 K/uL   Monocytes Relative 6 %   Monocytes Absolute 0.5 0 - 1 K/uL   Eosinophils Relative 2 %   Eosinophils Absolute 0.2 0 - 0 K/uL   Basophils Relative 1 %   Basophils Absolute 0.1 0 - 0 K/uL   Immature Granulocytes 0 %   Abs Immature Granulocytes 0.02 0.00 - 0.07 K/uL    Comment: Performed at [facility], [address], [place], [place] [address]  Basic metabolic panel     Status: None   Collection Time: 12/14/19  8:20 PM  Result Value Ref Range   Sodium 140 135 - 145 mmol/L   Potassium 3.7 3.5 - 5.1 mmol/L   Chloride 103 98 - 111 mmol/L   CO2 25 22 - 32 mmol/L   Glucose, Bld 97 70 - 99 mg/dL    Comment: Glucose reference range applies only to samples taken after fasting for at least 8 hours.   BUN 11 8 - 23 mg/dL   Creatinine, Ser 0.68 0.44 - 1.00 mg/dL   Calcium 9.1 8.9 - 10.3 mg/dL   GFR calc non Af Amer >60 >60 mL/min   GFR calc Af Amer >60 >60 mL/min   Anion gap 12 5 - 15    Comment: Performed  at [facility], [place] [address] [place], [place] [address]  Troponin I (High Sensitivity)     Status: None   Collection Time: 12/14/19  8:20 PM  Result Value Ref Range   Troponin I (High Sensitivity) 14 <18 ng/L    Comment: (NOTE) Elevated high sensitivity troponin I (hsTnI) values and significant  changes across serial measurements may suggest ACS but many other  chronic and acute conditions are known to elevate hsTnI results.  Refer to the "Links" section for chest pain algorithms and additional  guidance. Performed at [facility], [place] [address] [place], [place] [address]   TSH     Status: None   Collection Time: 12/16/19  2:17 PM  Result Value Ref Range   TSH 3.80 0.35 - 4.50 uIU/mL  Lipid panel     Status: None   Collection Time: 01/02/20 10:51 AM  Result Value Ref Range   Cholesterol 169 0 - 200 mg/dL   Triglycerides 108 <150 mg/dL   HDL 52 >40 mg/dL   Total CHOL/HDL Ratio 3.3 RATIO   VLDL 22 0 - 40 mg/dL   LDL Cholesterol 95 0 - 99 mg/dL    Comment:        Total Cholesterol/HDL:CHD Risk Coronary Heart Disease Risk Table                     Men   Women  1/2 Average Risk   3.4   3.3  Average Risk       5.0   4.4  2 X Average Risk   9.6   7.1  3 X Average Risk  23.4   11.0        Use the calculated Patient Ratio above and the CHD Risk Table to determine the  patient's CHD Risk.        ATP III CLASSIFICATION (LDL):  <100     mg/dL   Optimal  100-129  mg/dL   Near or Above                    Optimal  130-159  mg/dL   Borderline  160-189  mg/dL   High  >190     mg/dL   Very High Performed at [facility] [address], [place], [place] [address]   Basic metabolic panel     Status: Abnormal   Collection Time: 01/14/20 12:48 PM  Result Value Ref Range   Sodium 142 135 - 145 mmol/L   Potassium 3.8 3.5 - 5.1 mmol/L   Chloride 106 98 - 111 mmol/L   CO2 29 22 - 32 mmol/L   Glucose, Bld 102 (H) 70 - 99 mg/dL    Comment: Glucose reference range applies only to samples taken after fasting for at least 8 hours.   BUN 12 8 - 23 mg/dL   Creatinine, Ser 0.68 0.44 - 1.00 mg/dL   Calcium 8.8 (L) 8.9 - 10.3 mg/dL   GFR calc non Af Amer >60 >60 mL/min   GFR calc Af Amer >60 >60 mL/min   Anion gap 7 5 - 15    Comment: Performed at [facility] [address], [place], [place] [address]    Assessment/Plan: 1. Multiple lipomas 2. Subcutaneous cyst As previously noted during physical examination most of these areas seem most consistent with a benign lipoma.  There is one area of her left arm in the olecranon fossa but seems more  consistent with a benign cyst.  Discussed with her do not feel this is anyway related to other issues.  She does have appointment pending with vascular specialist.  We will have her set back up with her dermatologist, Dr. Allyn Kenner for further evaluation of these areas and further reassurance.  Discussed benign nature of lipomas and no cystic lesions.  Reassurance given.  This visit occurred during the SARS-CoV-2 public health emergency.  Safety protocols were in place, including screening questions prior to the visit, additional usage of staff PPE, and extensive cleaning of exam room while observing appropriate contact time as indicated for disinfecting solutions.     Leeanne Rio, PA-C

## 2020-02-18 ENCOUNTER — Other Ambulatory Visit: Payer: Self-pay

## 2020-02-18 ENCOUNTER — Ambulatory Visit (HOSPITAL_COMMUNITY)
Admission: RE | Admit: 2020-02-18 | Discharge: 2020-02-18 | Disposition: A | Discharge status: Home / Self Care | Payer: Medicare HMO | Source: Ambulatory Visit | Attending: Cardiovascular Disease | Admitting: Cardiovascular Disease

## 2020-02-18 ENCOUNTER — Encounter: Payer: Medicare HMO | Admitting: Gastroenterology

## 2020-02-18 DIAGNOSIS — I6523 Occlusion and stenosis of bilateral carotid arteries: Secondary | ICD-10-CM | POA: Insufficient documentation

## 2020-02-20 ENCOUNTER — Telehealth: Payer: Self-pay | Admitting: Cardiovascular Disease

## 2020-02-20 NOTE — Telephone Encounter (Signed)
Left message for pt to call or to call dr mclean's office about getting the echo same day at the hospital when she sees him. Also question of why she would like and echo.

## 2020-02-20 NOTE — Telephone Encounter (Signed)
Spoke with pt, she reports that the last time she saw dr Oval Linsey they thought that she was going to have a carotid and an echo. She is calling to get that scheduled prior to seeing dr Aundra Dubin. Aware there is no mention of echo in dr Le[place]'s note but will forward this to her to review and give okay to order if needed. 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

## 2020-02-20 NOTE — Telephone Encounter (Signed)
I don't recall saying we would get an echo.  Why does she need that?  We did renal dopplers and carotid.  I'll defer an echo to Dr. Aundra Dubin unless there was some reason I'm not remembering.

## 2020-02-20 NOTE — Telephone Encounter (Signed)
New message:      Patient calling requesting a order for a ECHO before her apt with Dr. Aundra Dubin is on 03/08/20. If any question please call patient ok to leave a message.

## 2020-02-20 NOTE — Telephone Encounter (Signed)
Spoke with pt, aware of dr Blenda Mounts recommendations. Aware she may call dr mclean's office, if she needs an echo they maybe able to get it done in the hospital before she sees him.

## 2020-02-26 ENCOUNTER — Ambulatory Visit (INDEPENDENT_AMBULATORY_CARE_PROVIDER_SITE_OTHER): Payer: Medicare HMO | Admitting: Pharmacist

## 2020-02-26 ENCOUNTER — Other Ambulatory Visit: Payer: Self-pay

## 2020-02-26 VITALS — BP 136/80 | HR 60 | Resp 16 | Ht 61.0 in | Wt 152.0 lb

## 2020-02-26 DIAGNOSIS — I10 Essential (primary) hypertension: Secondary | ICD-10-CM

## 2020-02-26 MED ORDER — CANDESARTAN CILEXETIL 8 MG PO TABS: 8.0000 mg | ORAL_TABLET | Freq: Every day | ORAL | 1 refills | Status: DC

## 2020-02-26 NOTE — Patient Instructions (Addendum)
Return for a  follow up appointment in 4 WEEKS  Go to the lab in 2-3 WEEKS  Check your blood pressure at home daily (if able) and keep record of the readings.  Take your BP meds as follows: *STOP taking lisinopril* *CONTINUE taking metoprolol succinate 50mg  daily* *START taking candesartan 8mg  daily*  Bring all of your meds, your BP cuff and your record of home blood pressures to your next appointment.  Exercise as you're able, try to walk approximately 30 minutes per day.  Keep salt intake to a minimum, especially watch canned and prepared boxed foods.  Eat more fresh fruits and vegetables and fewer canned items.  Avoid eating in fast food restaurants.    HOW TO TAKE YOUR BLOOD PRESSURE: . Rest 5 minutes before taking your blood pressure. .  Don't smoke or drink caffeinated beverages for at least 30 minutes before. . Take your blood pressure before (not after) you eat. . Sit comfortably with your back supported and both feet on the floor (don't cross your legs). . Elevate your arm to heart level on a table or a desk. . Use the proper sized cuff. It should fit smoothly and snugly around your bare upper arm. There should be enough room to slip a fingertip under the cuff. The bottom edge of the cuff should be 1 inch above the crease of the elbow. . Ideally, take 3 measurements at one sitting and record the average.

## 2020-02-26 NOTE — Progress Notes (Signed)
Patient ID: Emily [name]                 DOB: April 26, 1951                      MRN: [id number]     HPI: Emily [name] is a 69 y.o. female referred by Dr. Oval Linsey to HTN clinic. PMH includes hyperlipidemia, CAD s/p CABG, and hypertension. She reports improvement in BP and also feeling" better" after increasing lisinopril dose to 42m in AM and 19min PM, but developed persistent unproductive cough. Denies any other issues like dizziness, swelling, headaches, or chest pain.  Current HTN meds:  lisinopril 2065mn AM and 42m25mery evening Metoprolol succinate 50mg58mly  Previously tried:  Losartan - urinary urgency Metoprolol 25mg 58meveloped GERD  BP goal: <130/80  Family History: The patient's family history includes Allergy (severe) in her father; Heart disease in her father; Leukemia in her father; Renal Disease in her paternal grandfather; Skin cancer in her mother. There is no history of Colon cancer, Colon polyps, Rectal cancer, or Stomach cancer.  Social History:  Former smoker, rare alcohol use  Diet: mainly home cooked meals, occassional take out/eat out,   Exercise: activities of daily living; limit her exercise d/t elevated BP. I encouraged her to start light exercise again with 10-15 minutes walks 2 to 3 times per week until follow up.   Home BP readings:  13 readings; average 127/72; no HR reported *Patient has 2 home Bp meters; Eckers wrist -accurate with appropriate technique; [employer] brand arm cuff - not accurate*  Wt Readings from Last 3 Encounters:  02/26/20 152 lb (68.9 kg)  02/16/20 151 lb (68.5 kg)  01/26/20 151 lb 12.8 oz (68.9 kg)   BP Readings from Last 3 Encounters:  02/26/20 136/80  02/16/20 118/70  01/26/20 (!) 188/86   Pulse Readings from Last 3 Encounters:  02/26/20 60  02/16/20 (!) 54  01/26/20 74    Past Medical History:  Diagnosis Date   Anxiety    no anxiety   CAD (coronary artery disease)    Cataract    Chronic  insomnia    Colon polyp    Depression    GERD (gastroesophageal reflux disease)    GERD with esophagitis    Heart attack ([facility]) 1[place]   Heart murmur    Hyperlipemia    Hypertension    Perirectal abscess    Shingles 08/31/2017   2nd dx with shingles, per pt    Current Outpatient Medications on File Prior to Visit  Medication Sig Dispense Refill   Acetylcarnitine HCl (ACETYL L-CARNITINE) 250 MG CAPS Take by mouth. 200 mg every other day     ALPRAZolam (XANAX) 0.25 MG tablet TAKE 1 TO 1 & 1/2 TABLETS BY MOUTH AT BEDTIME AS NEEDED **NO SUB GREENSTONE BRAND** (Patient taking differently: TAKE 1 TO 1 & 1/2 TABLETS BY MOUTH AT BEDTIME AS NEEDED **NO SUB GREENSTONE BRAND** **Pt takes if BP goes above 170 sy778lic*) 30 tablet 1   atorvastatin (LIPITOR) 80 MG tablet TAKE 1 TABLET BY MOUTH EVERY DAY 90 tablet 1   b complex vitamins tablet Take 1 tablet by mouth daily.       Biotin 300 MCG TABS Take 600 mcg by mouth every other day.       Calcium-Magnesium-Zinc 500-250-12.5 MG TABS Take 1 tablet by mouth every other day.       Chromium Picolinate 200 MCG TABS Take 1 tablet  by mouth every other day.       clopidogrel (PLAVIX) 75 MG tablet TAKE 1 TABLET BY MOUTH EVERY DAY, NEED APPOINTMENT FOR FUTURE REFILLS 90 tablet 1   Coenzyme Q10 (COQ10) 200 MG CAPS Take 1 capsule by mouth daily.       CRANBERRY JUICE EXTRACT PO Take 500 mg by mouth daily.       EPINEPHrine 0.3 mg/0.3 mL IJ SOAJ injection INJECT 0.3 MLS INTO THE MUSCLE ONCE FOR 1 DOSE  0   famotidine (PEPCID AC) 10 MG tablet Take 1 tablet (10 mg total) by mouth 2 (two) times daily. Take  1-2 tablets twice daily 180 tablet 3   finasteride (PROSCAR) 5 MG tablet Take 2.5 mg by mouth daily.     Flaxseed, Linseed, (FLAX SEED OIL) 1000 MG CAPS Take 1,200 mg by mouth daily.       Garlic Oil 208 MG TABS Take 1 tablet by mouth daily.       metoprolol succinate (TOPROL-XL) 50 MG 24 hr tablet Take 1 tablet (50 mg total) by mouth  daily. Take with or immediately following a meal. 90 tablet 3   nitroGLYCERIN (NITROSTAT) 0.4 MG SL tablet Place 1 tablet (0.4 mg total) under the tongue every 5 (five) minutes x 3 doses as needed for chest pain. 25 tablet 3   omega-3 acid ethyl esters (LOVAZA) 1 g capsule TAKE 1 CAPSULE BY MOUTH EVERY DAY, NEED APPOINTMENT FOR FUTURE REFILLS 90 capsule 1   Probiotic Product (ALIGN PO) Take 1 capsule by mouth daily.     Rhodiola 300 MG CAPS Take 1 capsule by mouth daily.       Sodium Sulfate-Mag Sulfate-KCl (SUTAB) [phone] MG TABS Take 1 kit by mouth as directed. 24 tablet 0   No current facility-administered medications on file prior to visit.    Allergies  Allergen Reactions   Benzonatate     Unknown, patient states "it made me sick"   Chlorpromazine Hcl     unknown   Codeine     REACTION: causes vomiting   Doxycycline     "explosive diarrhea" -does not wish to take this anymore   Hydrocodone     REACTION: causes vomiing and nausea   Hydrocodone-Acetaminophen Nausea And Vomiting   Lovaza [Omega-3-Acid Ethyl Esters] Other (See Comments)    Loose stools with two capsules   Penicillins     REACTION: causes rash   Prochlorperazine Edisylate     unknown    Promethazine Hcl     unknown   Sulfonamide Derivatives     unknown     Blood pressure 136/80, pulse 60, resp. rate 16, height '5\' 1"'  (1.549 m), weight 152 lb (68.9 kg), SpO2 97 %.  Essential hypertension Blood pressure improved ,but remains slightly above goal during office visit. Note home BP average is appropriate at 127/72, but patient developed dry cough after increasing lisinopril dose.   Will discontinue lisinopril and start candesartan 78m daily. Patient cough should show improvement in 2 weeks. Will continue metoprolol succinate 541mdaily and plan to follow up in 4 weeks and titrate candesartan dose if needed.    Sten Dematteo Rodriguez-Guzman PharmD, BCPS, [id number]/15/2021 4:51 PM

## 2020-03-03 NOTE — Assessment & Plan Note (Addendum)
Blood pressure improved ,but remains slightly above goal during office visit. Note home BP average is appropriate at 127/72, but patient developed dry cough after increasing lisinopril dose.   Will discontinue lisinopril and start candesartan 8mg  daily. Patient cough should show improvement in 2 weeks. Will continue metoprolol succinate 50mg  daily and plan to follow up in 4 weeks and titrate candesartan dose if needed.

## 2020-03-08 ENCOUNTER — Other Ambulatory Visit: Payer: Self-pay

## 2020-03-08 ENCOUNTER — Ambulatory Visit (HOSPITAL_COMMUNITY)
Admission: RE | Admit: 2020-03-08 | Discharge: 2020-03-08 | Disposition: A | Discharge status: Home / Self Care | Payer: Medicare HMO | Source: Ambulatory Visit | Attending: Cardiology | Admitting: Cardiology

## 2020-03-08 VITALS — BP 124/68 | HR 67 | Wt 152.1 lb

## 2020-03-08 DIAGNOSIS — Z79899 Other long term (current) drug therapy: Secondary | ICD-10-CM | POA: Diagnosis not present

## 2020-03-08 DIAGNOSIS — I701 Atherosclerosis of renal artery: Secondary | ICD-10-CM | POA: Diagnosis not present

## 2020-03-08 DIAGNOSIS — K219 Gastro-esophageal reflux disease without esophagitis: Secondary | ICD-10-CM | POA: Diagnosis not present

## 2020-03-08 DIAGNOSIS — I2510 Atherosclerotic heart disease of native coronary artery without angina pectoris: Secondary | ICD-10-CM | POA: Diagnosis not present

## 2020-03-08 DIAGNOSIS — I10 Essential (primary) hypertension: Secondary | ICD-10-CM

## 2020-03-08 DIAGNOSIS — I252 Old myocardial infarction: Secondary | ICD-10-CM | POA: Insufficient documentation

## 2020-03-08 DIAGNOSIS — I351 Nonrheumatic aortic (valve) insufficiency: Secondary | ICD-10-CM

## 2020-03-08 DIAGNOSIS — E785 Hyperlipidemia, unspecified: Secondary | ICD-10-CM | POA: Diagnosis not present

## 2020-03-08 DIAGNOSIS — Z7902 Long term (current) use of antithrombotics/antiplatelets: Secondary | ICD-10-CM | POA: Diagnosis not present

## 2020-03-08 DIAGNOSIS — Z87891 Personal history of nicotine dependence: Secondary | ICD-10-CM | POA: Insufficient documentation

## 2020-03-08 LAB — BASIC METABOLIC PANEL
Anion gap: 8 (ref 5–15)
BUN: 17 mg/dL (ref 8–23)
CO2: 27 mmol/L (ref 22–32)
Calcium: 9.2 mg/dL (ref 8.9–10.3)
Chloride: 106 mmol/L (ref 98–111)
Creatinine, Ser: 0.65 mg/dL (ref 0.44–1.00)
GFR calc Af Amer: >60 mL/min (ref >60)
GFR calc non Af Amer: >60 mL/min (ref >60)
Glucose, Bld: 130 mg/dL — ABNORMAL HIGH (ref 70–99)
Potassium: 3.8 mmol/L (ref 3.5–5.1)
Sodium: 141 mmol/L (ref 135–145)

## 2020-03-08 NOTE — Patient Instructions (Signed)
Labs done today, your results will be available in MyChart, we will contact you for abnormal readings.  Your physician recommends that you schedule a follow-up appointment in: 3 months with echocardiogram  If you have any questions or concerns before your next appointment please send [place] a message through [place] or call our office at [phone].    TO LEAVE A MESSAGE FOR THE NURSE SELECT OPTION 2, PLEASE LEAVE A MESSAGE INCLUDING: . YOUR NAME . DATE OF BIRTH . CALL BACK NUMBER . REASON FOR CALL**this is important as we prioritize the call backs  YOU WILL RECEIVE A CALL BACK THE SAME DAY AS LONG AS YOU CALL BEFORE 4:00 PM

## 2020-03-08 NOTE — Progress Notes (Signed)
Date:  03/08/2020   ID:  [name], DOB May 23, 1951, MRN [id number]   Provider location: [place] [facility] Type of Visit: Established patient   PCP:  Brunetta Jeans, PA-C  Cardiologist:  Dr. Aundra Dubin   History of Present Illness: Emily [name] is a 69 y.o. female who has a history of CAD s/p inferior MI in 1/09 treated with PCI to [facility].  She has had no cardiac events since that time.  Echo in 9/16 showed EF 55-60% with mild aortic insufficiency.   With elevated BP readings, she was referred to HTN clinic.  Her BP is now controlled on Toprol XL + candesartan.  Renal artery dopplers in 8/21 were concerning for significant left renal artery stenosis.    She presents today for followup of CAD and HTN.  Good BP today, readings at home generally show SBP < 130.  No exertional chest pain.  She has heartburn-type symptoms that are clearly related to certain types of food (tomato sauce, chocolate).  No exertional dyspnea.  No lightheadedness or palpitations.    Labs (12/14): LDL 90, HDL 41 Labs (6/15): LDL 106, HDL 48 Labs (7/16): LDL 102 Labs (2/17): K 4.4, creatinine 0.65, LDL 123, HDL 49, TGs 266 Labs (8/17): LDL 85, TGs 165, HDL 52 Labs (9/18): K 3.4, creatinine 0.66 Labs (1/19): LDL 101, HDL 50 Labs (8/19): LDL 104 Labs (1/20): K 3.7, creatinine 0.65 Labs (6/21): K 3.7, creatinine 0.68, hgb 15, TSH nl Labs (7/21): K 3.8, creatinine 0.68, LDL 91, TGs 108  ECG (personally reviewed): NSR 52  PMH: 1. CAD: Inferior MI in 1/09 with culprit vessel a large OM3.  This was treated with PCI.  Had residual 50-70% mLAD stenosis.  Echo (3/14) with EF 60-65%, mild-moderate AI - Echo (9/16): EF 55-60%, inferoseptal akinesis, mild AI.  2. HTN - Renal artery dopplers (8/21): >60% left renal artery stenosis.  3. H/o fen-phen use 4. Aortic insufficiency: Mild to moderate on echo in 3/14. Mild on 9/16 echo.  5. Carotid dopplers (9/12) with mild plaque.  - Carotid  dopplers (9/21): mild BICA stenosis.  6. Hyperlipidemia 7. GERD  Current Outpatient Medications  Medication Sig Dispense Refill  . Acetylcarnitine HCl (ACETYL L-CARNITINE) 250 MG CAPS Take by mouth. 200 mg every other day    . ALPRAZolam (XANAX) 0.25 MG tablet TAKE 1 TO 1 & 1/2 TABLETS BY MOUTH AT BEDTIME AS NEEDED **NO SUB GREENSTONE BRAND** (Patient taking differently: TAKE 1 TO 1 & 1/2 TABLETS BY MOUTH AT BEDTIME AS NEEDED **NO SUB GREENSTONE BRAND** **Pt takes if BP goes above 580 systolic*) 30 tablet 1  . atorvastatin (LIPITOR) 80 MG tablet TAKE 1 TABLET BY MOUTH EVERY DAY 90 tablet 1  . b complex vitamins tablet Take 1 tablet by mouth daily.      . Biotin 300 MCG TABS Take 600 mcg by mouth every other day.      . Calcium-Magnesium-Zinc 500-250-12.5 MG TABS Take 1 tablet by mouth every other day.      . candesartan (ATACAND) 8 MG tablet Take 1 tablet (8 mg total) by mouth daily. 30 tablet 1  . Chromium Picolinate 200 MCG TABS Take 1 tablet by mouth every other day.      . clopidogrel (PLAVIX) 75 MG tablet TAKE 1 TABLET BY MOUTH EVERY DAY, NEED APPOINTMENT FOR FUTURE REFILLS 90 tablet 1  . Coenzyme Q10 (COQ10) 200 MG CAPS Take 1 capsule by mouth daily.      Marland Kitchen  CRANBERRY JUICE EXTRACT PO Take 500 mg by mouth daily.      Marland Kitchen EPINEPHrine 0.3 mg/0.3 mL IJ SOAJ injection INJECT 0.3 MLS INTO THE MUSCLE ONCE FOR 1 DOSE  0  . famotidine (PEPCID AC) 10 MG tablet Take 1 tablet (10 mg total) by mouth 2 (two) times daily. Take  1-2 tablets twice daily 180 tablet 3  . finasteride (PROSCAR) 5 MG tablet Take 2.5 mg by mouth daily.    . Flaxseed, Linseed, (FLAX SEED OIL) 1000 MG CAPS Take 1,200 mg by mouth daily.      . Garlic Oil 953 MG TABS Take 1 tablet by mouth daily.      . metoprolol succinate (TOPROL-XL) 50 MG 24 hr tablet Take 1 tablet (50 mg total) by mouth daily. Take with or immediately following a meal. 90 tablet 3  . nitroGLYCERIN (NITROSTAT) 0.4 MG SL tablet Place 1 tablet (0.4 mg total) under  the tongue every 5 (five) minutes x 3 doses as needed for chest pain. 25 tablet 3  . omega-3 acid ethyl esters (LOVAZA) 1 g capsule TAKE 1 CAPSULE BY MOUTH EVERY DAY, NEED APPOINTMENT FOR FUTURE REFILLS 90 capsule 1  . Probiotic Product (ALIGN PO) Take 1 capsule by mouth daily.    . Rhodiola 300 MG CAPS Take 1 capsule by mouth daily.      . Sodium Sulfate-Mag Sulfate-KCl (SUTAB) [phone] MG TABS Take 1 kit by mouth as directed. 24 tablet 0   No current facility-administered medications for this encounter.    Allergies:   Benzonatate, Chlorpromazine hcl, Codeine, Doxycycline, Hydrocodone, Hydrocodone-acetaminophen, Lovaza [omega-3-acid ethyl esters], Penicillins, Prochlorperazine edisylate, Promethazine hcl, and Sulfonamide derivatives   Social History:  The patient  reports that she quit smoking about 13 years ago. Her smoking use included cigarettes. She has a 40.00 pack-year smoking history. She has never used smokeless tobacco. She reports previous alcohol use. She reports that she does not use drugs.   Family History:  The patient's family history includes Allergy (severe) in her father; Heart disease in her father; Leukemia in her father; Renal Disease in her paternal grandfather; Skin cancer in her mother.   ROS:  Please see the history of present illness.   All other systems are personally reviewed and negative.   Exam:   BP 124/68   Pulse 67   Wt 69 kg (152 lb 2 oz)   SpO2 96%   BMI 28.74 kg/m   General: NAD Neck: No JVD, no thyromegaly or thyroid nodule.  Lungs: Clear to auscultation bilaterally with normal respiratory effort. CV: Nondisplaced PMI.  Heart regular S1/S2, no S3/S4, no murmur.  No peripheral edema.  No carotid bruit.  Normal pedal pulses.  Abdomen: Soft, nontender, no hepatosplenomegaly, no distention.  Skin: Intact without lesions or rashes.  Neurologic: Alert and oriented x 3.  Psych: Normal affect. Extremities: No clubbing or cyanosis.  HEENT: Normal.    Recent Labs: 12/14/2019: Hemoglobin 15.1; Platelets 248 12/16/2019: TSH 3.80 03/08/2020: BUN 17; Creatinine, Ser 0.65; Potassium 3.8; Sodium 141  Personally reviewed   Wt Readings from Last 3 Encounters:  03/08/20 69 kg (152 lb 2 oz)  02/26/20 68.9 kg (152 lb)  02/16/20 68.5 kg (151 lb)    ASSESSMENT AND PLAN:  1. CAD: Stable with no ischemic symptoms.  She had PCI in 1/09 in the setting of inferior MI.  She had residual untreated 50-70% mLAD stenosis.   - Continue Toprol XL and statin. - She has been on Plavix  long-term with no evidence for GI bleeding.   2. Hyperlipidemia: Goal LDL < 70.  LDL has been > goal on atorvastatin 80 mg daily. She cannot tolerate Crestor 40 mg daily.  She has not wanted to take Zetia.  We discussed Repatha again, she is open today.  - She is being followed in pharmacy clinic, Repatha should be initiated.   3. Aortic insufficiency: Most recent echo showed only mild aortic insufficiency (had been moderate in the past).   - I will get echo at net appointment in 3 months.  4. HTN: BP suddenly higher this year. Possibly related to left renal artery stenosis.  Currently controlled on Toprol XL and candesartan.  - Continue Toprol XL and candesartan. BMET today.  - She has an appointment with Dr. Gwenlyn Found to discuss renal artery intervention.  - She has a home sleep study, needs to use it.  5. Colon polyps: History of polyps.  She needs a repeat colonoscopy.  Probably best to do this before any renal artery PCI as she would not be able to come off Plavix for a period of time after a stent.   Followup 3 months with echo.   Signed, [name], MD  03/08/2020  [facility] [address] [facility] [place] [address] [phone] (office) [phone] (fax)

## 2020-03-09 DIAGNOSIS — D1724 Benign lipomatous neoplasm of skin and subcutaneous tissue of left leg: Secondary | ICD-10-CM | POA: Diagnosis not present

## 2020-03-09 DIAGNOSIS — D1721 Benign lipomatous neoplasm of skin and subcutaneous tissue of right arm: Secondary | ICD-10-CM | POA: Diagnosis not present

## 2020-03-12 ENCOUNTER — Other Ambulatory Visit: Payer: Self-pay

## 2020-03-12 ENCOUNTER — Encounter: Payer: Self-pay | Admitting: Cardiovascular Disease

## 2020-03-12 ENCOUNTER — Ambulatory Visit: Payer: Medicare HMO | Admitting: Cardiovascular Disease

## 2020-03-12 VITALS — BP 154/82 | HR 55 | Ht 61.0 in | Wt 151.8 lb

## 2020-03-12 DIAGNOSIS — I701 Atherosclerosis of renal artery: Secondary | ICD-10-CM | POA: Diagnosis not present

## 2020-03-12 DIAGNOSIS — I10 Essential (primary) hypertension: Secondary | ICD-10-CM

## 2020-03-12 NOTE — Patient Instructions (Addendum)
Medication Instructions:  No Changes In Medications at this time.  *If you need a refill on your cardiac medications before your next appointment, please call your pharmacy*  Lab Work: BMET- with in 7 days of CTA  If you have labs (blood work) drawn today and your tests are completely normal, you will receive your results only by: Marland Kitchen MyChart Message (if you have MyChart) OR . A paper copy in the mail If you have any lab test that is abnormal or we need to change your treatment, we will call you to review the results.  Testing/Procedures: CTA- of the abdomen   Follow-Up: At [facility], you and your health needs are our priority.  As part of our continuing mission to provide you with exceptional heart care, we have created designated Provider Care Teams.  These Care Teams include your primary Cardiologist (physician) and Advanced Practice Providers (APPs -  Physician Assistants and Nurse Practitioners) who all work together to provide you with the care you need, when you need it.  Your next appointment:   AS NEEDED   The format for your next appointment:   In Person  Provider:   Quay Burow, MD

## 2020-03-12 NOTE — Progress Notes (Signed)
03/12/2020 Emily [name]   06-Jun-1951  [id number]  Primary Physician Brunetta Jeans, PA-C Primary Cardiologist: Lorretta Harp MD Emily [name], [place]  HPI:  Emily [name] is a 69 y.o. mildly overweight married Caucasian female mother of 2 children, grandmother 2 grandchildren who was referred by Dr. Oval Linsey for evaluation of renal vascular hypertension.  She is a retired Research scientist (physical sciences).  Her primary cardiologist is Dr. Aundra Dubin.  Risk factors include 70-pack-year tobacco abuse having quit approximately 13 years ago, treated hypertension and hyperlipidemia.  Her father had stents in his 14s.  She is never had a stroke but did have a myocardial infarction back 1/09 and had stenting of OM 3 by Dr. Lia Foyer.  She received the shingles vaccine in June and after that her stable blood pressure became more difficult to manage.  She did develop an ACE cough and was switched to Atacand.  Her beta-blocker was titrated.  Her blood pressures at home of been well controlled.  She has been Educated about dietary modification salt restriction.  Renal Doppler studies performed 02/03/2020 revealed symmetric renal dimensions with a suggestion of left renal artery stenosis with a left renal aortic ratio of 4.42.   Current Meds  Medication Sig  . Acetylcarnitine HCl (ACETYL L-CARNITINE) 250 MG CAPS Take by mouth. 200 mg every other day  . ALPRAZolam (XANAX) 0.25 MG tablet TAKE 1 TO 1 & 1/2 TABLETS BY MOUTH AT BEDTIME AS NEEDED **NO SUB GREENSTONE BRAND** (Patient taking differently: TAKE 1 TO 1 & 1/2 TABLETS BY MOUTH AT BEDTIME AS NEEDED **NO SUB GREENSTONE BRAND** **Pt takes if BP goes above 403 systolic*)  . atorvastatin (LIPITOR) 80 MG tablet TAKE 1 TABLET BY MOUTH EVERY DAY  . b complex vitamins tablet Take 1 tablet by mouth daily.    . Biotin 300 MCG TABS Take 600 mcg by mouth every other day.    . Calcium-Magnesium-Zinc 500-250-12.5 MG TABS Take 1 tablet by mouth every other day.    .  candesartan (ATACAND) 8 MG tablet Take 1 tablet (8 mg total) by mouth daily.  . Chromium Picolinate 200 MCG TABS Take 1 tablet by mouth every other day.    . clopidogrel (PLAVIX) 75 MG tablet TAKE 1 TABLET BY MOUTH EVERY DAY, NEED APPOINTMENT FOR FUTURE REFILLS  . Coenzyme Q10 (COQ10) 200 MG CAPS Take 1 capsule by mouth daily.    Marland Kitchen CRANBERRY JUICE EXTRACT PO Take 500 mg by mouth daily.    Marland Kitchen EPINEPHrine 0.3 mg/0.3 mL IJ SOAJ injection INJECT 0.3 MLS INTO THE MUSCLE ONCE FOR 1 DOSE  . famotidine (PEPCID AC) 10 MG tablet Take 1 tablet (10 mg total) by mouth 2 (two) times daily. Take  1-2 tablets twice daily  . finasteride (PROSCAR) 5 MG tablet Take 2.5 mg by mouth daily.  . Flaxseed, Linseed, (FLAX SEED OIL) 1000 MG CAPS Take 1,200 mg by mouth daily.    . Garlic Oil 474 MG TABS Take 1 tablet by mouth daily.    . metoprolol succinate (TOPROL-XL) 50 MG 24 hr tablet Take 1 tablet (50 mg total) by mouth daily. Take with or immediately following a meal.  . nitroGLYCERIN (NITROSTAT) 0.4 MG SL tablet Place 1 tablet (0.4 mg total) under the tongue every 5 (five) minutes x 3 doses as needed for chest pain.  Marland Kitchen omega-3 acid ethyl esters (LOVAZA) 1 g capsule TAKE 1 CAPSULE BY MOUTH EVERY DAY, NEED APPOINTMENT FOR FUTURE REFILLS  . Probiotic Product (ALIGN PO) Take  1 capsule by mouth daily.  . Rhodiola 300 MG CAPS Take 1 capsule by mouth daily.    . Sodium Sulfate-Mag Sulfate-KCl (SUTAB) [phone] MG TABS Take 1 kit by mouth as directed.     Allergies  Allergen Reactions  . Benzonatate     Unknown, patient states "it made me sick"  . Chlorpromazine Hcl     unknown  . Codeine     REACTION: causes vomiting  . Doxycycline     "explosive diarrhea" -does not wish to take this anymore  . Hydrocodone     REACTION: causes vomiing and nausea  . Hydrocodone-Acetaminophen Nausea And Vomiting  . Lisinopril Cough    Patient states that she will no longer take this medication because it gave her a bad cough     . Lovaza [Omega-3-Acid Ethyl Esters] Other (See Comments)    Loose stools with two capsules  . Penicillins     REACTION: causes rash  . Prochlorperazine Edisylate     unknown   . Promethazine Hcl     unknown  . Sulfonamide Derivatives     unknown     Social History   Socioeconomic History  . Marital status: Married    Spouse name: Not on file  . Number of children: 2  . Years of education: Not on file  . Highest education level: Not on file  Occupational History  . Occupation: Risk manager  Tobacco Use  . Smoking status: Former Smoker    Packs/day: 2.00    Years: 20.00    Pack years: 40.00    Types: Cigarettes    Quit date: 06/19/2006    Years since quitting: 13.7  . Smokeless tobacco: Never Used  Vaping Use  . Vaping Use: Never used  Substance and Sexual Activity  . Alcohol use: Not Currently    Alcohol/week: 0.0 standard drinks    Comment: <2 monthly; rare  . Drug use: No  . Sexual activity: Not on file  Other Topics Concern  . Not on file  Social History Narrative   Resides in [place] with her husband.    Husband works in [place] and comes home two to three times a year   2 children who go to [place]   Just got accepted to [employer]   Exercise- NO   Social Determinants of Health   Financial Resource Strain:   . Difficulty of Paying Living Expenses: Not on file  Food Insecurity:   . Worried About Charity fundraiser in the Last Year: Not on file  . Ran Out of Food in the Last Year: Not on file  Transportation Needs:   . Lack of Transportation (Medical): Not on file  . Lack of Transportation (Non-Medical): Not on file  Physical Activity:   . Days of Exercise per Week: Not on file  . Minutes of Exercise per Session: Not on file  Stress:   . Feeling of Stress : Not on file  Social Connections:   . Frequency of Communication with Friends and Family: Not on file  . Frequency of Social Gatherings with Friends and Family: Not on file  . Attends Religious  Services: Not on file  . Active Member of Clubs or Organizations: Not on file  . Attends Archivist Meetings: Not on file  . Marital Status: Not on file  Intimate Partner Violence:   . Fear of Current or Ex-Partner: Not on file  . Emotionally Abused: Not on file  . Physically Abused:  Not on file  . Sexually Abused: Not on file     Review of Systems: General: negative for chills, fever, night sweats or weight changes.  Cardiovascular: negative for chest pain, dyspnea on exertion, edema, orthopnea, palpitations, paroxysmal nocturnal dyspnea or shortness of breath Dermatological: negative for rash Respiratory: negative for cough or wheezing Urologic: negative for hematuria Abdominal: negative for nausea, vomiting, diarrhea, bright red blood per rectum, melena, or hematemesis Neurologic: negative for visual changes, syncope, or dizziness All other systems reviewed and are otherwise negative except as noted above.    Blood pressure (!) 154/82, pulse (!) 55, height _0  (1.549 m), weight 151 lb 12.8 oz (68.9 kg), SpO2 97 %.  General appearance: alert and no distress Neck: no adenopathy, no carotid bruit, no JVD, supple, symmetrical, trachea midline and thyroid not enlarged, symmetric, no tenderness/mass/nodules Lungs: clear to auscultation bilaterally Heart: regular rate and rhythm, S1, S2 normal, no murmur, click, rub or gallop Extremities: extremities normal, atraumatic, no cyanosis or edema Pulses: 2+ and symmetric Skin: Skin color, texture, turgor normal. No rashes or lesions Neurologic: Alert and oriented X 3, normal strength and tone. Normal symmetric reflexes. Normal coordination and gait  EKG sinus bradycardia 55 without ST or T wave changes.  I personally reviewed this EKG.  Urine  ASSESSMENT AND PLAN:   Essential hypertension History of essential hypertension well-controlled until this past June when her blood pressures became more difficult to control after  receiving the shingles vaccine.  She currently is on metoprolol and candesartan.  Blood pressures at home are well controlled.  Renal function is normal.  She did have renal Dopplers performed by Dr. Oval Linsey 02/03/2020 revealing symmetric renal dimensions of approximately 9.5 to 10 cm with a left renal aortic ratio of 4.42 suggestive of a stenosis greater than 50%.  I am suspicious that this may not be reflective of true physiologic renal artery stenosis.  I am going to get an abdominal CTA to further evaluate.      Lorretta Harp MD FACP,FACC,FAHA, [facility] 03/12/2020 1:59 PM

## 2020-03-12 NOTE — Assessment & Plan Note (Signed)
History of essential hypertension well-controlled until this past June when her blood pressures became more difficult to control after receiving the shingles vaccine.  She currently is on metoprolol and candesartan.  Blood pressures at home are well controlled.  Renal function is normal.  She did have renal Dopplers performed by Dr. Oval Linsey 02/03/2020 revealing symmetric renal dimensions of approximately 9.5 to 10 cm with a left renal aortic ratio of 4.42 suggestive of a stenosis greater than 50%.  I am suspicious that this may not be reflective of true physiologic renal artery stenosis.  I am going to get an abdominal CTA to further evaluate.

## 2020-03-14 DIAGNOSIS — I10 Essential (primary) hypertension: Secondary | ICD-10-CM | POA: Diagnosis not present

## 2020-03-14 DIAGNOSIS — G4730 Sleep apnea, unspecified: Secondary | ICD-10-CM | POA: Diagnosis not present

## 2020-03-16 NOTE — Addendum Note (Signed)
Addended by: Zebedee Iba on: 03/16/2020 04:08 PM   Modules accepted: Orders

## 2020-03-24 DIAGNOSIS — I701 Atherosclerosis of renal artery: Secondary | ICD-10-CM | POA: Diagnosis not present

## 2020-03-24 DIAGNOSIS — I10 Essential (primary) hypertension: Secondary | ICD-10-CM | POA: Diagnosis not present

## 2020-03-25 LAB — BASIC METABOLIC PANEL
BUN/Creatinine Ratio: 21 (ref 12–28)
BUN: 14 mg/dL (ref 8–27)
CO2: 27 mmol/L (ref 20–29)
Calcium: 9.6 mg/dL (ref 8.7–10.3)
Chloride: 102 mmol/L (ref 96–106)
Creatinine, Ser: 0.67 mg/dL (ref 0.57–1.00)
GFR calc Af Amer: 104 mL/min/{1.73_m2} (ref >59)
GFR calc non Af Amer: 91 mL/min/{1.73_m2} (ref >59)
Glucose: 103 mg/dL — ABNORMAL HIGH (ref 65–99)
Potassium: 4.4 mmol/L (ref 3.5–5.2)
Sodium: 141 mmol/L (ref 134–144)

## 2020-03-26 ENCOUNTER — Other Ambulatory Visit: Payer: Self-pay

## 2020-03-26 ENCOUNTER — Ambulatory Visit (INDEPENDENT_AMBULATORY_CARE_PROVIDER_SITE_OTHER): Payer: Medicare HMO | Admitting: Pharmacist

## 2020-03-26 VITALS — BP 116/60 | HR 60 | Ht 61.0 in | Wt 154.0 lb

## 2020-03-26 DIAGNOSIS — I10 Essential (primary) hypertension: Secondary | ICD-10-CM | POA: Diagnosis not present

## 2020-03-26 DIAGNOSIS — E782 Mixed hyperlipidemia: Secondary | ICD-10-CM | POA: Diagnosis not present

## 2020-03-26 NOTE — Patient Instructions (Addendum)
Return for a follow up appointment in 4 weeks  Go to the lab in A1C today   Check your blood pressure at home daily (if able) and keep record of the readings.  Take your BP meds as follows: *NO MEDICATION CHANGE*  *Consider the following emdication for Lipid (cholesterol) management*   Nexletol 180mg  (bempedoic acid) - NEW  Repatha SureClick 140mg  every 14 days - injection  Praluent 75mg  every 14 days - injection

## 2020-03-26 NOTE — Progress Notes (Signed)
Patient ID: Emily [name]                 DOB: 1950-11-14                      MRN: [id number]     HPI: Fritzie Prioleau is a 69 y.o. female referred by Dr. Oval Linsey to HTN clinic. PMH includes hyperlipidemia, CAD s/p CABG, and hypertension. We changed her lisinopril to candesartan 5m during last OV after patient developed dry cough with lisinopril 322mdaily.  We breafly talked about lipid management, but we agreed on making only one medication change at a time. Plan to address potential PCSK9i titration today in addition to BP management. Denies any other issues like dizziness, swelling, headaches, or chest pain.  Diabetes in family and nervious about PCSK9i d/t potential to   Current HTN meds:  Candesartan 11m52maily Metoprolol succinate 69m72mily  Current lipid meds: Atorvastatin 80mg27mce 2009  Previously tried:  Losartan - urinary urgency Metoprolol 25mg 27meveloped GERD  Crestor 40mg  90mscle cramps Zetia - weigh gain  BP goal: <130/80  LDL goal: < 70mg/dL82mmily History: The patient's family history includes Allergy (severe) in her father; Heart disease in her father; Leukemia in her father; Renal Disease in her paternal grandfather; Skin cancer in her mother. There is no history of Colon cancer, Colon polyps, Rectal cancer, or Stomach cancer.  Social History:  Former smoker, rare alcohol use  Diet: mainly home cooked meals, occassional take out/eat out,   Exercise: activities of daily living; limit her exercise d/t elevated BP. I encouraged her to start light exercise again with 10-15 minutes walks 2 to 3 times per week until follow up.   Home BP readings:  28 readings since Sept/29: Average 116/74 ; HR range 56 - 72 bpm *Omron arm cuff - determined to be accurate during OV (<10mmHg f92manula reading*  Wt Readings from Last 3 Encounters:  03/26/20 154 lb (69.9 kg)  03/12/20 151 lb 12.8 oz (68.9 kg)  03/08/20 152 lb 2 oz (69 kg)   BP Readings from Last  3 Encounters:  03/26/20 116/60  03/12/20 (!) 154/82  03/08/20 124/68   Pulse Readings from Last 3 Encounters:  03/26/20 60  03/12/20 (!) 55  03/08/20 67    Past Medical History:  Diagnosis Date  . Anxiety    no anxiety  . CAD (coronary artery disease)   . Cataract   . Chronic insomnia   . Colon polyp   . Depression   . GERD (gastroesophageal reflux disease)   . GERD with esophagitis   . Heart attack (HCC) 1-09[place] Heart murmur   . Hyperlipemia   . Hypertension   . Perirectal abscess   . Shingles 08/31/2017   2nd dx with shingles, per pt    Current Outpatient Medications on File Prior to Visit  Medication Sig Dispense Refill  . Acetylcarnitine HCl (ACETYL L-CARNITINE) 250 MG CAPS Take by mouth. 200 mg every other day    . ALPRAZolam (XANAX) 0.25 MG tablet TAKE 1 TO 1 & 1/2 TABLETS BY MOUTH AT BEDTIME AS NEEDED **NO SUB GREENSTONE BRAND** (Patient taking differently: TAKE 1 TO 1 & 1/2 TABLETS BY MOUTH AT BEDTIME AS NEEDED **NO SUB GREENSTONE BRAND** **Pt takes if BP goes above 170 systo250*) 30 tablet 1  . atorvastatin (LIPITOR) 80 MG tablet TAKE 1 TABLET BY MOUTH EVERY DAY 90 tablet 1  . b complex  vitamins tablet Take 1 tablet by mouth daily.      . Biotin 300 MCG TABS Take 600 mcg by mouth every other day.      . Calcium-Magnesium-Zinc 500-250-12.5 MG TABS Take 1 tablet by mouth every other day.      . Chromium Picolinate 200 MCG TABS Take 1 tablet by mouth every other day.      . clopidogrel (PLAVIX) 75 MG tablet TAKE 1 TABLET BY MOUTH EVERY DAY, NEED APPOINTMENT FOR FUTURE REFILLS 90 tablet 1  . Coenzyme Q10 (COQ10) 200 MG CAPS Take 1 capsule by mouth daily.      Marland Kitchen CRANBERRY JUICE EXTRACT PO Take 500 mg by mouth daily.      Marland Kitchen EPINEPHrine 0.3 mg/0.3 mL IJ SOAJ injection INJECT 0.3 MLS INTO THE MUSCLE ONCE FOR 1 DOSE  0  . famotidine (PEPCID AC) 10 MG tablet Take 1 tablet (10 mg total) by mouth 2 (two) times daily. Take  1-2 tablets twice daily 180 tablet 3  . finasteride  (PROSCAR) 5 MG tablet Take 2.5 mg by mouth daily.    . Flaxseed, Linseed, (FLAX SEED OIL) 1000 MG CAPS Take 1,200 mg by mouth daily.      . Garlic Oil 676 MG TABS Take 1 tablet by mouth daily.      . metoprolol succinate (TOPROL-XL) 50 MG 24 hr tablet Take 1 tablet (50 mg total) by mouth daily. Take with or immediately following a meal. 90 tablet 3  . nitroGLYCERIN (NITROSTAT) 0.4 MG SL tablet Place 1 tablet (0.4 mg total) under the tongue every 5 (five) minutes x 3 doses as needed for chest pain. 25 tablet 3  . omega-3 acid ethyl esters (LOVAZA) 1 g capsule TAKE 1 CAPSULE BY MOUTH EVERY DAY, NEED APPOINTMENT FOR FUTURE REFILLS 90 capsule 1  . Probiotic Product (ALIGN PO) Take 1 capsule by mouth daily.    . Rhodiola 300 MG CAPS Take 1 capsule by mouth daily.      . Sodium Sulfate-Mag Sulfate-KCl (SUTAB) [phone] MG TABS Take 1 kit by mouth as directed. 24 tablet 0   No current facility-administered medications on file prior to visit.    Allergies  Allergen Reactions  . Benzonatate     Unknown, patient states "it made me sick"  . Chlorpromazine Hcl     unknown  . Codeine     REACTION: causes vomiting  . Doxycycline     "explosive diarrhea" -does not wish to take this anymore  . Hydrocodone     REACTION: causes vomiing and nausea  . Hydrocodone-Acetaminophen Nausea And Vomiting  . Lisinopril Cough    Patient states that she will no longer take this medication because it gave her a bad cough   . Lovaza [Omega-3-Acid Ethyl Esters] Other (See Comments)    Loose stools with two capsules  . Penicillins     REACTION: causes rash  . Prochlorperazine Edisylate     unknown   . Promethazine Hcl     unknown  . Sulfonamide Derivatives     unknown     Blood pressure 116/60, pulse 60, height '5\' 1"'  (1.549 m), weight 154 lb (69.9 kg), SpO2 95 %.  Essential hypertension Blood pressure well controlled during OV and at home with home BP average of 116/74. Patient denies issues with  current therapy and reports complete resolution of caught developed with ACEI therapy.   Will continue current BP regimen without changes, and follow up as needed.  Hyperlipidemia LDL  remains  above goal for secondary prevention while on high intensity statin therapy. Patient is unable to tolerate rosuvastatin or ezetimibe. Report compliance with atorvastatin 57m daily, and low fat diet. She became very upset while discussing Lipid management therapy, but is willing to consider additional intervention. Still hesitant of PCSK9i d/t potential to increase A1C, and she is already pre-diabetic.  We discussed option of PCSK9i therapy (work on A1C improvement), and start Repatha/Praluent,or start Nexletol 1820mdaily with 2 weeks samples to assess tolerability.  Will repeat A1C today, give some time to patient to read on therapeutic alternatives, and follow up by phone in 2 week to determine final decision. Will process prior-authorization as needed.   Aayla Marrocco Rodriguez-Guzman PharmD, BCPS, CPFancy [id number]/04/2020 4:38 PM

## 2020-03-27 ENCOUNTER — Other Ambulatory Visit: Payer: Self-pay | Admitting: Cardiovascular Disease

## 2020-03-27 LAB — HEMOGLOBIN A1C
Est. average glucose Bld gHb Est-mCnc: 134 mg/dL
Hgb A1c MFr Bld: 6.3 % — ABNORMAL HIGH (ref 4.8–5.6)

## 2020-03-29 ENCOUNTER — Encounter: Payer: Self-pay | Admitting: Pharmacist

## 2020-03-29 ENCOUNTER — Telehealth: Payer: Self-pay | Admitting: Cardiovascular Disease

## 2020-03-29 NOTE — Assessment & Plan Note (Signed)
Blood pressure well controlled during OV and at home with home BP average of 116/74. Patient denies issues with current therapy and reports complete resolution of caught developed with ACEI therapy.   Will continue current BP regimen without changes, and follow up as needed.

## 2020-03-29 NOTE — Assessment & Plan Note (Signed)
LDL remains  above goal for secondary prevention while on high intensity statin therapy. Patient is unable to tolerate rosuvastatin or ezetimibe. Report compliance with atorvastatin 80mg  daily, and low fat diet. She became very upset while discussing Lipid management therapy, but is willing to consider additional intervention. Still hesitant of PCSK9i d/t potential to increase A1C, and she is already pre-diabetic.  We discussed option of PCSK9i therapy (work on A1C improvement), and start Repatha/Praluent,or start Nexletol 180mg  daily with 2 weeks samples to assess tolerability.  Will repeat A1C today, give some time to patient to read on therapeutic alternatives, and follow up by phone in 2 week to determine final decision. Will process prior-authorization as needed.

## 2020-03-29 NOTE — Telephone Encounter (Signed)
Left message on home phone and cell phone voice mail----[employer] has denied the CTA abdomen and pelvis scheduled 03/30/20---we will cancel this study .  We cannot appeal until we receive the "denial " letter from [employer]----requested return confirmation call from patient .

## 2020-03-30 ENCOUNTER — Ambulatory Visit (HOSPITAL_COMMUNITY): Payer: Medicare HMO

## 2020-03-30 ENCOUNTER — Telehealth: Payer: Self-pay | Admitting: Cardiovascular Disease

## 2020-03-30 NOTE — Telephone Encounter (Signed)
Spoke with patient regarding the appointment for CTA abdomen/pelvis scheduled 04/21/20 at [facility]---patient states she had a  "test" at her Urologist office a few year ago that required a "dye" or contrast and she had a horrible burning sensation throughout her entire body.  Will the CT contrast cause the same thing---if so, what can we do the help prevent that.

## 2020-03-30 NOTE — Telephone Encounter (Signed)
Left message for patient to call regarding appointment for CTA Abdomen/Pelvis scheduled Friday 04/16/20 at 4:00pm at [facility]---arrival time is 3:30 pm for check in---NPO 4 hours prior to study.

## 2020-03-31 NOTE — Telephone Encounter (Signed)
Let the testing facility know this.  They have a protocol for patients with allergies.  We can only recommend their protocol

## 2020-03-31 NOTE — Telephone Encounter (Signed)
She'll need the contrast allergy prophylaxis protocol. Have our Pharm D facilitate

## 2020-04-02 NOTE — Telephone Encounter (Signed)
Attempted to contact pt. Unable to leave message as mailbox is not set up.    

## 2020-04-05 NOTE — Telephone Encounter (Signed)
Attempted to contact pt.  Left message to call back.  

## 2020-04-06 ENCOUNTER — Telehealth: Payer: Self-pay

## 2020-04-06 DIAGNOSIS — E782 Mixed hyperlipidemia: Secondary | ICD-10-CM

## 2020-04-06 MED ORDER — PRALUENT 150 MG/ML ~~LOC~~ SOAJ: 150.0000 mg | SUBCUTANEOUS | 11 refills | Status: DC

## 2020-04-06 NOTE — Telephone Encounter (Signed)
Called and lmomed the pt that they can start praluent 150mg  rx sent and instructed the pt to complete fasting lipids in 2 months orders were placed

## 2020-04-07 ENCOUNTER — Telehealth: Payer: Self-pay | Admitting: Cardiovascular Disease

## 2020-04-07 ENCOUNTER — Other Ambulatory Visit: Payer: Self-pay | Admitting: Cardiovascular Disease

## 2020-04-07 NOTE — Telephone Encounter (Signed)
Called and lmomed the pt stating that [facility] and would like to send it somewhere else please call us back asap.

## 2020-04-07 NOTE — Telephone Encounter (Signed)
Attempted to contact pt x 3. Left message to call back 

## 2020-04-07 NOTE — Addendum Note (Signed)
Addended by: Allean Found on: 04/07/2020 01:02 PM   Modules accepted: Orders

## 2020-04-07 NOTE — Telephone Encounter (Signed)
Pt c/o medication issue:  1. Name of Medication: Alirocumab (PRALUENT) 150 MG/ML SOAJ  2. How are you currently taking this medication (dosage and times per day)? Pt has not started yet   3. Are you having a reaction (difficulty breathing--STAT)? no  4. What is your medication issue? Pharmacy states that Ephraim has been discontinued. The pharmacy want to know if the office would prescribe repatha for the patient instead

## 2020-04-07 NOTE — Telephone Encounter (Signed)
Pt called and stated that they didn't wish to go on pcsk9 and are not interested at all I apologized for the confusion and will cancel the rx at the pharmacy

## 2020-04-12 NOTE — Telephone Encounter (Signed)
Attempted to call patient x3. Received busy signal and unable to leave voicemail.

## 2020-04-12 NOTE — Telephone Encounter (Signed)
Ethelwyn is returning Emily [name]'s call. Please advise.

## 2020-04-16 ENCOUNTER — Ambulatory Visit (HOSPITAL_COMMUNITY): Payer: Medicare HMO

## 2020-04-21 ENCOUNTER — Ambulatory Visit (HOSPITAL_COMMUNITY)
Admission: RE | Admit: 2020-04-21 | Discharge: 2020-04-21 | Disposition: A | Discharge status: Home / Self Care | Payer: Medicare HMO | Source: Ambulatory Visit | Attending: Cardiovascular Disease | Admitting: Cardiovascular Disease

## 2020-04-21 ENCOUNTER — Other Ambulatory Visit: Payer: Self-pay

## 2020-04-21 DIAGNOSIS — I701 Atherosclerosis of renal artery: Secondary | ICD-10-CM | POA: Insufficient documentation

## 2020-04-21 DIAGNOSIS — I773 Arterial fibromuscular dysplasia: Secondary | ICD-10-CM | POA: Diagnosis not present

## 2020-04-21 DIAGNOSIS — K449 Diaphragmatic hernia without obstruction or gangrene: Secondary | ICD-10-CM | POA: Diagnosis not present

## 2020-04-21 DIAGNOSIS — I10 Essential (primary) hypertension: Secondary | ICD-10-CM | POA: Insufficient documentation

## 2020-04-21 MED ORDER — IOHEXOL 350 MG/ML SOLN
100.0000 mL | Freq: Once | INTRAVENOUS | Status: AC | PRN
  Administered 2020-04-21: 100 mL via INTRAVENOUS

## 2020-04-23 ENCOUNTER — Telehealth: Payer: Self-pay

## 2020-04-23 NOTE — Telephone Encounter (Signed)
Called patient left message on personal voice mail Dr.Berry wanted me to schedule you a appointment to see him in office to discuss recent ct of abd/pelvis.Advised to call back to schedule appointment.

## 2020-04-28 ENCOUNTER — Telehealth: Payer: Self-pay | Admitting: Pharmacist

## 2020-04-28 NOTE — Telephone Encounter (Signed)
*  Patient will see Dr Gwenlyn Found in 2 weeks to discuss renal  CT angio* Will hold off making any medication changes today.   PCSK9i prior-authorization already approved and waiting for patient to okay sending prescription to pharmacy.  Blood pressure well controlled at home and will follow up with Dr Gwenlyn Found.

## 2020-04-28 NOTE — Telephone Encounter (Signed)
Patient calling to speak with Raquel in regards to her pharmacist appt tomorrow. She does not feel comfortable making any medication changes right now and feel that this appt may be a waste of her and the Raquels time. She would like to discuss this with her and if Raquel feels that it is absolute necessary that she come in tomorrow. Please advise.

## 2020-04-29 ENCOUNTER — Ambulatory Visit: Payer: Medicare HMO

## 2020-05-11 ENCOUNTER — Other Ambulatory Visit: Payer: Self-pay

## 2020-05-11 ENCOUNTER — Encounter: Payer: Self-pay | Admitting: Cardiovascular Disease

## 2020-05-11 ENCOUNTER — Ambulatory Visit: Payer: Medicare HMO | Admitting: Cardiovascular Disease

## 2020-05-11 VITALS — BP 134/64 | HR 61 | Ht 61.0 in | Wt 154.0 lb

## 2020-05-11 DIAGNOSIS — I701 Atherosclerosis of renal artery: Secondary | ICD-10-CM

## 2020-05-11 NOTE — Patient Instructions (Signed)
  Testing/Procedures:  Your physician has requested that you have a renal artery duplex. During this test, an ultrasound is used to evaluate blood flow to the kidneys. Allow one hour for this exam. Do not eat after midnight the day before and avoid carbonated beverages. Take your medications as you usually do.SCHEDULE IN AUGUST 2022     Follow-Up: At [facility], you and your health needs are our priority.  As part of our continuing mission to provide you with exceptional heart care, we have created designated Provider Care Teams.  These Care Teams include your primary Cardiologist (physician) and Advanced Practice Providers (APPs -  Physician Assistants and Nurse Practitioners) who all work together to provide you with the care you need, when you need it.  We recommend signing up for the patient portal called "MyChart".  Sign up information is provided on this After Visit Summary.  MyChart is used to connect with patients for Virtual Visits (Telemedicine).  Patients are able to view lab/test results, encounter notes, upcoming appointments, etc.  Non-urgent messages can be sent to your provider as well.   To learn more about what you can do with MyChart, go to [email or web address].    Your next appointment:   12 month(s)  The format for your next appointment:   In Person  Provider:   Quay Burow, MD

## 2020-05-11 NOTE — Progress Notes (Signed)
Ms.Storlie returns today for follow-up.  She is originally sent to me by Dr. Oval Linsey for treatment of renal vascular hypertension.  She is on 2 antihypertensive medications including Atacand and metoprolol for blood pressures that are well controlled.  She had renal Doppler studies performed 02/03/2020 that revealed a moderately elevated velocity in the mid left renal artery with a left renal aortic ratio of 4.42.  She had symmetric renal dimensions.  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  At this point, I do not think there is indication for intervention given her well-controlled blood pressure on 2 medications.  I suggest follow-up renal Dopplers on annual basis.  I will see her back in 12 months.  Lorretta Harp, M.D., [place], [facility], [place] [address] [place], [place]  [address]  [phone] 05/11/2020 11:47 AM

## 2020-05-12 DIAGNOSIS — Z5321 Procedure and treatment not carried out due to patient leaving prior to being seen by health care provider: Secondary | ICD-10-CM | POA: Diagnosis not present

## 2020-05-12 DIAGNOSIS — H539 Unspecified visual disturbance: Secondary | ICD-10-CM | POA: Diagnosis not present

## 2020-05-14 ENCOUNTER — Other Ambulatory Visit: Payer: Self-pay

## 2020-05-14 ENCOUNTER — Encounter (HOSPITAL_COMMUNITY): Payer: Self-pay | Admitting: Emergency Medicine

## 2020-05-14 ENCOUNTER — Emergency Department (HOSPITAL_COMMUNITY)
Admission: EM | Admit: 2020-05-14 | Discharge: 2020-05-14 | Disposition: A | Discharge status: Home / Self Care | Payer: Medicare HMO | Attending: Emergency Medicine | Admitting: Emergency Medicine

## 2020-05-14 DIAGNOSIS — Z5321 Procedure and treatment not carried out due to patient leaving prior to being seen by health care provider: Secondary | ICD-10-CM | POA: Diagnosis not present

## 2020-05-14 DIAGNOSIS — H539 Unspecified visual disturbance: Secondary | ICD-10-CM | POA: Insufficient documentation

## 2020-05-14 NOTE — ED Notes (Signed)
Patient stated that her husband got her appointment at 2 at the doctors office so she stated that she was leaving

## 2020-05-14 NOTE — ED Triage Notes (Signed)
C/o vision issue since 1700 yesterday. Pt endorses seeing spots in her right eye. VSS. NAD.

## 2020-05-17 ENCOUNTER — Telehealth: Payer: Self-pay | Admitting: Gastroenterology

## 2020-05-17 NOTE — Telephone Encounter (Signed)
I think okay to proceed with colon at [facility] and hold Plavix (she has been on this since 2009) unless any significant change in her health since our last visit. Thanks

## 2020-05-17 NOTE — Telephone Encounter (Signed)
Dr. Havery Moros, pt received cardiac clearance back in June to hold Plavix for 5 days - see 11/26/19 phone note. Ok to proceed with rescheduling colon in the [place]? Please advise, thank you

## 2020-05-20 ENCOUNTER — Other Ambulatory Visit (HOSPITAL_COMMUNITY): Payer: Self-pay | Admitting: Cardiology

## 2020-05-20 NOTE — Telephone Encounter (Signed)
Patient has been scheduled for a pre-visit on 07/27/2020 at 11 am. Pt is scheduled for colonoscopy on 08/10/2020 at 1:30 pm, pt is to arrive at 12:30 pm.

## 2020-05-21 DIAGNOSIS — H04123 Dry eye syndrome of bilateral lacrimal glands: Secondary | ICD-10-CM | POA: Diagnosis not present

## 2020-05-21 DIAGNOSIS — H43811 Vitreous degeneration, right eye: Secondary | ICD-10-CM | POA: Diagnosis not present

## 2020-05-21 DIAGNOSIS — H18413 Arcus senilis, bilateral: Secondary | ICD-10-CM | POA: Diagnosis not present

## 2020-05-21 DIAGNOSIS — H2513 Age-related nuclear cataract, bilateral: Secondary | ICD-10-CM | POA: Diagnosis not present

## 2020-06-21 ENCOUNTER — Ambulatory Visit (HOSPITAL_COMMUNITY): Payer: Medicare HMO

## 2020-06-21 ENCOUNTER — Encounter (HOSPITAL_COMMUNITY): Payer: Medicare HMO | Admitting: Cardiology

## 2020-06-22 ENCOUNTER — Other Ambulatory Visit: Payer: Self-pay | Admitting: Physician Assistant

## 2020-06-22 DIAGNOSIS — F341 Dysthymic disorder: Secondary | ICD-10-CM

## 2020-06-22 NOTE — Telephone Encounter (Signed)
LFD 01/13/20 #30 with 1 refill LOV 02/16/20 NOV none

## 2020-06-23 ENCOUNTER — Other Ambulatory Visit: Payer: Self-pay | Admitting: Cardiovascular Disease

## 2020-07-01 DIAGNOSIS — H903 Sensorineural hearing loss, bilateral: Secondary | ICD-10-CM | POA: Diagnosis not present

## 2020-07-01 DIAGNOSIS — H6123 Impacted cerumen, bilateral: Secondary | ICD-10-CM | POA: Diagnosis not present

## 2020-07-07 DIAGNOSIS — N390 Urinary tract infection, site not specified: Secondary | ICD-10-CM | POA: Diagnosis not present

## 2020-07-07 DIAGNOSIS — I10 Essential (primary) hypertension: Secondary | ICD-10-CM | POA: Diagnosis not present

## 2020-07-14 ENCOUNTER — Telehealth: Payer: Self-pay | Admitting: Physician Assistant

## 2020-07-14 NOTE — Telephone Encounter (Signed)
Left message for patient to schedule Annual Wellness Visit.  Please schedule with Nurse Health Advisor Martha Stanley, RN at [place]  

## 2020-07-20 DIAGNOSIS — L658 Other specified nonscarring hair loss: Secondary | ICD-10-CM | POA: Diagnosis not present

## 2020-07-27 ENCOUNTER — Ambulatory Visit (AMBULATORY_SURGERY_CENTER): Payer: Self-pay | Admitting: *Deleted

## 2020-07-27 ENCOUNTER — Other Ambulatory Visit: Payer: Self-pay

## 2020-07-27 VITALS — Ht 61.0 in | Wt 156.8 lb

## 2020-07-27 DIAGNOSIS — Z86010 Personal history of colonic polyps: Secondary | ICD-10-CM

## 2020-07-27 DIAGNOSIS — Z01818 Encounter for other preprocedural examination: Secondary | ICD-10-CM

## 2020-07-27 MED ORDER — SUTAB 1479-225-188 MG PO TABS: 24.0000 | ORAL_TABLET | ORAL | 0 refills | Status: DC

## 2020-07-27 NOTE — Progress Notes (Signed)
No egg or soy allergy known to patient  No issues with past sedation with any surgeries or procedures No intubation problems in the past  No FH of Malignant Hyperthermia No diet pills per patient No home 02 use per patient  ON blood thinners per patient -pt on Plavix- we have a 5 day hold  Pt denies issues with constipation  No A fib or A flutter  EMMI video to pt or via [place] 19 guidelines implemented in PV today with Pt and RN  Pt is fully vaccinated  for [employer] given to pt in PV today , Code to Pharmacy and  NO PA's for preps discussed with pt In PV today   Due to the COVID-19 pandemic we are asking patients to follow certain guidelines.  Pt aware of COVID protocols and [facility] guidelines

## 2020-08-02 ENCOUNTER — Telehealth: Payer: Self-pay | Admitting: Gastroenterology

## 2020-08-02 NOTE — Telephone Encounter (Signed)
Father in hospice- brother was with father Friday  And so was pt- and brother  tested Positive for Covid Saturday- pt has some drainage with a cough that's new- she will have a covid test Thursday for [place] for her colon next week- instructed pt to avoid contact with father until after Covid testing- if she is positive, we will RS her Colon until not symptomatic  Pt verbalized understanding - will call with further questions

## 2020-08-02 NOTE — Telephone Encounter (Signed)
Patient called said she received a call on Saturday letting her know that her brother has tested positive for Covid-He went to see his father on Wednesday who is in a home and the patient went to see her father on Friday. Seeking advise for her procedure is on 08/10/20.

## 2020-08-05 ENCOUNTER — Other Ambulatory Visit: Payer: Self-pay | Admitting: Gastroenterology

## 2020-08-05 DIAGNOSIS — Z1159 Encounter for screening for other viral diseases: Secondary | ICD-10-CM | POA: Diagnosis not present

## 2020-08-05 LAB — SARS CORONAVIRUS 2 (TAT 6-24 HRS): SARS Coronavirus 2: NEGATIVE

## 2020-08-06 ENCOUNTER — Encounter: Payer: Self-pay | Admitting: Gastroenterology

## 2020-08-09 ENCOUNTER — Telehealth: Payer: Self-pay | Admitting: Gastroenterology

## 2020-08-09 NOTE — Telephone Encounter (Signed)
Per Dr Lyndel Safe- DOD- Pt will need to Rs her 2-22 Colon with [place]  and hold her Plavix x 5 days for her next scheduled colon   Pt Rs to 3-10 Thursday at 130 pm - arrive at 1230 pm -  Pt instructed to hold Plavix x 5 days- starting 3-5 Saturday LAST dose 3-4 Friday - pt verbalized understanding - sent new My Chart instructions and will mail new instructions as well  I canceled 2-22 Colon with Armbr[facility]

## 2020-08-09 NOTE — Telephone Encounter (Signed)
Inbound call from patient stating she forgot to stop taking Plavix medication on Thursday.  She stopped taking it yesterday, 08/08/2020 and today.  Wants to know if that's ok.  Please advise.

## 2020-08-09 NOTE — Telephone Encounter (Signed)
Dr Havery Moros,  Pt forgot to stop her Plavix 2-17 Thursday- she took it until yesterday- her Colon is tomorrow 2-22- she did have a PV - states she " Forgot "   Should she Reschedule  With a 5 day hold ?  Please advise- Thanks Emily [name] PV

## 2020-08-09 NOTE — Telephone Encounter (Signed)
Okay thanks for letting me know

## 2020-08-10 ENCOUNTER — Encounter: Payer: Medicare HMO | Admitting: Gastroenterology

## 2020-08-17 ENCOUNTER — Telehealth (HOSPITAL_COMMUNITY): Payer: Self-pay | Admitting: Pharmacist

## 2020-08-17 NOTE — Telephone Encounter (Signed)
Advanced Heart Failure Patient Advocate Encounter  Prior Authorization for Omega-3 ethyl esters (Lovaza) has been approved.    Effective dates: 06/19/20 through 21/31/22  Emily [name], PharmD, BCPS, BCCP, CPP Heart Failure Clinic Pharmacist [phone]

## 2020-08-17 NOTE — Telephone Encounter (Signed)
Patient Advocate Encounter   Received notification from [place] that prior authorization for Lovaza (omega-3 ethyl esters) is required.   PA submitted on CoverMyMeds Key [id number] Status is pending   Will continue to follow.   Audry Riles, PharmD, BCPS, BCCP, CPP Heart Failure Clinic Pharmacist [phone]

## 2020-08-23 ENCOUNTER — Telehealth: Payer: Self-pay | Admitting: Gastroenterology

## 2020-08-23 NOTE — Telephone Encounter (Signed)
Good afternoon,  Patient calling to cancel procedure scheduled with Dr. Havery Moros for 08/26/2020 at 1:30pm due to her father passing.   Deepest condolences was expressed to patient..  Patient states she will call back to schedule office visit appointment.  Patient states she is interested in having  endoscopy and colonoscopy scheduled together..  Thank you

## 2020-08-23 NOTE — Telephone Encounter (Signed)
Sorry to hear this. She can reschedule at her convenience. Based on the results of her last exam I don't think she needs an EGD for routine surveillance / screening purposes unless she is having new symptoms that are bothering her. Thanks

## 2020-08-26 ENCOUNTER — Encounter: Payer: Medicare HMO | Admitting: Gastroenterology

## 2020-08-31 ENCOUNTER — Encounter: Payer: Self-pay | Admitting: Gastroenterology

## 2020-09-13 ENCOUNTER — Other Ambulatory Visit: Payer: Self-pay | Admitting: Cardiovascular Disease

## 2020-09-13 ENCOUNTER — Other Ambulatory Visit: Payer: Self-pay | Admitting: Physician Assistant

## 2020-09-27 DIAGNOSIS — Z1231 Encounter for screening mammogram for malignant neoplasm of breast: Secondary | ICD-10-CM | POA: Diagnosis not present

## 2020-09-27 DIAGNOSIS — M8580 Other specified disorders of bone density and structure, unspecified site: Secondary | ICD-10-CM | POA: Diagnosis not present

## 2020-09-27 DIAGNOSIS — Z6831 Body mass index (BMI) 31.0-31.9, adult: Secondary | ICD-10-CM | POA: Diagnosis not present

## 2020-09-27 DIAGNOSIS — Z1211 Encounter for screening for malignant neoplasm of colon: Secondary | ICD-10-CM | POA: Diagnosis not present

## 2020-09-27 DIAGNOSIS — Z01419 Encounter for gynecological examination (general) (routine) without abnormal findings: Secondary | ICD-10-CM | POA: Diagnosis not present

## 2020-09-27 DIAGNOSIS — L900 Lichen sclerosus et atrophicus: Secondary | ICD-10-CM | POA: Diagnosis not present

## 2020-09-27 DIAGNOSIS — L293 Anogenital pruritus, unspecified: Secondary | ICD-10-CM | POA: Diagnosis not present

## 2020-10-21 ENCOUNTER — Encounter: Payer: Self-pay | Admitting: Gastroenterology

## 2020-10-21 ENCOUNTER — Ambulatory Visit (INDEPENDENT_AMBULATORY_CARE_PROVIDER_SITE_OTHER): Payer: Medicare HMO | Admitting: Gastroenterology

## 2020-10-21 ENCOUNTER — Telehealth: Payer: Self-pay

## 2020-10-21 VITALS — BP 124/82 | HR 73 | Ht 61.0 in | Wt 158.0 lb

## 2020-10-21 DIAGNOSIS — Z86010 Personal history of colonic polyps: Secondary | ICD-10-CM

## 2020-10-21 DIAGNOSIS — K219 Gastro-esophageal reflux disease without esophagitis: Secondary | ICD-10-CM

## 2020-10-21 DIAGNOSIS — Z7902 Long term (current) use of antithrombotics/antiplatelets: Secondary | ICD-10-CM

## 2020-10-21 MED ORDER — OMEPRAZOLE 20 MG PO CPDR: 20.0000 mg | DELAYED_RELEASE_CAPSULE | Freq: Every day | ORAL | 3 refills | Status: DC | PRN

## 2020-10-21 MED ORDER — SUCRALFATE 1 GM/10ML PO SUSP: 1.0000 g | Freq: Three times a day (TID) | ORAL | 1 refills | Status: AC | PRN

## 2020-10-21 NOTE — Patient Instructions (Addendum)
If you are age 70 or older, your body mass index should be between 23-30. Your Body mass index is 29.85 kg/m. If this is out of the aforementioned range listed, please consider follow up with your Primary Care Provider.  If you are age 77 or younger, your body mass index should be between 19-25. Your Body mass index is 29.85 kg/m. If this is out of the aformentioned range listed, please consider follow up with your Primary Care Provider.   You have been scheduled for a colonoscopy. Please follow written instructions given to you at your visit today.  Please pick up your prep supplies at the pharmacy within the next 1-3 days. If you use inhalers (even only as needed), please bring them with you on the day of your procedure.   Continue Pepcid.  We have sent the following medications to your pharmacy for you to pick up at your convenience: Omeprazole 20 mg: Take once daily as needed  Carafate suspension: Take 10 ml every 8 hours as needed.  You may consider taking daily at bedtime  Thank you for entrusting me with your care and for choosing [facility], Dr. Shelton Cellar

## 2020-10-21 NOTE — Progress Notes (Signed)
HPI :  70 year old female here for a follow-up visit for reflux and history of colon polyps.  She has a history of GERD for several years.  She has had 2 prior EGDs since 2017 showing no evidence of Barrett's esophagus, although had reflux esophagitis previously.  She has try to avoid PPIs if possible, managing with Zantac historically and more recently Pepcid as needed.  She states from November through March her symptoms were much worse than usual, she had a lot of stress in her life including the passing of her father and some other difficulty with her relationship with her brother.  She states reflux episodes were much worse, had regurgitation that bother her a lot more.  She took Pepcid upwards of 60 mg a day at times to help control it.  She had gained weight during this time and she wonders if that is related.  Over the past few months things have been a bit better, she is taking 10 mg of Pepcid every morning and 20 mg every afternoon with an occasional dose in the middle the night if needed.  She does have symptoms of reflux that continue to bother her however multiple days in a week, sometimes can depend on what she is eating.  She does have some clear triggers that bother her.  She thinks she has a history of osteopenia but her last DEXA was normal in 2019 that I can see.  She denies any dysphagia that bothers her.  Her last EGD was in 2019.  She has a history of CAD with a stent in place, on Plavix since 2009.  She denies any cardiopulmonary symptoms.  She had a colonoscopy in 2017 showing 6 adenomas/sessile serrated polyps that were removed.  She wishes to proceed with surveillance colonoscopy.  She denies any blood in her stools or problems with her bowels otherwise.   Prior work-up: EGD 07/30/2015 - grade B esophagitis, squamous papillomas, normal stomach / duodenum. Biopsies showed no evidence of Barrett's.   Colonoscopy 07/30/2015 - showed 6 small polyps removed, adenomas / serrated  polyps, due for recall in 3 years.  EGD 03/19/18 -  - The Z-line was regular. No evidence of Barrett's. Prior esophagitis has since healed - A 2 cm hiatal hernia was present. - A diverticulum with a small opening was found at the gastroesophageal junction. - The exam of the esophagus was otherwise normal. - The entire examined stomach was normal. - The duodenal bulb and second portion of the duodenum were normal.    Past Medical History:  Diagnosis Date  . Allergy   . Anxiety    no anxiety per pt- she wants removed   . CAD (coronary artery disease)   . Cataract    forming   . Chronic insomnia   . Colon polyp   . Depression   . GERD (gastroesophageal reflux disease)   . GERD with esophagitis   . Heart attack ([place]) 1-09  . Heart murmur   . Hyperlipemia   . Hypertension   . Osteopenia   . Perirectal abscess   . Shingles 08/31/2017   2nd dx with shingles, per pt- shingles x 4 - severe rxn to shingles vax      Past Surgical History:  Procedure Laterality Date  . CESAREAN SECTION     x 2  . COLONOSCOPY    . COLONOSCOPY W/ POLYPECTOMY  2007  . INCISION AND DRAINAGE ABSCESS ANAL    . POLYPECTOMY    .  PTCA with stent to third obtuse marginal    . WISDOM TOOTH EXTRACTION     Family History  Problem Relation Age of Onset  . Skin cancer Mother   . Allergy (severe) Father   . Heart disease Father   . Leukemia Father        CML  . Renal Disease Paternal Grandfather        one removed   . Esophageal cancer Maternal Grandfather   . Colon cancer Neg Hx   . Colon polyps Neg Hx   . Rectal cancer Neg Hx   . Stomach cancer Neg Hx    Social History   Tobacco Use  . Smoking status: Former Smoker    Packs/day: 2.00    Years: 20.00    Pack years: 40.00    Types: Cigarettes    Quit date: 06/19/2006    Years since quitting: 14.3  . Smokeless tobacco: Never Used  Vaping Use  . Vaping Use: Never used  Substance Use Topics  . Alcohol use: Not Currently    Alcohol/week:  0.0 standard drinks    Comment: <2 monthly; rare  . Drug use: No   Current Outpatient Medications  Medication Sig Dispense Refill  . Acetylcarnitine HCl (ACETYL L-CARNITINE) 250 MG CAPS Take by mouth. 200 mg every other day    . ALPRAZolam (XANAX) 0.25 MG tablet TAKE 1 TO 1 & 1/2 TABLETS BY MOUTH AT BEDTIME AS NEEDED **NO SUB GREENSTONE BRAND** 30 tablet 1  . atorvastatin (LIPITOR) 80 MG tablet TAKE 1 TABLET BY MOUTH EVERY DAY 90 tablet 1  . b complex vitamins tablet Take 1 tablet by mouth daily.    . Biotin 300 MCG TABS Take 600 mcg by mouth every other day.    . Calcium-Magnesium-Zinc 500-250-12.5 MG TABS Take 1 tablet by mouth every other day.    . candesartan (ATACAND) 8 MG tablet TAKE 1 TABLET BY MOUTH EVERY DAY 90 tablet 2  . Chromium Picolinate 200 MCG TABS Take 1 tablet by mouth every other day.    . clopidogrel (PLAVIX) 75 MG tablet TAKE 1 TABLET BY MOUTH EVERY DAY 90 tablet 1  . Coenzyme Q10 (COQ10) 200 MG CAPS Take 1 capsule by mouth daily.    Marland Kitchen CRANBERRY JUICE EXTRACT PO Take 500 mg by mouth daily.    Marland Kitchen EPINEPHrine 0.3 mg/0.3 mL IJ SOAJ injection INJECT 0.3 MLS INTO THE MUSCLE ONCE FOR 1 DOSE  0  . famotidine (PEPCID AC) 10 MG tablet Take 1 tablet (10 mg total) by mouth 2 (two) times daily. Take  1-2 tablets twice daily (Patient taking differently: Take 10 mg by mouth 2 (two) times daily. Take   1 tab in the am, 1-2 tabs at 8 pm,  and another 1-2 at 3 am) 180 tablet 3  . finasteride (PROSCAR) 5 MG tablet Take 5 mg by mouth daily.    . Flaxseed, Linseed, (FLAX SEED OIL) 1000 MG CAPS Take 1,200 mg by mouth daily.    . Garlic Oil 161 MG TABS Take 1 tablet by mouth daily.    . metoprolol succinate (TOPROL-XL) 50 MG 24 hr tablet Take 1 tablet (50 mg total) by mouth daily. Take with or immediately following a meal. 90 tablet 3  . nitroGLYCERIN (NITROSTAT) 0.4 MG SL tablet Place 1 tablet (0.4 mg total) under the tongue every 5 (five) minutes x 3 doses as needed for chest pain. 25 tablet 3   . omega-3 acid ethyl esters (LOVAZA) 1  g capsule TAKE 1 CAPSULE BY MOUTH EVERY DAY 90 capsule 1  . Probiotic Product (ALIGN PO) Take 1 capsule by mouth daily.    . Rhodiola 300 MG CAPS Take 1 capsule by mouth daily.      Marland Kitchen triamcinolone (NASACORT) 55 MCG/ACT AERO nasal inhaler Nasacort 55 mcg nasal spray aerosol  Spray 2 sprays every day by intranasal route as needed.    . clobetasol ointment (TEMOVATE) 0.05 % as needed.    . Sodium Sulfate-Mag Sulfate-KCl (SUTAB) [phone] MG TABS Take 24 tablets by mouth as directed. MANUFACTURER CODES!! BIN: [id number] PCN: CN GROUP: [id number] MEMBER ID: [id number];[employer] AS SECONDARY INSURANCE ;NO PRIOR AUTHORIZATION (Patient not taking: Reported on 10/21/2020) 24 tablet 0   No current facility-administered medications for this visit.   Allergies  Allergen Reactions  . Benzonatate     Unknown, patient states "it made me sick"  . Chlorpromazine Hcl     unknown  . Codeine     REACTION: causes vomiting  . Doxycycline     "explosive diarrhea" -does not wish to take this anymore  . Hydrocodone     REACTION: causes vomiing and nausea  . Hydrocodone-Acetaminophen Nausea And Vomiting  . Lisinopril Cough    Patient states that she will no longer take this medication because it gave her a bad cough   . Lovaza [Omega-3-Acid Ethyl Esters] Other (See Comments)    Loose stools with two capsules  . Penicillins     REACTION: causes rash  . Prochlorperazine Edisylate     unknown   . Promethazine Hcl     unknown  . Sulfonamide Derivatives     unknown      Review of Systems: All systems reviewed and negative except where noted in HPI.   Lab Results  Component Value Date   WBC 9.0 12/14/2019   HGB 15.1 (H) 12/14/2019   HCT 46.3 (H) 12/14/2019   MCV 90.3 12/14/2019   PLT 248 12/14/2019    Lab Results  Component Value Date   CREATININE 0.67 03/24/2020   BUN 14 03/24/2020   NA 141 03/24/2020   K 4.4 03/24/2020   CL 102 03/24/2020   CO2 27  03/24/2020    Lab Results  Component Value Date   ALT 19 01/17/2018   AST 20 01/17/2018   ALKPHOS 79 01/17/2018   BILITOT 0.8 01/17/2018     Physical Exam: BP 124/82   Pulse 73   Ht 5\' 1"  (1.549 m)   Wt 158 lb (71.7 kg)   BMI 29.85 kg/m  Constitutional: Pleasant,well-developed, female in no acute distress. Neurological: Alert and oriented to person place and time. Psychiatric: Normal mood and affect. Behavior is normal.   ASSESSMENT AND PLAN: 70 year old female here for reassessment following:  GERD History of colon polyps Antiplatelet use  As above has a longstanding history of GERD without evidence of Barrett's historically.  Has been managing with H2 blockers however has had breakthrough symptoms significantly in recent months.  She has no alarm symptoms otherwise.  Recently doing a bit better in light of decrease in life stressors however still having some breakthrough.  Discussed options with her to include medical and interventional options such as TIF etc.  She really wants to avoid PPIs however I do not think H2 blockers are working well enough to control her symptoms and quality of life, especially in recent months.  We had a lengthy discussion about the long-term risks of chronic PPI use, I think she should  at least try to see if this provides a significant enough benefit to her where it would make her worthwhile to take it long-term.  She was agreeable to a short-term trial of omeprazole 20 mg a day as needed to see if it helps more so than the Pepcid.  I otherwise will give her some liquid Carafate to take as needed, particularly nightly which is where she can have some breakthrough.  I do not feel strongly that she needs an EGD right now.  She otherwise is due for routine surveillance colonoscopy.  She does wish to pursue this after discussion of risks and benefits and what it entails.  We will need to get approval to hold her Plavix for 5 days prior to the exam, will  reach out to her cardiologist.  Further recommendations pending her course  Plan: - trial of omeprazole 20mg  / day PRN, after lengthy discussion of risks / benefits as above - add carafate 10cc po q 8 hours PRN, can try qHS for nocturnal symptoms - holding off on EGD right now as outlined - schedule surveillance colonoscopy. Will request approval to hold Plavix for 5 days pre-procedure  [place] Cellar, MD [facility]

## 2020-10-21 NOTE — Telephone Encounter (Signed)
[facility] Pre-operative Risk Assessment     Request for surgical clearance:     Endoscopy Procedure  What type of surgery is being performed?     COLONOSCOPY  When is this surgery scheduled?     12-03-20  What type of clearance is required ?   Pharmacy  Are there any medications that need to be held prior to surgery and how long? PLAVIX 5 DAYS  Practice name and name of physician performing surgery?  [facility]  What is your office phone and fax number?      Phone- [phone]  Fax- [phone]  ATTN: Tia Alert, CMA  Anesthesia type (None, local, MAC, general) ?       MAC  THANK YOU

## 2020-10-22 NOTE — Telephone Encounter (Signed)
I have spoken to patient to advise that per Dr Aundra Dubin, she may hold her Plavix 5 days prior to her upcoming colonoscopy. She verbalizes understanding of this.

## 2020-10-22 NOTE — Telephone Encounter (Signed)
    Emily [name] DOB:  09-13-1950  MRN:  [id number]   Primary Cardiologist: Dr. Aundra Dubin  Chart reviewed as part of pre-operative protocol coverage.   Per Dr. Aundra Dubin, patient can hold plavix 5 days prior to his upcoming colonoscopy with plans to restart as soon as he is cleared to do so by his gastroenterologist.   I will route this recommendation to the requesting party via [place] fax function and remove from pre-op pool.  Please call with questions.  Abigail Butts, PA-C 10/22/2020, 3:53 PM

## 2020-11-09 DIAGNOSIS — Z20822 Contact with and (suspected) exposure to covid-19: Secondary | ICD-10-CM | POA: Diagnosis not present

## 2020-11-10 DIAGNOSIS — U071 COVID-19: Secondary | ICD-10-CM | POA: Diagnosis not present

## 2020-11-19 DIAGNOSIS — U071 COVID-19: Secondary | ICD-10-CM | POA: Diagnosis not present

## 2020-11-23 IMAGING — CT CT CTA ABD/PEL W/CM AND/OR W/O CM
3 of 13 series · 12 of 46 positions shown, 17 images · IV contrast (APPLIED)
Comparison: None.

CLINICAL DATA: 68-year-old with hypertension. Evaluation for renal
artery stenosis.

EXAM:
CTA ABDOMEN AND PELVIS WITH CONTRAST
TECHNIQUE: Multidetector CT imaging of the abdomen and pelvis was performed
using the standard protocol during bolus administration of
intravenous contrast. Multiplanar reconstructed images and MIPs were
obtained and reviewed to evaluate the vascular anatomy.
CONTRAST:  100mL OMNIPAQUE IOHEXOL 350 MG/ML SOLN

[Series 11: venous 5.0 i30f 1 · axial · portal-venous · 0.84mm/px · z∈[+818,+1258]mm · 3 of 89 slices shown, 7 images]
[im 1/89  soft-tissue]
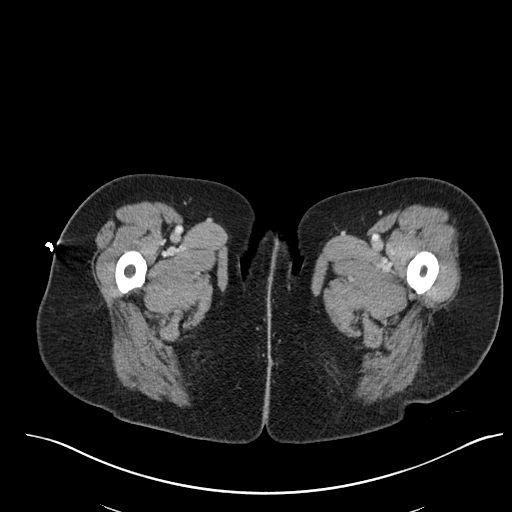
[im 1/89  lung]
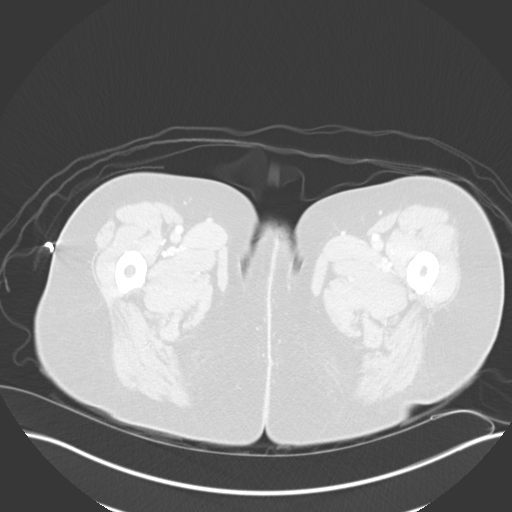
[im 1/89  bone]
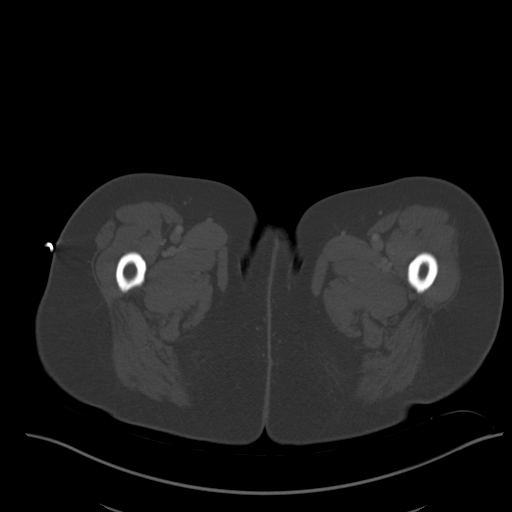
[im 45/89  soft-tissue]
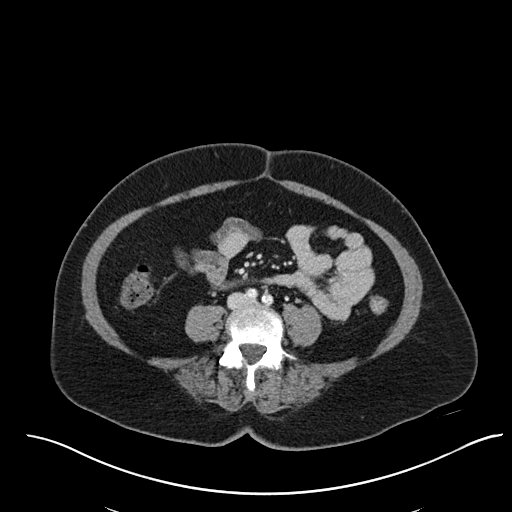
[im 45/89  lung]
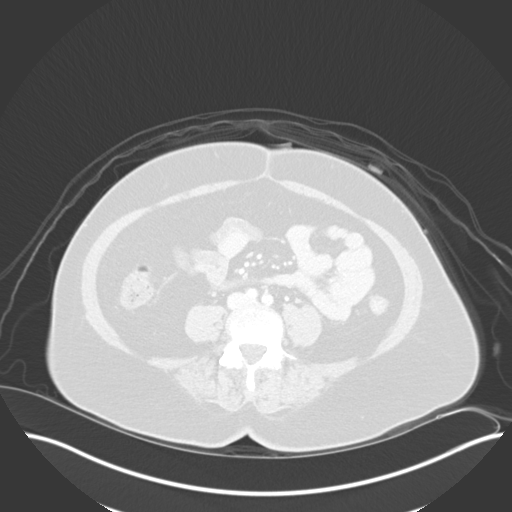
[im 89/89  soft-tissue]
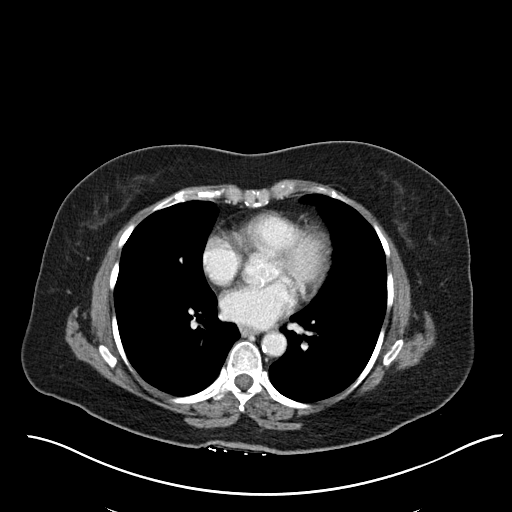
[im 89/89  lung]
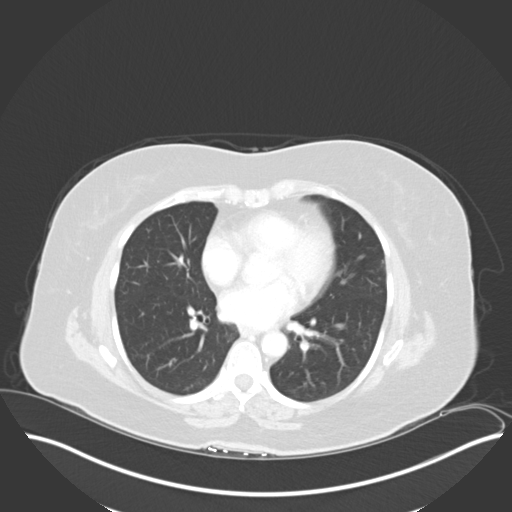

[Series 13: thins · axial · 0.84mm/px · z∈[+846,+1131]mm · 7 of 412 slices shown]
[im 32/412  soft-tissue]
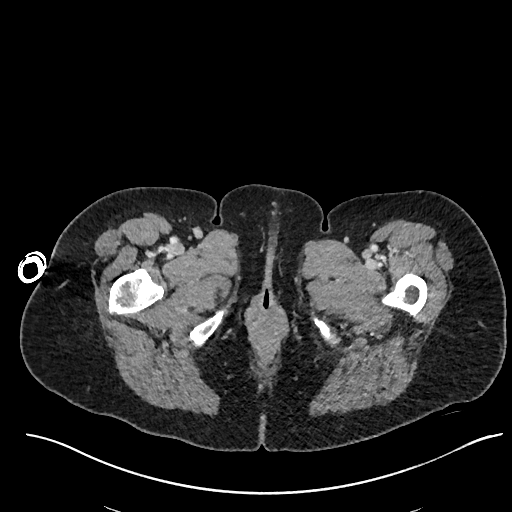
[im 95/412  soft-tissue]
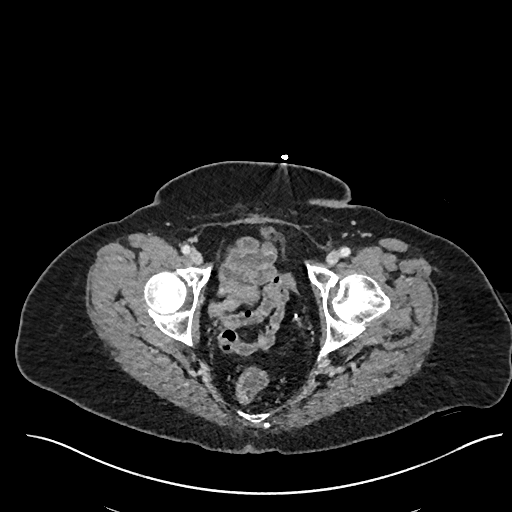
[im 127/412  soft-tissue]
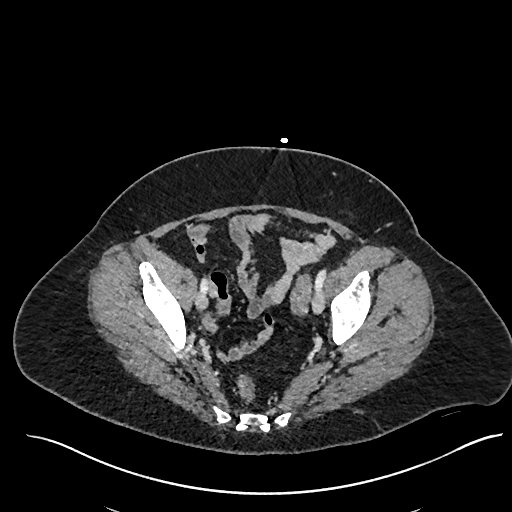
[im 190/412  soft-tissue]
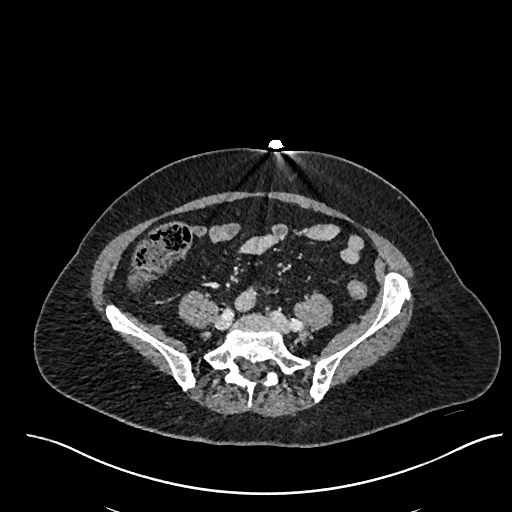
[im 222/412  soft-tissue]
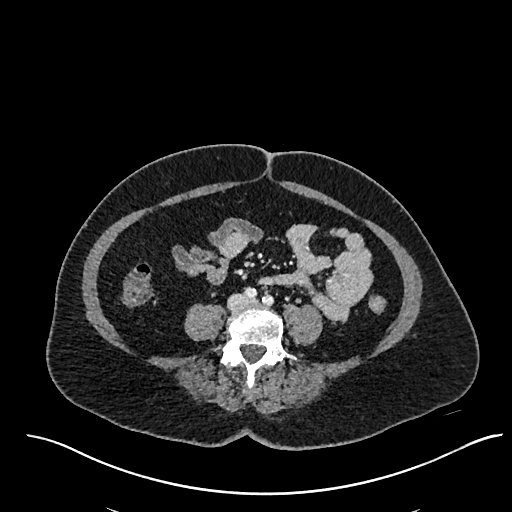
[im 285/412  soft-tissue]
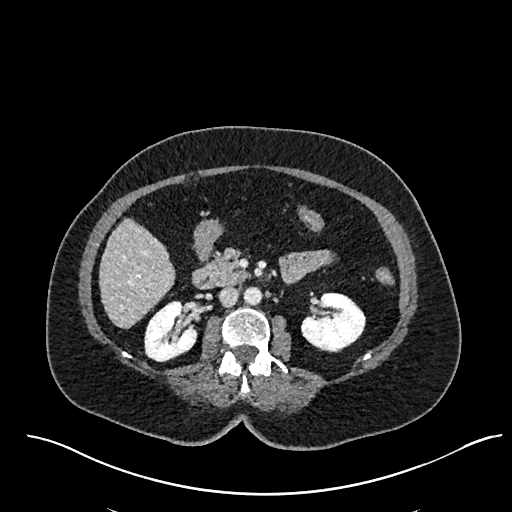
[im 317/412  soft-tissue]
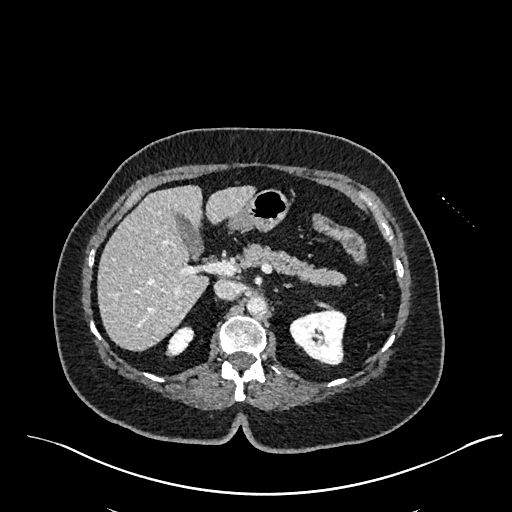

[Series 14: coronals · coronal · 0.72mm/px · 2 of 167 slices shown, 3 images]
[im 56/167  soft-tissue]
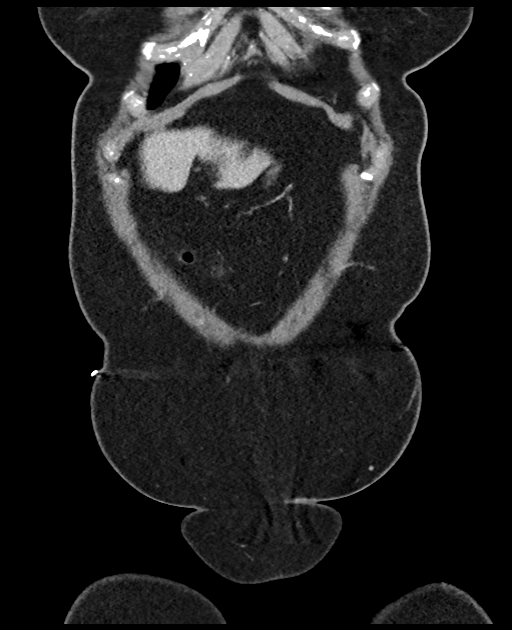
[im 56/167  bone]
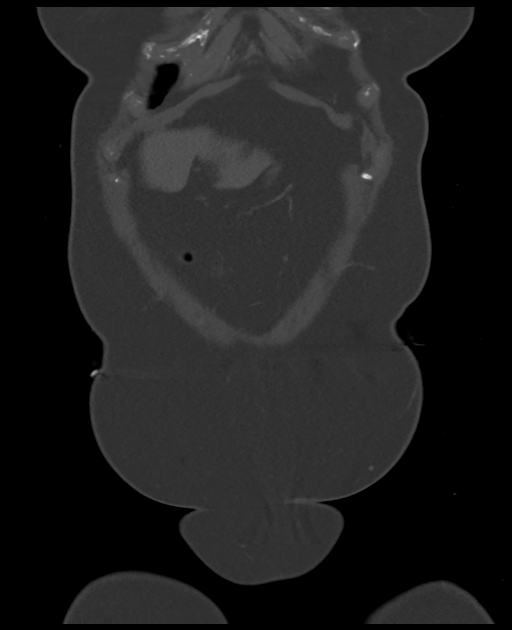
[im 111/167  soft-tissue]
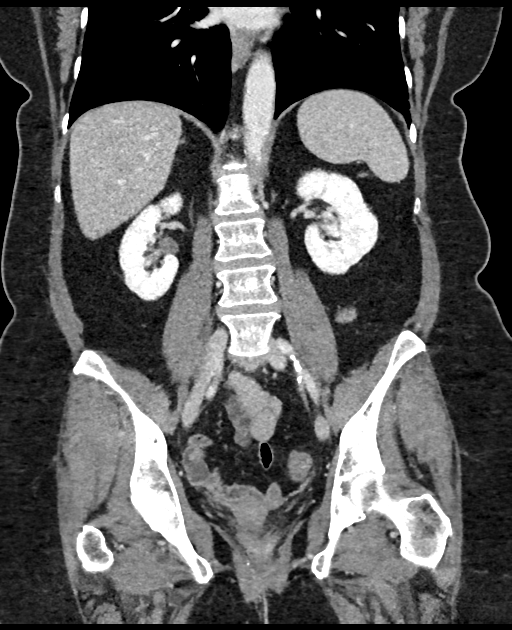

[12 of 46 positions shown; findings below may reference images not displayed]

FINDINGS: VASCULAR

Aorta: Atherosclerotic disease involving the distal descending
thoracic aorta and the abdominal aorta. Negative for aortic
stenosis, aneurysm or dissection.

Celiac: Mild narrowing at the origin of the celiac trunk. The
stenosis does not appear to be hemodynamically significant. Left
gastric artery, splenic artery and common hepatic artery are patent.

SMA: Patent without evidence of aneurysm, dissection, vasculitis or
significant stenosis.

Renals: Single bilateral renal arteries. Both renal arteries have a
beaded configuration and morphology is most compatible with
fibromuscular dysplasia. 50-60% stenosis involving the proximal left
renal artery without significant plaque in this area. No large
aneurysms involving the renal arteries.

IMA: Patent

Inflow: Common iliac arteries are patent bilaterally with mild
atherosclerotic disease. Arterial images were only obtained into the
upper pelvis. However, the external iliac arteries appear to be
patent bilaterally on the delayed images.

Proximal Outflow: Proximal femoral arteries are patent bilaterally.

Veins: Portal venous system is patent. Hepatic veins patent.
Bilateral renal veins are patent. No gross abnormality to the IVC or
iliac veins.

Review of the MIP images confirms the above findings.

NON-VASCULAR

Lower chest: Few linear densities at the left lung base are
suggestive for atelectasis or scarring. No significant pleural
fluid.

Hepatobiliary: Normal appearance of the liver and gallbladder. No
suspicious liver lesion. No biliary dilatation.

Pancreas: Unremarkable. No pancreatic ductal dilatation or
surrounding inflammatory changes.

Spleen: Normal in size without focal abnormality.

Adrenals/Urinary Tract: Normal appearance of both adrenal glands.
Normal appearance of both kidneys without suspicious lesions. No
hydronephrosis. Small hypodensity in the right kidney upper pole is
too small to definitively characterize but likely represents a cyst.
Left kidney measures roughly 9.7 cm in length. Left kidney measures
9.5 cm in length. Kidneys are relatively symmetric in size. Urinary
bladder is decompressed.

Stomach/Bowel: Small hiatal hernia. There may be a small appendix
but poorly characterized. No focal bowel inflammation. No evidence
for bowel obstruction.

Lymphatic: Small lymph nodes scattered throughout the mesentery.
Overall, no significant abdominopelvic lymphadenopathy.

Reproductive: There appears to be a small uterus. No evidence for an
adnexal mass.

Other: Negative for ascites.  Negative for free air.

Musculoskeletal: No acute bone abnormality.
IMPRESSION: VASCULAR

1. Fibromuscular dysplasia involving bilateral renal arteries. At
least 50% stenosis in the proximal left renal artery.
2.  Aortic Atherosclerosis ([id number]).

NON-VASCULAR

1. Kidneys are symmetric in size without hydronephrosis. No
suspicious renal lesions.
2. Small hiatal hernia.

## 2020-11-26 DIAGNOSIS — I10 Essential (primary) hypertension: Secondary | ICD-10-CM | POA: Diagnosis not present

## 2020-11-26 DIAGNOSIS — J019 Acute sinusitis, unspecified: Secondary | ICD-10-CM | POA: Diagnosis not present

## 2020-11-29 ENCOUNTER — Telehealth: Payer: Self-pay | Admitting: Gastroenterology

## 2020-11-29 NOTE — Telephone Encounter (Signed)
Spoke with patient, she states that she tested positive for COVID on 11/10/20. Pt states that she retested this past Friday and tested negative. When asked if patient tested at home she stated "no, I tested at a "clinic." She states that she is over [place] but still has a sinus infection and bronchitis. She started Doxycyline and Azelastine on 6/10. Patient states that she has issues with breathing at night. Patient is wanting to know how she should proceed about her upcoming procedures? Records are in visible in care everywhere - [facility]. Pt is aware that you will return to the office tomorrow. She requested a call after 10 am, preferably around 12 pm. Please advise, thanks.

## 2020-11-29 NOTE — Telephone Encounter (Signed)
Patient calling to inform she is currently taking meds for chest congestion//sinus infection.. Pt wants to know should she keep appt for colon procedure?  Plz advise

## 2020-11-29 NOTE — Telephone Encounter (Signed)
Attempted to call home number twice, unable to leave a message.   Lm on mobile vm for patient to return call.

## 2020-11-29 NOTE — Telephone Encounter (Signed)
Patient returned call. Best contact [phone]

## 2020-11-30 NOTE — Telephone Encounter (Signed)
Left detailed information below on patient's voicemail.

## 2020-11-30 NOTE — Telephone Encounter (Signed)
I think she is okay from a COVID infectious perspective. Looks like she is being treat for sinusitis. If she feels she is getting better and breathing okay, then I think okay to proceed with colonoscopy on Friday. If she is still having breathing difficulty and would feel more comfortable postponing that is okay, if she can let us know as soon as possible. Thanks

## 2020-11-30 NOTE — Telephone Encounter (Signed)
Attempted to reach patient on her home phone, unable to leave a vm.   Left detailed message on patient's vm with Dr. Doyne Keel recommendations. Advised patient to call us back at her earliest convenience if she would like to reschedule procedure.

## 2020-11-30 NOTE — Telephone Encounter (Signed)
Pt wanted to make Dr. Havery Moros aware that in addition to her sinus infection, Her BP has remained high after having Covid. It has been stabilizing but it is still a bit high so she wanted him to be aware of this before her procedure.

## 2020-12-01 NOTE — Telephone Encounter (Signed)
Spoke with patient in regards to all recommendations. Patient states that her systolic levels were in the 170's last week. Patient reports that her systolic values have fluctuated from 113-160's. Advised patient to reach out to PCP in regards to irregular BP. She states that they no longer have a PCP but she does have an appt with Dr. Scarlette Calico at the end of June with internal medicine. Patient expresses that she does not feel comfortable proceeding with colonoscopy due to her irregular BP readings. Advised patient that we will cancel her procedure and she will need to be evaluated for BP. Advised patient to call us back after her evaluation and we can get her rescheduled. Patient thanked me for my patience. She verbalized understanding and had no further concerns at the end of the call.

## 2020-12-01 NOTE — Telephone Encounter (Signed)
Patient is returning your call states she has not received any details message she received only on but not detailed.

## 2020-12-01 NOTE — Telephone Encounter (Signed)
Lm on vm for patient to return call to confirm that she has received Dr. Doyne Keel recommendations.

## 2020-12-01 NOTE — Telephone Encounter (Signed)
Okay thanks for the follow up. She can call to reschedule at her convenience.

## 2020-12-03 ENCOUNTER — Encounter: Payer: Medicare HMO | Admitting: Gastroenterology

## 2020-12-09 DIAGNOSIS — J309 Allergic rhinitis, unspecified: Secondary | ICD-10-CM | POA: Diagnosis not present

## 2020-12-15 ENCOUNTER — Other Ambulatory Visit: Payer: Self-pay

## 2020-12-15 ENCOUNTER — Ambulatory Visit (INDEPENDENT_AMBULATORY_CARE_PROVIDER_SITE_OTHER): Payer: Medicare HMO | Admitting: Internal Medicine

## 2020-12-15 ENCOUNTER — Encounter: Payer: Self-pay | Admitting: Internal Medicine

## 2020-12-15 VITALS — BP 144/86 | HR 69 | Temp 98.1°F | Resp 16 | Ht 61.0 in | Wt 157.0 lb

## 2020-12-15 DIAGNOSIS — F341 Dysthymic disorder: Secondary | ICD-10-CM

## 2020-12-15 DIAGNOSIS — Z1159 Encounter for screening for other viral diseases: Secondary | ICD-10-CM | POA: Diagnosis not present

## 2020-12-15 DIAGNOSIS — I10 Essential (primary) hypertension: Secondary | ICD-10-CM | POA: Diagnosis not present

## 2020-12-15 DIAGNOSIS — Z0000 Encounter for general adult medical examination without abnormal findings: Secondary | ICD-10-CM | POA: Diagnosis not present

## 2020-12-15 DIAGNOSIS — R7303 Prediabetes: Secondary | ICD-10-CM | POA: Diagnosis not present

## 2020-12-15 DIAGNOSIS — Z23 Encounter for immunization: Secondary | ICD-10-CM

## 2020-12-15 DIAGNOSIS — Z0001 Encounter for general adult medical examination with abnormal findings: Secondary | ICD-10-CM

## 2020-12-15 DIAGNOSIS — E785 Hyperlipidemia, unspecified: Secondary | ICD-10-CM | POA: Diagnosis not present

## 2020-12-15 DIAGNOSIS — R69 Illness, unspecified: Secondary | ICD-10-CM | POA: Diagnosis not present

## 2020-12-15 LAB — CBC WITH DIFFERENTIAL/PLATELET
Basophils Absolute: 0.1 10*3/uL (ref 0.0–0.1)
Basophils Relative: 0.8 % (ref 0.0–3.0)
Eosinophils Absolute: 0.3 10*3/uL (ref 0.0–0.7)
Eosinophils Relative: 4 % (ref 0.0–5.0)
HCT: 42.5 % (ref 36.0–46.0)
Hemoglobin: 14.2 g/dL (ref 12.0–15.0)
Lymphocytes Relative: 28.1 % (ref 12.0–46.0)
Lymphs Abs: 1.9 10*3/uL (ref 0.7–4.0)
MCHC: 33.5 g/dL (ref 30.0–36.0)
MCV: 88.4 fl (ref 78.0–100.0)
Monocytes Absolute: 0.5 10*3/uL (ref 0.1–1.0)
Monocytes Relative: 8.1 % (ref 3.0–12.0)
Neutro Abs: 4 10*3/uL (ref 1.4–7.7)
Neutrophils Relative %: 59 % (ref 43.0–77.0)
Platelets: 238 10*3/uL (ref 150.0–400.0)
RBC: 4.81 Mil/uL (ref 3.87–5.11)
RDW: 14.4 % (ref 11.5–15.5)
WBC: 6.7 10*3/uL (ref 4.0–10.5)

## 2020-12-15 LAB — URINALYSIS, ROUTINE W REFLEX MICROSCOPIC
Bilirubin Urine: NEGATIVE
Hgb urine dipstick: NEGATIVE
Ketones, ur: NEGATIVE
Leukocytes,Ua: NEGATIVE
Nitrite: NEGATIVE
Specific Gravity, Urine: >=1.03 — AB (ref 1.000–1.030)
Total Protein, Urine: NEGATIVE
Urine Glucose: NEGATIVE
Urobilinogen, UA: 0.2 (ref 0.0–1.0)
pH: 5.5 (ref 5.0–8.0)

## 2020-12-15 LAB — BASIC METABOLIC PANEL
BUN: 13 mg/dL (ref 6–23)
CO2: 31 mEq/L (ref 19–32)
Calcium: 9.5 mg/dL (ref 8.4–10.5)
Chloride: 103 mEq/L (ref 96–112)
Creatinine, Ser: 0.68 mg/dL (ref 0.40–1.20)
GFR: 88.73 mL/min (ref >60.00)
Glucose, Bld: 112 mg/dL — ABNORMAL HIGH (ref 70–99)
Potassium: 3.8 mEq/L (ref 3.5–5.1)
Sodium: 140 mEq/L (ref 135–145)

## 2020-12-15 LAB — LIPID PANEL
Cholesterol: 166 mg/dL (ref 0–200)
HDL: 55.2 mg/dL (ref >39.00)
LDL Cholesterol: 79 mg/dL (ref 0–99)
NonHDL: 110.61
Total CHOL/HDL Ratio: 3
Triglycerides: 158 mg/dL — ABNORMAL HIGH (ref 0.0–149.0)
VLDL: 31.6 mg/dL (ref 0.0–40.0)

## 2020-12-15 LAB — HEPATIC FUNCTION PANEL
ALT: 26 U/L (ref 0–35)
AST: 29 U/L (ref 0–37)
Albumin: 4.2 g/dL (ref 3.5–5.2)
Alkaline Phosphatase: 80 U/L (ref 39–117)
Bilirubin, Direct: 0.2 mg/dL (ref 0.0–0.3)
Total Bilirubin: 0.7 mg/dL (ref 0.2–1.2)
Total Protein: 6.7 g/dL (ref 6.0–8.3)

## 2020-12-15 LAB — TSH: TSH: 2.91 u[IU]/mL (ref 0.35–5.50)

## 2020-12-15 NOTE — Progress Notes (Signed)
Subjective:  Patient ID: Emily [name], female    DOB: [birth year]  Age: 70 y.o. MRN: [id number]  CC: Hypertension and Annual Exam  This visit occurred during the SARS-CoV-2 public health emergency.  Safety protocols were in place, including screening questions prior to the visit, additional usage of staff PPE, and extensive cleaning of exam room while observing appropriate contact time as indicated for disinfecting solutions.    HPI Monchel Pollitt presents for a CPX and f/up -  She is active and denies any recent episodes of headache, blurred vision, chest pain, shortness of breath, palpitations, edema, or fatigue.  Outpatient Medications Prior to Visit  Medication Sig Dispense Refill   Acetylcarnitine HCl (ACETYL L-CARNITINE) 250 MG CAPS Take by mouth. 200 mg every other day     atorvastatin (LIPITOR) 80 MG tablet TAKE 1 TABLET BY MOUTH EVERY DAY 90 tablet 1   b complex vitamins tablet Take 1 tablet by mouth daily.     Biotin 300 MCG TABS Take 600 mcg by mouth every other day.     Calcium-Magnesium-Zinc 500-250-12.5 MG TABS Take 1 tablet by mouth every other day.     candesartan (ATACAND) 8 MG tablet TAKE 1 TABLET BY MOUTH EVERY DAY 90 tablet 2   Chromium Picolinate 200 MCG TABS Take 1 tablet by mouth every other day.     clobetasol ointment (TEMOVATE) 0.05 % as needed.     clopidogrel (PLAVIX) 75 MG tablet TAKE 1 TABLET BY MOUTH EVERY DAY 90 tablet 1   Coenzyme Q10 (COQ10) 200 MG CAPS Take 1 capsule by mouth daily.     CRANBERRY JUICE EXTRACT PO Take 500 mg by mouth daily.     EPINEPHrine 0.3 mg/0.3 mL IJ SOAJ injection INJECT 0.3 MLS INTO THE MUSCLE ONCE FOR 1 DOSE  0   famotidine (PEPCID AC) 10 MG tablet Take 1 tablet (10 mg total) by mouth 2 (two) times daily. Take  1-2 tablets twice daily (Patient taking differently: Take 10 mg by mouth 2 (two) times daily. Take   1 tab in the am, 1-2 tabs at 8 pm,  and another 1-2 at 3 am) 180 tablet 3   finasteride (PROSCAR) 5 MG tablet  Take 5 mg by mouth daily.     Flaxseed, Linseed, (FLAX SEED OIL) 1000 MG CAPS Take 1,200 mg by mouth daily.     Garlic Oil 283 MG TABS Take 1 tablet by mouth daily.     metoprolol succinate (TOPROL-XL) 50 MG 24 hr tablet Take 1 tablet (50 mg total) by mouth daily. Take with or immediately following a meal. 90 tablet 3   nitroGLYCERIN (NITROSTAT) 0.4 MG SL tablet Place 1 tablet (0.4 mg total) under the tongue every 5 (five) minutes x 3 doses as needed for chest pain. 25 tablet 3   omega-3 acid ethyl esters (LOVAZA) 1 g capsule TAKE 1 CAPSULE BY MOUTH EVERY DAY 90 capsule 1   omeprazole (PRILOSEC) 20 MG capsule Take 1 capsule (20 mg total) by mouth daily as needed. 30 capsule 3   Probiotic Product (ALIGN PO) Take 1 capsule by mouth daily.     Rhodiola 300 MG CAPS Take 1 capsule by mouth daily.       Sodium Sulfate-Mag Sulfate-KCl (SUTAB) [phone] MG TABS Take 24 tablets by mouth as directed. MANUFACTURER CODES!! BIN: [id number] PCN: CN GROUP: [id number] MEMBER ID: [id number];[facility] AS SECONDARY INSURANCE ;NO PRIOR AUTHORIZATION 24 tablet 0   sucralfate (CARAFATE) 1 GM/10ML suspension Take 10 mLs (1 g  total) by mouth every 8 (eight) hours as needed. 420 mL 1   triamcinolone (NASACORT) 55 MCG/ACT AERO nasal inhaler Nasacort 55 mcg nasal spray aerosol  Spray 2 sprays every day by intranasal route as needed.     ALPRAZolam (XANAX) 0.25 MG tablet TAKE 1 TO 1 & 1/2 TABLETS BY MOUTH AT BEDTIME AS NEEDED **NO SUB GREENSTONE BRAND** 30 tablet 1   No facility-administered medications prior to visit.    ROS Review of Systems  Constitutional:  Negative for chills, diaphoresis, fatigue and unexpected weight change.  HENT: Negative.  Negative for sore throat.   Eyes: Negative.   Respiratory: Negative.  Negative for cough, chest tightness, shortness of breath and wheezing.   Cardiovascular:  Negative for chest pain, palpitations and leg swelling.  Gastrointestinal:  Negative for abdominal pain, constipation,  diarrhea, nausea and vomiting.  Endocrine: Negative.   Genitourinary: Negative.  Negative for difficulty urinating.  Musculoskeletal:  Negative for arthralgias, joint swelling and myalgias.  Skin: Negative.  Negative for color change and pallor.  Allergic/Immunologic: Negative.   Neurological: Negative.  Negative for dizziness, weakness, light-headedness and headaches.  Hematological:  Negative for adenopathy. Does not bruise/bleed easily.  Psychiatric/Behavioral: Negative.     Objective:  BP (!) 144/86 (BP Location: Right Arm, Patient Position: Sitting, Cuff Size: Large)   Pulse 69   Temp 98.1 F (36.7 C) (Oral)   Resp 16   Ht 5\' 1"  (1.549 m)   Wt 157 lb (71.2 kg)   SpO2 94%   BMI 29.66 kg/m   BP Readings from Last 3 Encounters:  12/15/20 (!) 144/86  10/21/20 124/82  05/14/20 (!) 127/52    Wt Readings from Last 3 Encounters:  12/15/20 157 lb (71.2 kg)  10/21/20 158 lb (71.7 kg)  07/27/20 156 lb 12.8 oz (71.1 kg)    Physical Exam Vitals reviewed.  Constitutional:      Appearance: Normal appearance.  HENT:     Nose: Nose normal.     Mouth/Throat:     Mouth: Mucous membranes are moist.  Eyes:     General: No scleral icterus.    Conjunctiva/sclera: Conjunctivae normal.  Cardiovascular:     Rate and Rhythm: Normal rate and regular rhythm.     Heart sounds: No murmur heard. Pulmonary:     Effort: Pulmonary effort is normal.     Breath sounds: No stridor. No wheezing, rhonchi or rales.  Abdominal:     General: Abdomen is flat. Bowel sounds are normal. There is no distension.     Palpations: Abdomen is soft. There is no hepatomegaly, splenomegaly or mass.     Tenderness: There is no abdominal tenderness.  Musculoskeletal:        General: Normal range of motion.     Cervical back: Neck supple.     Right lower leg: No edema.     Left lower leg: No edema.  Lymphadenopathy:     Cervical: No cervical adenopathy.  Skin:    General: Skin is warm and dry.      Coloration: Skin is not pale.  Neurological:     General: No focal deficit present.     Mental Status: She is alert and oriented to person, place, and time. Mental status is at baseline.  Psychiatric:        Mood and Affect: Mood normal.        Behavior: Behavior normal.    Lab Results  Component Value Date   WBC 6.7 12/15/2020  HGB 14.2 12/15/2020   HCT 42.5 12/15/2020   PLT 238.0 12/15/2020   GLUCOSE 112 (H) 12/15/2020   CHOL 166 12/15/2020   TRIG 158.0 (H) 12/15/2020   HDL 55.20 12/15/2020   LDLDIRECT 104.0 01/17/2018   LDLCALC 79 12/15/2020   ALT 26 12/15/2020   AST 29 12/15/2020   NA 140 12/15/2020   K 3.8 12/15/2020   CL 103 12/15/2020   CREATININE 0.68 12/15/2020   BUN 13 12/15/2020   CO2 31 12/15/2020   TSH 2.91 12/15/2020   INR 0.9 07/18/2007   HGBA1C 6.3 12/15/2020    No results found.  Assessment & Plan:   Lauralyn was seen today for hypertension and annual exam.  Diagnoses and all orders for this visit:  Need for hepatitis C screening test -     Hepatitis C antibody; Future -     Hepatitis C antibody  Essential hypertension- Her blood pressure is not adequately well controlled but she believes this is the whitecoat phenomenon.  She was not willing to increase the dose of candesartan.  She will continue to work on her lifestyle modifications. -     CBC with Differential/Platelet; Future -     Basic metabolic panel; Future -     Hepatic function panel; Future -     Urinalysis, Routine w reflex microscopic; Future -     Urinalysis, Routine w reflex microscopic -     Hepatic function panel -     Basic metabolic panel -     CBC with Differential/Platelet  Encounter for general adult medical examination with abnormal findings- Exam completed, labs reviewed, vaccines reviewed and updated, cancer screenings are up-to-date, patient education was given.  Prediabetes- Her A1c is at 6.3%.  Medical therapy is not yet indicated. -     Basic metabolic panel;  Future -     Hemoglobin A1c; Future -     Hemoglobin A1c -     Basic metabolic panel  Hyperlipidemia with target LDL less than 130- LDL goal achieved. Doing well on the statin  -     Lipid panel; Future -     Hepatic function panel; Future -     TSH; Future -     TSH -     Hepatic function panel -     Lipid panel  ANXIETY DEPRESSION -     ALPRAZolam (XANAX) 0.25 MG tablet; Take 1 tablet (0.25 mg total) by mouth 2 (two) times daily as needed for anxiety.  I have changed Terrianna Forse "Bev"'s ALPRAZolam. I am also having her start on Boostrix. Additionally, I am having her maintain her Acetyl L-Carnitine, Biotin, Calcium-Magnesium-Zinc, Chromium Picolinate, CoQ10, CRANBERRY JUICE EXTRACT PO, Garlic Oil, Rhodiola, Flax Seed Oil, b complex vitamins, Probiotic Product (ALIGN PO), nitroGLYCERIN, EPINEPHrine, famotidine, finasteride, metoprolol succinate, atorvastatin, clopidogrel, omega-3 acid ethyl esters, triamcinolone, Sutab, candesartan, clobetasol ointment, omeprazole, and sucralfate.  Meds ordered this encounter  Medications   ALPRAZolam (XANAX) 0.25 MG tablet    Sig: Take 1 tablet (0.25 mg total) by mouth 2 (two) times daily as needed for anxiety.    Dispense:  30 tablet    Refill:  1   Tdap (BOOSTRIX) 5-2.5-18.5 LF-MCG/0.5 injection    Sig: Inject 0.5 mLs into the muscle once for 1 dose.    Dispense:  0.5 mL    Refill:  0      Follow-up: Return in about 6 months (around 06/16/2021).  Scarlette Calico, MD

## 2020-12-15 NOTE — Patient Instructions (Signed)
Health Maintenance, Female Adopting a healthy lifestyle and getting preventive care are important in promoting health and wellness. Ask your health care provider about: The right schedule for you to have regular tests and exams. Things you can do on your own to prevent diseases and keep yourself healthy. What should I know about diet, weight, and exercise? Eat a healthy diet  Eat a diet that includes plenty of vegetables, fruits, low-fat dairy products, and lean protein. Do not eat a lot of foods that are high in solid fats, added sugars, or sodium.  Maintain a healthy weight Body mass index (BMI) is used to identify weight problems. It estimates body fat based on height and weight. Your health care provider can help determineyour BMI and help you achieve or maintain a healthy weight. Get regular exercise Get regular exercise. This is one of the most important things you can do for your health. Most adults should: Exercise for at least 150 minutes each week. The exercise should increase your heart rate and make you sweat (moderate-intensity exercise). Do strengthening exercises at least twice a week. This is in addition to the moderate-intensity exercise. Spend less time sitting. Even light physical activity can be beneficial. Watch cholesterol and blood lipids Have your blood tested for lipids and cholesterol at 70 years of age, then havethis test every 5 years. Have your cholesterol levels checked more often if: Your lipid or cholesterol levels are high. You are older than 70 years of age. You are at high risk for heart disease. What should I know about cancer screening? Depending on your health history and family history, you may need to have cancer screening at various ages. This may include screening for: Breast cancer. Cervical cancer. Colorectal cancer. Skin cancer. Lung cancer. What should I know about heart disease, diabetes, and high blood pressure? Blood pressure and heart  disease High blood pressure causes heart disease and increases the risk of stroke. This is more likely to develop in people who have high blood pressure readings, are of African descent, or are overweight. Have your blood pressure checked: Every 3-5 years if you are 18-39 years of age. Every year if you are 40 years old or older. Diabetes Have regular diabetes screenings. This checks your fasting blood sugar level. Have the screening done: Once every three years after age 40 if you are at a normal weight and have a low risk for diabetes. More often and at a younger age if you are overweight or have a high risk for diabetes. What should I know about preventing infection? Hepatitis B If you have a higher risk for hepatitis B, you should be screened for this virus. Talk with your health care provider to find out if you are at risk forhepatitis B infection. Hepatitis C Testing is recommended for: Everyone born from 1945 through 1965. Anyone with known risk factors for hepatitis C. Sexually transmitted infections (STIs) Get screened for STIs, including gonorrhea and chlamydia, if: You are sexually active and are younger than 70 years of age. You are older than 70 years of age and your health care provider tells you that you are at risk for this type of infection. Your sexual activity has changed since you were last screened, and you are at increased risk for chlamydia or gonorrhea. Ask your health care provider if you are at risk. Ask your health care provider about whether you are at high risk for HIV. Your health care provider may recommend a prescription medicine to help   prevent HIV infection. If you choose to take medicine to prevent HIV, you should first get tested for HIV. You should then be tested every 3 months for as long as you are taking the medicine. Pregnancy If you are about to stop having your period (premenopausal) and you may become pregnant, seek counseling before you get  pregnant. Take 400 to 800 micrograms (mcg) of folic acid every day if you become pregnant. Ask for birth control (contraception) if you want to prevent pregnancy. Osteoporosis and menopause Osteoporosis is a disease in which the bones lose minerals and strength with aging. This can result in bone fractures. If you are 65 years old or older, or if you are at risk for osteoporosis and fractures, ask your health care provider if you should: Be screened for bone loss. Take a calcium or vitamin D supplement to lower your risk of fractures. Be given hormone replacement therapy (HRT) to treat symptoms of menopause. Follow these instructions at home: Lifestyle Do not use any products that contain nicotine or tobacco, such as cigarettes, e-cigarettes, and chewing tobacco. If you need help quitting, ask your health care provider. Do not use street drugs. Do not share needles. Ask your health care provider for help if you need support or information about quitting drugs. Alcohol use Do not drink alcohol if: Your health care provider tells you not to drink. You are pregnant, may be pregnant, or are planning to become pregnant. If you drink alcohol: Limit how much you use to 0-1 drink a day. Limit intake if you are breastfeeding. Be aware of how much alcohol is in your drink. In the [place] one drink equals one 12 oz bottle of beer (355 mL), one 5 oz glass of wine (148 mL), or one 1 oz glass of hard liquor (44 mL). General instructions Schedule regular health, dental, and eye exams. Stay current with your vaccines. Tell your health care provider if: You often feel depressed. You have ever been abused or do not feel safe at home. Summary Adopting a healthy lifestyle and getting preventive care are important in promoting health and wellness. Follow your health care provider's instructions about healthy diet, exercising, and getting tested or screened for diseases. Follow your health care provider's  instructions on monitoring your cholesterol and blood pressure. This information is not intended to replace advice given to you by your health care provider. Make sure you discuss any questions you have with your healthcare provider. Document Revised: 05/29/2018 Document Reviewed: 05/29/2018 Elsevier Patient Education  2022 [employer] Inc.  

## 2020-12-16 LAB — HEMOGLOBIN A1C: Hgb A1c MFr Bld: 6.3 % (ref 4.6–6.5)

## 2020-12-16 LAB — HEPATITIS C ANTIBODY
Hepatitis C Ab: NONREACTIVE
SIGNAL TO CUT-OFF: 0.01 (ref <1.00)

## 2020-12-21 MED ORDER — ALPRAZOLAM 0.25 MG PO TABS: 0.2500 mg | ORAL_TABLET | Freq: Two times a day (BID) | ORAL | 1 refills | Status: DC | PRN

## 2020-12-21 MED ORDER — BOOSTRIX 5-2.5-18.5 LF-MCG/0.5 IM SUSP: 0.5000 mL | Freq: Once | INTRAMUSCULAR | 0 refills | Status: AC

## 2020-12-23 DIAGNOSIS — H6123 Impacted cerumen, bilateral: Secondary | ICD-10-CM | POA: Diagnosis not present

## 2020-12-23 DIAGNOSIS — H903 Sensorineural hearing loss, bilateral: Secondary | ICD-10-CM | POA: Diagnosis not present

## 2020-12-29 ENCOUNTER — Telehealth: Payer: Self-pay | Admitting: Cardiovascular Disease

## 2020-12-29 MED ORDER — METOPROLOL SUCCINATE ER 50 MG PO TB24: 50.0000 mg | ORAL_TABLET | Freq: Every day | ORAL | 0 refills | Status: DC

## 2020-12-29 NOTE — Telephone Encounter (Signed)
*  STAT* If patient is at the pharmacy, call can be transferred to refill team.   1. Which medications need to be refilled? (please list name of each medication and dose if known)  metoprolol succinate (TOPROL-XL) 50 MG 24 hr tablet  2. Which pharmacy/location (including street and city if local pharmacy) is medication to be sent to?  [facility] - [place], [place] - [address]  3. Do they need a 30 day or 90 day supply? Elizabeth

## 2021-01-25 DIAGNOSIS — Z79899 Other long term (current) drug therapy: Secondary | ICD-10-CM | POA: Diagnosis not present

## 2021-01-25 DIAGNOSIS — L658 Other specified nonscarring hair loss: Secondary | ICD-10-CM | POA: Diagnosis not present

## 2021-01-31 ENCOUNTER — Ambulatory Visit (HOSPITAL_COMMUNITY)
Admission: RE | Admit: 2021-01-31 | Payer: Medicare HMO | Source: Ambulatory Visit | Attending: Cardiovascular Disease | Admitting: Cardiovascular Disease

## 2021-01-31 DIAGNOSIS — J309 Allergic rhinitis, unspecified: Secondary | ICD-10-CM | POA: Diagnosis not present

## 2021-01-31 DIAGNOSIS — N390 Urinary tract infection, site not specified: Secondary | ICD-10-CM | POA: Diagnosis not present

## 2021-02-07 ENCOUNTER — Other Ambulatory Visit (HOSPITAL_COMMUNITY): Payer: Self-pay | Admitting: Cardiology

## 2021-02-10 ENCOUNTER — Ambulatory Visit (HOSPITAL_COMMUNITY): Payer: Medicare HMO

## 2021-02-16 ENCOUNTER — Other Ambulatory Visit: Payer: Self-pay

## 2021-02-16 ENCOUNTER — Ambulatory Visit (HOSPITAL_COMMUNITY)
Admission: RE | Admit: 2021-02-16 | Discharge: 2021-02-16 | Disposition: A | Discharge status: Home / Self Care | Payer: Medicare HMO | Source: Ambulatory Visit | Attending: Internal Medicine | Admitting: Internal Medicine

## 2021-02-16 DIAGNOSIS — I701 Atherosclerosis of renal artery: Secondary | ICD-10-CM | POA: Diagnosis not present

## 2021-02-18 DIAGNOSIS — H2513 Age-related nuclear cataract, bilateral: Secondary | ICD-10-CM | POA: Diagnosis not present

## 2021-02-18 DIAGNOSIS — H43811 Vitreous degeneration, right eye: Secondary | ICD-10-CM | POA: Diagnosis not present

## 2021-02-18 DIAGNOSIS — H04123 Dry eye syndrome of bilateral lacrimal glands: Secondary | ICD-10-CM | POA: Diagnosis not present

## 2021-02-18 DIAGNOSIS — I10 Essential (primary) hypertension: Secondary | ICD-10-CM | POA: Diagnosis not present

## 2021-03-28 DIAGNOSIS — H43811 Vitreous degeneration, right eye: Secondary | ICD-10-CM | POA: Diagnosis not present

## 2021-03-28 DIAGNOSIS — I10 Essential (primary) hypertension: Secondary | ICD-10-CM | POA: Diagnosis not present

## 2021-03-28 DIAGNOSIS — H04123 Dry eye syndrome of bilateral lacrimal glands: Secondary | ICD-10-CM | POA: Diagnosis not present

## 2021-03-28 DIAGNOSIS — H2513 Age-related nuclear cataract, bilateral: Secondary | ICD-10-CM | POA: Diagnosis not present

## 2021-04-11 ENCOUNTER — Other Ambulatory Visit: Payer: Self-pay | Admitting: Cardiovascular Disease

## 2021-04-20 DIAGNOSIS — R0789 Other chest pain: Secondary | ICD-10-CM | POA: Diagnosis not present

## 2021-04-20 DIAGNOSIS — R079 Chest pain, unspecified: Secondary | ICD-10-CM | POA: Diagnosis not present

## 2021-04-20 DIAGNOSIS — R0902 Hypoxemia: Secondary | ICD-10-CM | POA: Diagnosis not present

## 2021-04-21 ENCOUNTER — Telehealth (HOSPITAL_COMMUNITY): Payer: Self-pay | Admitting: *Deleted

## 2021-04-21 ENCOUNTER — Encounter: Payer: Self-pay | Admitting: Gastroenterology

## 2021-04-21 NOTE — Telephone Encounter (Signed)
Pt called stating she had an event yesterday where she had an aching feeling in her chest and tingling in her left upper arm for about 40 min then lingering heaviness in chest for about 4 hours. EMS was called ekg was normal ,systolic bp was 415 with ems and then 130s later. Pt said she feels fine now but was told per EMS to report this to her cardiologist.  Routed to Dr.McLean

## 2021-04-21 NOTE — Telephone Encounter (Signed)
Needs followup with me or [facility] clinic to evaluate.

## 2021-04-22 NOTE — Telephone Encounter (Signed)
Left vm for pt to return my call to schedule office visit next week.

## 2021-05-24 ENCOUNTER — Ambulatory Visit: Payer: Medicare HMO | Admitting: Cardiovascular Disease

## 2021-05-24 DIAGNOSIS — B009 Herpesviral infection, unspecified: Secondary | ICD-10-CM | POA: Diagnosis not present

## 2021-05-25 ENCOUNTER — Other Ambulatory Visit: Payer: Self-pay | Admitting: Cardiovascular Disease

## 2021-05-25 ENCOUNTER — Other Ambulatory Visit (HOSPITAL_COMMUNITY): Payer: Self-pay | Admitting: Cardiology

## 2021-05-26 DIAGNOSIS — Z1231 Encounter for screening mammogram for malignant neoplasm of breast: Secondary | ICD-10-CM | POA: Diagnosis not present

## 2021-05-26 DIAGNOSIS — Z780 Asymptomatic menopausal state: Secondary | ICD-10-CM | POA: Diagnosis not present

## 2021-05-26 LAB — HM MAMMOGRAPHY

## 2021-05-26 LAB — HM DEXA SCAN: HM Dexa Scan: -0.9

## 2021-06-24 ENCOUNTER — Other Ambulatory Visit: Payer: Self-pay

## 2021-06-24 ENCOUNTER — Ambulatory Visit (AMBULATORY_SURGERY_CENTER): Payer: Medicare HMO | Admitting: *Deleted

## 2021-06-24 VITALS — Ht 61.0 in | Wt 157.6 lb

## 2021-06-24 DIAGNOSIS — Z86010 Personal history of colonic polyps: Secondary | ICD-10-CM

## 2021-06-24 NOTE — Progress Notes (Signed)
No egg or soy allergy known to patient  No issues known to pt with past sedation with any surgeries or procedures Patient denies ever being told they had issues or difficulty with intubation  No FH of Malignant Hyperthermia Pt is not on diet pills Pt is not on  home 02  Pt is on blood thinners - she is on Plavix- we have a 5 days hold  Pt denies issues with constipation  No A fib or A flutter  Pt is not vaccinated  for Covid   Due to the COVID-19 pandemic we are asking patients to follow certain guidelines in PV and the [place]   Pt aware of COVID protocols and [facility] guidelines  Pt has a Sutab prep at home  PV completed over the phone. Pt verified name, DOB, address and insurance during PV today.  Pt mailed instruction packet with copy of consent form to read and not return, and instructions.   Pt encouraged to call with questions or issues.  If pt has My chart, procedure instructions sent via My Chart

## 2021-06-28 ENCOUNTER — Telehealth (HOSPITAL_COMMUNITY): Payer: Self-pay | Admitting: *Deleted

## 2021-06-28 ENCOUNTER — Other Ambulatory Visit (HOSPITAL_COMMUNITY): Payer: Self-pay | Admitting: Cardiology

## 2021-06-28 DIAGNOSIS — I10 Essential (primary) hypertension: Secondary | ICD-10-CM

## 2021-06-28 NOTE — Telephone Encounter (Signed)
Orders only encounter

## 2021-06-30 DIAGNOSIS — H903 Sensorineural hearing loss, bilateral: Secondary | ICD-10-CM | POA: Diagnosis not present

## 2021-06-30 DIAGNOSIS — Z79899 Other long term (current) drug therapy: Secondary | ICD-10-CM | POA: Diagnosis not present

## 2021-06-30 DIAGNOSIS — R69 Illness, unspecified: Secondary | ICD-10-CM | POA: Diagnosis not present

## 2021-06-30 DIAGNOSIS — H6123 Impacted cerumen, bilateral: Secondary | ICD-10-CM | POA: Diagnosis not present

## 2021-06-30 DIAGNOSIS — Z791 Long term (current) use of non-steroidal anti-inflammatories (NSAID): Secondary | ICD-10-CM | POA: Diagnosis not present

## 2021-07-01 ENCOUNTER — Encounter: Payer: Self-pay | Admitting: Gastroenterology

## 2021-07-05 ENCOUNTER — Other Ambulatory Visit: Payer: Self-pay | Admitting: Cardiovascular Disease

## 2021-07-08 ENCOUNTER — Encounter: Payer: Self-pay | Admitting: Gastroenterology

## 2021-07-08 ENCOUNTER — Ambulatory Visit (AMBULATORY_SURGERY_CENTER): Payer: Medicare HMO | Admitting: Gastroenterology

## 2021-07-08 VITALS — BP 117/66 | HR 54 | Temp 97.1°F | Resp 12 | Ht 61.0 in | Wt 156.4 lb

## 2021-07-08 DIAGNOSIS — I2510 Atherosclerotic heart disease of native coronary artery without angina pectoris: Secondary | ICD-10-CM | POA: Diagnosis not present

## 2021-07-08 DIAGNOSIS — D123 Benign neoplasm of transverse colon: Secondary | ICD-10-CM

## 2021-07-08 DIAGNOSIS — Z86010 Personal history of colonic polyps: Secondary | ICD-10-CM

## 2021-07-08 DIAGNOSIS — I252 Old myocardial infarction: Secondary | ICD-10-CM | POA: Diagnosis not present

## 2021-07-08 DIAGNOSIS — I10 Essential (primary) hypertension: Secondary | ICD-10-CM | POA: Diagnosis not present

## 2021-07-08 MED ORDER — SODIUM CHLORIDE 0.9 % IV SOLN: 500.0000 mL | Freq: Once | INTRAVENOUS | Status: DC

## 2021-07-08 NOTE — Patient Instructions (Signed)
Information on polyps and diverticulosis given to you today.  Await pathology results.  Resume Plavix tomorrow.  Resume previous diet.   YOU HAD AN ENDOSCOPIC PROCEDURE TODAY AT [facility]:   Refer to the procedure report that was given to you for any specific questions about what was found during the examination.  If the procedure report does not answer your questions, please call your gastroenterologist to clarify.  If you requested that your care partner not be given the details of your procedure findings, then the procedure report has been included in a sealed envelope for you to review at your convenience later.  YOU SHOULD EXPECT: Some feelings of bloating in the abdomen. Passage of more gas than usual.  Walking can help get rid of the air that was put into your GI tract during the procedure and reduce the bloating. If you had a lower endoscopy (such as a colonoscopy or flexible sigmoidoscopy) you may notice spotting of blood in your stool or on the toilet paper. If you underwent a bowel prep for your procedure, you may not have a normal bowel movement for a few days.  Please Note:  You might notice some irritation and congestion in your nose or some drainage.  This is from the oxygen used during your procedure.  There is no need for concern and it should clear up in a day or so.  SYMPTOMS TO REPORT IMMEDIATELY:  Following lower endoscopy (colonoscopy or flexible sigmoidoscopy):  Excessive amounts of blood in the stool  Significant tenderness or worsening of abdominal pains  Swelling of the abdomen that is new, acute  Fever of 100F or higher  For urgent or emergent issues, a gastroenterologist can be reached at any hour by calling [phone]. Do not use MyChart messaging for urgent concerns.    DIET:  We do recommend a small meal at first, but then you may proceed to your regular diet.  Drink plenty of fluids but you should avoid alcoholic beverages for 24  hours.  ACTIVITY:  You should plan to take it easy for the rest of today and you should NOT DRIVE or use heavy machinery until tomorrow (because of the sedation medicines used during the test).    FOLLOW UP: Our staff will call the number listed on your records 48-72 hours following your procedure to check on you and address any questions or concerns that you may have regarding the information given to you following your procedure. If we do not reach you, we will leave a message.  We will attempt to reach you two times.  During this call, we will ask if you have developed any symptoms of COVID 19. If you develop any symptoms (ie: fever, flu-like symptoms, shortness of breath, cough etc.) before then, please call [phone].  If you test positive for Covid 19 in the 2 weeks post procedure, please call and report this information to [place].    If any biopsies were taken you will be contacted by phone or by letter within the next 1-3 weeks.  Please call us at [phone] if you have not heard about the biopsies in 3 weeks.    SIGNATURES/CONFIDENTIALITY: You and/or your care partner have signed paperwork which will be entered into your electronic medical record.  These signatures attest to the fact that that the information above on your After Visit Summary has been reviewed and is understood.  Full responsibility of the confidentiality of this discharge information lies with you  and/or your care-partner.  °

## 2021-07-08 NOTE — Progress Notes (Signed)
Report given to PACU, vss 

## 2021-07-08 NOTE — Progress Notes (Signed)
[facility] History and Physical   Primary Care Physician:  [name], MD   Reason for Procedure:   History of colon polyps  Plan:    colonoscopy     HPI: Emily [name] is a 71 y.o. female  here for colonoscopy surveillance - 6 TAs/SSPs removed 07/2015. Patient denies any bowel symptoms at this time. LAst took Plavix 5 days ago. No family history of colon cancer known. Otherwise feels well without any cardiopulmonary symptoms.    Past Medical History:  Diagnosis Date   Allergy    Anxiety    no anxiety per pt- she wants removed    CAD (coronary artery disease)    Cataract    forming    Chronic insomnia    Colon polyp    Depression    GERD (gastroesophageal reflux disease)    GERD with esophagitis    Heart attack ([place]) 06/2007   Heart murmur    Hyperlipemia    Hypertension    Osteopenia    Perirectal abscess    Shingles 08/31/2017   2nd dx with shingles, per pt- shingles x 4 - severe rxn to shingles vax - last shingles 05-2021    Past Surgical History:  Procedure Laterality Date   CESAREAN SECTION     x 2   COLONOSCOPY     COLONOSCOPY W/ POLYPECTOMY  2007   INCISION AND DRAINAGE ABSCESS ANAL     POLYPECTOMY     PTCA with stent to third obtuse marginal     WISDOM TOOTH EXTRACTION      Prior to Admission medications   Medication Sig Start Date End Date Taking? Authorizing Provider  Acetylcarnitine HCl (ACETYL L-CARNITINE) 250 MG CAPS Take by mouth. 200 mg every other day   Yes [provider]  atorvastatin (LIPITOR) 80 MG tablet Take 1 tablet (80 mg total) by mouth daily. NEEDS APPOINTMENT FOR MORE REFILLS 05/25/21  Yes Larey Dresser, MD  b complex vitamins tablet Take 1 tablet by mouth daily.   Yes [provider]  Biotin 300 MCG TABS Take 600 mcg by mouth every other day.   Yes [provider]  Calcium-Magnesium-Zinc 500-250-12.5 MG TABS Take 1 tablet by mouth every other day.   Yes [provider]   candesartan (ATACAND) 8 MG tablet TAKE 1 TABLET BY MOUTH EVERY DAY *par brand/no substitution* 05/25/21  Yes Lorretta Harp, MD  Chromium Picolinate 200 MCG TABS Take 1 tablet by mouth every other day.   Yes [provider]  clopidogrel (PLAVIX) 75 MG tablet Take 1 tablet (75 mg total) by mouth daily. NEEDS APPOINTMENT FOR MORE REFILLS 05/25/21  Yes Larey Dresser, MD  Coenzyme Q10 (COQ10) 200 MG CAPS Take 1 capsule by mouth daily.   Yes [provider]  CRANBERRY JUICE EXTRACT PO Take 500 mg by mouth daily.   Yes [provider]  famotidine (PEPCID AC) 10 MG tablet Take 1 tablet (10 mg total) by mouth 2 (two) times daily. Take  1-2 tablets twice daily Patient taking differently: Take 10 mg by mouth 2 (two) times daily. Take 2 tab in the am,  2 tabs at 8 pm,  and another 1-2 at 3 am 03/19/18  Yes Karim Aiello, Carlota Raspberry, MD  finasteride (PROSCAR) 5 MG tablet Take 5 mg by mouth daily. 06/03/18  Yes [provider]  Flaxseed, Linseed, (FLAX SEED OIL) 1000 MG CAPS Take 1,200 mg by mouth daily.   Yes [provider]  Garlic Oil 165 MG TABS Take 1 tablet by mouth daily.   Yes [provider]  metoprolol succinate (TOPROL-XL) 50 MG 24 hr tablet TAKE 1 TABLET BY MOUTH EVERY DAY WITH FOOD 07/05/21  Yes Lorretta Harp, MD  omega-3 acid ethyl esters (LOVAZA) 1 g capsule TAKE 1 CAPSULE BY MOUTH EVERY DAY 07/05/21  Yes Larey Dresser, MD  Probiotic Product (ALIGN PO) Take 1 capsule by mouth daily.   Yes [provider]  Rhodiola 300 MG CAPS Take 1 capsule by mouth daily.     Yes [provider]  triamcinolone (NASACORT) 55 MCG/ACT AERO nasal inhaler Nasacort 55 mcg nasal spray aerosol  Spray 2 sprays every day by intranasal route as needed.   Yes [provider]  ALPRAZolam (XANAX) 0.25 MG tablet Take 1 tablet (0.25 mg total) by mouth 2 (two) times daily as needed for anxiety. 12/21/20   [name], MD  clobetasol ointment  (TEMOVATE) 0.05 % as needed. 09/27/20   [provider]  EPINEPHrine 0.3 mg/0.3 mL IJ SOAJ injection INJECT 0.3 MLS INTO THE MUSCLE ONCE FOR 1 DOSE 09/03/17   [provider]  nitroGLYCERIN (NITROSTAT) 0.4 MG SL tablet Place 1 tablet (0.4 mg total) under the tongue every 5 (five) minutes x 3 doses as needed for chest pain. 12/14/15   Larey Dresser, MD  omeprazole (PRILOSEC) 20 MG capsule Take 1 capsule (20 mg total) by mouth daily as needed. 10/21/20   Akash Winski, Carlota Raspberry, MD  sucralfate (CARAFATE) 1 GM/10ML suspension Take 10 mLs (1 g total) by mouth every 8 (eight) hours as needed. Patient not taking: Reported on 06/24/2021 10/21/20   Yetta Flock, MD    Current Outpatient Medications  Medication Sig Dispense Refill   Acetylcarnitine HCl (ACETYL L-CARNITINE) 250 MG CAPS Take by mouth. 200 mg every other day     atorvastatin (LIPITOR) 80 MG tablet Take 1 tablet (80 mg total) by mouth daily. NEEDS APPOINTMENT FOR MORE REFILLS 15 tablet 0   b complex vitamins tablet Take 1 tablet by mouth daily.     Biotin 300 MCG TABS Take 600 mcg by mouth every other day.     Calcium-Magnesium-Zinc 500-250-12.5 MG TABS Take 1 tablet by mouth every other day.     candesartan (ATACAND) 8 MG tablet TAKE 1 TABLET BY MOUTH EVERY DAY *par brand/no substitution* 90 tablet 2   Chromium Picolinate 200 MCG TABS Take 1 tablet by mouth every other day.     clopidogrel (PLAVIX) 75 MG tablet Take 1 tablet (75 mg total) by mouth daily. NEEDS APPOINTMENT FOR MORE REFILLS 15 tablet 0   Coenzyme Q10 (COQ10) 200 MG CAPS Take 1 capsule by mouth daily.     CRANBERRY JUICE EXTRACT PO Take 500 mg by mouth daily.     famotidine (PEPCID AC) 10 MG tablet Take 1 tablet (10 mg total) by mouth 2 (two) times daily. Take  1-2 tablets twice daily (Patient taking differently: Take 10 mg by mouth 2 (two) times daily. Take 2 tab in the am,  2 tabs at 8 pm,  and another 1-2 at 3 am) 180 tablet 3   finasteride (PROSCAR) 5 MG  tablet Take 5 mg by mouth daily.     Flaxseed, Linseed, (FLAX SEED OIL) 1000 MG CAPS Take 1,200 mg by mouth daily.     Garlic Oil 537 MG TABS Take 1 tablet by mouth daily.     metoprolol succinate (TOPROL-XL) 50 MG 24  hr tablet TAKE 1 TABLET BY MOUTH EVERY DAY WITH FOOD 30 tablet 0   omega-3 acid ethyl esters (LOVAZA) 1 g capsule TAKE 1 CAPSULE BY MOUTH EVERY DAY 90 capsule 1   Probiotic Product (ALIGN PO) Take 1 capsule by mouth daily.     Rhodiola 300 MG CAPS Take 1 capsule by mouth daily.       triamcinolone (NASACORT) 55 MCG/ACT AERO nasal inhaler Nasacort 55 mcg nasal spray aerosol  Spray 2 sprays every day by intranasal route as needed.     ALPRAZolam (XANAX) 0.25 MG tablet Take 1 tablet (0.25 mg total) by mouth 2 (two) times daily as needed for anxiety. 30 tablet 1   clobetasol ointment (TEMOVATE) 0.05 % as needed.     EPINEPHrine 0.3 mg/0.3 mL IJ SOAJ injection INJECT 0.3 MLS INTO THE MUSCLE ONCE FOR 1 DOSE  0   nitroGLYCERIN (NITROSTAT) 0.4 MG SL tablet Place 1 tablet (0.4 mg total) under the tongue every 5 (five) minutes x 3 doses as needed for chest pain. 25 tablet 3   omeprazole (PRILOSEC) 20 MG capsule Take 1 capsule (20 mg total) by mouth daily as needed. 30 capsule 3   sucralfate (CARAFATE) 1 GM/10ML suspension Take 10 mLs (1 g total) by mouth every 8 (eight) hours as needed. (Patient not taking: Reported on 06/24/2021) 420 mL 1   Current Facility-Administered Medications  Medication Dose Route Frequency Provider Last Rate Last Admin   0.9 %  sodium chloride infusion  500 mL Intravenous Once Jennfer Gassen, Carlota Raspberry, MD        Allergies as of 07/08/2021 - Review Complete 07/08/2021  Allergen Reaction Noted   Benzonatate  06/16/2016   Chlorpromazine hcl     Codeine     Doxycycline  06/29/2016   Hydrocodone     Hydrocodone-acetaminophen Nausea And Vomiting 11/05/2008   Lisinopril Cough 03/12/2020   Lovaza [omega-3-acid ethyl esters] Other (See Comments) 02/10/2016   Penicillins      Prochlorperazine edisylate     Promethazine hcl     Sulfonamide derivatives      Family History  Problem Relation Age of Onset   Skin cancer Mother    Allergy (severe) Father    Heart disease Father    Leukemia Father        CML   Renal Disease Paternal Grandfather        one removed    Esophageal cancer Maternal Grandfather    Colon cancer Neg Hx    Colon polyps Neg Hx    Rectal cancer Neg Hx    Stomach cancer Neg Hx     Social History   Socioeconomic History   Marital status: Married    Spouse name: Not on file   Number of children: 2   Years of education: Not on file   Highest education level: Not on file  Occupational History   Occupation: housewife/ student  Tobacco Use   Smoking status: Former    Packs/day: 2.00    Years: 20.00    Pack years: 40.00    Types: Cigarettes    Quit date: 06/19/2006    Years since quitting: 15.0   Smokeless tobacco: Never  Vaping Use   Vaping Use: Never used  Substance and Sexual Activity   Alcohol use: Not Currently    Alcohol/week: 0.0 standard drinks    Comment: <2 monthly; rare   Drug use: No   Sexual activity: Not on file  Other Topics Concern   Not  on file  Social History Narrative   Resides in [place] with her husband.    Husband works in [place] and comes home two to three times a year   2 children who go to [employer]   Just got accepted to [employer]   Exercise- NO   Social Determinants of Health   Financial Resource Strain: Not on file  Food Insecurity: Not on file  Transportation Needs: Not on file  Physical Activity: Not on file  Stress: Not on file  Social Connections: Not on file  Intimate Partner Violence: Not on file    Review of Systems: All other review of systems negative except as mentioned in the HPI.  Physical Exam: Vital signs BP (!) 160/89    Pulse 62    Temp (!) 97.1 F (36.2 C)    Resp 15    Ht 5\' 1"  (1.549 m)    Wt 156 lb 6.4 oz (70.9 kg)    SpO2 96%    BMI 29.55 kg/m   General:   Alert,   Well-developed, pleasant and cooperative in NAD Lungs:  Clear throughout to auscultation.   Heart:  Regular rate and rhythm Abdomen:  Soft, nontender and nondistended.   Neuro/Psych:  Alert and cooperative. Normal mood and affect. A and O x 3  Jolly Mango, MD [facility]

## 2021-07-08 NOTE — Progress Notes (Signed)
Called to room to assist during endoscopic procedure.  Patient ID and intended procedure confirmed with present staff. Received instructions for my participation in the procedure from the performing physician.  

## 2021-07-08 NOTE — Op Note (Signed)
[place] Patient Name: Emily [name] Procedure Date: 07/08/2021 11:30 AM MRN: [id number] Endoscopist: Remo Lipps P. Havery Moros , MD Age: 71 Referring MD:  Date of Birth: Jun 21, 1950 Gender: Female Account #: [id number] Procedure:                Colonoscopy Indications:              High risk colon cancer surveillance: Personal                            history of colonic polyps - 6 small TAs / SSPs                            removed 07/2015 Medicines:                Monitored Anesthesia Care Procedure:                Pre-Anesthesia Assessment:                           - Prior to the procedure, a History and Physical                            was performed, and patient medications and                            allergies were reviewed. The patient's tolerance of                            previous anesthesia was also reviewed. The risks                            and benefits of the procedure and the sedation                            options and risks were discussed with the patient.                            All questions were answered, and informed consent                            was obtained. Prior Anticoagulants: The patient                            last took Plavix (clopidogrel) 5 days prior to the                            procedure. ASA Grade Assessment: III - A patient                            with severe systemic disease. After reviewing the                            risks and benefits, the patient was deemed in  satisfactory condition to undergo the procedure.                           After obtaining informed consent, the colonoscope                            was passed under direct vision. Throughout the                            procedure, the patient's blood pressure, pulse, and                            oxygen saturations were monitored continuously. The                            PCF-HQ190L Colonoscope was  introduced through the                            anus and advanced to the the cecum, identified by                            appendiceal orifice and ileocecal valve. The                            colonoscopy was performed without difficulty. The                            patient tolerated the procedure well. The quality                            of the bowel preparation was good. The ileocecal                            valve, appendiceal orifice, and rectum were                            photographed. Scope In: 11:37:24 AM Scope Out: 11:51:18 AM Scope Withdrawal Time: 0 hours 12 minutes 10 seconds  Total Procedure Duration: 0 hours 13 minutes 54 seconds  Findings:                 The perianal and digital rectal examinations were                            normal other than some erythema / irritated                            perianal skin likely from prep                           A 5 to 6 mm polyp was found in the transverse                            colon. The polyp was sessile. The polyp was removed  with a cold snare. Resection and retrieval were                            complete.                           A few small-mouthed diverticula were found in the                            sigmoid colon.                           Internal hemorrhoids were found during                            retroflexion. The hemorrhoids were small.                           The exam was otherwise without abnormality. Complications:            No immediate complications. Estimated blood loss:                            Minimal. Estimated Blood Loss:     Estimated blood loss was minimal. Impression:               - One 5 to 6 mm polyp in the transverse colon,                            removed with a cold snare. Resected and retrieved.                           - Diverticulosis in the sigmoid colon.                           - Internal hemorrhoids.                            - The examination was otherwise normal. Recommendation:           - Patient has a contact number available for                            emergencies. The signs and symptoms of potential                            delayed complications were discussed with the                            patient. Return to normal activities tomorrow.                            Written discharge instructions were provided to the                            patient.                           -  Resume previous diet.                           - Continue present medications.                           - Resume Plavix tomorrow                           - Await pathology results. Remo Lipps P. Dravon Nott, MD 07/08/2021 11:57:02 AM This report has been signed electronically.

## 2021-07-12 ENCOUNTER — Telehealth: Payer: Self-pay

## 2021-07-12 NOTE — Telephone Encounter (Signed)
Left message on answering machine. 

## 2021-08-09 DIAGNOSIS — L658 Other specified nonscarring hair loss: Secondary | ICD-10-CM | POA: Diagnosis not present

## 2021-08-12 ENCOUNTER — Other Ambulatory Visit (HOSPITAL_COMMUNITY): Payer: Self-pay | Admitting: Cardiology

## 2021-08-12 ENCOUNTER — Other Ambulatory Visit: Payer: Self-pay | Admitting: Cardiovascular Disease

## 2021-09-02 ENCOUNTER — Other Ambulatory Visit: Payer: Self-pay

## 2021-09-02 ENCOUNTER — Encounter: Payer: Self-pay | Admitting: Cardiovascular Disease

## 2021-09-02 ENCOUNTER — Ambulatory Visit: Payer: Medicare HMO | Admitting: Cardiovascular Disease

## 2021-09-02 VITALS — BP 122/74 | HR 57 | Ht 61.0 in | Wt 159.2 lb

## 2021-09-02 DIAGNOSIS — I2729 Other secondary pulmonary hypertension: Secondary | ICD-10-CM | POA: Diagnosis not present

## 2021-09-02 DIAGNOSIS — I10 Essential (primary) hypertension: Secondary | ICD-10-CM | POA: Diagnosis not present

## 2021-09-02 MED ORDER — NITROGLYCERIN 0.4 MG SL SUBL: 0.4000 mg | SUBLINGUAL_TABLET | SUBLINGUAL | 3 refills | Status: DC | PRN

## 2021-09-02 MED ORDER — CANDESARTAN CILEXETIL 8 MG PO TABS: ORAL_TABLET | ORAL | 3 refills | Status: DC

## 2021-09-02 MED ORDER — METOPROLOL SUCCINATE ER 50 MG PO TB24: 50.0000 mg | ORAL_TABLET | Freq: Every day | ORAL | 3 refills | Status: DC

## 2021-09-02 NOTE — Patient Instructions (Signed)
Medication Instructions:  ?No changes ?*If you need a refill on your cardiac medications before your next appointment, please call your pharmacy* ? ? ?Lab Work: ?None ordered ?If you have labs (blood work) drawn today and your tests are completely normal, you will receive your results only by: ?MyChart Message (if you have MyChart) OR ?A paper copy in the mail ?If you have any lab test that is abnormal or we need to change your treatment, we will call you to review the results. ? ? ?Testing/Procedures: ?None ordered ? ? ?Follow-Up: ?At [facility], you and your health needs are our priority.  As part of our continuing mission to provide you with exceptional heart care, we have created designated Provider Care Teams.  These Care Teams include your primary Cardiologist (physician) and Advanced Practice Providers (APPs -  Physician Assistants and Nurse Practitioners) who all work together to provide you with the care you need, when you need it. ? ?We recommend signing up for the patient portal called "MyChart".  Sign up information is provided on this After Visit Summary.  MyChart is used to connect with patients for Virtual Visits (Telemedicine).  Patients are able to view lab/test results, encounter notes, upcoming appointments, etc.  Non-urgent messages can be sent to your provider as well.   ?To learn more about what you can do with MyChart, go to [email or web address].   ? ?Your next appointment:   ?Follow up as needed with Dr. Gwenlyn Found ?

## 2021-09-02 NOTE — Progress Notes (Signed)
? ? ? ?09/02/2021 ?Rosemary Hubbard   ?Apr 15, 1951  ?[id number] ? ?Primary Physician [name], MD ?Primary Cardiologist: Lorretta Harp MD Lupe Carney, [place] ? ?HPI:  Emily [name] is a 71 y.o.  mildly overweight married Caucasian female mother of 2 children, grandmother 2 grandchildren who was referred by Dr. Oval Linsey for evaluation of renal vascular hypertension.  She is a retired Research scientist (physical sciences).  Her primary cardiologist is Dr. Aundra Dubin.  I last saw her in the office 05/11/2020.  She is accompanied by her husband Aivars.  risk factors include 70-pack-year tobacco abuse having quit approximately 13 years ago, treated hypertension and hyperlipidemia.  Her father had stents in his 19s.  She is never had a stroke but did have a myocardial infarction back 1/09 and had stenting of OM 3 by Dr. Lia Foyer.  She received the shingles vaccine in June and after that her stable blood pressure became more difficult to manage.  She did develop an ACE cough and was switched to Atacand.  Her beta-blocker was titrated.  Her blood pressures at home of been well controlled.  She has been Educated about dietary modification salt restriction.  Renal Doppler studies performed 02/03/2020 revealed symmetric renal dimensions with a suggestion of left renal artery stenosis with a left renal aortic ratio of 4.42. ? ?Since I saw her a year and a half ago she continues to well.  Her blood pressures are under excellent control on Atacand and metoprolol.  She denies chest pain or shortness of breath. ? ? ?Current Meds  ?Medication Sig  ? Acetylcarnitine HCl (ACETYL L-CARNITINE) 250 MG CAPS Take by mouth. 200 mg every other day  ? atorvastatin (LIPITOR) 80 MG tablet TAKE 1 TABLET BY MOUTH EVERY DAY - NEEDS APPT FOR MORE REFILLS  ? b complex vitamins tablet Take 1 tablet by mouth daily.  ? Biotin 300 MCG TABS Take 600 mcg by mouth every other day.  ? Calcium-Magnesium-Zinc 500-250-12.5 MG TABS Take 1 tablet by mouth every other day.   ? candesartan (ATACAND) 8 MG tablet TAKE 1 TABLET BY MOUTH EVERY DAY *par brand/no substitution*  ? Chromium Picolinate 200 MCG TABS Take 1 tablet by mouth every other day.  ? clobetasol ointment (TEMOVATE) 0.05 % as needed.  ? clopidogrel (PLAVIX) 75 MG tablet TAKE 1 TABLET BY MOUTH EVERY DAY - NEEDS APPT FOR MORE REFILLS  ? Coenzyme Q10 (COQ10) 200 MG CAPS Take 1 capsule by mouth daily.  ? CRANBERRY JUICE EXTRACT PO Take 500 mg by mouth daily.  ? EPINEPHrine 0.3 mg/0.3 mL IJ SOAJ injection INJECT 0.3 MLS INTO THE MUSCLE ONCE FOR 1 DOSE  ? famotidine (PEPCID AC) 10 MG tablet Take 1 tablet (10 mg total) by mouth 2 (two) times daily. Take  1-2 tablets twice daily (Patient taking differently: Take 10 mg by mouth 2 (two) times daily. Take 2 tab in the am,  2 tabs at 8 pm,  and another 1-2 at 3 am)  ? finasteride (PROSCAR) 5 MG tablet Take 5 mg by mouth daily.  ? Flaxseed, Linseed, (FLAX SEED OIL) 1000 MG CAPS Take 1,200 mg by mouth daily.  ? Garlic Oil 086 MG TABS Take 1 tablet by mouth daily.  ? metoprolol succinate (TOPROL-XL) 50 MG 24 hr tablet Take 1 tablet (50 mg total) by mouth daily.  ? nitroGLYCERIN (NITROSTAT) 0.4 MG SL tablet Place 1 tablet (0.4 mg total) under the tongue every 5 (five) minutes x 3 doses as needed for chest pain.  ?  omega-3 acid ethyl esters (LOVAZA) 1 g capsule TAKE 1 CAPSULE BY MOUTH EVERY DAY  ? Probiotic Product (ALIGN PO) Take 1 capsule by mouth daily.  ? Rhodiola 300 MG CAPS Take 1 capsule by mouth daily.    ?  ? ?Allergies  ?Allergen Reactions  ? Benzonatate   ?  Unknown, patient states "it made me sick"  ? Chlorpromazine Hcl   ?  unknown  ? Codeine   ?  REACTION: causes vomiting  ? Doxycycline   ?  "explosive diarrhea" -does not wish to take this anymore  ? Hydrocodone   ?  REACTION: causes vomiing and nausea  ? Hydrocodone-Acetaminophen Nausea And Vomiting  ? Lisinopril Cough  ?  Patient states that she will no longer take this medication because it gave her a bad cough   ? Lovaza  [Omega-3-Acid Ethyl Esters] Other (See Comments)  ?  Loose stools with two capsules- can take 1 capsule with no issues   ? Penicillins   ?  REACTION: causes rash  ? Prochlorperazine Edisylate   ?  unknown ?  ? Promethazine Hcl   ?  unknown  ? Sulfonamide Derivatives   ?  unknown ?  ? ? ?Social History  ? ?Socioeconomic History  ? Marital status: Married  ?  Spouse name: Not on file  ? Number of children: 2  ? Years of education: Not on file  ? Highest education level: Not on file  ?Occupational History  ? Occupation: Risk manager  ?Tobacco Use  ? Smoking status: Former  ?  Packs/day: 2.00  ?  Years: 20.00  ?  Pack years: 40.00  ?  Types: Cigarettes  ?  Quit date: 06/19/2006  ?  Years since quitting: 15.2  ? Smokeless tobacco: Never  ?Vaping Use  ? Vaping Use: Never used  ?Substance and Sexual Activity  ? Alcohol use: Not Currently  ?  Alcohol/week: 0.0 standard drinks  ?  Comment: <2 monthly; rare  ? Drug use: No  ? Sexual activity: Not on file  ?Other Topics Concern  ? Not on file  ?Social History Narrative  ? Resides in [place] with her husband.   ? Husband works in [place] and comes home two to three times a year  ? 2 children who go to [employer]  ? Just got accepted to [employer]  ? Exercise- NO  ? ?Social Determinants of Health  ? ?Financial Resource Strain: Not on file  ?Food Insecurity: Not on file  ?Transportation Needs: Not on file  ?Physical Activity: Not on file  ?Stress: Not on file  ?Social Connections: Not on file  ?Intimate Partner Violence: Not on file  ?  ? ?Review of Systems: ?General: negative for chills, fever, night sweats or weight changes.  ?Cardiovascular: negative for chest pain, dyspnea on exertion, edema, orthopnea, palpitations, paroxysmal nocturnal dyspnea or shortness of breath ?Dermatological: negative for rash ?Respiratory: negative for cough or wheezing ?Urologic: negative for hematuria ?Abdominal: negative for nausea, vomiting, diarrhea, bright red blood per rectum, melena, or  hematemesis ?Neurologic: negative for visual changes, syncope, or dizziness ?All other systems reviewed and are otherwise negative except as noted above. ? ? ? ?Blood pressure 122/74, pulse (!) 57, height '5\' 1"'$  (1.549 m), weight 159 lb 3.2 oz (72.2 kg), SpO2 96 %.  ?General appearance: alert and no distress ?Neck: no adenopathy, no carotid bruit, no JVD, supple, symmetrical, trachea midline, and thyroid not enlarged, symmetric, no tenderness/mass/nodules ?Lungs: clear to auscultation bilaterally ?Heart: regular rate  and rhythm, S1, S2 normal, no murmur, click, rub or gallop ?Extremities: extremities normal, atraumatic, no cyanosis or edema ?Pulses: 2+ and symmetric ?Skin: Skin color, texture, turgor normal. No rashes or lesions ?Neurologic: Grossly normal ? ?EKG sinus bradycardia 57 without ST or T wave changes.  Personally reviewed this EKG. ? ?ASSESSMENT AND PLAN:  ? ?Other secondary pulmonary hypertension ([place]) ?Bev returns today for follow-up.  She was sent to me by Dr. Oval Linsey for evaluation of potential renal vascular hypertension.  She is on Atacand and metoprolol with blood pressure measured today at 122/74 which is representative what her blood pressures are at home.  She did have renal Doppler studies performed 02/03/2020 that showed a left renal aortic ratio 4.42.  Subsequent CTA did not suggest 50% fibromuscular dysplasia in the mid left renal artery.  Given the fact that her blood pressures are under good control and the only 2 medications I do not think she needs intervention.  I will follow her up as needed. ? ? ? ? ?Lorretta Harp MD FACP,FACC,FAHA, FSCAI ?09/02/2021 ?3:42 PM ?

## 2021-09-02 NOTE — Assessment & Plan Note (Signed)
Emily [name] returns today for follow-up.  She was sent to me by Dr. Oval Linsey for evaluation of potential renal vascular hypertension.  She is on Atacand and metoprolol with blood pressure measured today at 122/74 which is representative what her blood pressures are at home.  She did have renal Doppler studies performed 02/03/2020 that showed a left renal aortic ratio 4.42.  Subsequent CTA did not suggest 50% fibromuscular dysplasia in the mid left renal artery.  Given the fact that her blood pressures are under good control and the only 2 medications I do not think she needs intervention.  I will follow her up as needed. ?

## 2021-09-10 DIAGNOSIS — N390 Urinary tract infection, site not specified: Secondary | ICD-10-CM | POA: Diagnosis not present

## 2021-09-22 ENCOUNTER — Ambulatory Visit (HOSPITAL_COMMUNITY)
Admission: RE | Admit: 2021-09-22 | Discharge: 2021-09-22 | Disposition: A | Discharge status: Home / Self Care | Payer: Medicare HMO | Source: Ambulatory Visit | Attending: Internal Medicine | Admitting: Internal Medicine

## 2021-09-22 ENCOUNTER — Other Ambulatory Visit: Payer: Self-pay

## 2021-09-22 ENCOUNTER — Ambulatory Visit (HOSPITAL_BASED_OUTPATIENT_CLINIC_OR_DEPARTMENT_OTHER)
Admission: RE | Admit: 2021-09-22 | Discharge: 2021-09-22 | Disposition: A | Discharge status: Home / Self Care | Payer: Medicare HMO | Source: Ambulatory Visit | Attending: Cardiology | Admitting: Cardiology

## 2021-09-22 ENCOUNTER — Other Ambulatory Visit (HOSPITAL_COMMUNITY): Payer: Self-pay | Admitting: Cardiology

## 2021-09-22 ENCOUNTER — Encounter (HOSPITAL_COMMUNITY): Payer: Self-pay | Admitting: Cardiology

## 2021-09-22 VITALS — BP 118/70 | HR 68 | Wt 159.8 lb

## 2021-09-22 DIAGNOSIS — I2729 Other secondary pulmonary hypertension: Secondary | ICD-10-CM

## 2021-09-22 DIAGNOSIS — Z79899 Other long term (current) drug therapy: Secondary | ICD-10-CM | POA: Insufficient documentation

## 2021-09-22 DIAGNOSIS — I2510 Atherosclerotic heart disease of native coronary artery without angina pectoris: Secondary | ICD-10-CM | POA: Insufficient documentation

## 2021-09-22 DIAGNOSIS — E782 Mixed hyperlipidemia: Secondary | ICD-10-CM | POA: Diagnosis not present

## 2021-09-22 DIAGNOSIS — Z7902 Long term (current) use of antithrombotics/antiplatelets: Secondary | ICD-10-CM | POA: Insufficient documentation

## 2021-09-22 DIAGNOSIS — I10 Essential (primary) hypertension: Secondary | ICD-10-CM | POA: Insufficient documentation

## 2021-09-22 DIAGNOSIS — I701 Atherosclerosis of renal artery: Secondary | ICD-10-CM | POA: Insufficient documentation

## 2021-09-22 DIAGNOSIS — R011 Cardiac murmur, unspecified: Secondary | ICD-10-CM | POA: Diagnosis not present

## 2021-09-22 DIAGNOSIS — I252 Old myocardial infarction: Secondary | ICD-10-CM | POA: Insufficient documentation

## 2021-09-22 DIAGNOSIS — I351 Nonrheumatic aortic (valve) insufficiency: Secondary | ICD-10-CM | POA: Insufficient documentation

## 2021-09-22 DIAGNOSIS — Z7901 Long term (current) use of anticoagulants: Secondary | ICD-10-CM | POA: Insufficient documentation

## 2021-09-22 LAB — BASIC METABOLIC PANEL
Anion gap: 8 (ref 5–15)
BUN: 12 mg/dL (ref 8–23)
CO2: 23 mmol/L (ref 22–32)
Calcium: 9.5 mg/dL (ref 8.9–10.3)
Chloride: 106 mmol/L (ref 98–111)
Creatinine, Ser: 0.54 mg/dL (ref 0.44–1.00)
GFR, Estimated: >60 mL/min (ref >60)
Glucose, Bld: 137 mg/dL — ABNORMAL HIGH (ref 70–99)
Potassium: 3.7 mmol/L (ref 3.5–5.1)
Sodium: 137 mmol/L (ref 135–145)

## 2021-09-22 LAB — CBC
HCT: 42.7 % (ref 36.0–46.0)
Hemoglobin: 14.2 g/dL (ref 12.0–15.0)
MCH: 29.8 pg (ref 26.0–34.0)
MCHC: 33.3 g/dL (ref 30.0–36.0)
MCV: 89.5 fL (ref 80.0–100.0)
Platelets: 250 10*3/uL (ref 150–400)
RBC: 4.77 MIL/uL (ref 3.87–5.11)
RDW: 13 % (ref 11.5–15.5)
WBC: 6.7 10*3/uL (ref 4.0–10.5)
nRBC: 0 % (ref 0.0–0.2)

## 2021-09-22 LAB — LIPID PANEL
Cholesterol: 169 mg/dL (ref 0–200)
HDL: 51 mg/dL (ref >40)
LDL Cholesterol: 78 mg/dL (ref 0–99)
Total CHOL/HDL Ratio: 3.3 RATIO
Triglycerides: 198 mg/dL — ABNORMAL HIGH (ref <150)
VLDL: 40 mg/dL (ref 0–40)

## 2021-09-22 MED ORDER — CLOPIDOGREL BISULFATE 75 MG PO TABS: 75.0000 mg | ORAL_TABLET | Freq: Every day | ORAL | 3 refills | Status: DC

## 2021-09-22 NOTE — Patient Instructions (Addendum)
There has been no changes to your medications. ? ?Labs done today, your results will be available in MyChart, we will contact you for abnormal readings. ? ?You have been Referred to the PREP program at the [facility]. They will call you to arrange your appointments. ? ? ?Your physician recommends that you schedule a follow-up appointment in: 6 months (October 2023)  **please call the office in August to arrange your follow up** ? ?If you have any questions or concerns before your next appointment please send [place] a message through [place] or call our office at [phone].   ? ?TO LEAVE A MESSAGE FOR THE NURSE SELECT OPTION 2, PLEASE LEAVE A MESSAGE INCLUDING: ?YOUR NAME ?DATE OF BIRTH ?CALL BACK NUMBER ?REASON FOR CALL**this is important as we prioritize the call backs ? ?YOU WILL RECEIVE A CALL BACK THE SAME DAY AS LONG AS YOU CALL BEFORE 4:00 PM ? ?At the [facility], you and your health needs are our priority. As part of our continuing mission to provide you with exceptional heart care, we have created designated Provider Care Teams. These Care Teams include your primary Cardiologist (physician) and Advanced Practice Providers (APPs- Physician Assistants and Nurse Practitioners) who all work together to provide you with the care you need, when you need it.  ? ?You may see any of the following providers on your designated Care Team at your next follow up: ?Dr Glori Bickers ?Dr [name] ?Darrick Grinder, NP ?Lyda Jester, PA ?Jessica Milford,NP ?Marlyce Huge, PA ?Audry Riles, PharmD ? ? ?Please be sure to bring in all your medications bottles to every appointment.  ? ?

## 2021-09-23 ENCOUNTER — Other Ambulatory Visit (HOSPITAL_COMMUNITY): Payer: Self-pay | Admitting: *Deleted

## 2021-09-23 ENCOUNTER — Telehealth: Payer: Self-pay

## 2021-09-23 MED ORDER — METOPROLOL SUCCINATE ER 50 MG PO TB24: 50.0000 mg | ORAL_TABLET | Freq: Every day | ORAL | 3 refills | Status: DC

## 2021-09-23 NOTE — Telephone Encounter (Signed)
Called to discuss PREP program referral; left voicemail. 

## 2021-09-23 NOTE — Progress Notes (Signed)
?  ? ? ?Date:  09/23/2021  ? ?IDMekaela [name], DOB 1950-09-22, MRN [id number]   ?Provider location: [place] [facility] ?Type of Visit: Established patient  ? ?PCP:  [name], MD  ?Cardiologist:  Dr. Aundra Dubin ?  ?History of Present Illness: ?Emily [name] is a 71 y.o. female who has a history of CAD s/p inferior MI in 1/09 treated with PCI to OM3.  She has had no cardiac events since that time.  Echo in 9/16 showed EF 55-60% with mild aortic insufficiency.  ? ?With elevated BP readings, she was referred to HTN clinic.  Her BP is now controlled on Toprol XL + candesartan.  Renal artery dopplers in 8/21 were concerning for significant left renal artery stenosis.  She saw Dr Gwenlyn Found, and it was decided to follow this conservatively.  Repeat renal artery dopplers in 9/22 again showed >60% left renal artery stenosis, but BP is now significantly improved.  ? ?Echo was done today and reviewed, EF 55-60%, normal RV, mild-moderate AS with mean gradient 10 mmHg, mild AI.   ? ?She presents today for followup of CAD and HTN.  No complaints today.  No chest pain or dyspnea with exertion.  BP is now controlled.  She plans to start Silver Sneakers at [facility].  ?   ?Labs (12/14): LDL 90, HDL 41 ?Labs (6/15): LDL 106, HDL 48 ?Labs (7/16): LDL 102 ?Labs (2/17): K 4.4, creatinine 0.65, LDL 123, HDL 49, TGs 266 ?Labs (8/17): LDL 85, TGs 165, HDL 52 ?Labs (9/18): K 3.4, creatinine 0.66 ?Labs (1/19): LDL 101, HDL 50 ?Labs (8/19): LDL 104 ?Labs (1/20): K 3.7, creatinine 0.65 ?Labs (6/21): K 3.7, creatinine 0.68, hgb 15, TSH nl ?Labs (7/21): K 3.8, creatinine 0.68, LDL 91, TGs 108 ?Labs (6/22): LDL 79, K 3.8, creatinine 0.68 ? ?ECG (personally reviewed): NSR 57, normal ? ?PMH: ?1. CAD: Inferior MI in 1/09 with culprit vessel a large OM3.  This was treated with PCI.  Had residual 50-70% mLAD stenosis.  Echo (3/14) with EF 60-65%, mild-moderate AI ?- Echo (9/16): EF 55-60%, inferoseptal akinesis, mild AI.  ?-  Echo (4/23): EF 55-60%, normal RV, mild-moderate AS with mean gradient 10 mmHg, mild AI.   ?2. HTN ?- Renal artery dopplers (8/21): >60% left renal artery stenosis.  ?- Renal artery dopplers (9/22): >60% left RA stenosis.  ?3. H/o fen-phen use ?4. Aortic insufficiency: Mild to moderate on echo in 3/14. Mild on 9/16 echo.  ?5. Carotid dopplers (9/12) with mild plaque.  ?- Carotid dopplers (9/21): mild BICA stenosis.  ?6. Hyperlipidemia ?7. GERD ?8. Aortic valve disorder: Mild-moderate AS, mild AI on 4/23 echo.  ? ?Current Outpatient Medications  ?Medication Sig Dispense Refill  ? Acetylcarnitine HCl (ACETYL L-CARNITINE) 250 MG CAPS Take by mouth. 200 mg every other day    ? ALPRAZolam (XANAX) 0.25 MG tablet Take 1 tablet (0.25 mg total) by mouth 2 (two) times daily as needed for anxiety. 30 tablet 1  ? b complex vitamins tablet Take 1 tablet by mouth daily.    ? Biotin 300 MCG TABS Take 600 mcg by mouth every other day.    ? Calcium-Magnesium-Zinc 500-250-12.5 MG TABS Take 1 tablet by mouth every other day.    ? candesartan (ATACAND) 8 MG tablet TAKE 1 TABLET BY MOUTH EVERY DAY *par brand/no substitution* 90 tablet 3  ? Chromium Picolinate 200 MCG TABS Take 1 tablet by mouth every other day.    ? clobetasol ointment (TEMOVATE)  0.05 % as needed.    ? Coenzyme Q10 (COQ10) 200 MG CAPS Take 1 capsule by mouth daily.    ? CRANBERRY JUICE EXTRACT PO Take 500 mg by mouth daily.    ? EPINEPHrine 0.3 mg/0.3 mL IJ SOAJ injection INJECT 0.3 MLS INTO THE MUSCLE ONCE FOR 1 DOSE  0  ? famotidine (PEPCID AC) 10 MG tablet Take 1 tablet (10 mg total) by mouth 2 (two) times daily. Take  1-2 tablets twice daily 180 tablet 3  ? finasteride (PROSCAR) 5 MG tablet Take 5 mg by mouth daily.    ? Flaxseed, Linseed, (FLAX SEED OIL) 1000 MG CAPS Take 1,200 mg by mouth daily.    ? Garlic Oil 295 MG TABS Take 1 tablet by mouth daily.    ? nitroGLYCERIN (NITROSTAT) 0.4 MG SL tablet Place 1 tablet (0.4 mg total) under the tongue every 5 (five)  minutes x 3 doses as needed for chest pain. 25 tablet 3  ? omega-3 acid ethyl esters (LOVAZA) 1 g capsule TAKE 1 CAPSULE BY MOUTH EVERY DAY 90 capsule 1  ? omeprazole (PRILOSEC) 20 MG capsule Take 1 capsule (20 mg total) by mouth daily as needed. 30 capsule 3  ? Probiotic Product (ALIGN PO) Take 1 capsule by mouth daily.    ? Rhodiola 300 MG CAPS Take 1 capsule by mouth daily.      ? sucralfate (CARAFATE) 1 GM/10ML suspension Take 10 mLs (1 g total) by mouth every 8 (eight) hours as needed. 420 mL 1  ? triamcinolone (NASACORT) 55 MCG/ACT AERO nasal inhaler     ? atorvastatin (LIPITOR) 80 MG tablet TAKE 1 TABLET BY MOUTH EVERY DAY 90 tablet 3  ? clopidogrel (PLAVIX) 75 MG tablet Take 1 tablet (75 mg total) by mouth daily. 90 tablet 3  ? metoprolol succinate (TOPROL-XL) 50 MG 24 hr tablet Take 1 tablet (50 mg total) by mouth daily. 90 tablet 3  ? ?No current facility-administered medications for this encounter.  ? ? ?Allergies:   Benzonatate, Chlorpromazine hcl, Codeine, Doxycycline, Hydrocodone, Hydrocodone-acetaminophen, Lisinopril, Lovaza [omega-3-acid ethyl esters], Penicillins, Prochlorperazine edisylate, Promethazine hcl, and Sulfonamide derivatives  ? ?Social History:  The patient  reports that she quit smoking about 15 years ago. Her smoking use included cigarettes. She has a 40.00 pack-year smoking history. She has never used smokeless tobacco. She reports that she does not currently use alcohol. She reports that she does not use drugs.  ? ?Family History:  The patient's family history includes Allergy (severe) in her father; Esophageal cancer in her maternal grandfather; Heart disease in her father; Leukemia in her father; Renal Disease in her paternal grandfather; Skin cancer in her mother.  ? ?ROS:  Please see the history of present illness.   All other systems are personally reviewed and negative.  ? ?Exam:   ?BP 118/70   Pulse 68   Wt 72.5 kg (159 lb 12.8 oz)   SpO2 94%   BMI 30.19 kg/m?   ?General:  NAD ?Neck: No JVD, no thyromegaly or thyroid nodule.  ?Lungs: Clear to auscultation bilaterally with normal respiratory effort. ?CV: Nondisplaced PMI.  Heart regular S1/S2, no S3/S4, 1/6 SEM RUSB.  No peripheral edema.  No carotid bruit.  Normal pedal pulses.  ?Abdomen: Soft, nontender, no hepatosplenomegaly, no distention.  ?Skin: Intact without lesions or rashes.  ?Neurologic: Alert and oriented x 3.  ?Psych: Normal affect. ?Extremities: No clubbing or cyanosis.  ?HEENT: Normal.  ? ?Recent Labs: ?12/15/2020: ALT 26; TSH 2.91 ?  09/22/2021: BUN 12; Creatinine, Ser 0.54; Hemoglobin 14.2; Platelets 250; Potassium 3.7; Sodium 137  ?Personally reviewed  ? ?Wt Readings from Last 3 Encounters:  ?09/22/21 72.5 kg (159 lb 12.8 oz)  ?09/02/21 72.2 kg (159 lb 3.2 oz)  ?07/08/21 70.9 kg (156 lb 6.4 oz)  ?  ?ASSESSMENT AND PLAN: ? ?1. CAD: Stable with no ischemic symptoms.  She had PCI in 1/09 in the setting of inferior MI.  She had residual untreated 50-70% mLAD stenosis.  No chest pain.  ?- Continue Toprol XL and statin. ?- She has been on Plavix long-term with no evidence for GI bleeding.   ?2. Hyperlipidemia: Goal LDL < 70.  LDL has been > goal on atorvastatin 80 mg daily. She cannot tolerate Crestor 40 mg daily.  She has not wanted to take Zetia.   ?- Check lipids today, would recommend Repatha if LDL elevated on atorvastatin.  ?3. Aortic insufficiency/stenosis: Echo today with mild AI, mild-moderate AS.  ?4. HTN: Possibly related to left renal artery stenosis.  However, currently controlled on Toprol XL and candesartan.  ?- Continue Toprol XL and candesartan. BMET today.  ?- Has been seen by Dr. Gwenlyn Found, plan medical management of RAS for now.  ? ?Followup 6 months  ? ?Signed, ?[name], MD  ?09/23/2021 ? ?[facility] ?[place] ?[address] ?[facility] ?[place] [address] ?[phone] (office) ?[phone] (fax) ?

## 2021-10-18 ENCOUNTER — Telehealth: Payer: Self-pay | Admitting: Cardiovascular Disease

## 2021-10-18 NOTE — Telephone Encounter (Signed)
LMTCB

## 2021-10-18 NOTE — Telephone Encounter (Signed)
Pt c/o BP issue: STAT if pt c/o blurred vision, one-sided weakness or slurred speech ? ?1. What are your last 5 BP readings? 185/94, day before midnight top number was 170 ? ?2. Are you having any other symptoms (ex. Dizziness, headache, blurred vision, passed out)? No  ? ?3. What is your BP issue? Patient states her BP has been getting high again and has mainly see it do this at night. She says she is not sure if she needs to add a blood pressure medication at night. She says she did take one at night before, but it gave her acid reflux so she takes it in the morning. She says she was also previously told to take xanax if her BP went up to 170 and has taken it and it does help to being down her BP.  ?

## 2021-10-19 ENCOUNTER — Telehealth: Payer: Self-pay | Admitting: Cardiovascular Disease

## 2021-10-19 NOTE — Telephone Encounter (Signed)
Patient is calling stating she did not get a notification of the callback from the RN yesterday regarding her BP. Please advise.  ?

## 2021-10-19 NOTE — Telephone Encounter (Signed)
Left message to call back  

## 2021-10-20 NOTE — Telephone Encounter (Signed)
Spoke with pt regarding recent issues with her blood pressure. Pt was seen for an office visit with Dr. Gwenlyn Found 09/02/21 and then in the heart failure clinic on 09/22/21. At both of these appointments pt had good blood pressures and reported no issued with her blood pressure. Pt states that over the last 5 days she has been having a hard time with increased blood pressure, pt states blood pressure has been between 007-121 systolic when high. Pt reports no changes to medication, diet, or hydration. Pt has not experienced chest pain, dizziness or headache during times of high blood pressure. Pt is unsure of what to do at this point. Pt offered appointment with either Dr. Gwenlyn Found or pharmD for tomorrow. Pt think that pharmD appointment would be best, pt scheduled for 5/5 at 2:30pm. Advised pt to bring her blood pressure log and cuff with her to this appointment. Pt verbalizes understanding. ? ? ? ? ?Pt voices her frustration because despite our efforts to call her she did not receive either voicemail that is documented.  ?

## 2021-10-20 NOTE — Telephone Encounter (Signed)
Multiple encounters. See additional telephone encounter. ?

## 2021-10-21 ENCOUNTER — Encounter: Payer: Self-pay | Admitting: Pharmacist Clinician (PhC)/ Clinical Pharmacy Specialist

## 2021-10-21 ENCOUNTER — Ambulatory Visit: Payer: Medicare HMO | Admitting: Pharmacist Clinician (PhC)/ Clinical Pharmacy Specialist

## 2021-10-21 DIAGNOSIS — I10 Essential (primary) hypertension: Secondary | ICD-10-CM

## 2021-10-21 NOTE — Progress Notes (Signed)
? ? ? ?10/24/2021 ?Emily [name] ?December 22, 1950 ?[id number] ? ? ?HPI:  Emily [name] is a 71 y.o. female patient of Dr Gwenlyn Found, with a [place] below who presents today for hypertension clinic evaluation.  Back in 2021 she was referred to the advanced hypertension clinic for labile blood pressure readings, and it was noted that stress affected her readings.  In doing secondary workup, renal dopplers showed 1-59% blockage of right renal artery and > 60% in left.  A follow up CT suggested beaded configuration compatible with fibromuscular dysplasia with 50-60% left blockage.  She has been on metoprolol succ and candesartan for a few years now and tolerates both without issue.  Per patient her pressures were well controlled until she had her shingles vaccines, and have been hard to manage since.   She is in the office today with her husband.  She feels that her BP was good for the last few weeks, then increased about May 1.  When her pressure goes up, she starts checking every few hours, had 10-11 checks daily for three days.  Then starts to check a little less frequently as the numbers start to go down.  Only checked 7 times yesterday and 3 so far today.   ? ?Past Medical History: ?Carotid stenosis 9/21 1-39% stenosis bilaterally  ?hyperlipidemia 4/23 LDL 79 on atorvastatin 80  ?DM2 6/22 A1c 6.3, down from 6.7 (2017)  ?Fibromuscular dysplasia With 60% stenosis in Left renal artery  ?  ? ?Blood Pressure Goal:  130/80 ? ?Current Medications: candesartan 8 mg qd, metoprolol succ 50 mg qd  ? ?Family Hx: father died at 69 (last year) ckd, GI bowel problems, mom at 50 from brain hemorrhage, had enlarged heart 2/2 hypertension (only weighed ~100 lb); 2 children in 4's healthy ? ?Social Hx: no tobacco, quit in 08; occasional alcohol - maybe once a month at most; no coffee, 6 oz coke in the am; otherwise water.  Hot tea if feeling sick ? ?Diet: mix of home and restaurant; does use some salt (light); salad and meat most nights;  peanut butter crackers;  ? ?Exercise: no regular exercise, hoping to join silver sneakers soon ? ?Home BP readings: checked 3-11 times daily; as pressure started to decrease would check less frequently; systolic as high as 160 ? ?Intolerances: ACEI - cough, omega 3 oils - diarrhea in larger doses ? ?Labs: 4/23: Na 137, K 3.7, Glu 137, BUN 12, SCr 0.54, GFR > 60 ? ? ?Wt Readings from Last 3 Encounters:  ?10/21/21 157 lb 6.4 oz (71.4 kg)  ?09/22/21 159 lb 12.8 oz (72.5 kg)  ?09/02/21 159 lb 3.2 oz (72.2 kg)  ? ?BP Readings from Last 3 Encounters:  ?10/21/21 (!) 141/65  ?09/22/21 118/70  ?09/02/21 122/74  ? ?Pulse Readings from Last 3 Encounters:  ?10/21/21 61  ?09/22/21 68  ?09/02/21 (!) 57  ? ? ?Current Outpatient Medications  ?Medication Sig Dispense Refill  ? Acetylcarnitine HCl (ACETYL L-CARNITINE) 250 MG CAPS Take by mouth. 200 mg every other day    ? ALPRAZolam (XANAX) 0.25 MG tablet Take 1 tablet (0.25 mg total) by mouth 2 (two) times daily as needed for anxiety. 30 tablet 1  ? atorvastatin (LIPITOR) 80 MG tablet TAKE 1 TABLET BY MOUTH EVERY DAY 90 tablet 3  ? b complex vitamins tablet Take 1 tablet by mouth daily.    ? Biotin 300 MCG TABS Take 600 mcg by mouth every other day.    ? Calcium-Magnesium-Zinc 500-250-12.5 MG TABS Take 1  tablet by mouth every other day.    ? candesartan (ATACAND) 8 MG tablet TAKE 1 TABLET BY MOUTH EVERY DAY *par brand/no substitution* 90 tablet 3  ? Cholecalciferol (VITAMIN D) 50 MCG (2000 UT) CAPS Take 2,000 Units by mouth daily.    ? Chromium Picolinate 200 MCG TABS Take 1 tablet by mouth every other day.    ? clopidogrel (PLAVIX) 75 MG tablet Take 1 tablet (75 mg total) by mouth daily. 90 tablet 3  ? Coenzyme Q10 (COQ10) 200 MG CAPS Take 1 capsule by mouth daily.    ? CRANBERRY JUICE EXTRACT PO Take 500 mg by mouth daily.    ? EPINEPHrine 0.3 mg/0.3 mL IJ SOAJ injection INJECT 0.3 MLS INTO THE MUSCLE ONCE FOR 1 DOSE  0  ? famotidine (PEPCID AC) 10 MG tablet Take 1 tablet (10 mg  total) by mouth 2 (two) times daily. Take  1-2 tablets twice daily 180 tablet 3  ? finasteride (PROSCAR) 5 MG tablet Take 5 mg by mouth daily.    ? Flaxseed, Linseed, (FLAX SEED OIL) 1000 MG CAPS Take 1,200 mg by mouth daily.    ? Garlic Oil 751 MG TABS Take 1 tablet by mouth daily.    ? metoprolol succinate (TOPROL-XL) 50 MG 24 hr tablet Take 1 tablet (50 mg total) by mouth daily. 90 tablet 3  ? nitroGLYCERIN (NITROSTAT) 0.4 MG SL tablet Place 1 tablet (0.4 mg total) under the tongue every 5 (five) minutes x 3 doses as needed for chest pain. 25 tablet 3  ? omega-3 acid ethyl esters (LOVAZA) 1 g capsule TAKE 1 CAPSULE BY MOUTH EVERY DAY 90 capsule 1  ? OVER THE COUNTER MEDICATION Take 1,000 mg by mouth in the morning and at bedtime. Beetroot 1000 mg capsules    ? Probiotic Product (ALIGN PO) Take 1 capsule by mouth daily.    ? Rhodiola 300 MG CAPS Take 1 capsule by mouth daily.      ? vitamin C (ASCORBIC ACID) 500 MG tablet Take 1,500 mg by mouth daily.    ? vitamin E 180 MG (400 UNITS) capsule Take 400 Units by mouth every other day.    ? clobetasol ointment (TEMOVATE) 0.05 % as needed.    ? omeprazole (PRILOSEC) 20 MG capsule Take 1 capsule (20 mg total) by mouth daily as needed. 30 capsule 3  ? sucralfate (CARAFATE) 1 GM/10ML suspension Take 10 mLs (1 g total) by mouth every 8 (eight) hours as needed. 420 mL 1  ? triamcinolone (NASACORT) 55 MCG/ACT AERO nasal inhaler     ? ?No current facility-administered medications for this visit.  ? ? ?Allergies  ?Allergen Reactions  ? Benzonatate   ?  Unknown, patient states "it made me sick"  ? Chlorpromazine Hcl   ?  unknown  ? Codeine   ?  REACTION: causes vomiting  ? Doxycycline   ?  "explosive diarrhea" -does not wish to take this anymore  ? Hydrocodone   ?  REACTION: causes vomiing and nausea  ? Hydrocodone-Acetaminophen Nausea And Vomiting  ? Lisinopril Cough  ?  Patient states that she will no longer take this medication because it gave her a bad cough   ? Lovaza  [Omega-3-Acid Ethyl Esters] Other (See Comments)  ?  Loose stools with two capsules- can take 1 capsule with no issues   ? Penicillins   ?  REACTION: causes rash  ? Prochlorperazine Edisylate   ?  unknown ?  ? Promethazine Hcl   ?  unknown  ? Sulfonamide Derivatives   ?  unknown ?  ? ? ?Past Medical History:  ?Diagnosis Date  ? Allergy   ? Anxiety   ? no anxiety per pt- she wants removed   ? CAD (coronary artery disease)   ? Cataract   ? forming   ? Chronic insomnia   ? Colon polyp   ? Depression   ? GERD (gastroesophageal reflux disease)   ? GERD with esophagitis   ? Heart attack ([place]) 06/2007  ? Heart murmur   ? Hyperlipemia   ? Hypertension   ? Osteopenia   ? Perirectal abscess   ? Shingles 08/31/2017  ? 2nd dx with shingles, per pt- shingles x 4 - severe rxn to shingles vax - last shingles 05-2021  ? ? ?Blood pressure (!) 141/65, pulse 61, height '5\' 1"'$  (1.549 m), weight 157 lb 6.4 oz (71.4 kg). ? ?Essential hypertension ?Patient with essential hypertension, not at goal in office.  Reviewed BP goals and need to avoid multiple BP checks each day.  Explained that continued checks leads to increased stress, and therefore readings don't drop.  Stressed that she need to check pressure only twice daily, regardless of the readings.  Explained that occasional high readings can be due to family stress (she apparently has plenty of this) as well as what she ate the day prior.  Reviewed sodium impact on BP and suggested she note when eating out or overly salty foods (pizza, tacos, etc...) to see if there is a connection.  Will have her take metoprolol and candesartan at opposite times of the day and see her back in one month for follow up.   ? ? ?Tommy Medal PharmD CPP [facility] ?[place] ?[address] ?[place], [place] [address] ?[phone] ?

## 2021-10-21 NOTE — Patient Instructions (Signed)
Return for a a follow up appointment Tuesday Juen 6 at 2:30 pm ? ?Check your blood pressure at home no more than twice daily and keep record of the readings. ? ?Take your BP meds as follows: ? Continue with candesartan and metoprolol.  Try taking them at opposite times of the day.   ? ?Bring all of your meds, your BP cuff and your record of home blood pressures to your next appointment.  Exercise as you?re able, try to walk approximately 30 minutes per day.  Keep salt intake to a minimum, especially watch canned and prepared boxed foods.  Eat more fresh fruits and vegetables and fewer canned items.  Avoid eating in fast food restaurants.  ? ? HOW TO TAKE YOUR BLOOD PRESSURE: ?Rest 5 minutes before taking your blood pressure. ? Don?t smoke or drink caffeinated beverages for at least 30 minutes before. ?Take your blood pressure before (not after) you eat. ?Sit comfortably with your back supported and both feet on the floor (don?t cross your legs). ?Elevate your arm to heart level on a table or a desk. ?Use the proper sized cuff. It should fit smoothly and snugly around your bare upper arm. There should be enough room to slip a fingertip under the cuff. The bottom edge of the cuff should be 1 inch above the crease of the elbow. ?Ideally, take 3 measurements at one sitting and record the average ?

## 2021-10-24 DIAGNOSIS — R071 Chest pain on breathing: Secondary | ICD-10-CM | POA: Diagnosis not present

## 2021-10-24 NOTE — Assessment & Plan Note (Signed)
Patient with essential hypertension, not at goal in office.  Reviewed BP goals and need to avoid multiple BP checks each day.  Explained that continued checks leads to increased stress, and therefore readings don't drop.  Stressed that she need to check pressure only twice daily, regardless of the readings.  Explained that occasional high readings can be due to family stress (she apparently has plenty of this) as well as what she ate the day prior.  Reviewed sodium impact on BP and suggested she note when eating out or overly salty foods (pizza, tacos, etc...) to see if there is a connection.  Will have her take metoprolol and candesartan at opposite times of the day and see her back in one month for follow up.   ?

## 2021-11-21 DIAGNOSIS — M7501 Adhesive capsulitis of right shoulder: Secondary | ICD-10-CM | POA: Diagnosis not present

## 2021-11-22 ENCOUNTER — Encounter: Payer: Medicare HMO | Admitting: Pharmacist

## 2021-11-22 NOTE — Progress Notes (Signed)
Patient ID: Emily [name]                 DOB: 09-15-1950                      MRN: [id number]      Current HTN meds:  Previously tried:  BP goal:   Family History:   Social History:   Diet:   Exercise:   Home BP readings:   Wt Readings from Last 3 Encounters:  10/21/21 157 lb 6.4 oz (71.4 kg)  09/22/21 159 lb 12.8 oz (72.5 kg)  09/02/21 159 lb 3.2 oz (72.2 kg)   BP Readings from Last 3 Encounters:  10/21/21 (!) 141/65  09/22/21 118/70  09/02/21 122/74   Pulse Readings from Last 3 Encounters:  10/21/21 61  09/22/21 68  09/02/21 (!) 57    Renal function: CrCl cannot be calculated (Patient's most recent lab result is older than the maximum 21 days allowed.).  Past Medical History:  Diagnosis Date   Allergy    Anxiety    no anxiety per pt- she wants removed    CAD (coronary artery disease)    Cataract    forming    Chronic insomnia    Colon polyp    Depression    GERD (gastroesophageal reflux disease)    GERD with esophagitis    Heart attack ([place]) 06/2007   Heart murmur    Hyperlipemia    Hypertension    Osteopenia    Perirectal abscess    Shingles 08/31/2017   2nd dx with shingles, per pt- shingles x 4 - severe rxn to shingles vax - last shingles 05-2021    Current Outpatient Medications on File Prior to Visit  Medication Sig Dispense Refill   Acetylcarnitine HCl (ACETYL L-CARNITINE) 250 MG CAPS Take by mouth. 200 mg every other day     ALPRAZolam (XANAX) 0.25 MG tablet Take 1 tablet (0.25 mg total) by mouth 2 (two) times daily as needed for anxiety. 30 tablet 1   atorvastatin (LIPITOR) 80 MG tablet TAKE 1 TABLET BY MOUTH EVERY DAY 90 tablet 3   b complex vitamins tablet Take 1 tablet by mouth daily.     Biotin 300 MCG TABS Take 600 mcg by mouth every other day.     Calcium-Magnesium-Zinc 500-250-12.5 MG TABS Take 1 tablet by mouth every other day.     candesartan (ATACAND) 8 MG tablet TAKE 1 TABLET BY MOUTH EVERY DAY *par brand/no substitution*  90 tablet 3   Cholecalciferol (VITAMIN D) 50 MCG (2000 UT) CAPS Take 2,000 Units by mouth daily.     Chromium Picolinate 200 MCG TABS Take 1 tablet by mouth every other day.     clobetasol ointment (TEMOVATE) 0.05 % as needed.     clopidogrel (PLAVIX) 75 MG tablet Take 1 tablet (75 mg total) by mouth daily. 90 tablet 3   Coenzyme Q10 (COQ10) 200 MG CAPS Take 1 capsule by mouth daily.     CRANBERRY JUICE EXTRACT PO Take 500 mg by mouth daily.     EPINEPHrine 0.3 mg/0.3 mL IJ SOAJ injection INJECT 0.3 MLS INTO THE MUSCLE ONCE FOR 1 DOSE  0   famotidine (PEPCID AC) 10 MG tablet Take 1 tablet (10 mg total) by mouth 2 (two) times daily. Take  1-2 tablets twice daily 180 tablet 3   finasteride (PROSCAR) 5 MG tablet Take 5 mg by mouth daily.     Flaxseed, Linseed, (FLAX SEED OIL)  1000 MG CAPS Take 1,200 mg by mouth daily.     Garlic Oil [id number] MG TABS Take 1 tablet by mouth daily.     metoprolol succinate (TOPROL-XL) 50 MG 24 hr tablet Take 1 tablet (50 mg total) by mouth daily. 90 tablet 3   nitroGLYCERIN (NITROSTAT) 0.4 MG SL tablet Place 1 tablet (0.4 mg total) under the tongue every 5 (five) minutes x 3 doses as needed for chest pain. 25 tablet 3   omega-3 acid ethyl esters (LOVAZA) 1 g capsule TAKE 1 CAPSULE BY MOUTH EVERY DAY 90 capsule 1   omeprazole (PRILOSEC) 20 MG capsule Take 1 capsule (20 mg total) by mouth daily as needed. 30 capsule 3   OVER THE COUNTER MEDICATION Take 1,000 mg by mouth in the morning and at bedtime. Beetroot 1000 mg capsules     Probiotic Product (ALIGN PO) Take 1 capsule by mouth daily.     Rhodiola 300 MG CAPS Take 1 capsule by mouth daily.       sucralfate (CARAFATE) 1 GM/10ML suspension Take 10 mLs (1 g total) by mouth every 8 (eight) hours as needed. 420 mL 1   triamcinolone (NASACORT) 55 MCG/ACT AERO nasal inhaler      vitamin C (ASCORBIC ACID) 500 MG tablet Take 1,500 mg by mouth daily.     vitamin E 180 MG (400 UNITS) capsule Take 400 Units by mouth every other  day.     No current facility-administered medications on file prior to visit.    Allergies  Allergen Reactions   Benzonatate     Unknown, patient states "it made me sick"   Chlorpromazine Hcl     unknown   Codeine     REACTION: causes vomiting   Doxycycline     "explosive diarrhea" -does not wish to take this anymore   Hydrocodone     REACTION: causes vomiing and nausea   Hydrocodone-Acetaminophen Nausea And Vomiting   Lisinopril Cough    Patient states that she will no longer take this medication because it gave her a bad cough    Lovaza [Omega-3-Acid Ethyl Esters] Other (See Comments)    Loose stools with two capsules- can take 1 capsule with no issues    Penicillins     REACTION: causes rash   Prochlorperazine Edisylate     unknown    Promethazine Hcl     unknown   Sulfonamide Derivatives     unknown      Assessment/Plan:  1. Hypertension -  This encounter was created in error - please disregard.

## 2021-11-24 ENCOUNTER — Ambulatory Visit (INDEPENDENT_AMBULATORY_CARE_PROVIDER_SITE_OTHER): Payer: Medicare HMO | Admitting: Pharmacist Clinician (PhC)/ Clinical Pharmacy Specialist

## 2021-11-24 DIAGNOSIS — I10 Essential (primary) hypertension: Secondary | ICD-10-CM | POA: Diagnosis not present

## 2021-11-24 MED ORDER — CANDESARTAN CILEXETIL 16 MG PO TABS: 16.0000 mg | ORAL_TABLET | Freq: Every day | ORAL | 3 refills | Status: DC

## 2021-11-24 NOTE — Progress Notes (Signed)
11/24/2021 Emily [name] 1951-01-22 [id number]   HPI:  Emily [name] is a 71 y.o. female patient of Dr Gwenlyn Found, with a PMH below who presents today for hypertension clinic evaluation.  Back in 2021 she was referred to the advanced hypertension clinic for labile blood pressure readings, and it was noted that stress affected her readings.  In doing secondary workup, renal dopplers showed 1-59% blockage of right renal artery and > 60% in left.  A follow up CT suggested beaded configuration compatible with fibromuscular dysplasia with 50-60% left blockage.  She has been on metoprolol succ and candesartan for a few years now and tolerates both without issue.  Per patient her pressures were well controlled until she had her shingles vaccines, and have been hard to manage since.   She is in the office today with her husband.  She feels that her BP was good for the last few weeks, then increased about May 1.  When her pressure goes up, she starts checking every few hours, had 10-11 checks daily for three days.  At her visit last month stressed need to check BP only twice daily, regardless of reading, and divided metoprolol and candesartan to different times of the day.    She returns today for follow up.   Takes candesartan at 2am, metoprolol 12-2 pm Seeing ortho for frozen shoulder  Past Medical History: Carotid stenosis 9/21 1-39% stenosis bilaterally  hyperlipidemia 4/23 LDL 79 on atorvastatin 80  DM2 6/22 A1c 6.3, down from 6.7 (2017)  Fibromuscular dysplasia With 60% stenosis in Left renal artery     Blood Pressure Goal:  130/80  Current Medications: candesartan 8 mg qd pm;  metoprolol succ 50 mg qd am  Family Hx: father died at 13 (last year) ckd, GI bowel problems, mom at 36 from brain hemorrhage, had enlarged heart 2/2 hypertension (only weighed ~100 lb); 2 children in 81's healthy  Social Hx: no tobacco, quit in 08; occasional alcohol - maybe once a month at most; no coffee, 6  oz coke in the am; otherwise water.  Hot tea if feeling sick  Diet: mix of home and restaurant; does use some salt (light); salad and meat most nights; peanut butter crackers;   Exercise: no regular exercise, hoping to join silver sneakers soon  Home BP readings: checked 3-11 times daily; as pressure started to decrease would check less frequently; systolic as high as 244  Intolerances: ACEI - cough, omega 3 oils - diarrhea in larger doses  Labs: 4/23: Na 137, K 3.7, Glu 137, BUN 12, SCr 0.54, GFR > 60   Wt Readings from Last 3 Encounters:  11/22/21 156 lb 11.2 oz (71.1 kg)  10/21/21 157 lb 6.4 oz (71.4 kg)  09/22/21 159 lb 12.8 oz (72.5 kg)   BP Readings from Last 3 Encounters:  11/22/21 120/70  10/21/21 (!) 141/65  09/22/21 118/70   Pulse Readings from Last 3 Encounters:  11/22/21 61  10/21/21 61  09/22/21 68    Current Outpatient Medications  Medication Sig Dispense Refill   Acetylcarnitine HCl (ACETYL L-CARNITINE) 250 MG CAPS Take by mouth. 200 mg every other day     ALPRAZolam (XANAX) 0.25 MG tablet Take 1 tablet (0.25 mg total) by mouth 2 (two) times daily as needed for anxiety. 30 tablet 1   atorvastatin (LIPITOR) 80 MG tablet TAKE 1 TABLET BY MOUTH EVERY DAY 90 tablet 3   b complex vitamins tablet Take 1 tablet by mouth daily.  Biotin 300 MCG TABS Take 600 mcg by mouth every other day.     Calcium-Magnesium-Zinc 500-250-12.5 MG TABS Take 1 tablet by mouth every other day.     candesartan (ATACAND) 8 MG tablet TAKE 1 TABLET BY MOUTH EVERY DAY *par brand/no substitution* 90 tablet 3   Cholecalciferol (VITAMIN D) 50 MCG (2000 UT) CAPS Take 2,000 Units by mouth daily.     Chromium Picolinate 200 MCG TABS Take 1 tablet by mouth every other day.     clobetasol ointment (TEMOVATE) 0.05 % as needed.     clopidogrel (PLAVIX) 75 MG tablet Take 1 tablet (75 mg total) by mouth daily. 90 tablet 3   Coenzyme Q10 (COQ10) 200 MG CAPS Take 1 capsule by mouth daily.     CRANBERRY  JUICE EXTRACT PO Take 500 mg by mouth daily.     EPINEPHrine 0.3 mg/0.3 mL IJ SOAJ injection INJECT 0.3 MLS INTO THE MUSCLE ONCE FOR 1 DOSE  0   famotidine (PEPCID AC) 10 MG tablet Take 1 tablet (10 mg total) by mouth 2 (two) times daily. Take  1-2 tablets twice daily 180 tablet 3   finasteride (PROSCAR) 5 MG tablet Take 5 mg by mouth daily.     Flaxseed, Linseed, (FLAX SEED OIL) 1000 MG CAPS Take 1,200 mg by mouth daily.     Garlic Oil 532 MG TABS Take 1 tablet by mouth daily.     metoprolol succinate (TOPROL-XL) 50 MG 24 hr tablet Take 1 tablet (50 mg total) by mouth daily. 90 tablet 3   nitroGLYCERIN (NITROSTAT) 0.4 MG SL tablet Place 1 tablet (0.4 mg total) under the tongue every 5 (five) minutes x 3 doses as needed for chest pain. 25 tablet 3   omega-3 acid ethyl esters (LOVAZA) 1 g capsule TAKE 1 CAPSULE BY MOUTH EVERY DAY 90 capsule 1   omeprazole (PRILOSEC) 20 MG capsule Take 1 capsule (20 mg total) by mouth daily as needed. 30 capsule 3   OVER THE COUNTER MEDICATION Take 1,000 mg by mouth in the morning and at bedtime. Beetroot 1000 mg capsules     Probiotic Product (ALIGN PO) Take 1 capsule by mouth daily.     Rhodiola 300 MG CAPS Take 1 capsule by mouth daily.       sucralfate (CARAFATE) 1 GM/10ML suspension Take 10 mLs (1 g total) by mouth every 8 (eight) hours as needed. 420 mL 1   triamcinolone (NASACORT) 55 MCG/ACT AERO nasal inhaler      vitamin C (ASCORBIC ACID) 500 MG tablet Take 1,500 mg by mouth daily.     vitamin E 180 MG (400 UNITS) capsule Take 400 Units by mouth every other day.     No current facility-administered medications for this visit.    Allergies  Allergen Reactions   Benzonatate     Unknown, patient states "it made me sick"   Chlorpromazine Hcl     unknown   Codeine     REACTION: causes vomiting   Doxycycline     "explosive diarrhea" -does not wish to take this anymore   Hydrocodone     REACTION: causes vomiing and nausea   Hydrocodone-Acetaminophen  Nausea And Vomiting   Lisinopril Cough    Patient states that she will no longer take this medication because it gave her a bad cough    Lovaza [Omega-3-Acid Ethyl Esters] Other (See Comments)    Loose stools with two capsules- can take 1 capsule with no issues  Penicillins     REACTION: causes rash   Prochlorperazine Edisylate     unknown    Promethazine Hcl     unknown   Sulfonamide Derivatives     unknown     Past Medical History:  Diagnosis Date   Allergy    Anxiety    no anxiety per pt- she wants removed    CAD (coronary artery disease)    Cataract    forming    Chronic insomnia    Colon polyp    Depression    GERD (gastroesophageal reflux disease)    GERD with esophagitis    Heart attack ([place]) 06/2007   Heart murmur    Hyperlipemia    Hypertension    Osteopenia    Perirectal abscess    Shingles 08/31/2017   2nd dx with shingles, per pt- shingles x 4 - severe rxn to shingles vax - last shingles 05-2021    There were no vitals taken for this visit.  No problem-specific Assessment & Plan notes found for this encounter.    Tommy Medal PharmD CPP [facility] [address] [place], [place] [address] [phone]

## 2021-11-24 NOTE — Patient Instructions (Signed)
Return for a a follow up appointment in July  Check your blood pressure at home twice daily and keep record of the readings.  Take your BP meds as follows:  Increase candesartan to 16 mg once daily  Continue all other medications  Bring all of your meds, your BP cuff and your record of home blood pressures to your next appointment.  Exercise as you're able, try to walk approximately 30 minutes per day.  Keep salt intake to a minimum, especially watch canned and prepared boxed foods.  Eat more fresh fruits and vegetables and fewer canned items.  Avoid eating in fast food restaurants.    HOW TO TAKE YOUR BLOOD PRESSURE: Rest 5 minutes before taking your blood pressure.  Don't smoke or drink caffeinated beverages for at least 30 minutes before. Take your blood pressure before (not after) you eat. Sit comfortably with your back supported and both feet on the floor (don't cross your legs). Elevate your arm to heart level on a table or a desk. Use the proper sized cuff. It should fit smoothly and snugly around your bare upper arm. There should be enough room to slip a fingertip under the cuff. The bottom edge of the cuff should be 1 inch above the crease of the elbow. Ideally, take 3 measurements at one sitting and record the average.

## 2021-11-26 NOTE — Assessment & Plan Note (Signed)
Patient with essential hypertension and hesitancy to increase/add medications.  Discussed the importance of keeping BP well controlled to prevent increase risk in CAD/CHF.  She is agreeable to increasing candesartan to 16 mg daily and continuing with metoprolol.  If her pressure continues to be elevated in the future can consider switching metoprolol to carvedilol for better results.  Will see her back in 4-6 weeks for follow up.

## 2021-11-29 DIAGNOSIS — Z1211 Encounter for screening for malignant neoplasm of colon: Secondary | ICD-10-CM | POA: Diagnosis not present

## 2021-11-29 DIAGNOSIS — Z01411 Encounter for gynecological examination (general) (routine) with abnormal findings: Secondary | ICD-10-CM | POA: Diagnosis not present

## 2021-11-29 DIAGNOSIS — L900 Lichen sclerosus et atrophicus: Secondary | ICD-10-CM | POA: Diagnosis not present

## 2021-11-29 DIAGNOSIS — M8580 Other specified disorders of bone density and structure, unspecified site: Secondary | ICD-10-CM | POA: Diagnosis not present

## 2021-11-29 DIAGNOSIS — Z1239 Encounter for other screening for malignant neoplasm of breast: Secondary | ICD-10-CM | POA: Diagnosis not present

## 2021-11-29 DIAGNOSIS — Z6830 Body mass index (BMI) 30.0-30.9, adult: Secondary | ICD-10-CM | POA: Diagnosis not present

## 2021-12-19 DIAGNOSIS — N390 Urinary tract infection, site not specified: Secondary | ICD-10-CM | POA: Diagnosis not present

## 2021-12-27 DIAGNOSIS — N870 Mild cervical dysplasia: Secondary | ICD-10-CM | POA: Diagnosis not present

## 2021-12-27 DIAGNOSIS — L900 Lichen sclerosus et atrophicus: Secondary | ICD-10-CM | POA: Diagnosis not present

## 2022-01-01 DIAGNOSIS — N939 Abnormal uterine and vaginal bleeding, unspecified: Secondary | ICD-10-CM | POA: Diagnosis not present

## 2022-01-02 DIAGNOSIS — T819XXA Unspecified complication of procedure, initial encounter: Secondary | ICD-10-CM | POA: Diagnosis not present

## 2022-01-02 DIAGNOSIS — N99820 Postprocedural hemorrhage and hematoma of a genitourinary system organ or structure following a genitourinary system procedure: Secondary | ICD-10-CM | POA: Diagnosis not present

## 2022-01-03 ENCOUNTER — Ambulatory Visit: Payer: Medicare HMO

## 2022-01-05 DIAGNOSIS — Z880 Allergy status to penicillin: Secondary | ICD-10-CM | POA: Diagnosis not present

## 2022-01-05 DIAGNOSIS — Z887 Allergy status to serum and vaccine status: Secondary | ICD-10-CM | POA: Diagnosis not present

## 2022-01-05 DIAGNOSIS — Z881 Allergy status to other antibiotic agents status: Secondary | ICD-10-CM | POA: Diagnosis not present

## 2022-01-05 DIAGNOSIS — Z888 Allergy status to other drugs, medicaments and biological substances status: Secondary | ICD-10-CM | POA: Diagnosis not present

## 2022-01-05 DIAGNOSIS — Z461 Encounter for fitting and adjustment of hearing aid: Secondary | ICD-10-CM | POA: Diagnosis not present

## 2022-01-05 DIAGNOSIS — H608X3 Other otitis externa, bilateral: Secondary | ICD-10-CM | POA: Diagnosis not present

## 2022-01-05 DIAGNOSIS — H903 Sensorineural hearing loss, bilateral: Secondary | ICD-10-CM | POA: Diagnosis not present

## 2022-01-05 DIAGNOSIS — Z882 Allergy status to sulfonamides status: Secondary | ICD-10-CM | POA: Diagnosis not present

## 2022-01-05 DIAGNOSIS — Z885 Allergy status to narcotic agent status: Secondary | ICD-10-CM | POA: Diagnosis not present

## 2022-01-05 DIAGNOSIS — Z886 Allergy status to analgesic agent status: Secondary | ICD-10-CM | POA: Diagnosis not present

## 2022-01-05 DIAGNOSIS — H6123 Impacted cerumen, bilateral: Secondary | ICD-10-CM | POA: Diagnosis not present

## 2022-01-10 DIAGNOSIS — L308 Other specified dermatitis: Secondary | ICD-10-CM | POA: Diagnosis not present

## 2022-01-17 ENCOUNTER — Ambulatory Visit: Payer: Medicare HMO

## 2022-01-30 ENCOUNTER — Ambulatory Visit: Payer: Medicare HMO | Admitting: Pharmacist Clinician (PhC)/ Clinical Pharmacy Specialist

## 2022-01-30 ENCOUNTER — Encounter: Payer: Self-pay | Admitting: Pharmacist Clinician (PhC)/ Clinical Pharmacy Specialist

## 2022-01-30 DIAGNOSIS — I10 Essential (primary) hypertension: Secondary | ICD-10-CM

## 2022-01-30 NOTE — Progress Notes (Unsigned)
02/08/2022 Emily [name] Apr 04, 1951 [id number]   HPI:  Emily [name] is a 71 y.o. female patient of Dr Gwenlyn Found, with a PMH below who presents today for hypertension clinic evaluation.  Back in 2021 she was referred to the advanced hypertension clinic for labile blood pressure readings, and it was noted that stress affected her readings.  In doing secondary workup, renal dopplers showed 1-59% blockage of right renal artery and > 60% in left.  A follow up CT suggested beaded configuration compatible with fibromuscular dysplasia with 50-60% left blockage.  She has been on metoprolol succ and candesartan for a few years now and tolerates both without issue.  Per patient her pressures were well controlled until she had her shingles vaccines, and have been hard to manage since.   She is in the office today with her husband.  She feels that her BP was good for the last few weeks, then increased about May 1.  When her pressure goes up, she starts checking every few hours, had 10-11 checks daily for three days.  At her visit last month stressed need to check BP only twice daily, regardless of reading, and divided metoprolol and candesartan to different times of the day.  Over the course of several visits we were able to get her to stop checking BP so many times each day.    She returns today for follow up.  She notes some elevated readings after having a GYN procedure that led to a nicked artery and the need for stitches.  She is also trying to work her sleep schedule back into a more normal routine - currently she sleeps from around 4 am to 10-11 am.  Would like to shift it back to 1-2 am, as it is interfering with some activities and appointments.   Past Medical History: Carotid stenosis 9/21 1-39% stenosis bilaterally  hyperlipidemia 4/23 LDL 79 on atorvastatin 80  DM2 6/22 A1c 6.3, down from 6.7 (2017)  Fibromuscular dysplasia With 60% stenosis in Left renal artery    Blood Pressure Goal:   130/80  Current Medications: candesartan 8 mg qd pm;  metoprolol succ 50 mg qd am  Family Hx: father died at 59 (last year) ckd, GI bowel problems, mom at 33 from brain hemorrhage, had enlarged heart 2/2 hypertension (only weighed ~100 lb); 2 children in 41's healthy  Social Hx: no tobacco, quit in 08; occasional alcohol - maybe once a month at most; no coffee, 6 oz coke in the am; otherwise water.  Hot tea if feeling sick  Diet: mix of home and restaurant; does use some salt (light); salad and meat most nights; peanut butter crackers;  More carbs Fri-Sun  Exercise: no regular exercise, hoping to join silver sneakers soon - will start once Gyn cleared   Home BP readings: now down to checking only 1-2 times per day and even missing some days  Last 30 readings average 118/68 with range 101-134/61-75  Intolerances: ACEI - cough, omega 3 oils - diarrhea in larger doses  Labs: 4/23: Na 137, K 3.7, Glu 137, BUN 12, SCr 0.54, GFR > 60   Wt Readings from Last 3 Encounters:  11/24/21 158 lb (71.7 kg)  11/22/21 156 lb 11.2 oz (71.1 kg)  10/21/21 157 lb 6.4 oz (71.4 kg)   BP Readings from Last 3 Encounters:  01/30/22 125/77  11/24/21 124/67  11/22/21 120/70   Pulse Readings from Last 3 Encounters:  01/30/22 (!) 57  11/24/21 (!) 58  11/22/21 61    Current Outpatient Medications  Medication Sig Dispense Refill   Acetylcarnitine HCl (ACETYL L-CARNITINE) 250 MG CAPS Take by mouth. 200 mg every other day     ALPRAZolam (XANAX) 0.25 MG tablet Take 1 tablet (0.25 mg total) by mouth 2 (two) times daily as needed for anxiety. 30 tablet 1   atorvastatin (LIPITOR) 80 MG tablet TAKE 1 TABLET BY MOUTH EVERY DAY 90 tablet 3   b complex vitamins tablet Take 1 tablet by mouth daily.     Biotin 300 MCG TABS Take 600 mcg by mouth every other day.     Calcium-Magnesium-Zinc 500-250-12.5 MG TABS Take 1 tablet by mouth every other day.     candesartan (ATACAND) 16 MG tablet TAKE 1 TABLET BY MOUTH EVERY  DAY 90 tablet 0   Cholecalciferol (VITAMIN D) 50 MCG (2000 UT) CAPS Take 2,000 Units by mouth daily.     Chromium Picolinate 200 MCG TABS Take 1 tablet by mouth every other day.     clobetasol ointment (TEMOVATE) 0.05 % as needed.     clopidogrel (PLAVIX) 75 MG tablet Take 1 tablet (75 mg total) by mouth daily. 90 tablet 3   Coenzyme Q10 (COQ10) 200 MG CAPS Take 1 capsule by mouth daily.     CRANBERRY JUICE EXTRACT PO Take 500 mg by mouth daily.     EPINEPHrine 0.3 mg/0.3 mL IJ SOAJ injection INJECT 0.3 MLS INTO THE MUSCLE ONCE FOR 1 DOSE  0   famotidine (PEPCID AC) 10 MG tablet Take 1 tablet (10 mg total) by mouth 2 (two) times daily. Take  1-2 tablets twice daily 180 tablet 3   finasteride (PROSCAR) 5 MG tablet Take 5 mg by mouth daily.     Flaxseed, Linseed, (FLAX SEED OIL) 1000 MG CAPS Take 1,200 mg by mouth daily.     Garlic Oil 102 MG TABS Take 1 tablet by mouth daily.     metoprolol succinate (TOPROL-XL) 50 MG 24 hr tablet Take 1 tablet (50 mg total) by mouth daily. 90 tablet 3   nitroGLYCERIN (NITROSTAT) 0.4 MG SL tablet Place 1 tablet (0.4 mg total) under the tongue every 5 (five) minutes x 3 doses as needed for chest pain. 25 tablet 3   omega-3 acid ethyl esters (LOVAZA) 1 g capsule TAKE 1 CAPSULE BY MOUTH EVERY DAY 90 capsule 1   omeprazole (PRILOSEC) 20 MG capsule Take 1 capsule (20 mg total) by mouth daily as needed. 30 capsule 3   OVER THE COUNTER MEDICATION Take 1,000 mg by mouth in the morning and at bedtime. Beetroot 1000 mg capsules     Probiotic Product (ALIGN PO) Take 1 capsule by mouth daily.     Rhodiola 300 MG CAPS Take 1 capsule by mouth daily.       sucralfate (CARAFATE) 1 GM/10ML suspension Take 10 mLs (1 g total) by mouth every 8 (eight) hours as needed. 420 mL 1   triamcinolone (NASACORT) 55 MCG/ACT AERO nasal inhaler      vitamin C (ASCORBIC ACID) 500 MG tablet Take 1,500 mg by mouth daily.     vitamin E 180 MG (400 UNITS) capsule Take 400 Units by mouth every other  day.     No current facility-administered medications for this visit.    Allergies  Allergen Reactions   Benzonatate     Unknown, patient states "it made me sick"   Chlorpromazine Hcl     unknown   Codeine     REACTION:  causes vomiting   Doxycycline     "explosive diarrhea" -does not wish to take this anymore   Hydrocodone     REACTION: causes vomiing and nausea   Hydrocodone-Acetaminophen Nausea And Vomiting   Lisinopril Cough    Patient states that she will no longer take this medication because it gave her a bad cough    Lovaza [Omega-3-Acid Ethyl Esters] Other (See Comments)    Loose stools with two capsules- can take 1 capsule with no issues    Penicillins     REACTION: causes rash   Prochlorperazine Edisylate     unknown    Promethazine Hcl     unknown   Sulfonamide Derivatives     unknown     Past Medical History:  Diagnosis Date   Allergy    Anxiety    no anxiety per pt- she wants removed    CAD (coronary artery disease)    Cataract    forming    Chronic insomnia    Colon polyp    Depression    GERD (gastroesophageal reflux disease)    GERD with esophagitis    Heart attack ([place]) 06/2007   Heart murmur    Hyperlipemia    Hypertension    Osteopenia    Perirectal abscess    Shingles 08/31/2017   2nd dx with shingles, per pt- shingles x 4 - severe rxn to shingles vax - last shingles 05-2021    Blood pressure 125/77, pulse (!) 57.  Essential hypertension Patient with essential hypertension, now stable on candesartan and metoprolol.  Has gotten much better at checking home BP readings no more than twice daily.  Continue with all current medications and monitor just a few times each week.  Patient aware to reach out to the office with any concerns.    Tommy Medal PharmD CPP [facility] [address] [place], [place] [address] [phone]

## 2022-01-30 NOTE — Patient Instructions (Signed)
Return for a a follow up appointment with Dr. Aundra Dubin later this fall  Check your blood pressure at home daily - 3-4 times each week and keep record of the readings.  Take your BP meds as follows:  Continue with your current medications  Bring all of your meds, your BP cuff and your record of home blood pressures to your next appointment.  Exercise as you're able, try to walk approximately 30 minutes per day.  Keep salt intake to a minimum, especially watch canned and prepared boxed foods.  Eat more fresh fruits and vegetables and fewer canned items.  Avoid eating in fast food restaurants.    HOW TO TAKE YOUR BLOOD PRESSURE: Rest 5 minutes before taking your blood pressure.  Don't smoke or drink caffeinated beverages for at least 30 minutes before. Take your blood pressure before (not after) you eat. Sit comfortably with your back supported and both feet on the floor (don't cross your legs). Elevate your arm to heart level on a table or a desk. Use the proper sized cuff. It should fit smoothly and snugly around your bare upper arm. There should be enough room to slip a fingertip under the cuff. The bottom edge of the cuff should be 1 inch above the crease of the elbow. Ideally, take 3 measurements at one sitting and record the average.

## 2022-01-31 DIAGNOSIS — L900 Lichen sclerosus et atrophicus: Secondary | ICD-10-CM | POA: Diagnosis not present

## 2022-02-02 ENCOUNTER — Other Ambulatory Visit: Payer: Self-pay | Admitting: Cardiovascular Disease

## 2022-02-07 ENCOUNTER — Other Ambulatory Visit: Payer: Self-pay | Admitting: Cardiovascular Disease

## 2022-02-08 NOTE — Assessment & Plan Note (Signed)
Patient with essential hypertension, now stable on candesartan and metoprolol.  Has gotten much better at checking home BP readings no more than twice daily.  Continue with all current medications and monitor just a few times each week.  Patient aware to reach out to the office with any concerns.

## 2022-02-10 ENCOUNTER — Other Ambulatory Visit: Payer: Self-pay | Admitting: Internal Medicine

## 2022-02-10 DIAGNOSIS — F341 Dysthymic disorder: Secondary | ICD-10-CM

## 2022-02-22 ENCOUNTER — Other Ambulatory Visit (HOSPITAL_COMMUNITY): Payer: Self-pay | Admitting: Cardiology

## 2022-02-23 ENCOUNTER — Encounter: Payer: Self-pay | Admitting: Internal Medicine

## 2022-02-23 ENCOUNTER — Ambulatory Visit (INDEPENDENT_AMBULATORY_CARE_PROVIDER_SITE_OTHER): Payer: Medicare HMO | Admitting: Internal Medicine

## 2022-02-23 VITALS — BP 146/78 | HR 67 | Temp 97.9°F | Resp 16 | Wt 152.0 lb

## 2022-02-23 DIAGNOSIS — R7303 Prediabetes: Secondary | ICD-10-CM | POA: Diagnosis not present

## 2022-02-23 DIAGNOSIS — F411 Generalized anxiety disorder: Secondary | ICD-10-CM | POA: Diagnosis not present

## 2022-02-23 DIAGNOSIS — I10 Essential (primary) hypertension: Secondary | ICD-10-CM

## 2022-02-23 DIAGNOSIS — E785 Hyperlipidemia, unspecified: Secondary | ICD-10-CM | POA: Diagnosis not present

## 2022-02-23 DIAGNOSIS — R69 Illness, unspecified: Secondary | ICD-10-CM | POA: Diagnosis not present

## 2022-02-23 DIAGNOSIS — Z0001 Encounter for general adult medical examination with abnormal findings: Secondary | ICD-10-CM

## 2022-02-23 LAB — BASIC METABOLIC PANEL
BUN: 15 mg/dL (ref 6–23)
CO2: 27 mEq/L (ref 19–32)
Calcium: 9.3 mg/dL (ref 8.4–10.5)
Chloride: 103 mEq/L (ref 96–112)
Creatinine, Ser: 0.78 mg/dL (ref 0.40–1.20)
GFR: 76.74 mL/min (ref >60.00)
Glucose, Bld: 133 mg/dL — ABNORMAL HIGH (ref 70–99)
Potassium: 3.6 mEq/L (ref 3.5–5.1)
Sodium: 140 mEq/L (ref 135–145)

## 2022-02-23 LAB — HEPATIC FUNCTION PANEL
ALT: 19 U/L (ref 0–35)
AST: 21 U/L (ref 0–37)
Albumin: 4.2 g/dL (ref 3.5–5.2)
Alkaline Phosphatase: 86 U/L (ref 39–117)
Bilirubin, Direct: 0.1 mg/dL (ref 0.0–0.3)
Total Bilirubin: 0.7 mg/dL (ref 0.2–1.2)
Total Protein: 6.9 g/dL (ref 6.0–8.3)

## 2022-02-23 LAB — HEMOGLOBIN A1C: Hgb A1c MFr Bld: 6.4 % (ref 4.6–6.5)

## 2022-02-23 LAB — TSH: TSH: 3.88 u[IU]/mL (ref 0.35–5.50)

## 2022-02-23 MED ORDER — ALPRAZOLAM 0.25 MG PO TABS: 0.2500 mg | ORAL_TABLET | Freq: Two times a day (BID) | ORAL | 1 refills | Status: AC | PRN

## 2022-02-23 NOTE — Progress Notes (Signed)
Subjective:  Patient ID: Emily [name], female    DOB: [birth year]  Age: 71 y.o. MRN: [id number]  CC: Annual Exam and Hypertension   HPI Emily [name] presents for a CPX and f/up -   She is aware that her blood pressure is not adequately well controlled.  She recently saw someone else and it was recommended that she increase the dose of candesartan to 16 mg but she has elected not to do that.  She continues to complain of anxiety and requests a refill of Xanax.  Outpatient Medications Prior to Visit  Medication Sig Dispense Refill   Acetylcarnitine HCl (ACETYL L-CARNITINE) 250 MG CAPS Take by mouth. 200 mg every other day     atorvastatin (LIPITOR) 80 MG tablet TAKE 1 TABLET BY MOUTH EVERY DAY 90 tablet 3   b complex vitamins tablet Take 1 tablet by mouth daily.     Biotin 300 MCG TABS Take 600 mcg by mouth every other day.     Calcium-Magnesium-Zinc 500-250-12.5 MG TABS Take 1 tablet by mouth every other day.     candesartan (ATACAND) 16 MG tablet TAKE 1 TABLET BY MOUTH EVERY DAY 90 tablet 0   Cholecalciferol (VITAMIN D) 50 MCG (2000 UT) CAPS Take 2,000 Units by mouth daily.     Chromium Picolinate 200 MCG TABS Take 1 tablet by mouth every other day.     clobetasol ointment (TEMOVATE) 0.05 % as needed.     clopidogrel (PLAVIX) 75 MG tablet Take 1 tablet (75 mg total) by mouth daily. 90 tablet 3   Coenzyme Q10 (COQ10) 200 MG CAPS Take 1 capsule by mouth daily.     CRANBERRY JUICE EXTRACT PO Take 500 mg by mouth daily.     EPINEPHrine 0.3 mg/0.3 mL IJ SOAJ injection INJECT 0.3 MLS INTO THE MUSCLE ONCE FOR 1 DOSE  0   famotidine (PEPCID AC) 10 MG tablet Take 1 tablet (10 mg total) by mouth 2 (two) times daily. Take  1-2 tablets twice daily 180 tablet 3   finasteride (PROSCAR) 5 MG tablet Take 5 mg by mouth daily.     Flaxseed, Linseed, (FLAX SEED OIL) 1000 MG CAPS Take 1,200 mg by mouth daily.     Garlic Oil 174 MG TABS Take 1 tablet by mouth daily.     metoprolol succinate  (TOPROL-XL) 50 MG 24 hr tablet Take 1 tablet (50 mg total) by mouth daily. 90 tablet 3   nitroGLYCERIN (NITROSTAT) 0.4 MG SL tablet Place 1 tablet (0.4 mg total) under the tongue every 5 (five) minutes x 3 doses as needed for chest pain. 25 tablet 3   omega-3 acid ethyl esters (LOVAZA) 1 g capsule TAKE 1 CAPSULE BY MOUTH EVERY DAY 90 capsule 1   omeprazole (PRILOSEC) 20 MG capsule Take 1 capsule (20 mg total) by mouth daily as needed. 30 capsule 3   OVER THE COUNTER MEDICATION Take 1,000 mg by mouth in the morning and at bedtime. Beetroot 1000 mg capsules     Probiotic Product (ALIGN PO) Take 1 capsule by mouth daily.     Rhodiola 300 MG CAPS Take 1 capsule by mouth daily.       sucralfate (CARAFATE) 1 GM/10ML suspension Take 10 mLs (1 g total) by mouth every 8 (eight) hours as needed. 420 mL 1   triamcinolone (NASACORT) 55 MCG/ACT AERO nasal inhaler      vitamin C (ASCORBIC ACID) 500 MG tablet Take 1,500 mg by mouth daily.     vitamin  E 180 MG (400 UNITS) capsule Take 400 Units by mouth every other day.     ALPRAZolam (XANAX) 0.25 MG tablet Take 1 tablet (0.25 mg total) by mouth 2 (two) times daily as needed for anxiety. 30 tablet 1   No facility-administered medications prior to visit.    ROS Review of Systems  Constitutional: Negative.  Negative for diaphoresis and fatigue.  HENT: Negative.    Eyes: Negative.   Respiratory:  Negative for cough, chest tightness, shortness of breath and wheezing.   Cardiovascular:  Negative for chest pain, palpitations and leg swelling.  Gastrointestinal:  Negative for abdominal pain, diarrhea, nausea and vomiting.  Endocrine: Negative.   Genitourinary: Negative.  Negative for difficulty urinating.  Musculoskeletal:  Negative for arthralgias, joint swelling and myalgias.  Neurological:  Negative for dizziness, weakness, light-headedness, numbness and headaches.  Hematological:  Negative for adenopathy. Does not bruise/bleed easily.   Psychiatric/Behavioral:  Negative for decreased concentration, dysphoric mood and sleep disturbance. The patient is nervous/anxious.     Objective:  BP (!) 146/78 (BP Location: Left Arm, Patient Position: Sitting, Cuff Size: Normal)   Pulse 67   Temp 97.9 F (36.6 C) (Oral)   Resp 16   Wt 152 lb (68.9 kg)   SpO2 97%   BMI 28.72 kg/m   BP Readings from Last 3 Encounters:  02/23/22 (!) 146/78  01/30/22 125/77  11/24/21 124/67    Wt Readings from Last 3 Encounters:  02/23/22 152 lb (68.9 kg)  11/24/21 158 lb (71.7 kg)  11/22/21 156 lb 11.2 oz (71.1 kg)    Physical Exam Vitals reviewed.  HENT:     Nose: Nose normal.     Mouth/Throat:     Mouth: Mucous membranes are moist.  Eyes:     General: No scleral icterus.    Conjunctiva/sclera: Conjunctivae normal.  Cardiovascular:     Rate and Rhythm: Normal rate and regular rhythm.     Heart sounds: Murmur heard.     Systolic murmur is present with a grade of 1/6.     No diastolic murmur is present.     No friction rub. No gallop.  Pulmonary:     Effort: Pulmonary effort is normal.     Breath sounds: No stridor. No wheezing, rhonchi or rales.  Abdominal:     General: Abdomen is flat.     Palpations: There is no mass.     Tenderness: There is no abdominal tenderness. There is no guarding.     Hernia: No hernia is present.  Musculoskeletal:        General: Normal range of motion.     Cervical back: Neck supple.     Right lower leg: No edema.     Left lower leg: No edema.  Skin:    General: Skin is warm and dry.  Neurological:     General: No focal deficit present.     Mental Status: She is alert.  Psychiatric:        Mood and Affect: Mood normal.        Behavior: Behavior normal.        Thought Content: Thought content normal.        Judgment: Judgment normal.     Lab Results  Component Value Date   WBC 6.7 09/22/2021   HGB 14.2 09/22/2021   HCT 42.7 09/22/2021   PLT 250 09/22/2021   GLUCOSE 133 (H)  02/23/2022   CHOL 169 09/22/2021   TRIG 198 (H) 09/22/2021  HDL 51 09/22/2021   LDLDIRECT 104.0 01/17/2018   LDLCALC 78 09/22/2021   ALT 19 02/23/2022   AST 21 02/23/2022   NA 140 02/23/2022   K 3.6 02/23/2022   CL 103 02/23/2022   CREATININE 0.78 02/23/2022   BUN 15 02/23/2022   CO2 27 02/23/2022   TSH 3.88 02/23/2022   INR 0.9 07/18/2007   HGBA1C 6.4 02/23/2022    No results found.  Assessment & Plan:   Ellene was seen today for annual exam and hypertension.  Diagnoses and all orders for this visit:  Essential hypertension- Her blood pressure is not adequately well controlled. -     Basic metabolic panel; Future -     TSH; Future -     Hepatic function panel; Future -     Hepatic function panel -     TSH -     Basic metabolic panel  Prediabetes- Her A1c is at 6.4%.  Medical therapy is not yet indicated. -     Basic metabolic panel; Future -     Hemoglobin A1c; Future -     Hemoglobin A1c -     Basic metabolic panel  Encounter for general adult medical examination with abnormal findings- Exam completed, labs reviewed, vaccines reviewed-she refused all vaccines today, cancer screenings are up-to-date, patient education was given.  Hyperlipidemia with target LDL less than 130- LDL goal achieved. Doing well on the statin  -     Hepatic function panel; Future -     Hepatic function panel  GAD (generalized anxiety disorder) -     ALPRAZolam (XANAX) 0.25 MG tablet; Take 1 tablet (0.25 mg total) by mouth 2 (two) times daily as needed for anxiety.   I am having Emily [name] "Emily [name]" maintain her Acetyl L-Carnitine, Biotin, Calcium-Magnesium-Zinc, Chromium Picolinate, CoQ10, CRANBERRY JUICE EXTRACT PO, Garlic Oil, Rhodiola, Flax Seed Oil, b complex vitamins, Probiotic Product (ALIGN PO), EPINEPHrine, famotidine, finasteride, triamcinolone, clobetasol ointment, omeprazole, sucralfate, nitroGLYCERIN, atorvastatin, clopidogrel, metoprolol succinate, ascorbic acid, OVER  THE COUNTER MEDICATION, Vitamin D, vitamin E, candesartan, omega-3 acid ethyl esters, and ALPRAZolam.  Meds ordered this encounter  Medications   ALPRAZolam (XANAX) 0.25 MG tablet    Sig: Take 1 tablet (0.25 mg total) by mouth 2 (two) times daily as needed for anxiety.    Dispense:  180 tablet    Refill:  1     Follow-up: Return in about 6 months (around 08/24/2022).  Scarlette Calico, MD

## 2022-02-23 NOTE — Patient Instructions (Signed)

## 2022-02-24 ENCOUNTER — Telehealth: Payer: Self-pay | Admitting: Pharmacist Clinician (PhC)/ Clinical Pharmacy Specialist

## 2022-02-24 NOTE — Telephone Encounter (Signed)
Patient requesting to speak with Erasmo Downer on the phone. She says it is not an emergency and wants to discuss her recent appointment with Dr. Ronnald Ramp. She says she can be reached from 2:00 pm to 4:00 pm today.

## 2022-02-25 NOTE — Telephone Encounter (Signed)
Spoke with patient and answered questions.

## 2022-02-28 ENCOUNTER — Telehealth: Payer: Self-pay

## 2022-02-28 ENCOUNTER — Other Ambulatory Visit: Payer: Self-pay | Admitting: Internal Medicine

## 2022-02-28 DIAGNOSIS — L639 Alopecia areata, unspecified: Secondary | ICD-10-CM

## 2022-02-28 NOTE — Telephone Encounter (Signed)
Pt is requesting a new referral to see a new hair loss specialist. Pt is not satisfied with the current one that she has been seeing states that she doesn't even examine her hair.  Pt is requesting to see Daneil Dan A. Irish Elders, MD Phone: [phone]  Address: [address] [place], [place],  [address]  Please advise

## 2022-03-03 DIAGNOSIS — S60551A Superficial foreign body of right hand, initial encounter: Secondary | ICD-10-CM | POA: Diagnosis not present

## 2022-03-07 ENCOUNTER — Telehealth: Payer: Self-pay | Admitting: Cardiovascular Disease

## 2022-03-07 NOTE — Telephone Encounter (Signed)
Pt asked to speak with Erasmo Downer RPH-CPP about a letter she sent

## 2022-03-10 DIAGNOSIS — R300 Dysuria: Secondary | ICD-10-CM | POA: Diagnosis not present

## 2022-03-22 ENCOUNTER — Ambulatory Visit: Payer: Medicare HMO

## 2022-03-22 NOTE — Progress Notes (Deleted)
03/22/2022 Emily [name] 1951/02/16 [id number]   HPI:  Emily [name] is a 71 y.o. female patient of Dr Gwenlyn Found, with a PMH below who presents today for hypertension clinic evaluation.  Back in 2021 she was referred to the advanced hypertension clinic for labile blood pressure readings, and it was noted that stress affected her readings.  In doing secondary workup, renal dopplers showed 1-59% blockage of right renal artery and > 60% in left.  A follow up CT suggested beaded configuration compatible with fibromuscular dysplasia with 50-60% left blockage.  She has been on metoprolol succ and candesartan for a few years now and tolerates both without issue.  Per patient her pressures were well controlled until she had her shingles vaccines, and have been hard to manage since.   She is in the office today with her husband.  She feels that her BP was good for the last few weeks, then increased about May 1.  When her pressure goes up, she starts checking every few hours, had 10-11 checks daily for three days.  At her visit last month stressed need to check BP only twice daily, regardless of reading, and divided metoprolol and candesartan to different times of the day.  Over the course of several visits we were able to get her to stop checking BP so many times each day.    She returns today for follow up.  She notes some elevated readings after having a GYN procedure that led to a nicked artery and the need for stitches.  She is also trying to work her sleep schedule back into a more normal routine - currently she sleeps from around 4 am to 10-11 am.  Would like to shift it back to 1-2 am, as it is interfering with some activities and appointments.   Past Medical History: Carotid stenosis 9/21 1-39% stenosis bilaterally  hyperlipidemia 4/23 LDL 79 on atorvastatin 80  DM2 6/22 A1c 6.3, down from 6.7 (2017)  Fibromuscular dysplasia With 60% stenosis in Left renal artery    Blood Pressure Goal:   130/80  Current Medications: candesartan 8 mg qd pm;  metoprolol succ 50 mg qd am  Family Hx: father died at 75 (last year) ckd, GI bowel problems, mom at 42 from brain hemorrhage, had enlarged heart 2/2 hypertension (only weighed ~100 lb); 2 children in 29's healthy  Social Hx: no tobacco, quit in 08; occasional alcohol - maybe once a month at most; no coffee, 6 oz coke in the am; otherwise water.  Hot tea if feeling sick  Diet: mix of home and restaurant; does use some salt (light); salad and meat most nights; peanut butter crackers;  More carbs Fri-Sun  Exercise: no regular exercise, hoping to join silver sneakers soon - will start once Gyn cleared   Home BP readings: now down to checking only 1-2 times per day and even missing some days  Last 30 readings average 118/68 with range 101-134/61-75  Intolerances: ACEI - cough, omega 3 oils - diarrhea in larger doses  Labs: 4/23: Na 137, K 3.7, Glu 137, BUN 12, SCr 0.54, GFR > 60   Wt Readings from Last 3 Encounters:  02/23/22 152 lb (68.9 kg)  11/24/21 158 lb (71.7 kg)  11/22/21 156 lb 11.2 oz (71.1 kg)   BP Readings from Last 3 Encounters:  02/23/22 (!) 146/78  01/30/22 125/77  11/24/21 124/67   Pulse Readings from Last 3 Encounters:  02/23/22 67  01/30/22 (!) 57  11/24/21 (!)  58    Current Outpatient Medications  Medication Sig Dispense Refill   Acetylcarnitine HCl (ACETYL L-CARNITINE) 250 MG CAPS Take by mouth. 200 mg every other day     ALPRAZolam (XANAX) 0.25 MG tablet Take 1 tablet (0.25 mg total) by mouth 2 (two) times daily as needed for anxiety. 180 tablet 1   atorvastatin (LIPITOR) 80 MG tablet TAKE 1 TABLET BY MOUTH EVERY DAY 90 tablet 3   b complex vitamins tablet Take 1 tablet by mouth daily.     Biotin 300 MCG TABS Take 600 mcg by mouth every other day.     Calcium-Magnesium-Zinc 500-250-12.5 MG TABS Take 1 tablet by mouth every other day.     candesartan (ATACAND) 16 MG tablet TAKE 1 TABLET BY MOUTH EVERY  DAY 90 tablet 0   Cholecalciferol (VITAMIN D) 50 MCG (2000 UT) CAPS Take 2,000 Units by mouth daily.     Chromium Picolinate 200 MCG TABS Take 1 tablet by mouth every other day.     clobetasol ointment (TEMOVATE) 0.05 % as needed.     clopidogrel (PLAVIX) 75 MG tablet Take 1 tablet (75 mg total) by mouth daily. 90 tablet 3   Coenzyme Q10 (COQ10) 200 MG CAPS Take 1 capsule by mouth daily.     CRANBERRY JUICE EXTRACT PO Take 500 mg by mouth daily.     EPINEPHrine 0.3 mg/0.3 mL IJ SOAJ injection INJECT 0.3 MLS INTO THE MUSCLE ONCE FOR 1 DOSE  0   famotidine (PEPCID AC) 10 MG tablet Take 1 tablet (10 mg total) by mouth 2 (two) times daily. Take  1-2 tablets twice daily 180 tablet 3   finasteride (PROSCAR) 5 MG tablet Take 5 mg by mouth daily.     Flaxseed, Linseed, (FLAX SEED OIL) 1000 MG CAPS Take 1,200 mg by mouth daily.     Garlic Oil 160 MG TABS Take 1 tablet by mouth daily.     metoprolol succinate (TOPROL-XL) 50 MG 24 hr tablet Take 1 tablet (50 mg total) by mouth daily. 90 tablet 3   nitroGLYCERIN (NITROSTAT) 0.4 MG SL tablet Place 1 tablet (0.4 mg total) under the tongue every 5 (five) minutes x 3 doses as needed for chest pain. 25 tablet 3   omega-3 acid ethyl esters (LOVAZA) 1 g capsule TAKE 1 CAPSULE BY MOUTH EVERY DAY 90 capsule 1   omeprazole (PRILOSEC) 20 MG capsule Take 1 capsule (20 mg total) by mouth daily as needed. 30 capsule 3   OVER THE COUNTER MEDICATION Take 1,000 mg by mouth in the morning and at bedtime. Beetroot 1000 mg capsules     Probiotic Product (ALIGN PO) Take 1 capsule by mouth daily.     Rhodiola 300 MG CAPS Take 1 capsule by mouth daily.       sucralfate (CARAFATE) 1 GM/10ML suspension Take 10 mLs (1 g total) by mouth every 8 (eight) hours as needed. 420 mL 1   triamcinolone (NASACORT) 55 MCG/ACT AERO nasal inhaler      vitamin C (ASCORBIC ACID) 500 MG tablet Take 1,500 mg by mouth daily.     vitamin E 180 MG (400 UNITS) capsule Take 400 Units by mouth every other  day.     No current facility-administered medications for this visit.    Allergies  Allergen Reactions   Benzonatate     Unknown, patient states "it made me sick"   Chlorpromazine Hcl     unknown   Codeine     REACTION: causes  vomiting   Doxycycline     "explosive diarrhea" -does not wish to take this anymore   Hydrocodone     REACTION: causes vomiing and nausea   Hydrocodone-Acetaminophen Nausea And Vomiting   Lisinopril Cough    Patient states that she will no longer take this medication because it gave her a bad cough    Lovaza [Omega-3-Acid Ethyl Esters] Other (See Comments)    Loose stools with two capsules- can take 1 capsule with no issues    Penicillins     REACTION: causes rash   Prochlorperazine Edisylate     unknown    Promethazine Hcl     unknown   Sulfonamide Derivatives     unknown     Past Medical History:  Diagnosis Date   Allergy    Anxiety    no anxiety per pt- she wants removed    CAD (coronary artery disease)    Cataract    forming    Chronic insomnia    Colon polyp    Depression    GERD (gastroesophageal reflux disease)    GERD with esophagitis    Heart attack ([place]) 06/2007   Heart murmur    Hyperlipemia    Hypertension    Osteopenia    Perirectal abscess    Shingles 08/31/2017   2nd dx with shingles, per pt- shingles x 4 - severe rxn to shingles vax - last shingles 05-2021    There were no vitals taken for this visit.  No problem-specific Assessment & Plan notes found for this encounter.    Tommy Medal PharmD CPP [facility] [address] [place], [place] [address] [phone]

## 2022-03-27 ENCOUNTER — Telehealth: Payer: Self-pay

## 2022-03-27 NOTE — Telephone Encounter (Signed)
Called pt, LVM to discuss scheduling an OV for derm referral she requested.

## 2022-03-27 NOTE — Telephone Encounter (Signed)
-----   Message from Carlynn Purl sent at 03/27/2022 12:54 PM EDT ----- Montez Hageman I am trying to refer this patient but she is going to need office note with diagnosis, to refer.

## 2022-04-03 DIAGNOSIS — H43811 Vitreous degeneration, right eye: Secondary | ICD-10-CM | POA: Diagnosis not present

## 2022-04-03 DIAGNOSIS — I10 Essential (primary) hypertension: Secondary | ICD-10-CM | POA: Diagnosis not present

## 2022-04-03 DIAGNOSIS — H2513 Age-related nuclear cataract, bilateral: Secondary | ICD-10-CM | POA: Diagnosis not present

## 2022-04-03 DIAGNOSIS — H2511 Age-related nuclear cataract, right eye: Secondary | ICD-10-CM | POA: Diagnosis not present

## 2022-04-03 DIAGNOSIS — H04123 Dry eye syndrome of bilateral lacrimal glands: Secondary | ICD-10-CM | POA: Diagnosis not present

## 2022-04-25 DIAGNOSIS — L648 Other androgenic alopecia: Secondary | ICD-10-CM | POA: Diagnosis not present

## 2022-05-03 ENCOUNTER — Other Ambulatory Visit: Payer: Self-pay | Admitting: Cardiovascular Disease

## 2022-05-09 ENCOUNTER — Telehealth: Payer: Self-pay | Admitting: Pharmacist Clinician (PhC)/ Clinical Pharmacy Specialist

## 2022-05-09 MED ORDER — CANDESARTAN CILEXETIL 8 MG PO TABS: 8.0000 mg | ORAL_TABLET | Freq: Every day | ORAL | 3 refills | Status: DC

## 2022-05-09 NOTE — Telephone Encounter (Signed)
At last visit discussed increasing dose, but ultimately decided to stay at 8 mg.  Rx corrected.    Spoke with patient and she is agreeable.

## 2022-05-09 NOTE — Telephone Encounter (Signed)
Patient called to check on status of her call earlier today. Patient is leaving for out of town tomorrow, she would like for this to be resolved  today.

## 2022-05-09 NOTE — Telephone Encounter (Signed)
Pt c/o medication issue:  1. Name of Medication: candesartan (ATACAND) 8 MG tablet   2. How are you currently taking this medication (dosage and times per day)? 8 mg tablet daily  3. Are you having a reaction (difficulty breathing--STAT)? no  4. What is your medication issue? Patient calling to speak with Emily [name] about her candesartan. She says she is supposed to be on the 8 mg dose, but it was denied by the office and the 16 mg dose was sent in. She says she will be at the hair dresser 1:30-2:30 today and it is okay to leave a detailed voicemail. She says she leaves for out of town tomorrow and needs this fixed today.

## 2022-05-12 ENCOUNTER — Other Ambulatory Visit (HOSPITAL_COMMUNITY): Payer: Self-pay | Admitting: Cardiology

## 2022-07-18 ENCOUNTER — Telehealth: Payer: Self-pay | Admitting: Pharmacist Clinician (PhC)/ Clinical Pharmacy Specialist

## 2022-07-18 NOTE — Telephone Encounter (Signed)
Pt would like a callback regarding appt to discuss BP issues. Please advise

## 2022-07-28 ENCOUNTER — Ambulatory Visit (HOSPITAL_COMMUNITY)
Admission: RE | Admit: 2022-07-28 | Discharge: 2022-07-28 | Disposition: A | Discharge status: Home / Self Care | Payer: Medicare HMO | Source: Ambulatory Visit | Attending: Cardiology | Admitting: Cardiology

## 2022-07-28 VITALS — BP 130/78 | HR 73 | Wt 153.0 lb

## 2022-07-28 DIAGNOSIS — I10 Essential (primary) hypertension: Secondary | ICD-10-CM | POA: Insufficient documentation

## 2022-07-28 DIAGNOSIS — I701 Atherosclerosis of renal artery: Secondary | ICD-10-CM | POA: Diagnosis not present

## 2022-07-28 DIAGNOSIS — I351 Nonrheumatic aortic (valve) insufficiency: Secondary | ICD-10-CM | POA: Diagnosis not present

## 2022-07-28 DIAGNOSIS — Z7902 Long term (current) use of antithrombotics/antiplatelets: Secondary | ICD-10-CM | POA: Diagnosis not present

## 2022-07-28 DIAGNOSIS — Z87891 Personal history of nicotine dependence: Secondary | ICD-10-CM | POA: Insufficient documentation

## 2022-07-28 DIAGNOSIS — I2510 Atherosclerotic heart disease of native coronary artery without angina pectoris: Secondary | ICD-10-CM

## 2022-07-28 DIAGNOSIS — Z79899 Other long term (current) drug therapy: Secondary | ICD-10-CM | POA: Insufficient documentation

## 2022-07-28 DIAGNOSIS — I252 Old myocardial infarction: Secondary | ICD-10-CM | POA: Diagnosis not present

## 2022-07-28 DIAGNOSIS — E785 Hyperlipidemia, unspecified: Secondary | ICD-10-CM | POA: Diagnosis not present

## 2022-07-28 DIAGNOSIS — R9431 Abnormal electrocardiogram [ECG] [EKG]: Secondary | ICD-10-CM | POA: Diagnosis not present

## 2022-07-28 LAB — BASIC METABOLIC PANEL
Anion gap: 13 (ref 5–15)
BUN: 15 mg/dL (ref 8–23)
CO2: 23 mmol/L (ref 22–32)
Calcium: 9.1 mg/dL (ref 8.9–10.3)
Chloride: 103 mmol/L (ref 98–111)
Creatinine, Ser: 0.72 mg/dL (ref 0.44–1.00)
GFR, Estimated: >60 mL/min (ref >60)
Glucose, Bld: 123 mg/dL — ABNORMAL HIGH (ref 70–99)
Potassium: 3.3 mmol/L — ABNORMAL LOW (ref 3.5–5.1)
Sodium: 139 mmol/L (ref 135–145)

## 2022-07-28 LAB — LIPID PANEL
Cholesterol: 164 mg/dL (ref 0–200)
HDL: 49 mg/dL (ref >40)
LDL Cholesterol: 92 mg/dL (ref 0–99)
Total CHOL/HDL Ratio: 3.3 RATIO
Triglycerides: 116 mg/dL (ref <150)
VLDL: 23 mg/dL (ref 0–40)

## 2022-07-28 LAB — CBC
HCT: 43.8 % (ref 36.0–46.0)
Hemoglobin: 14.8 g/dL (ref 12.0–15.0)
MCH: 29.5 pg (ref 26.0–34.0)
MCHC: 33.8 g/dL (ref 30.0–36.0)
MCV: 87.4 fL (ref 80.0–100.0)
Platelets: 258 10*3/uL (ref 150–400)
RBC: 5.01 MIL/uL (ref 3.87–5.11)
RDW: 13.2 % (ref 11.5–15.5)
WBC: 6.8 10*3/uL (ref 4.0–10.5)
nRBC: 0 % (ref 0.0–0.2)

## 2022-07-28 LAB — TSH: TSH: 3.505 u[IU]/mL (ref 0.350–4.500)

## 2022-07-28 MED ORDER — ATORVASTATIN CALCIUM 80 MG PO TABS
80.0000 mg | ORAL_TABLET | Freq: Every day | ORAL | 3 refills | Status: DC
Start: 2022-07-28 — End: 2023-08-03

## 2022-07-28 MED ORDER — METOPROLOL SUCCINATE ER 50 MG PO TB24: 50.0000 mg | ORAL_TABLET | Freq: Every day | ORAL | 3 refills | Status: DC

## 2022-07-28 MED ORDER — CLOPIDOGREL BISULFATE 75 MG PO TABS: 75.0000 mg | ORAL_TABLET | Freq: Every day | ORAL | 3 refills | Status: DC

## 2022-07-28 MED ORDER — NITROGLYCERIN 0.4 MG SL SUBL
0.4000 mg | SUBLINGUAL_TABLET | SUBLINGUAL | 3 refills | Status: AC | PRN
Start: 2022-07-28 — End: ?

## 2022-07-28 MED ORDER — FINASTERIDE 5 MG PO TABS: 5.0000 mg | ORAL_TABLET | Freq: Every day | ORAL | 3 refills | Status: DC

## 2022-07-28 NOTE — Patient Instructions (Signed)
There has been no changes to your medications.  Labs done today, your results will be available in MyChart, we will contact you for abnormal readings.  Your physician has requested that you have an echocardiogram. Echocardiography is a painless test that uses sound waves to create images of your heart. It provides your doctor with information about the size and shape of your heart and how well your heart's chambers and valves are working. This procedure takes approximately one hour. There are no restrictions for this procedure. Please do NOT wear cologne, perfume, aftershave, or lotions (deodorant is allowed). Please arrive 15 minutes prior to your appointment time.  Your physician recommends that you schedule a follow-up appointment in: 6 months ( August 2024)  ** please call the office in May to arrange your follow up appointment. **  If you have any questions or concerns before your next appointment please send [place] a message through [place] or call our office at [phone].    TO LEAVE A MESSAGE FOR THE NURSE SELECT OPTION 2, PLEASE LEAVE A MESSAGE INCLUDING: YOUR NAME DATE OF BIRTH CALL BACK NUMBER REASON FOR CALL**this is important as we prioritize the call backs  YOU WILL RECEIVE A CALL BACK THE SAME DAY AS LONG AS YOU CALL BEFORE 4:00 PM  At the [facility], you and your health needs are our priority. As part of our continuing mission to provide you with exceptional heart care, we have created designated Provider Care Teams. These Care Teams include your primary Cardiologist (physician) and Advanced Practice Providers (APPs- Physician Assistants and Nurse Practitioners) who all work together to provide you with the care you need, when you need it.   You may see any of the following providers on your designated Care Team at your next follow up: Dr Glori Bickers Dr [name] Dr. Roxana Hires, NP Lyda Jester, [place] [facility] [place], [place] Forestine Na, NP Audry Riles, PharmD   Please be sure to bring in all your medications bottles to every appointment.    Thank you for choosing [facility]

## 2022-07-29 NOTE — Progress Notes (Signed)
Date:  07/29/2022   ID:  Emily [name], DOB [birth year], MRN [id number]   Provider location: [facility] Type of Visit: Established patient   PCP:  [name], MD  Cardiologist:  Dr. Aundra Dubin   History of Present Illness: Emily [name] is a 72 y.o. female who has a history of CAD s/p inferior MI in 1/09 treated with PCI to [facility].  She has had no cardiac events since that time.  Echo in 9/16 showed EF 55-60% with mild aortic insufficiency.   With elevated BP readings, she was referred to HTN clinic.  Her BP is now controlled on Toprol XL + candesartan.  Renal artery dopplers in 8/21 were concerning for significant left renal artery stenosis.  She saw Dr Gwenlyn Found, and it was decided to follow this conservatively.  Repeat renal artery dopplers in 9/22 again showed >60% left renal artery stenosis, but BP is now significantly improved.   Echo in 4/23 showed EF 55-60%, normal RV, mild-moderate AS with mean gradient 10 mmHg, mild AI.    She presents today for followup of CAD and HTN.  She has been doing well.  BP controlled on higher dose of candesartan, 16 mg daily.  Weight down 6 lbs.  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  No exertional dyspnea or chest pain.  No palpitations.    ECG (personally reviewed): NSR, nonspecific T wave changes.     Labs (12/14): LDL 90, HDL 41 Labs (6/15): LDL 106, HDL 48 Labs (7/16): LDL 102 Labs (2/17): K 4.4, creatinine 0.65, LDL 123, HDL 49, TGs 266 Labs (8/17): LDL 85, TGs 165, HDL 52 Labs (9/18): K 3.4, creatinine 0.66 Labs (1/19): LDL 101, HDL 50 Labs (8/19): LDL 104 Labs (1/20): K 3.7, creatinine 0.65 Labs (6/21): K 3.7, creatinine 0.68, hgb 15, TSH nl Labs (7/21): K 3.8, creatinine 0.68, LDL 91, TGs 108 Labs (6/22): LDL 79, K 3.8, creatinine 0.68 Labs (4/23): LDL 78, TGs 198 Labs (9/23): K 3.6, creatinine 0.78  PMH: 1. CAD: Inferior MI in 1/09 with culprit vessel a large  OM3.  This was treated with PCI.  Had residual 50-70% mLAD stenosis.  Echo (3/14) with EF 60-65%, mild-moderate AI - Echo (9/16): EF 55-60%, inferoseptal akinesis, mild AI.  - Echo (4/23): EF 55-60%, normal RV, mild-moderate AS with mean gradient 10 mmHg, mild AI.   2. HTN - Renal artery dopplers (8/21): >60% left renal artery stenosis.  - Renal artery dopplers (9/22): >60% left RA stenosis.  3. H/o fen-phen use 4. Aortic insufficiency: Mild to moderate on echo in 3/14. Mild on 9/16 echo.  5. Carotid dopplers (9/12) with mild plaque.  - Carotid dopplers (9/21): mild BICA stenosis.  6. Hyperlipidemia 7. GERD 8. Aortic valve disorder: Mild-moderate AS, mild AI on 4/23 echo.   Current Outpatient Medications  Medication Sig Dispense Refill   Acetylcarnitine HCl (ACETYL L-CARNITINE) 250 MG CAPS Take by mouth. 200 mg every other day     ALPRAZolam (XANAX) 0.25 MG tablet Take 1 tablet (0.25 mg total) by mouth 2 (two) times daily as needed for anxiety. 180 tablet 1   b complex vitamins tablet Take 1 tablet by mouth daily.     Biotin 300 MCG TABS Take 600 mcg by mouth every other day.     Calcium-Magnesium-Zinc 500-250-12.5 MG TABS Take 1 tablet by mouth every other day.     candesartan (ATACAND) 8 MG tablet  Take 1 tablet (8 mg total) by mouth daily. (Patient taking differently: Take 8 mg by mouth daily. Has been taking 58m the last few weeks) 90 tablet 3   Cholecalciferol (VITAMIN D) 50 MCG (2000 UT) CAPS Take 2,000 Units by mouth daily.     Chromium Picolinate 200 MCG TABS Take 1 tablet by mouth every other day.     clobetasol ointment (TEMOVATE) 0.05 % as needed.     Coenzyme Q10 (COQ10) 200 MG CAPS Take 1 capsule by mouth daily.     CRANBERRY JUICE EXTRACT PO Take 500 mg by mouth daily.     EPINEPHrine 0.3 mg/0.3 mL IJ SOAJ injection INJECT 0.3 MLS INTO THE MUSCLE ONCE FOR 1 DOSE  0   famotidine (PEPCID AC) 10 MG tablet Take 1 tablet (10 mg total) by mouth 2 (two) times daily. Take  1-2  tablets twice daily 180 tablet 3   Flaxseed, Linseed, (FLAX SEED OIL) 1000 MG CAPS Take 1,200 mg by mouth daily.     Garlic Oil 5XX123456MG TABS Take 1 tablet by mouth daily.     omega-3 acid ethyl esters (LOVAZA) 1 g capsule TAKE 1 CAPSULE BY MOUTH EVERY DAY 90 capsule 1   OVER THE COUNTER MEDICATION Take 1,000 mg by mouth in the morning and at bedtime. Beetroot 1000 mg capsules     Probiotic Product (ALIGN PO) Take 1 capsule by mouth daily.     Rhodiola 300 MG CAPS Take 1 capsule by mouth daily.       sucralfate (CARAFATE) 1 GM/10ML suspension Take 10 mLs (1 g total) by mouth every 8 (eight) hours as needed. 420 mL 1   triamcinolone (NASACORT) 55 MCG/ACT AERO nasal inhaler      vitamin C (ASCORBIC ACID) 500 MG tablet Take 1,500 mg by mouth daily.     vitamin E 180 MG (400 UNITS) capsule Take 400 Units by mouth every other day.     atorvastatin (LIPITOR) 80 MG tablet Take 1 tablet (80 mg total) by mouth daily. 90 tablet 3   clopidogrel (PLAVIX) 75 MG tablet Take 1 tablet (75 mg total) by mouth daily. 90 tablet 3   finasteride (PROSCAR) 5 MG tablet Take 1 tablet (5 mg total) by mouth daily. 90 tablet 3   metoprolol succinate (TOPROL-XL) 50 MG 24 hr tablet Take 1 tablet (50 mg total) by mouth daily. 90 tablet 3   nitroGLYCERIN (NITROSTAT) 0.4 MG SL tablet Place 1 tablet (0.4 mg total) under the tongue every 5 (five) minutes x 3 doses as needed for chest pain. 25 tablet 3   No current facility-administered medications for this encounter.    Allergies:   Benzonatate, Chlorpromazine hcl, Codeine, Doxycycline, Hydrocodone, Hydrocodone-acetaminophen, Lisinopril, Lovaza [omega-3-acid ethyl esters], Penicillins, Prochlorperazine edisylate, Promethazine hcl, and Sulfonamide derivatives   Social History:  The patient  reports that she quit smoking about 16 years ago. Her smoking use included cigarettes. She has a 40.00 pack-year smoking history. She has never used smokeless tobacco. She reports that she does  not currently use alcohol. She reports that she does not use drugs.   Family History:  The patient's family history includes Allergy (severe) in her father; Esophageal cancer in her maternal grandfather; Heart disease in her father; Leukemia in her father; Renal Disease in her paternal grandfather; Skin cancer in her mother.   ROS:  Please see the history of present illness.   All other systems are personally reviewed and negative.   Exam:  BP 130/78   Pulse 73   Wt 69.4 kg (153 lb)   SpO2 96%   BMI 28.91 kg/m   General: NAD Neck: No JVD, no thyromegaly or thyroid nodule.  Lungs: Clear to auscultation bilaterally with normal respiratory effort. CV: Nondisplaced PMI.  Heart regular S1/S2, no S3/S4, 2/6 early SEM RUSB  No peripheral edema.  No carotid bruit.  Normal pedal pulses.  Abdomen: Soft, nontender, no hepatosplenomegaly, no distention.  Skin: Intact without lesions or rashes.  Neurologic: Alert and oriented x 3.  Psych: Normal affect. Extremities: No clubbing or cyanosis.  HEENT: Normal.   Recent Labs: 02/23/2022: ALT 19 07/28/2022: BUN 15; Creatinine, Ser 0.72; Hemoglobin 14.8; Platelets 258; Potassium 3.3; Sodium 139; TSH 3.505  Personally reviewed   Wt Readings from Last 3 Encounters:  07/28/22 69.4 kg (153 lb)  02/23/22 68.9 kg (152 lb)  11/24/21 71.7 kg (158 lb)    ASSESSMENT AND PLAN:  1. CAD: Stable with no ischemic symptoms.  She had PCI in 1/09 in the setting of inferior MI.  She had residual untreated 50-70% mLAD stenosis.  No chest pain.  - Continue Toprol XL and statin. - She has been on Plavix long-term with no evidence for GI bleeding.   2. Hyperlipidemia: Goal LDL < 55.  LDL has been > goal on atorvastatin 80 mg daily. She cannot tolerate Crestor 40 mg daily.  She has not wanted to take Zetia.   - Check lipids today, would recommend Repatha if LDL elevated on atorvastatin.  3. Aortic insufficiency/stenosis: echo in 4/23 with mild AI, mild-moderate AS.  -  Repeat echo in 4/24 to follow valvular disease.  4. HTN: Possibly related to left renal artery stenosis.  However, currently controlled on Toprol XL and candesartan.  - Continue Toprol XL and candesartan. BMET today.  - Has been seen by Dr. Gwenlyn Found, plan medical management of RAS.    Followup 6 months with APP.   Signed, [name], MD  07/29/2022  [facility] [address] [facility] [place] [address] [phone] (office) [phone] (fax)

## 2022-07-31 ENCOUNTER — Telehealth (HOSPITAL_COMMUNITY): Payer: Self-pay | Admitting: *Deleted

## 2022-07-31 ENCOUNTER — Other Ambulatory Visit (HOSPITAL_COMMUNITY): Payer: Self-pay | Admitting: Cardiology

## 2022-07-31 DIAGNOSIS — I351 Nonrheumatic aortic (valve) insufficiency: Secondary | ICD-10-CM

## 2022-07-31 DIAGNOSIS — I2510 Atherosclerotic heart disease of native coronary artery without angina pectoris: Secondary | ICD-10-CM

## 2022-07-31 NOTE — Telephone Encounter (Signed)
Called patient per Dr. Aundra Dubin with lab results and information as noted below. Pt verbalized understanding of same an is open to Lipid Clinic referral. Order placed. Lipid Clinic will contact patient with appointment.     ----- Message from Larey Dresser, MD sent at 07/29/2022 11:57 AM EST ----- Increase K in diet.  Would like to see LDL < 55 with known coronary disease.  Please recommend referral to lipid clinic to start Repatha (non-statin, should have minimal side effects).

## 2022-07-31 NOTE — Telephone Encounter (Signed)
-----   Message from Larey Dresser, MD sent at 07/29/2022 11:57 AM EST ----- Increase K in diet.  Would like to see LDL < 55 with known coronary disease.  Please recommend referral to lipid clinic to start Repatha (non-statin, should have minimal side effects).

## 2022-08-07 ENCOUNTER — Other Ambulatory Visit: Payer: Self-pay | Admitting: Cardiovascular Disease

## 2022-08-10 ENCOUNTER — Encounter (HOSPITAL_BASED_OUTPATIENT_CLINIC_OR_DEPARTMENT_OTHER): Payer: Self-pay | Admitting: Pharmacist Clinician (PhC)/ Clinical Pharmacy Specialist

## 2022-08-11 ENCOUNTER — Ambulatory Visit: Payer: Medicare HMO

## 2022-08-21 ENCOUNTER — Other Ambulatory Visit (HOSPITAL_COMMUNITY): Payer: Self-pay

## 2022-08-21 ENCOUNTER — Telehealth (HOSPITAL_COMMUNITY): Payer: Self-pay

## 2022-08-21 DIAGNOSIS — I10 Essential (primary) hypertension: Secondary | ICD-10-CM

## 2022-08-21 MED ORDER — CANDESARTAN CILEXETIL 8 MG PO TABS: 24.0000 mg | ORAL_TABLET | Freq: Every day | ORAL | 3 refills | Status: DC

## 2022-08-21 NOTE — Telephone Encounter (Signed)
Scheduled for cataract surgery on Thursday. She states her BP has been uncontrolled. He readings are 130's-150's but at night it is running over 170-175. She is taking Candasartan it will come down a little. She is nervous about surgery because her BP is high. Please advise if any medication changes needed.

## 2022-08-21 NOTE — Telephone Encounter (Signed)
Increase candesartan to 24 mg daily if she is taking 16 mg daily. BMET 10 days.    Cataract surgery is low risk, ok to proceed.

## 2022-08-21 NOTE — Telephone Encounter (Signed)
Spoke with patient and she understands change in medication. Labs ordered and scheduled.

## 2022-08-22 ENCOUNTER — Other Ambulatory Visit (HOSPITAL_COMMUNITY): Payer: Self-pay | Admitting: Cardiology

## 2022-09-01 ENCOUNTER — Ambulatory Visit (HOSPITAL_COMMUNITY)
Admission: RE | Admit: 2022-09-01 | Discharge: 2022-09-01 | Disposition: A | Discharge status: Home / Self Care | Payer: Medicare HMO | Source: Ambulatory Visit | Attending: Internal Medicine | Admitting: Internal Medicine

## 2022-09-01 DIAGNOSIS — I10 Essential (primary) hypertension: Secondary | ICD-10-CM | POA: Diagnosis present

## 2022-09-01 LAB — BASIC METABOLIC PANEL
Anion gap: 12 (ref 5–15)
BUN: 14 mg/dL (ref 8–23)
CO2: 25 mmol/L (ref 22–32)
Calcium: 9 mg/dL (ref 8.9–10.3)
Chloride: 100 mmol/L (ref 98–111)
Creatinine, Ser: 0.76 mg/dL (ref 0.44–1.00)
GFR, Estimated: >60 mL/min (ref >60)
Glucose, Bld: 131 mg/dL — ABNORMAL HIGH (ref 70–99)
Potassium: 3.5 mmol/L (ref 3.5–5.1)
Sodium: 137 mmol/L (ref 135–145)

## 2022-10-04 ENCOUNTER — Ambulatory Visit
Payer: Medicare HMO | Attending: Cardiovascular Disease | Admitting: Pharmacist Clinician (PhC)/ Clinical Pharmacy Specialist

## 2022-10-04 ENCOUNTER — Encounter: Payer: Self-pay | Admitting: Pharmacist Clinician (PhC)/ Clinical Pharmacy Specialist

## 2022-10-04 VITALS — BP 130/66 | HR 58 | Ht 61.0 in | Wt 150.0 lb

## 2022-10-04 DIAGNOSIS — I10 Essential (primary) hypertension: Secondary | ICD-10-CM | POA: Diagnosis not present

## 2022-10-04 NOTE — Assessment & Plan Note (Signed)
Assessment: BP is controlled in office BP 130/66 mmHg;  above the goal (<130/80). Has 2 weeks remaining on prednisone ophthalmic drops - post cataract surgery Tolerates candesartan, metoprolol well without any side effects Denies SOB, palpitation, chest pain, headaches,or swelling Reiterated the importance of regular exercise and low salt diet   Plan:  Decrease candesartan to 8 mg daily, can take 16 mg if systolic pressure is >/= to 150 Continue taking metoprolol succinate Patient to cut blood pressure checks back to no more than 3 times daily, and as home readings regulate and stay normal, she should stop home checks altogether.   Patient to follow up with me in 3 months - she will reach out in June via MyChart   Labs ordered today:  BMET - concerned because of recent low K, although most recent was back to 3.5

## 2022-10-04 NOTE — Progress Notes (Signed)
10/04/2022 Emily [name] 08-11-1950 [id number]   HPI:  Emily [name] is a 72 y.o. female patient of Dr Allyson Sabal, with a PMH below who presents today for hypertension clinic evaluation.  Back in 2021 she was referred to the advanced hypertension clinic for labile blood pressure readings, and it was noted that stress affected her readings.  In doing secondary workup, renal dopplers showed 1-59% blockage of right renal artery and > 60% in left.  A follow up CT suggested beaded configuration compatible with fibromuscular dysplasia with 50-60% left blockage.  She has been on metoprolol succ and candesartan for a few years now and tolerates both without issue.  She felt that her pressure was controlled until she had the shingles vaccine, but again we managed to get her back under control and she stopped checking home pressures (was checking up to 6 times/day).    She was doing fine until earlier this year.  In late January she got an UTI and was on Cipro for 14 days (she took 500 mg qd x 14d instead of bid x 7d) then had back to back cataract surgeries, for which she still has 2 weeks to go on prednisone ophthalmic drops.  Lastly, her brother-in-law died suddenly 2 weeks ago and she is trying to help her sister.  In this time, her BP started to rise and she was advised to increase the candesartan to 16 mg daily.  She did this and when numbers didn't change much it was increased again to 24 mg.  She never took the 24 mg dose, but continued with 16, and over the past week or so her pressures are trending down again.   [facility] fall [facility] [employer] and [facility].  Took a second class in January, but had to stop because of the cataract procedures.  Notes that she lost about 10 pounds, but has gained most of it back in the past 3 months.     Past Medical History: Carotid stenosis 9/21 1-39% stenosis bilaterally  hyperlipidemia 4/23 LDL 79 on atorvastatin 80  DM2 6/22 A1c  6.3, down from 6.7 (2017)  Fibromuscular dysplasia With 60% stenosis in Left renal artery    Blood Pressure Goal:  130/80  Current Medications: candesartan 16 mg qd pm;  metoprolol succ 50 mg qd am  Family Hx: father died at 33 (last year) ckd, GI bowel problems, mom at 73 from brain hemorrhage, had enlarged heart 2/2 hypertension (only weighed ~100 lb); 2 children in 45's healthy  Social Hx: no tobacco, quit in 08; occasional alcohol - maybe once a month at most; no coffee, 6 oz coke in the am; otherwise water.  Hot tea if feeling sick  Diet: mix of home and restaurant; does use some salt (light); salad and meat most nights; peanut butter crackers;  More carbs Fri-Sun  Exercise: no regular exercise, hoping to join silver sneakers soon - will start once Gyn cleared   Home BP readings: has been checking 4-6 times daily again. Over the last 7 days her average systolic was 131.    Intolerances: ACEI - cough, omega 3 oils - diarrhea in larger doses  Labs: 4/23: Na 137, K 3.7, Glu 137, BUN 12, SCr 0.54, GFR > 60   Wt Readings from Last 3 Encounters:  10/04/22 150 lb (68 kg)  07/28/22 153 lb (69.4 kg)  02/23/22 152 lb (68.9 kg)   BP Readings from Last 3 Encounters:  10/04/22 130/66  07/28/22 130/78  02/23/22 (!) 146/78   Pulse Readings from Last 3 Encounters:  10/04/22 (!) 58  07/28/22 73  02/23/22 67    Current Outpatient Medications  Medication Sig Dispense Refill   Acetylcarnitine HCl (ACETYL L-CARNITINE) 250 MG CAPS Take by mouth. 200 mg every other day     ALPRAZolam (XANAX) 0.25 MG tablet Take 1 tablet (0.25 mg total) by mouth 2 (two) times daily as needed for anxiety. 180 tablet 1   atorvastatin (LIPITOR) 80 MG tablet Take 1 tablet (80 mg total) by mouth daily. 90 tablet 3   b complex vitamins tablet Take 1 tablet by mouth daily.     Biotin 300 MCG TABS Take 600 mcg by mouth every other day.     Calcium-Magnesium-Zinc 500-250-12.5 MG TABS Take 1 tablet by mouth every  other day.     candesartan (ATACAND) 8 MG tablet Take 3 tablets (24 mg total) by mouth daily. 90 tablet 3   Cholecalciferol (VITAMIN D) 50 MCG (2000 UT) CAPS Take 2,000 Units by mouth daily.     Chromium Picolinate 200 MCG TABS Take 1 tablet by mouth every other day.     clobetasol ointment (TEMOVATE) 0.05 % as needed.     clopidogrel (PLAVIX) 75 MG tablet Take 1 tablet (75 mg total) by mouth daily. 90 tablet 3   Coenzyme Q10 (COQ10) 200 MG CAPS Take 1 capsule by mouth daily.     CRANBERRY JUICE EXTRACT PO Take 500 mg by mouth daily.     EPINEPHrine 0.3 mg/0.3 mL IJ SOAJ injection INJECT 0.3 MLS INTO THE MUSCLE ONCE FOR 1 DOSE  0   famotidine (PEPCID AC) 10 MG tablet Take 1 tablet (10 mg total) by mouth 2 (two) times daily. Take  1-2 tablets twice daily 180 tablet 3   finasteride (PROSCAR) 5 MG tablet Take 1 tablet (5 mg total) by mouth daily. 90 tablet 3   Flaxseed, Linseed, (FLAX SEED OIL) 1000 MG CAPS Take 1,200 mg by mouth daily.     Garlic Oil 500 MG TABS Take 1 tablet by mouth daily.     metoprolol succinate (TOPROL-XL) 50 MG 24 hr tablet Take 1 tablet (50 mg total) by mouth daily. 90 tablet 3   nitroGLYCERIN (NITROSTAT) 0.4 MG SL tablet Place 1 tablet (0.4 mg total) under the tongue every 5 (five) minutes x 3 doses as needed for chest pain. 25 tablet 3   omega-3 acid ethyl esters (LOVAZA) 1 g capsule TAKE 1 CAPSULE BY MOUTH EVERY DAY 90 capsule 1   OVER THE COUNTER MEDICATION Take 1,000 mg by mouth in the morning and at bedtime. Beetroot 1000 mg capsules     Probiotic Product (ALIGN PO) Take 1 capsule by mouth daily.     Rhodiola 300 MG CAPS Take 1 capsule by mouth daily.       sucralfate (CARAFATE) 1 GM/10ML suspension Take 10 mLs (1 g total) by mouth every 8 (eight) hours as needed. 420 mL 1   triamcinolone (NASACORT) 55 MCG/ACT AERO nasal inhaler      vitamin C (ASCORBIC ACID) 500 MG tablet Take 1,500 mg by mouth daily.     vitamin E 180 MG (400 UNITS) capsule Take 400 Units by mouth  every other day.     No current facility-administered medications for this visit.    Allergies  Allergen Reactions   Benzonatate     Unknown, patient states "it made me sick"   Chlorpromazine Hcl  unknown   Codeine     REACTION: causes vomiting   Doxycycline     "explosive diarrhea" -does not wish to take this anymore   Hydrocodone     REACTION: causes vomiing and nausea   Hydrocodone-Acetaminophen Nausea And Vomiting   Lisinopril Cough    Patient states that she will no longer take this medication because it gave her a bad cough    Lovaza [Omega-3-Acid Ethyl Esters] Other (See Comments)    Loose stools with two capsules- can take 1 capsule with no issues    Penicillins     REACTION: causes rash   Prochlorperazine Edisylate     unknown    Promethazine Hcl     unknown   Sulfonamide Derivatives     unknown     Past Medical History:  Diagnosis Date   Allergy    Anxiety    no anxiety per pt- she wants removed    CAD (coronary artery disease)    Cataract    forming    Chronic insomnia    Colon polyp    Depression    GERD (gastroesophageal reflux disease)    GERD with esophagitis    Heart attack 06/2007   Heart murmur    Hyperlipemia    Hypertension    Osteopenia    Perirectal abscess    Shingles 08/31/2017   2nd dx with shingles, per pt- shingles x 4 - severe rxn to shingles vax - last shingles 05-2021    Blood pressure 130/66, pulse (!) 58, height  (1.549 m), weight 150 lb (68 kg).  Essential hypertension Assessment: BP is controlled in office BP 130/66 mmHg;  above the goal (<130/80). Has 2 weeks remaining on prednisone ophthalmic drops - post cataract surgery Tolerates candesartan, metoprolol well without any side effects Denies SOB, palpitation, chest pain, headaches,or swelling Reiterated the importance of regular exercise and low salt diet   Plan:  Decrease candesartan to 8 mg daily, can take 16 mg if systolic pressure is >/= to  150 Continue taking metoprolol succinate Patient to cut blood pressure checks back to no more than 3 times daily, and as home readings regulate and stay normal, she should stop home checks altogether.   Patient to follow up with me in 3 months - she will reach out in June via MyChart   Labs ordered today:  BMET - concerned because of recent low K, although most recent was back to 3.5    Phillips Hay PharmD CPP [facility] [address] [place], [place] [address] [phone]

## 2022-10-04 NOTE — Patient Instructions (Addendum)
Follow up appointment: in 3 months - reach out to me in early June to schedule - you can send a note in MyChartaa  Go to the lab today to check potassium levels  Take your BP meds as follows:  Decrease candesartan to 8 mg daily  Take 16 mg only when the systolic (top) readings is > 150.    Check your blood pressure at home daily (if able) and keep record of the readings.  Hypertension "High blood pressure"  Hypertension is often called "The Silent Killer." It rarely causes symptoms until it is extremely  high or has done damage to other organs in the body. For this reason, you should have your  blood pressure checked regularly by your physician. We will check your blood pressure  every time you see a provider at one of our offices.   Your blood pressure reading consists of two numbers. Ideally, blood pressure should be  below 120/80. The first ("top") number is called the systolic pressure. It measures the  pressure in your arteries as your heart beats. The second ("bottom") number is called the diastolic pressure. It measures the pressure in your arteries as the heart relaxes between beats.  The benefits of getting your blood pressure under control are enormous. A 10-point  reduction in systolic blood pressure can reduce your risk of stroke by 27% and heart failure by 28%  Your blood pressure goal is < 130/80  To check your pressure at home you will need to:  1. Sit up in a chair, with feet flat on the floor and back supported. Do not cross your ankles or legs. 2. Rest your left arm so that the cuff is about heart level. If the cuff goes on your upper arm,  then just relax the arm on the table, arm of the chair or your lap. If you have a wrist cuff, we  suggest relaxing your wrist against your chest (think of it as Pledging the Flag with the  wrong arm).  3. Place the cuff snugly around your arm, about 1 inch above the crook of your elbow. The  cords should be inside the groove  of your elbow.  4. Sit quietly, with the cuff in place, for about 5 minutes. After that 5 minutes press the power  button to start a reading. 5. Do not talk or move while the reading is taking place.  6. Record your readings on a sheet of paper. Although most cuffs have a memory, it is often  easier to see a pattern developing when the numbers are all in front of you.  7. You can repeat the reading after 1-3 minutes if it is recommended  Make sure your bladder is empty and you have not had caffeine or tobacco within the last 30 min  Always bring your blood pressure log with you to your appointments. If you have not brought your monitor in to be double checked for accuracy, please bring it to your next appointment.  You can find a list of quality blood pressure cuffs at validatebp.org

## 2022-10-05 LAB — BASIC METABOLIC PANEL
BUN/Creatinine Ratio: 19 (ref 12–28)
BUN: 14 mg/dL (ref 8–27)
CO2: 24 mmol/L (ref 20–29)
Calcium: 9.6 mg/dL (ref 8.7–10.3)
Chloride: 104 mmol/L (ref 96–106)
Creatinine, Ser: 0.75 mg/dL (ref 0.57–1.00)
Glucose: 106 mg/dL — ABNORMAL HIGH (ref 70–99)
Potassium: 4.4 mmol/L (ref 3.5–5.2)
Sodium: 144 mmol/L (ref 134–144)
eGFR: 85 mL/min/{1.73_m2} (ref >59)

## 2022-10-12 ENCOUNTER — Ambulatory Visit (HOSPITAL_COMMUNITY)
Admission: RE | Admit: 2022-10-12 | Discharge: 2022-10-12 | Disposition: A | Discharge status: Home / Self Care | Payer: Medicare HMO | Source: Ambulatory Visit | Attending: Internal Medicine | Admitting: Internal Medicine

## 2022-10-12 DIAGNOSIS — I2510 Atherosclerotic heart disease of native coronary artery without angina pectoris: Secondary | ICD-10-CM | POA: Diagnosis not present

## 2022-10-12 DIAGNOSIS — I082 Rheumatic disorders of both aortic and tricuspid valves: Secondary | ICD-10-CM | POA: Diagnosis not present

## 2022-10-12 DIAGNOSIS — I110 Hypertensive heart disease with heart failure: Secondary | ICD-10-CM | POA: Diagnosis not present

## 2022-10-12 DIAGNOSIS — I509 Heart failure, unspecified: Secondary | ICD-10-CM | POA: Diagnosis present

## 2022-10-12 DIAGNOSIS — I272 Pulmonary hypertension, unspecified: Secondary | ICD-10-CM | POA: Insufficient documentation

## 2022-10-12 LAB — ECHOCARDIOGRAM COMPLETE
AR max vel: 1.34 cm2
AV Area VTI: 1.4 cm2
AV Area mean vel: 1.26 cm2
AV Mean grad: 14 mmHg
AV Peak grad: 24.4 mmHg
Ao pk vel: 2.47 m/s
Area-P 1/2: 1.56 cm2
P 1/2 time: 522 msec
S' Lateral: 2.4 cm

## 2022-10-12 NOTE — Progress Notes (Signed)
  Echocardiogram 2D Echocardiogram has been performed.  Delcie Roch 10/12/2022, 3:05 PM

## 2022-12-27 ENCOUNTER — Telehealth: Payer: Self-pay | Admitting: Pharmacist Clinician (PhC)/ Clinical Pharmacy Specialist

## 2022-12-27 NOTE — Telephone Encounter (Signed)
Pt c/o BP issue: STAT if pt c/o blurred vision, one-sided weakness or slurred speech  1. What are your last 5 BP readings?  Systolic is in the 140-170's  2. Are you having any other symptoms (ex. Dizziness, headache, blurred vision, passed out)?  No   3. What is your BP issue?   Patient is requesting to speak with Phillips Hay if at all possible. Systolic is running in the 170's.

## 2022-12-28 NOTE — Telephone Encounter (Signed)
LMOM to return call.

## 2022-12-28 NOTE — Telephone Encounter (Signed)
Patient returned call.  States BP did well for some time, but recently has started going up in the evening.  Midnight- 1 am - going up to 160's, highest was 170.  Daytime readings all mostly WNL  Did fall yesterday, bruised tailbone playing pickle ball  Taking candesartan 8 mg daily at midnight.  Will take alprazolam if pressure > 150 as the higher readings tend to cause anxiety  Asked her to take the candesartan 8 mg twice daily (noon and midnight) and check home BP only 2-3 times per day .  She will continue to monitor

## 2023-02-13 ENCOUNTER — Other Ambulatory Visit (HOSPITAL_COMMUNITY): Payer: Self-pay | Admitting: Cardiology

## 2023-03-02 ENCOUNTER — Other Ambulatory Visit: Payer: Self-pay | Admitting: Pharmacist Clinician (PhC)/ Clinical Pharmacy Specialist

## 2023-03-02 ENCOUNTER — Other Ambulatory Visit: Payer: Self-pay | Admitting: Cardiology

## 2023-03-02 MED ORDER — CANDESARTAN CILEXETIL 8 MG PO TABS: 24.0000 mg | ORAL_TABLET | Freq: Two times a day (BID) | ORAL | 5 refills | Status: DC

## 2023-03-05 ENCOUNTER — Telehealth: Payer: Self-pay | Admitting: Pharmacist Clinician (PhC)/ Clinical Pharmacy Specialist

## 2023-03-05 MED ORDER — CANDESARTAN CILEXETIL 8 MG PO TABS: 8.0000 mg | ORAL_TABLET | Freq: Two times a day (BID) | ORAL | 3 refills | Status: DC

## 2023-03-05 NOTE — Telephone Encounter (Signed)
Patient called pharmacy stated her Candesartan was increased to one tablet  twice a day. However the RX called in was to take three times a day.  Patient sates the new dose is wrong and should be the one tablet twice a day. Please advise

## 2023-03-05 NOTE — Telephone Encounter (Signed)
Pt c/o medication issue:  1. Name of Medication: candesartan (ATACAND) 8 MG tablet   2. How are you currently taking this medication (dosage and times per day)?   3. Are you having a reaction (difficulty breathing--STAT)?   4. What is your medication issue? Pharmacy is requesting call back for this medication for patient. They are requesting clarification on how this medication is to be taken. Please advise.

## 2023-03-05 NOTE — Telephone Encounter (Signed)
Will let Phillips Hay answer this, since she was the last one to adjust this medication.

## 2023-04-04 ENCOUNTER — Telehealth: Payer: Self-pay

## 2023-04-04 NOTE — Telephone Encounter (Signed)
Pt c/o BP issue: STAT if pt c/o blurred vision, one-sided weakness or slurred speech   1. What are your last 5 BP readings?    6:22 am  160/84  HR 54 5:00 am   163/87  HR 60 3:00 am  171/82  HR 63 1:30 am  161/80  HR 66 (patient stated she took medication)   2. Are you having any other symptoms (ex. Dizziness, headache, blurred vision, passed out)?   No   3. What is your BP issue?    Patient stated during the day her BP is normal but between 1:00-3:00 am at night her BP increases. Patient wants to know if she needs a change to her medication.  *spoke w/ pt.  She was confused as to why I am calling from [facility] office w/ Dr. Okey Dupre.  After verifying name, DOB and phone #, realized this info was initially sent under the wrong pt.  Safety Zone entered, email sent to my supervisor, as well as the person who sent the message and their supervisor.

## 2023-04-04 NOTE — Telephone Encounter (Signed)
Pt states that she normally sees [employer], but was informed that she is out of the office. Advised her that I will send to the other pharmacists, she is appreciative.

## 2023-04-05 ENCOUNTER — Encounter (HOSPITAL_COMMUNITY): Payer: Self-pay | Admitting: Emergency Medicine

## 2023-04-05 ENCOUNTER — Other Ambulatory Visit: Payer: Self-pay

## 2023-04-05 ENCOUNTER — Emergency Department (HOSPITAL_COMMUNITY)
Admission: EM | Admit: 2023-04-05 | Discharge: 2023-04-05 | Disposition: A | Discharge status: Home / Self Care | Payer: Medicare HMO | Attending: Emergency Medicine | Admitting: Emergency Medicine

## 2023-04-05 DIAGNOSIS — Z79899 Other long term (current) drug therapy: Secondary | ICD-10-CM | POA: Insufficient documentation

## 2023-04-05 DIAGNOSIS — I2510 Atherosclerotic heart disease of native coronary artery without angina pectoris: Secondary | ICD-10-CM | POA: Insufficient documentation

## 2023-04-05 DIAGNOSIS — I10 Essential (primary) hypertension: Secondary | ICD-10-CM | POA: Insufficient documentation

## 2023-04-05 DIAGNOSIS — R030 Elevated blood-pressure reading, without diagnosis of hypertension: Secondary | ICD-10-CM | POA: Diagnosis present

## 2023-04-05 LAB — BASIC METABOLIC PANEL
Anion gap: 9 (ref 5–15)
BUN: 16 mg/dL (ref 8–23)
CO2: 25 mmol/L (ref 22–32)
Calcium: 8.8 mg/dL — ABNORMAL LOW (ref 8.9–10.3)
Chloride: 104 mmol/L (ref 98–111)
Creatinine, Ser: 0.79 mg/dL (ref 0.44–1.00)
GFR, Estimated: >60 mL/min (ref >60)
Glucose, Bld: 115 mg/dL — ABNORMAL HIGH (ref 70–99)
Potassium: 3.6 mmol/L (ref 3.5–5.1)
Sodium: 138 mmol/L (ref 135–145)

## 2023-04-05 LAB — URINALYSIS, W/ REFLEX TO CULTURE (INFECTION SUSPECTED)
Bilirubin Urine: NEGATIVE
Glucose, UA: NEGATIVE mg/dL
Hgb urine dipstick: NEGATIVE
Ketones, ur: NEGATIVE mg/dL
Leukocytes,Ua: NEGATIVE
Nitrite: NEGATIVE
Protein, ur: NEGATIVE mg/dL
Specific Gravity, Urine: 1.023 (ref 1.005–1.030)
pH: 5 (ref 5.0–8.0)

## 2023-04-05 NOTE — ED Triage Notes (Signed)
Patient BIB [facility] c/o htn for 2 weeks, no other symptoms.  Patient called EMS this morning because she said her BP was in the 190s and her PCP advised her to go to ER if it gets high.  Patient being followed by htn clinic and has been taking meds as prescribed.

## 2023-04-05 NOTE — Discharge Instructions (Signed)
You were seen in the ER today for high blood pressure Your blood pressure improved while you were in the emergency department. Your blood work was reassuring, your kidney function was normal, your calcium was slightly low at 8.8. Your urine did not appear consistent with a UTI. Please continue your blood pressure medication regimen as prescribed by your outpatient provider. As discussed, consider altering your dosing times from 3PM and 3AM to 9AM/9PM or 10AM/10PM.   Call your medical office to make them aware of your ED visit and to try to schedule closer follow up. Return to the ED for new or worsening symptoms including but not limited to chest pain, trouble breathing, severe headache, change in your vision, numbness, weakness, dizziness, trouble walking, passing out, chest pressure, nausea/vomiting, fever or any other concerns.

## 2023-04-05 NOTE — Telephone Encounter (Signed)
Unclear why patient is checking her blood pressure in the middle of the night. Looks like she called in with the same concern in July and this was Kristin's response:  Patient returned call.  States BP did well for some time, but recently has started going up in the evening.  Midnight- 1 am - going up to 160's, highest was 170.  Daytime readings all mostly WNL   Did fall yesterday, bruised tailbone playing pickle ball   Taking candesartan 8 mg daily at midnight.  Will take alprazolam if pressure > 150 as the higher readings tend to cause anxiety   Asked her to take the candesartan 8 mg twice daily (noon and midnight) and check home BP only 2-3 times per day .  She will continue to monitor   Appears as though pt went to the ED this morning for her BP. Has a lot of anxiety about her blood pressure. Will await pt discharge.

## 2023-04-05 NOTE — ED Notes (Signed)
PT ambulated to the bathroom without assistance

## 2023-04-05 NOTE — ED Provider Notes (Signed)
[place] EMERGENCY DEPARTMENT AT [facility] Provider Note   CSN: [id number] Arrival date & time: 04/05/23  8413     History  Chief Complaint  Patient presents with   Hypertension    Emily [name] is a 72 y.o. female with a hx of CAD, GERD, HTN, hyperlipidemia, and anxiety who presents to the ED via EMS with complaints of worsened hypertension over the past 2 weeks. Patient reports that she has a lot of anxiety regarding her blood pressure, she consistently checks this at 0300 and 1500 and takes her Candesartan 8 mg BID at these times as well. If her BP is > 150 systolic she sometimes takes 16 mg at 3AM, additionally takes Xanax as she was instructed to do so by her healthcare provider. This AM she noted her BP to be elevated in the 170s despite 16 mg of Candesartan and her Xanax, she serially checked it without much improvement, had a reading in the 190s prompting call to EMS. She notes over the past 2 weeks it has been more elevated. She has checked her BP routinely for years.  Has appointment with the provider that manages her hypertension scheduled in 1 month. She does report a great deal of anxiety in regards to her BP with her history of MI.   Denies change in vision, numbness, weakness, dizziness, headache, chest pain, dyspnea, nausea, vomiting, or leg pain/swelling.   HPI     Home Medications Prior to Admission medications   Medication Sig Start Date End Date Taking? Authorizing Provider  Acetylcarnitine HCl (ACETYL L-CARNITINE) 250 MG CAPS Take by mouth. 200 mg every other day    [provider]  ALPRAZolam (XANAX) 0.25 MG tablet Take 1 tablet (0.25 mg total) by mouth 2 (two) times daily as needed for anxiety. 02/23/22   Etta Grandchild, MD  atorvastatin (LIPITOR) 80 MG tablet Take 1 tablet (80 mg total) by mouth daily. 07/28/22   Laurey Morale, MD  b complex vitamins tablet Take 1 tablet by mouth daily.    [provider]  Biotin 300 MCG TABS  Take 600 mcg by mouth every other day.    [provider]  Calcium-Magnesium-Zinc 500-250-12.5 MG TABS Take 1 tablet by mouth every other day.    [provider]  candesartan (ATACAND) 8 MG tablet Take 1 tablet (8 mg total) by mouth 2 (two) times daily. 03/05/23   Laurey Morale, MD  Cholecalciferol (VITAMIN D) 50 MCG (2000 UT) CAPS Take 2,000 Units by mouth daily.    [provider]  Chromium Picolinate 200 MCG TABS Take 1 tablet by mouth every other day.    [provider]  clobetasol ointment (TEMOVATE) 0.05 % as needed. 09/27/20   [provider]  clopidogrel (PLAVIX) 75 MG tablet Take 1 tablet (75 mg total) by mouth daily. 07/28/22   Laurey Morale, MD  Coenzyme Q10 (COQ10) 200 MG CAPS Take 1 capsule by mouth daily.    [provider]  CRANBERRY JUICE EXTRACT PO Take 500 mg by mouth daily.    [provider]  EPINEPHrine 0.3 mg/0.3 mL IJ SOAJ injection INJECT 0.3 MLS INTO THE MUSCLE ONCE FOR 1 DOSE 09/03/17   [provider]  famotidine (PEPCID AC) 10 MG tablet Take 1 tablet (10 mg total) by mouth 2 (two) times daily. Take  1-2 tablets twice daily 03/19/18   Armbruster, Willaim Rayas, MD  finasteride (PROSCAR) 5 MG tablet Take 1 tablet (5 mg total)  by mouth daily. 07/28/22   Laurey Morale, MD  Flaxseed, Linseed, (FLAX SEED OIL) 1000 MG CAPS Take 1,200 mg by mouth daily.    [provider]  Garlic Oil 500 MG TABS Take 1 tablet by mouth daily.    [provider]  metoprolol succinate (TOPROL-XL) 50 MG 24 hr tablet Take 1 tablet (50 mg total) by mouth daily. 07/28/22   Laurey Morale, MD  nitroGLYCERIN (NITROSTAT) 0.4 MG SL tablet Place 1 tablet (0.4 mg total) under the tongue every 5 (five) minutes x 3 doses as needed for chest pain. 07/28/22   Laurey Morale, MD  omega-3 acid ethyl esters (LOVAZA) 1 g capsule TAKE 1 CAPSULE BY MOUTH EVERY DAY 02/15/23   Laurey Morale, MD  OVER THE COUNTER MEDICATION Take 1,000  mg by mouth in the morning and at bedtime. Beetroot 1000 mg capsules    [provider]  Probiotic Product (ALIGN PO) Take 1 capsule by mouth daily.    [provider]  Rhodiola 300 MG CAPS Take 1 capsule by mouth daily.      [provider]  sucralfate (CARAFATE) 1 GM/10ML suspension Take 10 mLs (1 g total) by mouth every 8 (eight) hours as needed. 10/21/20   Armbruster, Willaim Rayas, MD  triamcinolone (NASACORT) 55 MCG/ACT AERO nasal inhaler     [provider]  vitamin C (ASCORBIC ACID) 500 MG tablet Take 1,500 mg by mouth daily.    [provider]  vitamin E 180 MG (400 UNITS) capsule Take 400 Units by mouth every other day.    [provider]      Allergies    Benzonatate, Chlorpromazine hcl, Codeine, Doxycycline, Hydrocodone, Hydrocodone-acetaminophen, Lisinopril, Lovaza [omega-3-acid ethyl esters], Penicillins, Prochlorperazine edisylate, Promethazine hcl, and Sulfonamide derivatives    Review of Systems   Review of Systems  Constitutional:  Negative for chills and fever.  Eyes:  Negative for visual disturbance.  Respiratory:  Negative for shortness of breath.   Cardiovascular:  Negative for chest pain.  Gastrointestinal:  Negative for abdominal pain, nausea and vomiting.  Genitourinary:  Negative for dysuria, hematuria and urgency.  Neurological:  Negative for dizziness, syncope, weakness, light-headedness, numbness and headaches.  All other systems reviewed and are negative.   Physical Exam Updated Vital Signs BP (!) 193/81   Pulse 78   Temp 98.2 F (36.8 C) (Oral)   Resp 18   Wt 66.5 kg   SpO2 99%   BMI 27.72 kg/m  Physical Exam Vitals and nursing note reviewed.  Constitutional:      General: She is not in acute distress.    Appearance: She is well-developed. She is not toxic-appearing.  HENT:     Head: Normocephalic and atraumatic.  Eyes:     General:        Right eye: No discharge.        Left eye: No discharge.      Conjunctiva/sclera: Conjunctivae normal.  Cardiovascular:     Rate and Rhythm: Normal rate and regular rhythm.     Pulses:          Radial pulses are 2+ on the right side and 2+ on the left side.     Heart sounds: Murmur heard.  Pulmonary:     Effort: No respiratory distress.     Breath sounds: Normal breath sounds. No wheezing or rales.  Abdominal:     General: There is no distension.     Palpations: Abdomen is  soft.     Tenderness: There is no abdominal tenderness.  Musculoskeletal:     Cervical back: Neck supple.  Skin:    General: Skin is warm and dry.  Neurological:     Mental Status: She is alert.     Comments: Clear speech. Alert. Clear speech. No facial droop. CNIII-XII grossly intact. Bilateral upper and lower extremities' sensation grossly intact. 5/5 symmetric strength with grip strength and with plantar and dorsi flexion bilaterally. Normal finger to nose bilaterally. Negative pronator drift.  Psychiatric:        Behavior: Behavior normal.     ED Results / Procedures / Treatments   Labs (all labs ordered are listed, but only abnormal results are displayed) Labs Reviewed - No data to display  EKG None  Radiology No results found.  Procedures Procedures    Medications Ordered in ED Medications - No data to display  ED Course/ Medical Decision Making/ A&P                                 Medical Decision Making Amount and/or Complexity of Data Reviewed Labs: ordered.   Patient presents to the ED with complaints of hypertension, this involves an extensive number of treatment options, and is a complaint that carries with it a high risk of complications and morbidity. Nontoxic, vitals w/ elevated BP to 190s/80s on arrival, otherwise unremarkable. .   Additional history obtained:  Chart/nursing notes reviewed.   Lab Tests:  I viewed & interpreted labs including:  BMP: Nl renal function, no critical electrolyte derangement.   Patient requested we  check a UA due to concern that a UTI could be causing her high blood pressure, and mentioned a brief twinge of back pain a few days ago that has not re-occurred and did not persist which made her concerned this could be the case. She denies dysuria, urgency, frequency, hematuria, fever or chills. Through shared decision making a UA was obtained- rare bacteria, nitrite/leuk esterase negative, not consistent w/ UTI especially in the setting of being relatively asymptomatic.   Patient with asymptomatic hypertension, benign exam, nl renal function, do not suspect hypertensive emergency. BP improved in the ED. We discussed checking her BP less frequently from an anxiety perspective. Plan for follow up with her outpatient provider who manages her blood pressure. I discussed results, treatment plan, need for follow-up, and return precautions with the patient. Provided opportunity for questions, patient confirmed understanding and is in agreement with plan.   Based on patient's chief complaint, I considered admission might be necessary, however after reassuring ED workup feel patient is reasonable for discharge.   Vitals:   04/05/23 0930 04/05/23 1155  BP: (!) 147/71 136/68  Pulse:  (!) 54  Resp: 13 15  Temp:  98.1 F (36.7 C)  SpO2:  100%    This is a shared visit with supervising physician Dr. Rodena Medin who has independently evaluated patient & provided guidance in evaluation/management/disposition, in agreement with care   Portions of this note were generated with Dragon dictation software. Dictation errors may occur despite best attempts at proofreading.  Final Clinical Impression(s) / ED Diagnoses Final diagnoses:  Hypertension, unspecified type    Rx / DC Orders ED Discharge Orders     None         Cherly Anderson, PA-C 04/05/23 1601    Gilda Crease, MD 04/06/23 [phone]

## 2023-04-06 NOTE — Telephone Encounter (Signed)
Reviewed ED note:  "Patient reports that she has a lot of anxiety regarding her blood pressure, she consistently checks this at 0300 and 1500 and takes her Candesartan 8 mg BID at these times as well. If her BP is > 150 systolic she sometimes takes 16 mg at 3AM, additionally takes Xanax as she was instructed to do so by her healthcare provider. This AM she noted her BP to be elevated in the 170s despite 16 mg of Candesartan and her Xanax, she serially checked it without much improvement, had a reading in the 190s prompting call to EMS. She notes over the past 2 weeks it has been more elevated.   BP in ED was 193/81. Patient requested we check a UA due to concern that a UTI could be causing her high blood pressure, and mentioned a brief twinge of back pain a few days ago that has not re-occurred and did not persist which made her concerned this could be the case. She denies dysuria, urgency, frequency, hematuria, fever or chills. Through shared decision making a UA was obtained- rare bacteria, nitrite/leuk esterase negative, not consistent w/ UTI especially in the setting of being relatively asymptomatic.    Patient with asymptomatic hypertension, benign exam, nl renal function, do not suspect hypertensive emergency. BP improved in the ED (147/71, then 136/68). We discussed checking her BP less frequently from an anxiety perspective. Plan for follow up with her outpatient provider who manages her blood pressure."   BP normal at last office visit with Belenda Cruise in April on same regimen of candesartan and metoprolol. Prior cough on lisinopril. Seems like BP increases with anxiety. Candesartan max dose is 32mg  daily, could consider increasing this vs adding on low dose of amlodipine if pt would like. Sees Kristin on 10/25 for follow up.  Called pt, no answer, left message.

## 2023-04-10 NOTE — Telephone Encounter (Signed)
Patient is returning call.  °

## 2023-04-11 NOTE — Telephone Encounter (Signed)
Spoke with patient, no questions, has appt set for Friday 10/25

## 2023-04-13 ENCOUNTER — Ambulatory Visit
Payer: Medicare HMO | Attending: Cardiovascular Disease | Admitting: Pharmacist Clinician (PhC)/ Clinical Pharmacy Specialist

## 2023-04-13 VITALS — BP 117/66 | HR 56 | Ht 61.0 in | Wt 146.4 lb

## 2023-04-13 DIAGNOSIS — I10 Essential (primary) hypertension: Secondary | ICD-10-CM | POA: Diagnosis not present

## 2023-04-13 NOTE — Patient Instructions (Signed)
Follow up appointment: Thursday December 5 at 3:30 pm  Take your BP meds as follows:  Cut metoprolol succinate in half and take 1/2 tablet (25 mg) twice daily at 3 am and 3 pm  Continue candesartan 8 mg at 3 pm, 8-10 pm and 3 am  Check your blood pressure at home daily (if able) and keep record of the readings.  Hypertension "High blood pressure"  Hypertension is often called "The Silent Killer." It rarely causes symptoms until it is extremely  high or has done damage to other organs in the body. For this reason, you should have your  blood pressure checked regularly by your physician. We will check your blood pressure  every time you see a provider at one of our offices.   Your blood pressure reading consists of two numbers. Ideally, blood pressure should be  below 120/80. The first ("top") number is called the systolic pressure. It measures the  pressure in your arteries as your heart beats. The second ("bottom") number is called the diastolic pressure. It measures the pressure in your arteries as the heart relaxes between beats.  The benefits of getting your blood pressure under control are enormous. A 10-point  reduction in systolic blood pressure can reduce your risk of stroke by 27% and heart failure by 28%  Your blood pressure goal is < 130/80  To check your pressure at home you will need to:  1. Sit up in a chair, with feet flat on the floor and back supported. Do not cross your ankles or legs. 2. Rest your left arm so that the cuff is about heart level. If the cuff goes on your upper arm,  then just relax the arm on the table, arm of the chair or your lap. If you have a wrist cuff, we  suggest relaxing your wrist against your chest (think of it as Pledging the Flag with the  wrong arm).  3. Place the cuff snugly around your arm, about 1 inch above the crook of your elbow. The  cords should be inside the groove of your elbow.  4. Sit quietly, with the cuff in place, for  about 5 minutes. After that 5 minutes press the power  button to start a reading. 5. Do not talk or move while the reading is taking place.  6. Record your readings on a sheet of paper. Although most cuffs have a memory, it is often  easier to see a pattern developing when the numbers are all in front of you.  7. You can repeat the reading after 1-3 minutes if it is recommended  Make sure your bladder is empty and you have not had caffeine or tobacco within the last 30 min  Always bring your blood pressure log with you to your appointments. If you have not brought your monitor in to be double checked for accuracy, please bring it to your next appointment.  You can find a list of quality blood pressure cuffs at validatebp.org

## 2023-04-13 NOTE — Assessment & Plan Note (Signed)
Assessment: BP is controlled in office BP 117/56 mmHg;  Tolerates candesartan and metoprolol well without any side effects Denies SOB, palpitation, chest pain, headaches,or swelling Home BP readings WNL with the exception of 3 am, which can be up to 30 points higher - this causes her anxiety and keeps her from going to sleep (usual bedtime 3-5 am) Reiterated the importance of regular exercise and low salt diet   Plan:  Start taking metoprolol in divided doses.  Metoprolol succ 25 mg bid (3 pm, 3 am) Continue taking candesartan 8 mg tid  Patient to keep record of BP readings with heart rate and report to [place] at the next visit Patient to follow up with PharmD in 1 month  Labs ordered today:  none

## 2023-04-13 NOTE — Progress Notes (Signed)
04/13/2023 Emily [name] [birth year] [id number]   HPI:  Emily [name] is a 72 y.o. female patient of Dr Allyson Sabal, with a PMH below who presents today for hypertension clinic evaluation.  Back in 2021 she was referred to the advanced hypertension clinic for labile blood pressure readings, and it was noted that stress affected her readings.  In doing secondary workup, renal dopplers showed 1-59% blockage of right renal artery and > 60% in left.  A follow up CT suggested beaded configuration compatible with fibromuscular dysplasia with 50-60% left blockage.  She has been on metoprolol succ and candesartan for a few years now and tolerates both without issue.  She felt that her pressure was controlled until she had the shingles vaccine, but again we managed to get her back under control and she stopped checking home pressures (was checking up to 6 times/day).    I last saw her in April, at which time she was doing well on candesartan and metoprolol.   Since then she has reached out a few times, and as of today has been taking candesartan 8 mg tid (3 pm, 10 pm and 3 am) and metoprolol succ 50 mg daily.  She routinely checks BP 4 times per day, with the "last" being around 3 am before she goes to bed.  While daytime readings had mostly been WNL, this was usually elevated, prompting her to add in the 10 pm dose of candesartan.    Today she tells me that her midnight and 3 am BP readings are usually in a reclined position, whereas other readings are seated.  See BP averages below.   Unfortunately she starts to worry with the elevated 3 am readings.  She was diagnosed with a UTI since I saw her last and took cephalexin for about 3 weeks, unable to clear it.  She was hesitant to use cipro, as she suspected it raised her BP previously.  Finally gave in and took the cipro this time around.  BP increases were mild and she now understands to use the cipro and live with a few elevated readings, but clear the  infection.   Past Medical History: Carotid stenosis 9/21 1-39% stenosis bilaterally  hyperlipidemia 4/23 LDL 79 on atorvastatin 80  DM2 6/22 A1c 6.3, down from 6.7 (2017)  Fibromuscular dysplasia With 60% stenosis in Left renal artery    Blood Pressure Goal:  130/80  Current Medications: candesartan 8 mg (3 pm, 10 pm, 3 am);  metoprolol succ 50 mg qd am  Family Hx: father died at 27 (last year) ckd, GI bowel problems, mom at 64 from brain hemorrhage, had enlarged heart 2/2 hypertension (only weighed ~100 lb); 2 children in 11's healthy  Social Hx: no tobacco, quit in 08; occasional alcohol - maybe once a month at most; no coffee, 6 oz coke in the am; otherwise water.  Hot tea if feeling sick  Diet: mix of home and restaurant; does use some salt (light); salad and meat most nights; peanut butter crackers;  More carbs Fri-Sun  Exercise: no regular exercise, hoping to join silver sneakers soon - will start once Gyn cleared   Home BP readings: has been checking 4-6 times daily again.  3 am  average 153/79 HR  59 3 pm average  120/70  HR 60  7 pm average  115/66  HR 60 10 pm average  125/68  HR 62  Intolerances: ACEI - cough, omega 3 oils - diarrhea in larger doses  Labs:  4/23: Na 137, K 3.7, Glu 137, BUN 12, SCr 0.54, GFR > 60   Wt Readings from Last 3 Encounters:  04/13/23 146 lb 6.4 oz (66.4 kg)  04/05/23 146 lb 11.2 oz (66.5 kg)  10/04/22 150 lb (68 kg)   BP Readings from Last 3 Encounters:  04/13/23 117/66  04/05/23 136/68  10/04/22 130/66   Pulse Readings from Last 3 Encounters:  04/13/23 (!) 56  04/05/23 (!) 54  10/04/22 (!) 58    Current Outpatient Medications  Medication Sig Dispense Refill   Acetylcarnitine HCl (ACETYL L-CARNITINE) 250 MG CAPS Take by mouth. 200 mg every other day     ALPRAZolam (XANAX) 0.25 MG tablet Take 1 tablet (0.25 mg total) by mouth 2 (two) times daily as needed for anxiety. 180 tablet 1   atorvastatin (LIPITOR) 80 MG tablet Take 1  tablet (80 mg total) by mouth daily. 90 tablet 3   b complex vitamins tablet Take 1 tablet by mouth daily.     Biotin 300 MCG TABS Take 600 mcg by mouth every other day.     Calcium-Magnesium-Zinc 500-250-12.5 MG TABS Take 1 tablet by mouth every other day.     candesartan (ATACAND) 8 MG tablet Take 1 tablet (8 mg total) by mouth 2 (two) times daily. 60 tablet 3   Cholecalciferol (VITAMIN D) 50 MCG (2000 UT) CAPS Take 2,000 Units by mouth daily.     Chromium Picolinate 200 MCG TABS Take 1 tablet by mouth every other day.     clobetasol ointment (TEMOVATE) 0.05 % as needed.     clopidogrel (PLAVIX) 75 MG tablet Take 1 tablet (75 mg total) by mouth daily. 90 tablet 3   Coenzyme Q10 (COQ10) 200 MG CAPS Take 1 capsule by mouth daily.     CRANBERRY JUICE EXTRACT PO Take 500 mg by mouth daily.     EPINEPHrine 0.3 mg/0.3 mL IJ SOAJ injection INJECT 0.3 MLS INTO THE MUSCLE ONCE FOR 1 DOSE  0   famotidine (PEPCID AC) 10 MG tablet Take 1 tablet (10 mg total) by mouth 2 (two) times daily. Take  1-2 tablets twice daily 180 tablet 3   finasteride (PROSCAR) 5 MG tablet Take 1 tablet (5 mg total) by mouth daily. 90 tablet 3   Flaxseed, Linseed, (FLAX SEED OIL) 1000 MG CAPS Take 1,200 mg by mouth daily.     Garlic Oil 500 MG TABS Take 1 tablet by mouth daily.     metoprolol succinate (TOPROL-XL) 50 MG 24 hr tablet Take 1 tablet (50 mg total) by mouth daily. 90 tablet 3   nitroGLYCERIN (NITROSTAT) 0.4 MG SL tablet Place 1 tablet (0.4 mg total) under the tongue every 5 (five) minutes x 3 doses as needed for chest pain. 25 tablet 3   omega-3 acid ethyl esters (LOVAZA) 1 g capsule TAKE 1 CAPSULE BY MOUTH EVERY DAY 90 capsule 0   OVER THE COUNTER MEDICATION Take 1,000 mg by mouth in the morning and at bedtime. Beetroot 1000 mg capsules     Probiotic Product (ALIGN PO) Take 1 capsule by mouth daily.     Rhodiola 300 MG CAPS Take 1 capsule by mouth daily.       sucralfate (CARAFATE) 1 GM/10ML suspension Take 10 mLs  (1 g total) by mouth every 8 (eight) hours as needed. 420 mL 1   triamcinolone (NASACORT) 55 MCG/ACT AERO nasal inhaler      vitamin C (ASCORBIC ACID) 500 MG tablet Take 1,500 mg by mouth  daily.     vitamin E 180 MG (400 UNITS) capsule Take 400 Units by mouth every other day.     No current facility-administered medications for this visit.    Allergies  Allergen Reactions   Benzonatate     Unknown, patient states "it made me sick"   Chlorpromazine Hcl     unknown   Codeine     REACTION: causes vomiting   Doxycycline     "explosive diarrhea" -does not wish to take this anymore   Hydrocodone     REACTION: causes vomiing and nausea   Hydrocodone-Acetaminophen Nausea And Vomiting   Lisinopril Cough    Patient states that she will no longer take this medication because it gave her a bad cough    Lovaza [Omega-3-Acid Ethyl Esters] Other (See Comments)    Loose stools with two capsules- can take 1 capsule with no issues    Penicillins     REACTION: causes rash   Prochlorperazine Edisylate     unknown    Promethazine Hcl     unknown   Sulfonamide Derivatives     unknown     Past Medical History:  Diagnosis Date   Allergy    Anxiety    no anxiety per pt- she wants removed    CAD (coronary artery disease)    Cataract    forming    Chronic insomnia    Colon polyp    Depression    GERD (gastroesophageal reflux disease)    GERD with esophagitis    Heart attack (HCC) 06/2007   Heart murmur    Hyperlipemia    Hypertension    Osteopenia    Perirectal abscess    Shingles 08/31/2017   2nd dx with shingles, per pt- shingles x 4 - severe rxn to shingles vax - last shingles 05-2021    Blood pressure 117/66, pulse (!) 56, height 5\' 1"  (1.549 m), weight 146 lb 6.4 oz (66.4 kg).  Essential hypertension Assessment: BP is controlled in office BP 117/56 mmHg;  Tolerates candesartan and metoprolol well without any side effects Denies SOB, palpitation, chest pain, headaches,or  swelling Home BP readings WNL with the exception of 3 am, which can be up to 30 points higher - this causes her anxiety and keeps her from going to sleep (usual bedtime 3-5 am) Reiterated the importance of regular exercise and low salt diet   Plan:  Start taking metoprolol in divided doses.  Metoprolol succ 25 mg bid (3 pm, 3 am) Continue taking candesartan 8 mg tid  Patient to keep record of BP readings with heart rate and report to [place] at the next visit Patient to follow up with PharmD in 1 month  Labs ordered today:  none    Phillips Hay PharmD CPP [facility] [address] [place], [place] [address] [phone]

## 2023-04-18 ENCOUNTER — Telehealth: Payer: Self-pay | Admitting: Internal Medicine

## 2023-04-18 NOTE — Telephone Encounter (Signed)
Pt would like to transfer to dr [place]. Please advise

## 2023-04-20 ENCOUNTER — Encounter (HOSPITAL_COMMUNITY): Payer: Medicare HMO

## 2023-04-20 NOTE — Telephone Encounter (Signed)
Fine with me. Thanks, BJ 

## 2023-04-26 ENCOUNTER — Other Ambulatory Visit: Payer: Self-pay | Admitting: Cardiology

## 2023-04-26 NOTE — Telephone Encounter (Signed)
Lmom for pt to call back. 

## 2023-04-26 NOTE — Telephone Encounter (Signed)
Pt has been sch for 07-30-2023

## 2023-05-01 ENCOUNTER — Telehealth: Payer: Self-pay | Admitting: Pharmacist Clinician (PhC)/ Clinical Pharmacy Specialist

## 2023-05-01 MED ORDER — HYDRALAZINE HCL 25 MG PO TABS: ORAL_TABLET | ORAL | 3 refills | Status: DC

## 2023-05-01 NOTE — Telephone Encounter (Signed)
Patient called to report home BP readings going up to > 180 for past couple of weeks.  Had UTI and was treated with cipro x 5 days.  In the past this has caused her pressure to go up throughout infection and for about 1-2 weeks after.    She has called EMS x 2 in past few weeks, although the second time she declined ED visit.  Will have her add hydralazine 25 mg prn SBP > 160.  She is aware to check BP three times daily and okay to take medication 3 times per day.    Patient voiced understanding.  Has follow up with me on Dec 5

## 2023-05-02 ENCOUNTER — Emergency Department (HOSPITAL_COMMUNITY)
Admission: EM | Admit: 2023-05-02 | Discharge: 2023-05-02 | Disposition: A | Discharge status: Home / Self Care | Payer: Medicare HMO | Attending: Emergency Medicine | Admitting: Emergency Medicine

## 2023-05-02 ENCOUNTER — Telehealth: Payer: Self-pay | Admitting: Pharmacist Clinician (PhC)/ Clinical Pharmacy Specialist

## 2023-05-02 ENCOUNTER — Encounter (HOSPITAL_COMMUNITY): Payer: Self-pay

## 2023-05-02 DIAGNOSIS — I10 Essential (primary) hypertension: Secondary | ICD-10-CM | POA: Insufficient documentation

## 2023-05-02 DIAGNOSIS — E876 Hypokalemia: Secondary | ICD-10-CM | POA: Insufficient documentation

## 2023-05-02 LAB — CBC WITH DIFFERENTIAL/PLATELET
Abs Immature Granulocytes: 0.02 10*3/uL (ref 0.00–0.07)
Basophils Absolute: 0.1 10*3/uL (ref 0.0–0.1)
Basophils Relative: 1 %
Eosinophils Absolute: 0.4 10*3/uL (ref 0.0–0.5)
Eosinophils Relative: 4 %
HCT: 41.2 % (ref 36.0–46.0)
Hemoglobin: 13.8 g/dL (ref 12.0–15.0)
Immature Granulocytes: 0 %
Lymphocytes Relative: 27 %
Lymphs Abs: 2.4 10*3/uL (ref 0.7–4.0)
MCH: 29.4 pg (ref 26.0–34.0)
MCHC: 33.5 g/dL (ref 30.0–36.0)
MCV: 87.7 fL (ref 80.0–100.0)
Monocytes Absolute: 0.6 10*3/uL (ref 0.1–1.0)
Monocytes Relative: 7 %
Neutro Abs: 5.2 10*3/uL (ref 1.7–7.7)
Neutrophils Relative %: 61 %
Platelets: 244 10*3/uL (ref 150–400)
RBC: 4.7 MIL/uL (ref 3.87–5.11)
RDW: 13.5 % (ref 11.5–15.5)
WBC: 8.6 10*3/uL (ref 4.0–10.5)
nRBC: 0 % (ref 0.0–0.2)

## 2023-05-02 LAB — BASIC METABOLIC PANEL
Anion gap: 7 (ref 5–15)
BUN: 10 mg/dL (ref 8–23)
CO2: 28 mmol/L (ref 22–32)
Calcium: 8.8 mg/dL — ABNORMAL LOW (ref 8.9–10.3)
Chloride: 106 mmol/L (ref 98–111)
Creatinine, Ser: 0.95 mg/dL (ref 0.44–1.00)
GFR, Estimated: >60 mL/min (ref >60)
Glucose, Bld: 104 mg/dL — ABNORMAL HIGH (ref 70–99)
Potassium: 3.3 mmol/L — ABNORMAL LOW (ref 3.5–5.1)
Sodium: 141 mmol/L (ref 135–145)

## 2023-05-02 NOTE — ED Provider Notes (Signed)
[facility] EMERGENCY DEPARTMENT AT [facility] Provider Note   CSN: [id number] Arrival date & time: 05/02/23  0424     History {Add pertinent medical, surgical, social history, OB history to HPI:1} Chief Complaint  Patient presents with   Hypertension    Emily [name] is a 72 y.o. female.   Hypertension       Home Medications Prior to Admission medications   Medication Sig Start Date End Date Taking? Authorizing Provider  Acetylcarnitine HCl (ACETYL L-CARNITINE) 250 MG CAPS Take by mouth. 200 mg every other day    [provider]  ALPRAZolam (XANAX) 0.25 MG tablet Take 1 tablet (0.25 mg total) by mouth 2 (two) times daily as needed for anxiety. 02/23/22   Etta Grandchild, MD  atorvastatin (LIPITOR) 80 MG tablet Take 1 tablet (80 mg total) by mouth daily. 07/28/22   Laurey Morale, MD  b complex vitamins tablet Take 1 tablet by mouth daily.    [provider]  Biotin 300 MCG TABS Take 600 mcg by mouth every other day.    [provider]  Calcium-Magnesium-Zinc 500-250-12.5 MG TABS Take 1 tablet by mouth every other day.    [provider]  candesartan (ATACAND) 8 MG tablet Take 1 tablet (8 mg total) by mouth 2 (two) times daily. NEEDS FOLLOW UP APPOINTMENT FOR MORE REFILLS 04/26/23   Laurey Morale, MD  Cholecalciferol (VITAMIN D) 50 MCG (2000 UT) CAPS Take 2,000 Units by mouth daily.    [provider]  Chromium Picolinate 200 MCG TABS Take 1 tablet by mouth every other day.    [provider]  clobetasol ointment (TEMOVATE) 0.05 % as needed. 09/27/20   [provider]  clopidogrel (PLAVIX) 75 MG tablet Take 1 tablet (75 mg total) by mouth daily. 07/28/22   Laurey Morale, MD  Coenzyme Q10 (COQ10) 200 MG CAPS Take 1 capsule by mouth daily.    [provider]  CRANBERRY JUICE EXTRACT PO Take 500 mg by mouth daily.    [provider]  EPINEPHrine 0.3 mg/0.3 mL IJ SOAJ injection INJECT  0.3 MLS INTO THE MUSCLE ONCE FOR 1 DOSE 09/03/17   [provider]  famotidine (PEPCID AC) 10 MG tablet Take 1 tablet (10 mg total) by mouth 2 (two) times daily. Take  1-2 tablets twice daily 03/19/18   Armbruster, Willaim Rayas, MD  finasteride (PROSCAR) 5 MG tablet Take 1 tablet (5 mg total) by mouth daily. 07/28/22   Laurey Morale, MD  Flaxseed, Linseed, (FLAX SEED OIL) 1000 MG CAPS Take 1,200 mg by mouth daily.    [provider]  Garlic Oil 500 MG TABS Take 1 tablet by mouth daily.    [provider]  hydrALAZINE (APRESOLINE) 25 MG tablet Take 1 tablet by mouth up to three times daily for systolic BP > 160 05/01/23   Runell Gess, MD  metoprolol succinate (TOPROL-XL) 50 MG 24 hr tablet Take 1 tablet (50 mg total) by mouth daily. 07/28/22   Laurey Morale, MD  nitroGLYCERIN (NITROSTAT) 0.4 MG SL tablet Place 1 tablet (0.4 mg total) under the tongue every 5 (five) minutes x 3 doses as needed for chest pain. 07/28/22   Laurey Morale, MD  omega-3 acid ethyl esters (LOVAZA) 1 g capsule TAKE 1 CAPSULE BY MOUTH EVERY DAY 02/15/23   Laurey Morale, MD  OVER THE COUNTER MEDICATION Take 1,000 mg by mouth in the morning and at bedtime. Beetroot  1000 mg capsules    [provider]  Probiotic Product (ALIGN PO) Take 1 capsule by mouth daily.    [provider]  Rhodiola 300 MG CAPS Take 1 capsule by mouth daily.      [provider]  sucralfate (CARAFATE) 1 GM/10ML suspension Take 10 mLs (1 g total) by mouth every 8 (eight) hours as needed. 10/21/20   Armbruster, Willaim Rayas, MD  triamcinolone (NASACORT) 55 MCG/ACT AERO nasal inhaler     [provider]  vitamin C (ASCORBIC ACID) 500 MG tablet Take 1,500 mg by mouth daily.    [provider]  vitamin E 180 MG (400 UNITS) capsule Take 400 Units by mouth every other day.    [provider]      Allergies    Benzonatate, Chlorpromazine hcl, Codeine, Doxycycline, Hydrocodone,  Hydrocodone-acetaminophen, Lisinopril, Lovaza [omega-3-acid ethyl esters], Penicillins, Prochlorperazine edisylate, Promethazine hcl, and Sulfonamide derivatives    Review of Systems   Review of Systems  Physical Exam Updated Vital Signs BP (!) 182/75 (BP Location: Left Arm)   Pulse 70   Temp 98.1 F (36.7 C) (Oral)   Resp 16   SpO2 96%  Physical Exam  ED Results / Procedures / Treatments   Labs (all labs ordered are listed, but only abnormal results are displayed) Labs Reviewed  BASIC METABOLIC PANEL - Abnormal; Notable for the following components:      Result Value   Potassium 3.3 (*)    Glucose, Bld 104 (*)    Calcium 8.8 (*)    All other components within normal limits  CBC WITH DIFFERENTIAL/PLATELET    EKG None  Radiology No results found.  Procedures Procedures  {Document cardiac monitor, telemetry assessment procedure when appropriate:1}  Medications Ordered in ED Medications - No data to display  ED Course/ Medical Decision Making/ A&P   {   Click here for ABCD2, HEART and other calculatorsREFRESH Note before signing :1}                              Medical Decision Making Amount and/or Complexity of Data Reviewed Labs: ordered.   ***  {Document critical care time when appropriate:1} {Document review of labs and clinical decision tools ie heart score, Chads2Vasc2 etc:1}  {Document your independent review of radiology images, and any outside records:1} {Document your discussion with family members, caretakers, and with consultants:1} {Document social determinants of health affecting pt's care:1} {Document your decision making why or why not admission, treatments were needed:1} Final Clinical Impression(s) / ED Diagnoses Final diagnoses:  Hypertension, unspecified type  Hypokalemia    Rx / DC Orders ED Discharge Orders     None

## 2023-05-02 NOTE — ED Triage Notes (Signed)
Pt BIB [facility] from home with c/o hypertension since 2200 last night. Pt recently changed BP medications and took first dose at 2200 last night of 25 mg hydralazine. Pt states her doctor told her to take the hydralazine when SBP is greater than 160. Denies chest pain or shortness of breath. Pt made a statement to EMS about needing a xanax.  EMS Vitals: BP 200/94 HR 104 RR 18 97% room air

## 2023-05-02 NOTE — Telephone Encounter (Signed)
Patient called, went to ED early this morning because BP hit 200 systolic.  Had taken doses of hydralazine at 10 pm and 3 am, but didn't notice a drop in BP.  She is frustrated with lack of BP control.    Asked that she take the hydralazine 25 mg tid, with each dose of candesartan (takes 8 mg tid) EVERY DAY, regardless of BP, and keep appointment for Dec 5.    Assured her that if she can use her nasacot spray to help with allergy symptoms, and use acetaminophen for allergy related headaches.     She should go to ED if BP > 180 systolic with new or unusual symptoms.    Patient voiced understanding

## 2023-05-02 NOTE — Discharge Instructions (Signed)
HAPPY BIRTHDAY!!  I know it can be difficult, but try to remember the things I talked to you tonight about your blood pressure readings, sleep, time of day, anxiety and what symptoms to look out for that require repeat ER evaluation.  In the meantime, discuss with the hypertension clinic on next best steps.  Don't worry too much about your potassium at this point. Just try to increase the foods that have a higher amount and ensure you have it rechecked in 1-2 weeks to make sure it is not worsening.

## 2023-05-03 ENCOUNTER — Other Ambulatory Visit (HOSPITAL_COMMUNITY): Payer: Self-pay | Admitting: Cardiology

## 2023-05-04 ENCOUNTER — Telehealth: Payer: Self-pay | Admitting: Pharmacist Clinician (PhC)/ Clinical Pharmacy Specialist

## 2023-05-04 MED ORDER — SPIRONOLACTONE 25 MG PO TABS: 25.0000 mg | ORAL_TABLET | Freq: Every day | ORAL | 3 refills | Status: DC

## 2023-05-04 MED ORDER — CANDESARTAN CILEXETIL 16 MG PO TABS: 16.0000 mg | ORAL_TABLET | Freq: Two times a day (BID) | ORAL | 3 refills | Status: DC

## 2023-05-04 NOTE — Telephone Encounter (Signed)
Patient took hydralazine 25 mg tid x 2 days and noted flushing and visual problems.  Was not seeing a drop in BP, but probably did not have enough doses.   Spoke with husband today, he states readings still going close to 200 systolic close to 3 am.  (She usually sleeps from about 4-10 am).    She has always been hesitant to take more than 8 mg candesartan per dose.  Explained to husband that she is going to have to take 16 mg bid, as we need to get her on maximum dose.  Will also add spironolactone 25 mg once daily at 3 pm.  (Meds are usually 3 am, 3 pm and 10 pm).    Explained will take 4-5 days for blood pressure readings to drop.  She should use her alprazolam for anxiety associated with elevated pressure.  Husband voiced understanding.

## 2023-05-08 ENCOUNTER — Ambulatory Visit: Payer: Medicare HMO | Attending: Physician Assistant | Admitting: Emergency Medicine

## 2023-05-08 ENCOUNTER — Encounter: Payer: Self-pay | Admitting: Physician Assistant

## 2023-05-08 VITALS — BP 122/66 | HR 63 | Ht 61.0 in | Wt 148.0 lb

## 2023-05-08 DIAGNOSIS — E782 Mixed hyperlipidemia: Secondary | ICD-10-CM

## 2023-05-08 DIAGNOSIS — I10 Essential (primary) hypertension: Secondary | ICD-10-CM | POA: Diagnosis not present

## 2023-05-08 DIAGNOSIS — I351 Nonrheumatic aortic (valve) insufficiency: Secondary | ICD-10-CM

## 2023-05-08 DIAGNOSIS — I2510 Atherosclerotic heart disease of native coronary artery without angina pectoris: Secondary | ICD-10-CM

## 2023-05-08 DIAGNOSIS — I701 Atherosclerosis of renal artery: Secondary | ICD-10-CM

## 2023-05-08 DIAGNOSIS — I350 Nonrheumatic aortic (valve) stenosis: Secondary | ICD-10-CM

## 2023-05-08 NOTE — Progress Notes (Signed)
Cardiology Office Note:    Date:  05/08/2023  ID:  Emily [name], DOB May 13, 1951, MRN [id number] PCP: Pcp, No  [facility] Providers Cardiologist:  None Advanced Heart Failure:  Emily Ancona, MD       Patient Profile:      72 year old female with past medical history of former tobacco use, hypertension, hyperlipidemia, CAD s/p inferior MI with PCI to OM 3 branch stenting in 2009.  06/2007 had stenting of OM 3 with Dr. [name].  Echo 02/2015 showed EF 55-60% with mild aortic insufficiency.  Carotid Doppler 02/18/2020, right and left carotid artery consistent with 1-39% stenosis.  In 2021 she was referred to advanced hypertension clinic for labile blood pressure readings.  Renal Doppler studies performed 02/03/2020 revealed symmetric renal dimensions with suggestion of left renal artery stenosis with a left renal aortic ratio 4.42, 1-59% to the right renal artery, and greater than 60% in left.  Dr. Gery Pray decided to follow this conservatively. A follow up CT suggested beaded configuration compatible with fibromuscular dysplasia with 50-60% left blockage. Repeat renal artery Dopplers in 01/2021 again showed greater than 60% left renal artery stenosis, but BP is not significantly improved.  It was again to treat this conservatively.  Echo 09/2021 with EF 55-60%, normal RV, mild/moderate AS with mean gradient 10 mmHg, mild AI.  Echo September 11, 2022 with EF 60-65%, no RWMA, grade 1 DD, RV SF normal, mild aortic valve regurgitation, mild to moderate aortic valve stenosis  She was last seen on 04/13/2023 in the clinic by Baxter Hire, [facility] as of that day she had been taking candesartan 8 mg tid (3 pm, 10 pm and 3 am) and metoprolol succ 50 mg daily. She routinely checks BP 4 times per day.  She notes her home readings were WNL with the exception of 3PM which can be 30 points higher, this causes her anxiety keeping her from going to sleep.  The plan was to start taking metoprolol succinate 25 mg twice  daily (3 PM, 3 AM).   On 05/01/2023 patient called office for blood pressures greater than 180 systolic, hydralazine 25 mg TID as needed for SBP greater than 160 was added.  On 05/02/2023 she again called office noting blood pressures 200 systolic.  She was told to take hydralazine 25 mg 3 times daily instead of as needed.  Of note she was seen in the ED in the early morning of 05/02/2023 for hypertension and discharged home without any intervention.  On 05/04/2023 Wynona Canes, [facility] increased/switched her candesartan dose to 16mg  twice daily to get her on the maximum dose. Spironolactone 25 mg once daily was also added for her to take at 3 PM.  She was instructed to use her olanzapine for anxiety associated with elevated blood pressures.       History of Present Illness:   Emily [name] is a 72 y.o. female who returns for ED follow-up for hypertension.  Today, she notes that she has been frustrated with her blood pressures rising around 3 AM at night.  She had recently added on spironolactone last week to her 3 PM dosage. She notes that she usually does not get to bed until 4 AM, she does note that she gets at least 5 hours of sleep nightly.  She notes that sometimes around 3 AM her blood pressures will get up to 180 and sometimes close to 200.  However, her blood pressures have been stable and around 120s to 130s throughout the daytime.  She tells me  that she takes her blood pressure at least 4 times a day and she will continue to take her blood pressure at 3 AM until she can get it under better control.  She denies any associated symptoms to her high blood pressure levels.  She does not experience headaches, syncope, chest pain, shortness of breath during times that her blood pressure is elevated.  She notes that her face does get flushed at times.  She notes that she has had Xanax in the past when her blood pressure gets over 150 which she has found helps lowers her anxiety and blood pressure.             Review of Systems  Constitutional: Negative for weight gain and weight loss.  Cardiovascular:  Negative for chest pain, claudication, dyspnea on exertion, irregular heartbeat, leg swelling, near-syncope, orthopnea, palpitations, paroxysmal nocturnal dyspnea and syncope.  Respiratory:  Negative for cough, hemoptysis and shortness of breath.   Gastrointestinal:  Negative for hematochezia.  Genitourinary:  Negative for hematuria.  Neurological:  Negative for dizziness and light-headedness.     See HPI     Studies Reviewed:   EKG Interpretation Date/Time:  Tuesday May 08 2023 14:07:26 EST Ventricular Rate:  63 PR Interval:  172 QRS Duration:  84 QT Interval:  414 QTC Calculation: 423 R Axis:   38  Text Interpretation: Normal sinus rhythm Nonspecific T wave abnormality Confirmed by Rise Paganini [phone]) on 05/08/2023 2:56:00 PM    Echo 10/12/2022 1. Left ventricular ejection fraction, by estimation, is 60 to 65%. The  left ventricle has normal function. The left ventricle has no regional  wall motion abnormalities. Left ventricular diastolic parameters are  consistent with Grade I diastolic  dysfunction (impaired relaxation).   2. Right ventricular systolic function is normal. The right ventricular  size is normal. There is normal pulmonary artery systolic pressure. The  estimated right ventricular systolic pressure is 24.0 mmHg.   3. The mitral valve is grossly normal. No evidence of mitral valve  regurgitation.   4. There is moderate calcification of the aortic valve. Aortic valve  regurgitation is mild. Mild to moderate aortic valve stenosis.   5. The inferior vena cava is normal in size with greater than 50%  respiratory variability, suggesting right atrial pressure of 3 mmHg.   VAS Renal US 02/16/2021 Right: 1-59% stenosis of the right renal artery. RRV flow present.         Normal size right kidney. Normal right Resisitive Index.         Normal cortical  thickness of right kidney.  Left:  Evidence of a > 60% stenosis in the left renal artery. LRV         flow present. Normal size of left kidney. Normal left         Resistive Index. Normal cortical thickness of the left         kidney.         No significant change when compared to previous exam.   VAS Carotid US 02/18/2020 Right Carotid: Velocities in the right ICA are consistent with a 1-39%  stenosis.   Left Carotid: Velocities in the left ICA are consistent with a 1-39%  stenosis.   VAS Renal US 02/03/2020 Right: Normal size right kidney. Abnormal right Resistive Index.         Abnormal cortical thickness of right kidney. 1-59% stenosis         of the right renal artery. RRV flow present.  Left:  Normal size of left kidney. Abnormal left Resistive Index.         Abnormal cortical thickness of the left kidney. Evidence of a         > 60% stenosis in the left renal artery. LRV flow present.  Risk Assessment/Calculations:             Physical Exam:   VS:  BP 122/66 (BP Location: Left Arm, Patient Position: Sitting, Cuff Size: Normal)   Pulse 63   Ht 5\' 1"  (1.549 m)   Wt 148 lb (67.1 kg)   BMI 27.96 kg/m    Wt Readings from Last 3 Encounters:  05/08/23 148 lb (67.1 kg)  04/13/23 146 lb 6.4 oz (66.4 kg)  04/05/23 146 lb 11.2 oz (66.5 kg)    Constitutional:      Appearance: Normal and healthy appearance.  HENT:     Head: Normocephalic.  Neck:     Vascular: JVD normal.  Pulmonary:     Effort: Pulmonary effort is normal.     Breath sounds: Normal breath sounds.  Chest:     Chest wall: Not tender to palpatation.  Cardiovascular:     PMI at left midclavicular line. Normal rate. Regular rhythm. Normal S1. Normal S2.      Murmurs: There is a harsh midsystolic murmur at the URSB.     No gallop.  No click. No rub.  Pulses:    Intact distal pulses.  Edema:    Peripheral edema absent.  Musculoskeletal: Normal range of motion.     Cervical back: Normal range of motion and neck  supple. Skin:    General: Skin is warm and dry.  Neurological:     General: No focal deficit present.     Mental Status: Alert, oriented to person, place, and time and oriented to person, place and time.  Psychiatric:        Attention and Perception: Attention and perception normal.        Mood and Affect: Mood normal.        Behavior: Behavior is cooperative.        Thought Content: Thought content normal.        Assessment and Plan:  Coronary Artery Disease -PCI in 06/2007 in setting of inferior MI, residual untreated 50-70% mLAD stenosis -EKG today normal sinus rhythm without any acute ST-T wave changes -She denies any exertional angina, no ischemic evaluation indicated -Continue clopidogrel 75mg , no bleeding concerns   Hypertension / RAS -Hx RAS currently treated conservatively. Renal US 01/2021: Evidence of a > 60% stenosis in the left renal artery. -Blood pressure today 122/66.  Her blood pressures logs from home showed well-controlled blood pressures (120s-130s) during the daytime and early nighttime hours. We find that her blood pressure tends to escalate around 3 AM....she usually sleeps from about 4 to 10 AM.  She is taking her blood pressure 4-6 times daily. -This past week: Spironolactone 25mg  added and candesartan increased from 8 mg TID to 16 mg BID by PharmD -I will have her continue candesartan 16 mg twice daily, metoprolol succinate 25mg  twice daily (3pm/3am), spironolactone 25 mg once daily (3pm) -She we will take alprazolam as needed for hypertension related anxiety -She will work on healthy sleep habits, low salt dieting, and reducing her anxiety -Plan to repeat renal ultrasound to reassess RAS. BMP x1 week.    Hyperlipidemia, LDL goal <55 -LDL 78 on 09/22/2021 -Has not wanted to try zetia in past, intolerant to crestor -  Continue Atorvastatin 80mg  -May be a good PCSK9 candidate  Aortic insufficiency/stenosis -Echo 09/2022 with mild AI and mild-moderate  AS -Currently asymptomatic, repeat 10/2022                  Dispo:  F/U with PharmD on 05/24/2023  Signed, Denyce Robert, NP

## 2023-05-08 NOTE — Patient Instructions (Addendum)
Medication Instructions:  NO CHANGES *If you need a refill on your cardiac medications before your next appointment, please call your pharmacy*   Lab Work: BMP IN 1 WEEK If you have labs (blood work) drawn today and your tests are completely normal, you will receive your results only by: MyChart Message (if you have MyChart) OR A paper copy in the mail If you have any lab test that is abnormal or we need to change your treatment, we will call you to review the results.   Testing/Procedures:[address] Your physician has requested that you have a renal artery duplex. During this test, an ultrasound is used to evaluate blood flow to the kidneys. Allow one hour for this exam. Do not eat after midnight the day before and avoid carbonated beverages. Take your medications as you usually do.    Follow-Up: At [facility], you and your health needs are our priority.  As part of our continuing mission to provide you with exceptional heart care, we have created designated Provider Care Teams.  These Care Teams include your primary Cardiologist (physician) and Advanced Practice Providers (APPs -  Physician Assistants and Nurse Practitioners) who all work together to provide you with the care you need, when you need it.   Your next appointment:   KEEP UPCOMING APPOINTMENT MARCH 2025  Provider:   Nanetta Batty, MD

## 2023-05-09 ENCOUNTER — Ambulatory Visit (INDEPENDENT_AMBULATORY_CARE_PROVIDER_SITE_OTHER): Payer: Medicare HMO

## 2023-05-09 ENCOUNTER — Ambulatory Visit: Payer: Medicare HMO | Admitting: Podiatry

## 2023-05-09 DIAGNOSIS — D1723 Benign lipomatous neoplasm of skin and subcutaneous tissue of right leg: Secondary | ICD-10-CM | POA: Diagnosis not present

## 2023-05-09 DIAGNOSIS — R600 Localized edema: Secondary | ICD-10-CM

## 2023-05-09 DIAGNOSIS — Z79899 Other long term (current) drug therapy: Secondary | ICD-10-CM

## 2023-05-09 DIAGNOSIS — B351 Tinea unguium: Secondary | ICD-10-CM

## 2023-05-09 MED ORDER — TERBINAFINE HCL 250 MG PO TABS: 250.0000 mg | ORAL_TABLET | Freq: Every day | ORAL | 0 refills | Status: DC

## 2023-05-09 NOTE — Progress Notes (Signed)
Chief Complaint  Patient presents with   Foot Pain    Patient is here for bilateral foot swelling and pain, numb toes, and nail fungus    Subjective: 72 y.o. female presenting today as a new patient with her husband for evaluation of thick discoloration to the toenails bilateral.  Onset for several years.  Patient states that she has tried topical antifungal medication with no improvement.  About 20+ years ago she also tried oral antifungal medication which created nausea and vomiting.  Patient also states that she is developed a 'lump' to the anterior lateral aspect of the right ankle.  It is minimally symptomatic but she would like to have it evaluated.  Past Medical History:  Diagnosis Date   Allergy    Anxiety    no anxiety per pt- she wants removed    CAD (coronary artery disease)    Cataract    forming    Chronic insomnia    Colon polyp    Depression    GERD (gastroesophageal reflux disease)    GERD with esophagitis    Heart attack (HCC) 06/2007   Heart murmur    Hyperlipemia    Hypertension    Osteopenia    Perirectal abscess    Shingles 08/31/2017   2nd dx with shingles, per pt- shingles x 4 - severe rxn to shingles vax - last shingles 05-2021    Past Surgical History:  Procedure Laterality Date   CESAREAN SECTION     x 2   COLONOSCOPY     COLONOSCOPY W/ POLYPECTOMY  2007   INCISION AND DRAINAGE ABSCESS ANAL     POLYPECTOMY     PTCA with stent to third obtuse marginal     WISDOM TOOTH EXTRACTION      Allergies  Allergen Reactions   Benzonatate     Unknown, patient states "it made me sick"   Chlorpromazine Hcl     unknown   Codeine     REACTION: causes vomiting   Doxycycline     "explosive diarrhea" -does not wish to take this anymore   Hydrocodone     REACTION: causes vomiing and nausea   Hydrocodone-Acetaminophen Nausea And Vomiting   Lisinopril Cough    Patient states that she will no longer take this medication because it gave her a bad cough     Lovaza [Omega-3-Acid Ethyl Esters (Fish)] Other (See Comments)    Loose stools with two capsules- can take 1 capsule with no issues    Penicillins     REACTION: causes rash   Prochlorperazine Edisylate     unknown    Promethazine Hcl     unknown   Sulfonamide Derivatives     unknown     B/L feet 05/09/2023  Objective: Physical Exam General: The patient is alert and oriented x3 in no acute distress.  Dermatology: Hyperkeratotic, discolored, thickened, onychodystrophy noted. Skin is warm, dry and supple bilateral lower extremities. Negative for open lesions or macerations.  Vascular: Palpable pedal pulses bilaterally. No edema or erythema noted. Capillary refill within normal limits.  Neurological: Grossly intact via light touch  Musculoskeletal Exam: No pedal deformity noted.  Postmenopausal lipoma noted to the lateral aspect of the right anterior ankle  Assessment: #1 Onychomycosis of toenails bilateral #2 lipoma right anterior lateral ankle  Plan of Care:  -Patient was evaluated. -Recommend simply observing the lipoma to the right ankle.  Currently minimally symptomatic -Today we discussed different treatment options including oral, topical, and laser  antifungal treatment modalities.  We discussed their efficacies and side effects.  Patient opts for oral antifungal treatment modality -Prescription for Lamisil 250 mg #90 daily. Pt denies a history of liver pathology or symptoms.  Patient is otherwise healthy Order placed for updated hepatic function panel.  Discontinue if abnormal -Return to clinic 6 months   Felecia Shelling, DPM [facility]  Dr. Felecia Shelling, DPM    [address] [place], [place] [address]                Office [phone]  Fax [phone]

## 2023-05-10 LAB — HEPATIC FUNCTION PANEL
AG Ratio: 2.1 (calc) (ref 1.0–2.5)
ALT: 18 U/L (ref 6–29)
AST: 21 U/L (ref 10–35)
Albumin: 4.5 g/dL (ref 3.6–5.1)
Alkaline phosphatase (APISO): 81 U/L (ref 37–153)
Bilirubin, Direct: 0.1 mg/dL (ref 0.0–0.2)
Globulin: 2.1 g/dL (ref 1.9–3.7)
Indirect Bilirubin: 0.7 mg/dL (ref 0.2–1.2)
Total Bilirubin: 0.8 mg/dL (ref 0.2–1.2)
Total Protein: 6.6 g/dL (ref 6.1–8.1)

## 2023-05-22 LAB — BASIC METABOLIC PANEL
BUN/Creatinine Ratio: 24 (ref 12–28)
BUN: 19 mg/dL (ref 8–27)
CO2: 22 mmol/L (ref 20–29)
Calcium: 9.7 mg/dL (ref 8.7–10.3)
Chloride: 102 mmol/L (ref 96–106)
Creatinine, Ser: 0.8 mg/dL (ref 0.57–1.00)
Glucose: 106 mg/dL — ABNORMAL HIGH (ref 70–99)
Potassium: 4.2 mmol/L (ref 3.5–5.2)
Sodium: 142 mmol/L (ref 134–144)
eGFR: 78 mL/min/{1.73_m2} (ref >59)

## 2023-05-24 ENCOUNTER — Telehealth: Payer: Self-pay

## 2023-05-24 ENCOUNTER — Ambulatory Visit: Payer: Medicare HMO | Attending: Internal Medicine | Admitting: Pharmacist Clinician (PhC)/ Clinical Pharmacy Specialist

## 2023-05-24 ENCOUNTER — Encounter: Payer: Self-pay | Admitting: Pharmacist Clinician (PhC)/ Clinical Pharmacy Specialist

## 2023-05-24 VITALS — BP 91/53 | HR 56

## 2023-05-24 DIAGNOSIS — I10 Essential (primary) hypertension: Secondary | ICD-10-CM | POA: Diagnosis not present

## 2023-05-24 NOTE — Telephone Encounter (Signed)
Patient called and left a message on 05/23/23. She is a little upset. A friend told her that if she is taking terbinafine she cannot drink alcohol at all. This was not discussed at her office visit. If this true, she would like to wait until after the holidays to start the medication. Please call to advise Thanks

## 2023-05-24 NOTE — Progress Notes (Signed)
05/25/2023 Emily [name] [birth year] [id number]   HPI:  Emily [name] is a 72 y.o. female patient of Dr Allyson Sabal, with a PMH below who presents today for hypertension clinic evaluation.  Back in 2021 she was referred to the advanced hypertension clinic for labile blood pressure readings, and it was noted that stress affected her readings.  In doing secondary workup, renal dopplers showed 1-59% blockage of right renal artery and > 60% in left.  A follow up CT suggested beaded configuration compatible with fibromuscular dysplasia with 50-60% left blockage.  She has been on metoprolol succ and candesartan for a few years now and tolerates both without issue.  She felt that her pressure was controlled until she had the shingles vaccine, but again we managed to get her back under control and she stopped checking home pressures (was checking up to 6 times/day).    I last saw her in April, at which time she was doing well on candesartan and metoprolol.   Since then she has reached out a few times, and as of today has been taking candesartan 8 mg tid (3 pm, 10 pm and 3 am) and metoprolol succ 50 mg daily.  She routinely checks BP 4 times per day, with the "last" being around 3 am before she goes to bed.  While daytime readings had mostly been WNL, this was usually elevated, prompting her to add in the 10 pm dose of candesartan.    She sent a message in mid-November, had some BP readings that were elevated and went to ED.  Had been treated with cipro for UTI and noted that this often caused her pressure to increase.  Over the course of a week she called EMS on at least 2 occasions, although did not wish to go to ED after the first visit.   We increased her candesartan to 16 mg bid, and added spironolactone 25 mg daily.   Since then she was seen by Rise Paganini NP here, with a pressure of 122/66.  Home readings have been varied, but mostly < 130 systolic, often in the 110-119 range.    Today she is  in the office for follow up. Home readings have been much improved, with only a few readings over 140 and more in the 110-120 systolic range.  She is doing well with the current medication dosing and feeling better about her pressure.    Past Medical History: Carotid stenosis 9/21 1-39% stenosis bilaterally  hyperlipidemia 4/23 LDL 79 on atorvastatin 80  DM2 6/22 A1c 6.3, down from 6.7 (2017)  Fibromuscular dysplasia With 60% stenosis in Left renal artery    Blood Pressure Goal:  130/80  Current Medications: candesartan 16 mg (3 pm, 3 am);  metoprolol succ 25 mg bid, spironolactone 25 qd  Family Hx: father died at 49 (last year) ckd, GI bowel problems, mom at 76 from brain hemorrhage, had enlarged heart 2/2 hypertension (only weighed ~100 lb); 2 children in 60's healthy  Social Hx: no tobacco, quit in 08; occasional alcohol - maybe once a month at most; no coffee, 6 oz coke in the am; otherwise water.  Hot tea if feeling sick  Diet: mix of home and restaurant; does use some salt (light); salad and meat most nights; peanut butter crackers;  More carbs Fri-Sun  Exercise: no regular exercise, hoping to join silver sneakers soon - will start once Gyn cleared   Home BP readings: continues to check multiple times per day.  Range 115-150,  with average in low 130s  Intolerances: ACEI - cough, omega 3 oils - diarrhea in larger doses  Labs: 4/23: Na 137, K 3.7, Glu 137, BUN 12, SCr 0.54, GFR > 60   Wt Readings from Last 3 Encounters:  05/08/23 148 lb (67.1 kg)  04/13/23 146 lb 6.4 oz (66.4 kg)  04/05/23 146 lb 11.2 oz (66.5 kg)   BP Readings from Last 3 Encounters:  05/24/23 (!) 91/53  05/08/23 122/66  05/02/23 (!) 154/62   Pulse Readings from Last 3 Encounters:  05/24/23 (!) 56  05/08/23 63  05/02/23 (!) 55    Current Outpatient Medications  Medication Sig Dispense Refill   Acetylcarnitine HCl (ACETYL L-CARNITINE) 250 MG CAPS Take by mouth. 200 mg every other day     ALPRAZolam  (XANAX) 0.25 MG tablet Take 1 tablet (0.25 mg total) by mouth 2 (two) times daily as needed for anxiety. (Patient taking differently: Take 0.25 mg by mouth 2 (two) times daily as needed (For HBP if systolic is over 756.).) 180 tablet 1   atorvastatin (LIPITOR) 80 MG tablet Take 1 tablet (80 mg total) by mouth daily. 90 tablet 3   b complex vitamins tablet Take 1 tablet by mouth daily.     Biotin 300 MCG TABS Take 600 mcg by mouth every other day.     Calcium-Magnesium-Zinc 500-250-12.5 MG TABS Take 1 tablet by mouth every other day.     candesartan (ATACAND) 16 MG tablet Take 1 tablet (16 mg total) by mouth in the morning and at bedtime. 180 tablet 3   Cholecalciferol (VITAMIN D) 50 MCG (2000 UT) CAPS Take 2,000 Units by mouth daily.     Chromium Picolinate 200 MCG TABS Take 1 tablet by mouth every other day.     clobetasol ointment (TEMOVATE) 0.05 % as needed.     clopidogrel (PLAVIX) 75 MG tablet TAKE 1 TABLET BY MOUTH DAILY 90 tablet 3   Coenzyme Q10 (COQ10) 200 MG CAPS Take 1 capsule by mouth daily.     CRANBERRY JUICE EXTRACT PO Take 500 mg by mouth daily.     EPINEPHrine 0.3 mg/0.3 mL IJ SOAJ injection INJECT 0.3 MLS INTO THE MUSCLE ONCE FOR 1 DOSE  0   famotidine (PEPCID AC) 10 MG tablet Take 1 tablet (10 mg total) by mouth 2 (two) times daily. Take  1-2 tablets twice daily 180 tablet 3   finasteride (PROSCAR) 5 MG tablet Take 1 tablet (5 mg total) by mouth daily. 90 tablet 3   Flaxseed, Linseed, (FLAX SEED OIL) 1000 MG CAPS Take 1,200 mg by mouth daily.     Garlic Oil 500 MG TABS Take 1 tablet by mouth daily.     metoprolol succinate (TOPROL-XL) 50 MG 24 hr tablet Take 1 tablet (50 mg total) by mouth daily. 90 tablet 3   nitroGLYCERIN (NITROSTAT) 0.4 MG SL tablet Place 1 tablet (0.4 mg total) under the tongue every 5 (five) minutes x 3 doses as needed for chest pain. 25 tablet 3   omega-3 acid ethyl esters (LOVAZA) 1 g capsule TAKE 1 CAPSULE BY MOUTH EVERY DAY 90 capsule 0   OVER THE  COUNTER MEDICATION Take 1,000 mg by mouth in the morning and at bedtime. Beetroot 1000 mg capsules     Probiotic Product (ALIGN PO) Take 1 capsule by mouth daily.     Rhodiola 300 MG CAPS Take 1 capsule by mouth daily.       spironolactone (ALDACTONE) 25 MG tablet Take 1  tablet (25 mg total) by mouth daily. 90 tablet 3   sucralfate (CARAFATE) 1 GM/10ML suspension Take 10 mLs (1 g total) by mouth every 8 (eight) hours as needed. 420 mL 1   terbinafine (LAMISIL) 250 MG tablet Take 1 tablet (250 mg total) by mouth daily. 90 tablet 0   triamcinolone (NASACORT) 55 MCG/ACT AERO nasal inhaler      vitamin C (ASCORBIC ACID) 500 MG tablet Take 1,500 mg by mouth daily.     vitamin E 180 MG (400 UNITS) capsule Take 400 Units by mouth every other day.     No current facility-administered medications for this visit.    Allergies  Allergen Reactions   Benzonatate     Unknown, patient states "it made me sick"   Chlorpromazine Hcl     unknown   Codeine     REACTION: causes vomiting   Doxycycline     "explosive diarrhea" -does not wish to take this anymore   Hydrocodone     REACTION: causes vomiing and nausea   Hydrocodone-Acetaminophen Nausea And Vomiting   Lisinopril Cough    Patient states that she will no longer take this medication because it gave her a bad cough    Lovaza [Omega-3-Acid Ethyl Esters (Fish)] Other (See Comments)    Loose stools with two capsules- can take 1 capsule with no issues    Penicillins     REACTION: causes rash   Prochlorperazine Edisylate     unknown    Promethazine Hcl     unknown   Sulfonamide Derivatives     unknown     Past Medical History:  Diagnosis Date   Allergy    Anxiety    no anxiety per pt- she wants removed    CAD (coronary artery disease)    Cataract    forming    Chronic insomnia    Colon polyp    Depression    GERD (gastroesophageal reflux disease)    GERD with esophagitis    Heart attack (HCC) 06/2007   Heart murmur     Hyperlipemia    Hypertension    Osteopenia    Perirectal abscess    Shingles 08/31/2017   2nd dx with shingles, per pt- shingles x 4 - severe rxn to shingles vax - last shingles 05-2021    Blood pressure (!) 91/53, pulse (!) 56.  Essential hypertension Assessment: BP is controlled in office BP 91/53 mmHg;   No symptoms of hypotension - but does admit to only drinking 1/2 can of Coke today Tolerates current medications well without any side effects Denies SOB, palpitation, chest pain, headaches,or swelling Reiterated the importance of regular exercise and low salt diet   Plan:  Continue taking candesartan 16 mg bid, metoprolol succ 25 mg bid, spironolactone 25 mg every day  Patient to keep record of BP readings with heart rate and report to [place] at the next visit Patient to follow up with PharmD in 2 months  Labs ordered today:  none    Phillips Hay PharmD CPP [facility] [address] [place], [place] [address] [phone]

## 2023-05-24 NOTE — Assessment & Plan Note (Signed)
Assessment: BP is controlled in office BP 91/53 mmHg;   No symptoms of hypotension - but does admit to only drinking 1/2 can of Coke today Tolerates current medications well without any side effects Denies SOB, palpitation, chest pain, headaches,or swelling Reiterated the importance of regular exercise and low salt diet   Plan:  Continue taking candesartan 16 mg bid, metoprolol succ 25 mg bid, spironolactone 25 mg every day  Patient to keep record of BP readings with heart rate and report to [place] at the next visit Patient to follow up with PharmD in 2 months  Labs ordered today:  none

## 2023-05-24 NOTE — Telephone Encounter (Signed)
Tried calling patient. No answer. Left voicemail. She can drink socially over the holidays in moderation. If she is planning to excessively drink over the holidays then recommend beginning lamisil after the holidays are over. -Dr. Logan Bores

## 2023-05-24 NOTE — Patient Instructions (Signed)
Follow up appointment: Thursday February 6 at 3:30 pm  Take your BP meds as follows:  Continue with candesartan 16 mg twice daily, metoprolol succ 25 mg twice daily and spironolactone 25 mg once daily   Check your blood pressure at home daily (if able) and keep record of the readings.  Your blood pressure goal is < 130/80  To check your pressure at home you will need to:  1. Sit up in a chair, with feet flat on the floor and back supported. Do not cross your ankles or legs. 2. Rest your left arm so that the cuff is about heart level. If the cuff goes on your upper arm,  then just relax the arm on the table, arm of the chair or your lap. If you have a wrist cuff, we  suggest relaxing your wrist against your chest (think of it as Pledging the Flag with the  wrong arm).  3. Place the cuff snugly around your arm, about 1 inch above the crook of your elbow. The  cords should be inside the groove of your elbow.  4. Sit quietly, with the cuff in place, for about 5 minutes. After that 5 minutes press the power  button to start a reading. 5. Do not talk or move while the reading is taking place.  6. Record your readings on a sheet of paper. Although most cuffs have a memory, it is often  easier to see a pattern developing when the numbers are all in front of you.  7. You can repeat the reading after 1-3 minutes if it is recommended  Make sure your bladder is empty and you have not had caffeine or tobacco within the last 30 min  Always bring your blood pressure log with you to your appointments. If you have not brought your monitor in to be double checked for accuracy, please bring it to your next appointment.  You can find a list of quality blood pressure cuffs at [email or web address]  Important lifestyle changes to control high blood pressure  Intervention  Effect on the BP  Lose extra pounds and watch your waistline Weight loss is one of the most effective lifestyle changes for controlling  blood pressure. If you're overweight or obese, losing even a small amount of weight can help reduce blood pressure. Blood pressure might go down by about 1 millimeter of mercury (mm Hg) with each kilogram (about 2.2 pounds) of weight lost.  Exercise regularly As a general goal, aim for at least 30 minutes of moderate physical activity every day. Regular physical activity can lower high blood pressure by about 5 to 8 mm Hg.  Eat a healthy diet Eating a diet rich in whole grains, fruits, vegetables, and low-fat dairy products and low in saturated fat and cholesterol. A healthy diet can lower high blood pressure by up to 11 mm Hg.  Reduce salt (sodium) in your diet Even a small reduction of sodium in the diet can improve heart health and reduce high blood pressure by about 5 to 6 mm Hg.  Limit alcohol One drink equals 12 ounces of beer, 5 ounces of wine, or 1.5 ounces of 80-proof liquor.  Limiting alcohol to less than one drink a day for women or two drinks a day for men can help lower blood pressure by about 4 mm Hg.   If you have any questions or concerns please use My Chart to send questions or call the office at [phone]

## 2023-05-25 ENCOUNTER — Other Ambulatory Visit: Payer: Self-pay | Admitting: Podiatry

## 2023-05-31 ENCOUNTER — Ambulatory Visit (HOSPITAL_COMMUNITY)
Admission: RE | Admit: 2023-05-31 | Discharge: 2023-05-31 | Disposition: A | Discharge status: Home / Self Care | Payer: Medicare HMO | Source: Ambulatory Visit | Attending: Emergency Medicine | Admitting: Emergency Medicine

## 2023-05-31 DIAGNOSIS — I701 Atherosclerosis of renal artery: Secondary | ICD-10-CM

## 2023-07-24 ENCOUNTER — Other Ambulatory Visit (HOSPITAL_COMMUNITY): Payer: Self-pay

## 2023-07-24 ENCOUNTER — Telehealth: Payer: Self-pay | Admitting: Pharmacy Technician

## 2023-07-24 NOTE — Telephone Encounter (Signed)
Pharmacy Patient Advocate Encounter  Received notification from [facility] that Prior Authorization for omega-3 has been DENIED.  Full denial letter will be uploaded to the media tab. See denial reason below.   PA #/Case ID/Reference #: [id number]

## 2023-07-24 NOTE — Telephone Encounter (Signed)
 Pharmacy Patient Advocate Encounter   Received notification from CoverMyMeds that prior authorization for omega is required/requested.   Insurance verification completed.   The patient is insured through [facility] .   Per test claim: PA required; PA submitted to above mentioned insurance via CoverMyMeds Key/confirmation #/EOC [id number] Status is pending

## 2023-07-26 ENCOUNTER — Ambulatory Visit
Payer: Medicare HMO | Attending: Cardiovascular Disease | Admitting: Pharmacist Clinician (PhC)/ Clinical Pharmacy Specialist

## 2023-07-26 ENCOUNTER — Encounter: Payer: Self-pay | Admitting: Pharmacist Clinician (PhC)/ Clinical Pharmacy Specialist

## 2023-07-26 VITALS — BP 95/48 | HR 48 | Ht 61.0 in | Wt 147.0 lb

## 2023-07-26 DIAGNOSIS — E782 Mixed hyperlipidemia: Secondary | ICD-10-CM

## 2023-07-26 DIAGNOSIS — E785 Hyperlipidemia, unspecified: Secondary | ICD-10-CM

## 2023-07-26 DIAGNOSIS — I10 Essential (primary) hypertension: Secondary | ICD-10-CM

## 2023-07-26 NOTE — Assessment & Plan Note (Signed)
 Assessment: BP is controlled in office BP 95/48 mmHg;  Will bring home readings, with HR in the next day or two Tolerates candesartan , metoprolol  and spironolactone  well without any side effects Denies SOB, palpitation, chest pain, headaches,or swelling Reiterated the importance of regular exercise and low salt diet   Plan:  Continue taking candesartan  16 mg (3 pm, 3 am);  metoprolol  succ 25 mg bid, spironolactone  25 qd Patient to keep record of BP readings with heart rate and report to us  at the next visit Patient to follow up with Dr. Court in March  Labs ordered today:  none

## 2023-07-26 NOTE — Patient Instructions (Signed)
 Follow up appointment: with Dr. Court March 25th   Go to the lab in the next few days to check cholesterol and liver function  Take your BP meds as follows: no changes to your medications today.    Check your blood pressure at home daily (if able) and keep record of the readings.  Your blood pressure goal is < 130/80  To check your pressure at home you will need to:  1. Sit up in a chair, with feet flat on the floor and back supported. Do not cross your ankles or legs. 2. Rest your left arm so that the cuff is about heart level. If the cuff goes on your upper arm,  then just relax the arm on the table, arm of the chair or your lap. If you have a wrist cuff, we  suggest relaxing your wrist against your chest (think of it as Pledging the Flag with the  wrong arm).  3. Place the cuff snugly around your arm, about 1 inch above the crook of your elbow. The  cords should be inside the groove of your elbow.  4. Sit quietly, with the cuff in place, for about 5 minutes. After that 5 minutes press the power  button to start a reading. 5. Do not talk or move while the reading is taking place.  6. Record your readings on a sheet of paper. Although most cuffs have a memory, it is often  easier to see a pattern developing when the numbers are all in front of you.  7. You can repeat the reading after 1-3 minutes if it is recommended  Make sure your bladder is empty and you have not had caffeine or tobacco within the last 30 min  Always bring your blood pressure log with you to your appointments. If you have not brought your monitor in to be double checked for accuracy, please bring it to your next appointment.  You can find a list of quality blood pressure cuffs at [email or web address]  Important lifestyle changes to control high blood pressure  Intervention  Effect on the BP  Lose extra pounds and watch your waistline Weight loss is one of the most effective lifestyle changes for controlling blood  pressure. If you're overweight or obese, losing even a small amount of weight can help reduce blood pressure. Blood pressure might go down by about 1 millimeter of mercury (mm Hg) with each kilogram (about 2.2 pounds) of weight lost.  Exercise regularly As a general goal, aim for at least 30 minutes of moderate physical activity every day. Regular physical activity can lower high blood pressure by about 5 to 8 mm Hg.  Eat a healthy diet Eating a diet rich in whole grains, fruits, vegetables, and low-fat dairy products and low in saturated fat and cholesterol. A healthy diet can lower high blood pressure by up to 11 mm Hg.  Reduce salt (sodium) in your diet Even a small reduction of sodium in the diet can improve heart health and reduce high blood pressure by about 5 to 6 mm Hg.  Limit alcohol One drink equals 12 ounces of beer, 5 ounces of wine, or 1.5 ounces of 80-proof liquor.  Limiting alcohol to less than one drink a day for women or two drinks a day for men can help lower blood pressure by about 4 mm Hg.   If you have any questions or concerns please use My Chart to send questions or call the office at [phone]

## 2023-07-26 NOTE — Progress Notes (Signed)
 07/26/2023 Emily [name] 18-Apr-1951 [id number]   HPI:  Emily [name] is a 73 y.o. female patient of Dr Court, with a PMH below who presents today for hypertension clinic evaluation.  Back in 2021 she was referred to the advanced hypertension clinic for labile blood pressure readings, and it was noted that stress affected her readings.  In doing secondary workup, renal dopplers showed 1-59% blockage of right renal artery and > 60% in left.  A follow up CT suggested beaded configuration compatible with fibromuscular dysplasia with 50-60% left blockage.  She has been on metoprolol  succ and candesartan  for a few years now and tolerates both without issue.  She felt that her pressure was controlled until she had the shingles vaccine, but again we managed to get her back under control and she stopped checking home pressures (was checking up to 6 times/day).    I last saw her in April, at which time she was doing well on candesartan  and metoprolol .   Since then she has reached out a few times, and as of today has been taking candesartan  8 mg tid (3 pm, 10 pm and 3 am) and metoprolol  succ 50 mg daily.  She routinely checks BP 4 times per day, with the last being around 3 am before she goes to bed.  While daytime readings had mostly been WNL, this was usually elevated, prompting her to add in the 10 pm dose of candesartan .    She sent a message in mid-November, had some BP readings that were elevated and went to ED.  Had been treated with cipro  for UTI and noted that this often caused her pressure to increase.  Over the course of a week she called EMS on at least 2 occasions, although did not wish to go to ED after the first visit.   We increased her candesartan  to 16 mg bid, and added spironolactone  25 mg daily.   Since then she was seen by Lum Louis NP here, with a pressure of 122/66.  Home readings have been varied, but mostly < 130 systolic, often in the 110-119 range.    Today she is  in the office for follow up. Home readings have been much improved, with only a few readings over 140 and more in the 110-120 systolic range.  She is doing well with the current medication dosing and feeling better about her pressure.    States readings good for the past few months.  Three days per week pickleball, seeing low 100's.  Checking twice daily 3 pm and 3 am.  Sleeping now around 3:30 am,  highest around 136;  Concern today - APAP PM for sleep when hit menopause  PCP told her to stop liver doesn't look good Not using xanax  anymore for BP  Past Medical History: Carotid stenosis 9/21 1-39% stenosis bilaterally  hyperlipidemia 4/23 LDL 79 on atorvastatin  80  DM2 6/22 A1c 6.3, down from 6.7 (2017)  Fibromuscular dysplasia With 60% stenosis in Left renal artery    Blood Pressure Goal:  130/80  Current Medications: candesartan  16 mg (3 pm, 3 am);  metoprolol  succ 25 mg bid, spironolactone  25 qd  Family Hx: father died at 55 (last year) ckd, GI bowel problems, mom at 78 from brain hemorrhage, had enlarged heart 2/2 hypertension (only weighed ~100 lb); 2 children in 31's healthy  Social Hx: no tobacco, quit in 08; occasional alcohol - maybe once a month at most; no coffee, 6 oz coke in the am;  otherwise water.  Hot tea if feeling sick  Diet: mix of home and restaurant; does use some salt (light); salad and meat most nights; peanut butter crackers;  More carbs Fri-Sun  Exercise: no regular exercise, hoping to join silver sneakers soon - will start once Gyn cleared   Home BP readings: continues to check multiple times per day.  Range 115-150, with average in low 130s  Intolerances: ACEI - cough, omega 3 oils - diarrhea in larger doses  Labs: 4/23: Na 137, K 3.7, Glu 137, BUN 12, SCr 0.54, GFR > 60   Wt Readings from Last 3 Encounters:  07/26/23 147 lb (66.7 kg)  05/08/23 148 lb (67.1 kg)  04/13/23 146 lb 6.4 oz (66.4 kg)   BP Readings from Last 3 Encounters:  07/26/23 (!)  95/48  05/24/23 (!) 91/53  05/08/23 122/66   Pulse Readings from Last 3 Encounters:  07/26/23 (!) 48  05/24/23 (!) 56  05/08/23 63    Current Outpatient Medications  Medication Sig Dispense Refill   Acetylcarnitine HCl (ACETYL L-CARNITINE) 250 MG CAPS Take by mouth. 200 mg every other day     ALPRAZolam  (XANAX ) 0.25 MG tablet Take 1 tablet (0.25 mg total) by mouth 2 (two) times daily as needed for anxiety. (Patient taking differently: Take 0.25 mg by mouth 2 (two) times daily as needed (For HBP if systolic is over 849.).) 180 tablet 1   atorvastatin  (LIPITOR ) 80 MG tablet Take 1 tablet (80 mg total) by mouth daily. 90 tablet 3   b complex vitamins tablet Take 1 tablet by mouth daily.     Biotin 300 MCG TABS Take 600 mcg by mouth every other day.     Calcium -Magnesium-Zinc 500-250-12.5 MG TABS Take 1 tablet by mouth every other day.     candesartan  (ATACAND ) 16 MG tablet Take 1 tablet (16 mg total) by mouth in the morning and at bedtime. 180 tablet 3   Cholecalciferol (VITAMIN D) 50 MCG (2000 UT) CAPS Take 2,000 Units by mouth daily.     Chromium Picolinate 200 MCG TABS Take 1 tablet by mouth every other day.     clobetasol ointment (TEMOVATE) 0.05 % as needed.     clopidogrel  (PLAVIX ) 75 MG tablet TAKE 1 TABLET BY MOUTH DAILY 90 tablet 3   Coenzyme Q10 (COQ10) 200 MG CAPS Take 1 capsule by mouth daily.     CRANBERRY JUICE EXTRACT PO Take 500 mg by mouth daily.     EPINEPHrine  0.3 mg/0.3 mL IJ SOAJ injection INJECT 0.3 MLS INTO THE MUSCLE ONCE FOR 1 DOSE  0   famotidine  (PEPCID  AC) 10 MG tablet Take 1 tablet (10 mg total) by mouth 2 (two) times daily. Take  1-2 tablets twice daily 180 tablet 3   finasteride  (PROSCAR ) 5 MG tablet Take 1 tablet (5 mg total) by mouth daily. 90 tablet 3   Flaxseed, Linseed, (FLAX SEED OIL) 1000 MG CAPS Take 1,200 mg by mouth daily.     Garlic Oil 500 MG TABS Take 1 tablet by mouth daily.     metoprolol  succinate (TOPROL -XL) 50 MG 24 hr tablet Take 1 tablet  (50 mg total) by mouth daily. 90 tablet 3   nitroGLYCERIN  (NITROSTAT ) 0.4 MG SL tablet Place 1 tablet (0.4 mg total) under the tongue every 5 (five) minutes x 3 doses as needed for chest pain. 25 tablet 3   omega-3 acid ethyl esters (LOVAZA ) 1 g capsule TAKE 1 CAPSULE BY MOUTH EVERY DAY 90 capsule 0  OVER THE COUNTER MEDICATION Take 1,000 mg by mouth in the morning and at bedtime. Beetroot 1000 mg capsules     Probiotic Product (ALIGN PO) Take 1 capsule by mouth daily.     Rhodiola 300 MG CAPS Take 1 capsule by mouth daily.       spironolactone  (ALDACTONE ) 25 MG tablet Take 1 tablet (25 mg total) by mouth daily. 90 tablet 3   sucralfate  (CARAFATE ) 1 GM/10ML suspension Take 10 mLs (1 g total) by mouth every 8 (eight) hours as needed. 420 mL 1   terbinafine  (LAMISIL ) 250 MG tablet Take 1 tablet (250 mg total) by mouth daily. 90 tablet 0   triamcinolone (NASACORT) 55 MCG/ACT AERO nasal inhaler      vitamin C (ASCORBIC ACID) 500 MG tablet Take 1,500 mg by mouth daily.     vitamin E 180 MG (400 UNITS) capsule Take 400 Units by mouth every other day.     No current facility-administered medications for this visit.    Allergies  Allergen Reactions   Benzonatate      Unknown, patient states it made me sick   Chlorpromazine Hcl     unknown   Codeine     REACTION: causes vomiting   Doxycycline      explosive diarrhea -does not wish to take this anymore   Hydrocodone     REACTION: causes vomiing and nausea   Hydrocodone-Acetaminophen Nausea And Vomiting   Lisinopril  Cough    Patient states that she will no longer take this medication because it gave her a bad cough    Lovaza  [Omega-3-Acid  Ethyl Esters (Fish)] Other (See Comments)    Loose stools with two capsules- can take 1 capsule with no issues    Penicillins     REACTION: causes rash   Prochlorperazine Edisylate     unknown    Promethazine Hcl     unknown   Sulfonamide Derivatives     unknown     Past Medical History:   Diagnosis Date   Allergy    Anxiety    no anxiety per pt- she wants removed    CAD (coronary artery disease)    Cataract    forming    Chronic insomnia    Colon polyp    Depression    GERD (gastroesophageal reflux disease)    GERD with esophagitis    Heart attack (HCC) 06/2007   Heart murmur    Hyperlipemia    Hypertension    Osteopenia    Perirectal abscess    Shingles 08/31/2017   2nd dx with shingles, per pt- shingles x 4 - severe rxn to shingles vax - last shingles 05-2021    Blood pressure (!) 95/48, pulse (!) 48, height 5' 1 (1.549 m), weight 147 lb (66.7 kg).  Essential hypertension Assessment: BP is controlled in office BP 95/48 mmHg;  Will bring home readings, with HR in the next day or two Tolerates candesartan , metoprolol  and spironolactone  well without any side effects Denies SOB, palpitation, chest pain, headaches,or swelling Reiterated the importance of regular exercise and low salt diet   Plan:  Continue taking candesartan  16 mg (3 pm, 3 am);  metoprolol  succ 25 mg bid, spironolactone  25 qd Patient to keep record of BP readings with heart rate and report to us  at the next visit Patient to follow up with Dr. Court in March  Labs ordered today:  none   Hyperlipidemia LDL goal <55 Assessment: Patient with ASCVD not at LDL goal of <  55 Recent renal dopplers show 1=59% stenosis on right, 60% on left (no change from 2 years ago) Most recent LDL 92 on 07/28/2022 Has been compliant with high intensity statin: atorvastatin  80 mg daily Could not tolerate full dose of Lovaza  (diarrhea);  currently taking 1 capsule daily (has for year or more)  Plan: Repeat lipid/liver labs today Continue atorvastatin  80 and lovaza  1 gm  If triglycerides elevated will switch Lovaza  to Vascepa , to avoid potential increase in LDL  Follow up with Dr. Court in March    Allean Mink PharmD CPP [facility] [address] [place],  [place] [address] [phone]

## 2023-07-26 NOTE — Assessment & Plan Note (Signed)
 Assessment: Patient with ASCVD not at LDL goal of < 55 Recent renal dopplers show 1=59% stenosis on right, 60% on left (no change from 2 years ago) Most recent LDL 92 on 07/28/2022 Has been compliant with high intensity statin: atorvastatin  80 mg daily Could not tolerate full dose of Lovaza  (diarrhea);  currently taking 1 capsule daily (has for year or more)  Plan: Repeat lipid/liver labs today Continue atorvastatin  80 and lovaza  1 gm  If triglycerides elevated will switch Lovaza  to Vascepa , to avoid potential increase in LDL  Follow up with Dr. Court in March

## 2023-07-27 ENCOUNTER — Telehealth: Payer: Self-pay | Admitting: Pharmacist Clinician (PhC)/ Clinical Pharmacy Specialist

## 2023-07-27 ENCOUNTER — Other Ambulatory Visit: Payer: Self-pay

## 2023-07-27 DIAGNOSIS — E782 Mixed hyperlipidemia: Secondary | ICD-10-CM

## 2023-07-27 NOTE — Telephone Encounter (Signed)
 Pt came in and dropped of envelope  Left on your desk

## 2023-07-28 LAB — HEPATIC FUNCTION PANEL
ALT: 16 [IU]/L (ref 0–32)
AST: 23 [IU]/L (ref 0–40)
Albumin: 4.4 g/dL (ref 3.8–4.8)
Alkaline Phosphatase: 71 [IU]/L (ref 44–121)
Bilirubin Total: 0.5 mg/dL (ref 0.0–1.2)
Bilirubin, Direct: 0.2 mg/dL (ref 0.00–0.40)
Total Protein: 6.5 g/dL (ref 6.0–8.5)

## 2023-07-28 LAB — LIPID PANEL
Chol/HDL Ratio: 3.3 {ratio} (ref 0.0–4.4)
Cholesterol, Total: 172 mg/dL (ref 100–199)
HDL: 52 mg/dL (ref >39)
LDL Chol Calc (NIH): 103 mg/dL — ABNORMAL HIGH (ref 0–99)
Triglycerides: 91 mg/dL (ref 0–149)
VLDL Cholesterol Cal: 17 mg/dL (ref 5–40)

## 2023-07-28 MED ORDER — METOPROLOL SUCCINATE ER 25 MG PO TB24: 25.0000 mg | ORAL_TABLET | Freq: Every day | ORAL | 3 refills | Status: DC

## 2023-07-28 NOTE — Telephone Encounter (Signed)
 Patient left list of home BP readings for past few months.  Looking at the last 18 readings: AM  128/64  HR 48 PM  106/57  HR 50  Heart rate  as low as 42 in her home readings.  Will decrease metoprolol  succ from 50 to 25 mg daily.

## 2023-07-30 ENCOUNTER — Encounter: Payer: Medicare HMO | Admitting: Family Medicine

## 2023-08-02 ENCOUNTER — Other Ambulatory Visit (HOSPITAL_COMMUNITY): Payer: Self-pay | Admitting: Cardiology

## 2023-08-07 ENCOUNTER — Telehealth: Payer: Self-pay

## 2023-08-07 ENCOUNTER — Other Ambulatory Visit (HOSPITAL_COMMUNITY): Payer: Self-pay

## 2023-08-07 NOTE — Telephone Encounter (Signed)
Pharmacy Patient Advocate Encounter   Received notification from CoverMyMeds that prior authorization for LOVAZA is required/requested.   Insurance verification completed.   The patient is insured through [facility] .   Per test claim:  VASCEPA [facility] is preferred by the insurance.  If suggested medication is appropriate, Please send in a new RX and discontinue this one. If not, please advise as to why it's not appropriate so that we may request a Prior Authorization. Please note, some preferred medications may still require a PA.  If the suggested medications have not been trialed and there are no contraindications to their use, the PA will not be submitted, as it will not be approved. VASCEPA BRAND IS GOING THROUGH WITHOUT A PA

## 2023-08-08 ENCOUNTER — Encounter: Payer: Self-pay | Admitting: Pharmacist

## 2023-08-08 MED ORDER — VASCEPA 1 G PO CAPS: 1.0000 g | ORAL_CAPSULE | Freq: Every day | ORAL | 3 refills | Status: AC

## 2023-08-08 NOTE — Telephone Encounter (Signed)
Yes, switch is acceptable. I sent in rx and sent message to patient.

## 2023-08-08 NOTE — Addendum Note (Signed)
Addended by: Malena Peer D on: 08/08/2023 09:40 AM   Modules accepted: Orders

## 2023-08-10 ENCOUNTER — Encounter: Payer: Self-pay | Admitting: Pharmacist Clinician (PhC)/ Clinical Pharmacy Specialist

## 2023-09-06 ENCOUNTER — Ambulatory Visit: Attending: Cardiovascular Disease | Admitting: Pharmacist Clinician (PhC)/ Clinical Pharmacy Specialist

## 2023-09-06 ENCOUNTER — Encounter: Payer: Self-pay | Admitting: Pharmacist Clinician (PhC)/ Clinical Pharmacy Specialist

## 2023-09-06 VITALS — BP 122/66 | HR 66 | Ht 61.0 in | Wt 146.0 lb

## 2023-09-06 DIAGNOSIS — I10 Essential (primary) hypertension: Secondary | ICD-10-CM

## 2023-09-06 NOTE — Assessment & Plan Note (Signed)
 Assessment: BP is controlled in office BP 122/66 mmHg. Home readings have been higher in the past 2 weeks, most likely due to family stresses as well as health concerns.  Tolerates candesartan and spironolactone well without any side effects Denies SOB, palpitation, chest pain, headaches,or swelling Reiterated the importance of regular exercise and low salt diet   Plan:  Take an extra 1/2 tablet of spironolactone if your BP stays < 150 systolic for 3 days.   Continue taking candesartan and daily spironolactone as directed Patient to keep record of BP readings with heart rate and report to [place] at the next visit Patient to follow up with Dr> Allyson Sabal in 1 week  Labs ordered today:  none

## 2023-09-06 NOTE — Patient Instructions (Signed)
 Follow up appointment: with Dr. Allyson Sabal next week  Take your BP meds as follows: continue with candesartan and spironolactone.  If you have readings > 150 systolic for 3 days, then take an extra 1/2 tablet of spironolactone  Check your blood pressure at home daily (if able) and keep record of the readings.  Your blood pressure goal is < 130/80  Systolic (top number) 95 to about 150  Diastolic (middle number) < 80  Heart Rate (bottom number) 50-100  To check your pressure at home you will need to:  1. Sit up in a chair, with feet flat on the floor and back supported. Do not cross your ankles or legs. 2. Rest your left arm so that the cuff is about heart level. If the cuff goes on your upper arm,  then just relax the arm on the table, arm of the chair or your lap. If you have a wrist cuff, we  suggest relaxing your wrist against your chest (think of it as Pledging the Flag with the  wrong arm).  3. Place the cuff snugly around your arm, about 1 inch above the crook of your elbow. The  cords should be inside the groove of your elbow.  4. Sit quietly, with the cuff in place, for about 5 minutes. After that 5 minutes press the power  button to start a reading. 5. Do not talk or move while the reading is taking place.  6. Record your readings on a sheet of paper. Although most cuffs have a memory, it is often  easier to see a pattern developing when the numbers are all in front of you.  7. You can repeat the reading after 1-3 minutes if it is recommended  Make sure your bladder is empty and you have not had caffeine or tobacco within the last 30 min  Always bring your blood pressure log with you to your appointments. If you have not brought your monitor in to be double checked for accuracy, please bring it to your next appointment.  You can find a list of quality blood pressure cuffs at [email or web address]  Important lifestyle changes to control high blood pressure  Intervention  Effect on the  BP  Lose extra pounds and watch your waistline Weight loss is one of the most effective lifestyle changes for controlling blood pressure. If you're overweight or obese, losing even a small amount of weight can help reduce blood pressure. Blood pressure might go down by about 1 millimeter of mercury (mm Hg) with each kilogram (about 2.2 pounds) of weight lost.  Exercise regularly As a general goal, aim for at least 30 minutes of moderate physical activity every day. Regular physical activity can lower high blood pressure by about 5 to 8 mm Hg.  Eat a healthy diet Eating a diet rich in whole grains, fruits, vegetables, and low-fat dairy products and low in saturated fat and cholesterol. A healthy diet can lower high blood pressure by up to 11 mm Hg.  Reduce salt (sodium) in your diet Even a small reduction of sodium in the diet can improve heart health and reduce high blood pressure by about 5 to 6 mm Hg.  Limit alcohol One drink equals 12 ounces of beer, 5 ounces of wine, or 1.5 ounces of 80-proof liquor.  Limiting alcohol to less than one drink a day for women or two drinks a day for men can help lower blood pressure by about 4 mm Hg.   If you have  any questions or concerns please use My Chart to send questions or call the office at [phone]

## 2023-09-06 NOTE — Progress Notes (Signed)
 09/06/2023 Emily [name] 1950/06/22 [id number]   HPI:  Emily [name] is a 73 y.o. female patient of Dr Allyson Sabal, with a PMH below who presents today for hypertension clinic evaluation.  Back in 2021 she was referred to the advanced hypertension clinic for labile blood pressure readings, and it was noted that stress affected her readings.  In doing secondary workup, renal dopplers showed 1-59% blockage of right renal artery and > 60% in left.  A follow up CT suggested beaded configuration compatible with fibromuscular dysplasia with 50-60% left blockage.  She has been on metoprolol succ and candesartan for a few years now and tolerates both without issue.  She felt that her pressure was controlled until she had the shingles vaccine, but again we managed to get her back under control and she stopped checking home pressures (was checking up to 6 times/day).    Over the last few years I have seen her every few months.  She seems to be mostly controlled at this time, although occasional stressful events will spike her readings.  Today she notes that there was a death in the family (22 yo niece from DM1 complications) and she has been having some health issues.  This has led her to stop playing pickle ball for the past 2 weeks, but she hopes to be able to get back next week.  She continues to check her pressure every 12 hours and note what meds she takes at those times.   About a month ago we had her stop the metoprolol because of low heart rates, and since then have seen those readings increase to mostly in the low 60's.     Past Medical History: Carotid stenosis 9/21 1-39% stenosis bilaterally  hyperlipidemia 4/23 LDL 79 on atorvastatin 80  DM2 6/22 A1c 6.3, down from 6.7 (2017)  Fibromuscular dysplasia With 60% stenosis in Left renal artery    Blood Pressure Goal:  130/80  Current Medications: candesartan 16 mg (3 pm, 3 am);  spironolactone 25 qd  Family Hx: father died at 68 - CKD, GI  bowel problems, mom at 45 from brain hemorrhage, had enlarged heart 2/2 hypertension (only weighed ~100 lb); 2 children in 76's healthy  Social Hx: no tobacco, quit in 08; occasional alcohol - maybe once a month at most; no coffee, 6 oz coke in the am; otherwise water.  Hot tea if feeling sick  Diet: mix of home and restaurant; does use some salt (light); salad and meat most nights; peanut butter crackers;  More carbs Fri-Sun  Exercise: pickle ball regularly   Home BP readings: continues to check multiple times per day.   For March her 3 am readings average 155/82, whereas the same time period las month average was 131/66  3 pm readings in March average 119/71  Intolerances: ACEI - cough, omega 3 oils - diarrhea in larger doses  Labs: 4/23: Na 137, K 3.7, Glu 137, BUN 12, SCr 0.54, GFR > 60   Wt Readings from Last 3 Encounters:  09/06/23 146 lb (66.2 kg)  07/26/23 147 lb (66.7 kg)  05/08/23 148 lb (67.1 kg)   BP Readings from Last 3 Encounters:  09/06/23 122/66  07/26/23 (!) 95/48  05/24/23 (!) 91/53   Pulse Readings from Last 3 Encounters:  09/06/23 66  07/26/23 (!) 48  05/24/23 (!) 56    Current Outpatient Medications  Medication Sig Dispense Refill   Acetylcarnitine HCl (ACETYL L-CARNITINE) 250 MG CAPS Take by mouth. 200 mg  every other day     ALPRAZolam (XANAX) 0.25 MG tablet Take 1 tablet (0.25 mg total) by mouth 2 (two) times daily as needed for anxiety. (Patient taking differently: Take 0.25 mg by mouth 2 (two) times daily as needed (For HBP if systolic is over 409.).) 180 tablet 1   atorvastatin (LIPITOR) 80 MG tablet Take 1 tablet (80 mg total) by mouth daily. NEEDS FOLLOW UP APPOINTMENT FOR MORE REFILLS 90 tablet 0   b complex vitamins tablet Take 1 tablet by mouth daily.     Biotin 300 MCG TABS Take 600 mcg by mouth every other day.     Calcium-Magnesium-Zinc 500-250-12.5 MG TABS Take 1 tablet by mouth every other day.     candesartan (ATACAND) 16 MG tablet Take 1  tablet (16 mg total) by mouth in the morning and at bedtime. 180 tablet 3   Cholecalciferol (VITAMIN D) 50 MCG (2000 UT) CAPS Take 2,000 Units by mouth daily.     Chromium Picolinate 200 MCG TABS Take 1 tablet by mouth every other day.     clobetasol ointment (TEMOVATE) 0.05 % as needed.     clopidogrel (PLAVIX) 75 MG tablet TAKE 1 TABLET BY MOUTH DAILY 90 tablet 3   Coenzyme Q10 (COQ10) 200 MG CAPS Take 1 capsule by mouth daily.     CRANBERRY JUICE EXTRACT PO Take 500 mg by mouth daily.     EPINEPHrine 0.3 mg/0.3 mL IJ SOAJ injection INJECT 0.3 MLS INTO THE MUSCLE ONCE FOR 1 DOSE  0   famotidine (PEPCID AC) 10 MG tablet Take 1 tablet (10 mg total) by mouth 2 (two) times daily. Take  1-2 tablets twice daily 180 tablet 3   finasteride (PROSCAR) 5 MG tablet Take 1 tablet (5 mg total) by mouth daily. NEEDS FOLLOW UP APPOINTMENT FOR MORE REFILLS 90 tablet 0   Flaxseed, Linseed, (FLAX SEED OIL) 1000 MG CAPS Take 1,200 mg by mouth daily.     Garlic Oil 500 MG TABS Take 1 tablet by mouth daily.     metoprolol succinate (TOPROL XL) 25 MG 24 hr tablet Take 1 tablet (25 mg total) by mouth daily. 90 tablet 3   nitroGLYCERIN (NITROSTAT) 0.4 MG SL tablet Place 1 tablet (0.4 mg total) under the tongue every 5 (five) minutes x 3 doses as needed for chest pain. 25 tablet 3   OVER THE COUNTER MEDICATION Take 1,000 mg by mouth in the morning and at bedtime. Beetroot 1000 mg capsules     Probiotic Product (ALIGN PO) Take 1 capsule by mouth daily.     Rhodiola 300 MG CAPS Take 1 capsule by mouth daily.       spironolactone (ALDACTONE) 25 MG tablet Take 1 tablet (25 mg total) by mouth daily. 90 tablet 3   sucralfate (CARAFATE) 1 GM/10ML suspension Take 10 mLs (1 g total) by mouth every 8 (eight) hours as needed. 420 mL 1   terbinafine (LAMISIL) 250 MG tablet Take 1 tablet (250 mg total) by mouth daily. 90 tablet 0   triamcinolone (NASACORT) 55 MCG/ACT AERO nasal inhaler      VASCEPA 1 g capsule Take 1 capsule (1 g  total) by mouth daily. 90 capsule 3   vitamin C (ASCORBIC ACID) 500 MG tablet Take 1,500 mg by mouth daily.     vitamin E 180 MG (400 UNITS) capsule Take 400 Units by mouth every other day.     No current facility-administered medications for this visit.    Allergies  Allergen Reactions   Benzonatate     Unknown, patient states "it made me sick"   Chlorpromazine Hcl     unknown   Codeine     REACTION: causes vomiting   Doxycycline     "explosive diarrhea" -does not wish to take this anymore   Hydrocodone     REACTION: causes vomiing and nausea   Hydrocodone-Acetaminophen Nausea And Vomiting   Lisinopril Cough    Patient states that she will no longer take this medication because it gave her a bad cough    Lovaza [Omega-3-Acid Ethyl Esters (Fish)] Other (See Comments)    Loose stools with two capsules- can take 1 capsule with no issues    Penicillins     REACTION: causes rash   Prochlorperazine Edisylate     unknown    Promethazine Hcl     unknown   Sulfonamide Derivatives     unknown     Past Medical History:  Diagnosis Date   Allergy    Anxiety    no anxiety per pt- she wants removed    CAD (coronary artery disease)    Cataract    forming    Chronic insomnia    Colon polyp    Depression    GERD (gastroesophageal reflux disease)    GERD with esophagitis    Heart attack (HCC) 06/2007   Heart murmur    Hyperlipemia    Hypertension    Osteopenia    Perirectal abscess    Shingles 08/31/2017   2nd dx with shingles, per pt- shingles x 4 - severe rxn to shingles vax - last shingles 05-2021    Blood pressure 122/66, pulse 66, height 5\' 1"  (1.549 m), weight 146 lb (66.2 kg).  Essential hypertension Assessment: BP is controlled in office BP 122/66 mmHg. Home readings have been higher in the past 2 weeks, most likely due to family stresses as well as health concerns.  Tolerates candesartan and spironolactone well without any side effects Denies SOB, palpitation,  chest pain, headaches,or swelling Reiterated the importance of regular exercise and low salt diet   Plan:  Take an extra 1/2 tablet of spironolactone if your BP stays < 150 systolic for 3 days.   Continue taking candesartan and daily spironolactone as directed Patient to keep record of BP readings with heart rate and report to [place] at the next visit Patient to follow up with Dr> Allyson Sabal in 1 week  Labs ordered today:  none   Phillips Hay PharmD CPP [facility] [address] [place], [place] [address] [phone]

## 2023-09-11 ENCOUNTER — Ambulatory Visit: Payer: Medicare HMO | Attending: Cardiovascular Disease | Admitting: Cardiovascular Disease

## 2023-09-11 VITALS — BP 110/72 | HR 70 | Ht 61.0 in | Wt 145.0 lb

## 2023-09-11 DIAGNOSIS — I10 Essential (primary) hypertension: Secondary | ICD-10-CM

## 2023-09-11 DIAGNOSIS — I6522 Occlusion and stenosis of left carotid artery: Secondary | ICD-10-CM | POA: Diagnosis not present

## 2023-09-11 DIAGNOSIS — I701 Atherosclerosis of renal artery: Secondary | ICD-10-CM | POA: Diagnosis not present

## 2023-09-11 NOTE — Assessment & Plan Note (Signed)
 History of hypertension with blood pressure measured today at 110/72.  She is on Atacand and spironolactone.  Metoprolol was discontinued because of bradycardia.  She is keeps an extensive blood pressure log and has been followed by our Pharm.D.  Blood pressures in the early morning hours are elevated.  There is a question of renal artery stenosis with renal Dopplers recently performed 05/31/2023 revealing renal aortic ratio of 1.97 on the right and 2.57 on the left.  She had a abdominal CTA performed 11/3//21 that showed possibility of 50% bilateral renal artery FMD.  Will continue to follow her renal Doppler studies and she will follow-up with a Pharm.D.  She has not been on amlodipine in the past.

## 2023-09-11 NOTE — Progress Notes (Signed)
 09/11/2023 Emily [name]   [birth year]  [id number]  Primary Physician Pcp, No Primary Cardiologist: Runell Gess MD [name]  HPI:  Emily [name] is a 73 y.o.   mildly overweight married Caucasian female mother of 2 children, grandmother 2 grandchildren who was referred by Dr. Duke Salvia for evaluation of renal vascular hypertension.  She is a retired Scientist, physiological.  Her primary cardiologist is Dr. Shirlee Latch.  I last saw her in the office 09/02/2021.  She is accompanied by her husband Aivars.  risk factors include 70-pack-year tobacco abuse having quit approximately 13 years ago, treated hypertension and hyperlipidemia.  Her father had stents in his 53s.  She is never had a stroke but did have a myocardial infarction back 1/09 and had stenting of OM 3 by Dr. [name].  She received the shingles vaccine in June and after that her stable blood pressure became more difficult to manage.  She did develop an ACE cough and was switched to Atacand.  Her beta-blocker was titrated.  Her blood pressures at home of been well controlled.  She has been Educated about dietary modification salt restriction.  Renal Doppler studies performed 02/03/2020 revealed symmetric renal dimensions with a suggestion of left renal artery stenosis with a left renal aortic ratio of 4.42.  Since I saw her in the office 2 years ago she continues to do well.  She plays pickle ball without limitation.  She has seen our Pharm.D. for blood pressure control.  Her last several blood pressures in the office have been within normal limits although her blood pressure log does show spikes in her blood pressure especially in the early morning hours.  She is on Atacand and spironolactone.  Her beta-blocker was discontinued because of bradycardia.  Her most recent renal Doppler studies performed 05/31/2023 revealed renal aortic ratio on the right of 1.97 and on the left of 2.57.  Of note, she did have an abdominal CTA  performed 04/21/2020 that showed 50% bilateral renal artery FMD.   Current Meds  Medication Sig   Acetylcarnitine HCl (ACETYL L-CARNITINE) 250 MG CAPS Take by mouth. 200 mg every other day   ALPRAZolam (XANAX) 0.25 MG tablet Take 1 tablet (0.25 mg total) by mouth 2 (two) times daily as needed for anxiety. (Patient taking differently: Take 0.25 mg by mouth 2 (two) times daily as needed (For HBP if systolic is over 409.).)   atorvastatin (LIPITOR) 80 MG tablet Take 1 tablet (80 mg total) by mouth daily. NEEDS FOLLOW UP APPOINTMENT FOR MORE REFILLS   b complex vitamins tablet Take 1 tablet by mouth daily.   Biotin 300 MCG TABS Take 600 mcg by mouth every other day.   Calcium-Magnesium-Zinc 500-250-12.5 MG TABS Take 1 tablet by mouth every other day.   candesartan (ATACAND) 16 MG tablet Take 1 tablet (16 mg total) by mouth in the morning and at bedtime.   Cholecalciferol (VITAMIN D) 50 MCG (2000 UT) CAPS Take 2,000 Units by mouth daily.   Chromium Picolinate 200 MCG TABS Take 1 tablet by mouth every other day.   clobetasol ointment (TEMOVATE) 0.05 % as needed.   clopidogrel (PLAVIX) 75 MG tablet TAKE 1 TABLET BY MOUTH DAILY   Coenzyme Q10 (COQ10) 200 MG CAPS Take 1 capsule by mouth daily.   CRANBERRY JUICE EXTRACT PO Take 500 mg by mouth daily.   EPINEPHrine 0.3 mg/0.3 mL IJ SOAJ injection INJECT 0.3 MLS INTO THE MUSCLE ONCE FOR 1 DOSE   famotidine (PEPCID  AC) 10 MG tablet Take 1 tablet (10 mg total) by mouth 2 (two) times daily. Take  1-2 tablets twice daily   finasteride (PROSCAR) 5 MG tablet Take 1 tablet (5 mg total) by mouth daily. NEEDS FOLLOW UP APPOINTMENT FOR MORE REFILLS   Flaxseed, Linseed, (FLAX SEED OIL) 1000 MG CAPS Take 1,200 mg by mouth daily.   Garlic Oil 500 MG TABS Take 1 tablet by mouth daily.   nitroGLYCERIN (NITROSTAT) 0.4 MG SL tablet Place 1 tablet (0.4 mg total) under the tongue every 5 (five) minutes x 3 doses as needed for chest pain.   OVER THE COUNTER MEDICATION Take  1,000 mg by mouth in the morning and at bedtime. Beetroot 1000 mg capsules   Probiotic Product (ALIGN PO) Take 1 capsule by mouth daily.   Rhodiola 300 MG CAPS Take 1 capsule by mouth daily.     terbinafine (LAMISIL) 250 MG tablet Take 1 tablet (250 mg total) by mouth daily.   triamcinolone (NASACORT) 55 MCG/ACT AERO nasal inhaler    VASCEPA 1 g capsule Take 1 capsule (1 g total) by mouth daily.   vitamin C (ASCORBIC ACID) 500 MG tablet Take 1,500 mg by mouth daily.   vitamin E 180 MG (400 UNITS) capsule Take 400 Units by mouth every other day.     Allergies  Allergen Reactions   Hydralazine Other (See Comments)    "Skin redness and my skin peeled off"   Benzonatate     Unknown, patient states "it made me sick"   Chlorpromazine Hcl     unknown   Codeine     REACTION: causes vomiting   Doxycycline     "explosive diarrhea" -does not wish to take this anymore   Hydrocodone     REACTION: causes vomiing and nausea   Hydrocodone-Acetaminophen Nausea And Vomiting   Lisinopril Cough    Patient states that she will no longer take this medication because it gave her a bad cough    Lovaza [Omega-3-Acid Ethyl Esters (Fish)] Other (See Comments)    Loose stools with two capsules- can take 1 capsule with no issues    Penicillins     REACTION: causes rash   Prochlorperazine Edisylate     unknown    Promethazine Hcl     unknown   Sulfonamide Derivatives     unknown     Social History   Socioeconomic History   Marital status: Married    Spouse name: Not on file   Number of children: 2   Years of education: Not on file   Highest education level: Not on file  Occupational History   Occupation: housewife/ student  Tobacco Use   Smoking status: Former    Current packs/day: 0.00    Average packs/day: 2.0 packs/day for 20.0 years (40.0 ttl pk-yrs)    Types: Cigarettes    Start date: 06/19/1986    Quit date: 06/19/2006    Years since quitting: 17.2   Smokeless tobacco: Never  Vaping  Use   Vaping status: Never Used  Substance and Sexual Activity   Alcohol use: Not Currently    Alcohol/week: 0.0 standard drinks of alcohol    Comment: <2 monthly; rare   Drug use: No   Sexual activity: Not on file  Other Topics Concern   Not on file  Social History Narrative   Resides in [place] with her husband.    Husband works in [place] and comes home two to three times a year  2 children who go to [facility]   Just got accepted to [employer]   Exercise- NO   Social Drivers of Health   Financial Resource Strain: Not on file  Food Insecurity: Low Risk  (08/02/2023)   Received from [facility]   Hunger Vital Sign    Worried About Running Out of Food in the Last Year: Never true    Ran Out of Food in the Last Year: Never true  Transportation Needs: No Transportation Needs (08/02/2023)   Received from [facility]    In the past 12 months, has lack of reliable transportation kept you from medical appointments, meetings, work or from getting things needed for daily living? : No  Physical Activity: Not on file  Stress: Not on file  Social Connections: Not on file  Intimate Partner Violence: Not on file     Review of Systems: General: negative for chills, fever, night sweats or weight changes.  Cardiovascular: negative for chest pain, dyspnea on exertion, edema, orthopnea, palpitations, paroxysmal nocturnal dyspnea or shortness of breath Dermatological: negative for rash Respiratory: negative for cough or wheezing Urologic: negative for hematuria Abdominal: negative for nausea, vomiting, diarrhea, bright red blood per rectum, melena, or hematemesis Neurologic: negative for visual changes, syncope, or dizziness All other systems reviewed and are otherwise negative except as noted above.    Blood pressure 110/72, pulse 70, height 5\' 1"  (1.549 m), weight 145 lb (65.8 kg), SpO2 98%.  General appearance: alert and no distress Neck: no adenopathy, no JVD, supple,  symmetrical, trachea midline, thyroid not enlarged, symmetric, no tenderness/mass/nodules, and bilateral carotid bruits left louder than right Lungs: clear to auscultation bilaterally Heart: Soft outflow tract murmur consistent with aortic stenosis Extremities: extremities normal, atraumatic, no cyanosis or edema Pulses: 2+ and symmetric Skin: Skin color, texture, turgor normal. No rashes or lesions Neurologic: Grossly normal  EKG not performed today      ASSESSMENT AND PLAN:   Essential hypertension History of hypertension with blood pressure measured today at 110/72.  She is on Atacand and spironolactone.  Metoprolol was discontinued because of bradycardia.  She is keeps an extensive blood pressure log and has been followed by our Pharm.D.  Blood pressures in the early morning hours are elevated.  There is a question of renal artery stenosis with renal Dopplers recently performed 05/31/2023 revealing renal aortic ratio of 1.97 on the right and 2.57 on the left.  She had a abdominal CTA performed 11/3//21 that showed possibility of 50% bilateral renal artery FMD.  Will continue to follow her renal Doppler studies and she will follow-up with a Pharm.D.  She has not been on amlodipine in the past.  Carotid stenosis Will recheck carotid Doppler studies.     Runell Gess MD FACP,FACC,FAHA, [facility] 09/11/2023 4:22 PM

## 2023-09-11 NOTE — Patient Instructions (Addendum)
 Medication Instructions:  Your physician recommends that you continue on your current medications as directed. Please refer to the Current Medication list given to you today.  *If you need a refill on your cardiac medications before your next appointment, please call your pharmacy*   Testing/Procedures: Your physician has requested that you have a carotid duplex. This test is an ultrasound of the carotid arteries in your neck. It looks at blood flow through these arteries that supply the brain with blood. Allow one hour for this exam. There are no restrictions or special instructions. This will take place at [address] [facility], [address].  Please note: We ask at that you not bring children with you during ultrasound (echo/ vascular) testing. Due to room size and safety concerns, children are not allowed in the ultrasound rooms during exams. Our front office staff cannot provide observation of children in our lobby area while testing is being conducted. An adult accompanying a patient to their appointment will only be allowed in the ultrasound room at the discretion of the ultrasound technician under special circumstances. We apologize for any inconvenience.   Your physician has requested that you have a renal artery duplex. During this test, an ultrasound is used to evaluate blood flow to the kidneys. Take your medications as you usually do. **To do in December**  No food after 11PM the night before.  Water is OK. (Don't drink liquids if you have been instructed not to for ANOTHER test). Avoid foods that produce bowel gas, for 24 hours prior to exam (see below). No breakfast, no chewing gum, no smoking or carbonated beverages. Patient may take morning medications with water. Come in for test at least 15 minutes early to register.  Please note: We ask at that you not bring children with you during ultrasound (echo/ vascular) testing. Due to room size and safety concerns, children are not allowed  in the ultrasound rooms during exams. Our front office staff cannot provide observation of children in our lobby area while testing is being conducted. An adult accompanying a patient to their appointment will only be allowed in the ultrasound room at the discretion of the ultrasound technician under special circumstances. We apologize for any inconvenience.    Follow-Up: At [facility], you and your health needs are our priority.  As part of our continuing mission to provide you with exceptional heart care, we have created designated Provider Care Teams.  These Care Teams include your primary Cardiologist (physician) and Advanced Practice Providers (APPs -  Physician Assistants and Nurse Practitioners) who all work together to provide you with the care you need, when you need it.  We recommend signing up for the patient portal called "MyChart".  Sign up information is provided on this After Visit Summary.  MyChart is used to connect with patients for Virtual Visits (Telemedicine).  Patients are able to view lab/test results, encounter notes, upcoming appointments, etc.  Non-urgent messages can be sent to your provider as well.   To learn more about what you can do with MyChart, go to [email or web address].    Your next appointment:   12 month(s)  Provider:   Nanetta Batty, MD    Other Instructions We will schedule you a follow up appointment with Belenda Cruise for late May.    1st Floor: - Lobby - Registration  - Pharmacy  - Lab - Cafe  2nd Floor: - PV Lab - Diagnostic Testing (echo, CT, nuclear med)  3rd Floor: - Vacant  4th Floor: -  TCTS (cardiothoracic surgery) - AFib Clinic - Structural Heart Clinic - Vascular Surgery  - Vascular Ultrasound  5th Floor: - HeartCare Cardiology (general and EP) - Clinical Pharmacy for coumadin, hypertension, lipid, weight-loss medications, and med management appointments    Valet parking services will be available as well.

## 2023-09-11 NOTE — Assessment & Plan Note (Signed)
 Will recheck carotid Doppler studies.

## 2023-09-27 ENCOUNTER — Ambulatory Visit (HOSPITAL_COMMUNITY)
Admission: RE | Admit: 2023-09-27 | Discharge: 2023-09-27 | Disposition: A | Discharge status: Home / Self Care | Source: Ambulatory Visit | Attending: Cardiovascular Disease | Admitting: Cardiovascular Disease

## 2023-09-27 DIAGNOSIS — I6522 Occlusion and stenosis of left carotid artery: Secondary | ICD-10-CM | POA: Diagnosis present

## 2023-09-28 ENCOUNTER — Other Ambulatory Visit (HOSPITAL_COMMUNITY): Payer: Self-pay | Admitting: *Deleted

## 2023-09-28 ENCOUNTER — Telehealth: Payer: Self-pay | Admitting: *Deleted

## 2023-09-28 DIAGNOSIS — I6522 Occlusion and stenosis of left carotid artery: Secondary | ICD-10-CM

## 2023-09-28 DIAGNOSIS — I2510 Atherosclerotic heart disease of native coronary artery without angina pectoris: Secondary | ICD-10-CM

## 2023-09-28 NOTE — Telephone Encounter (Signed)
 Called and left detail result message on on voicemail. Any question may call back. Order placed for 12 month repeat of  carotid arteries.

## 2023-09-28 NOTE — Telephone Encounter (Signed)
-----   Message from Nanetta Batty sent at 09/27/2023  3:42 PM EDT ----- Mild increase in LICA stenosis. Repeat 12 months

## 2023-10-10 ENCOUNTER — Encounter (HOSPITAL_COMMUNITY): Admitting: Cardiology

## 2023-11-05 ENCOUNTER — Ambulatory Visit: Payer: Medicare HMO | Admitting: Podiatry

## 2023-11-05 ENCOUNTER — Encounter: Payer: Self-pay | Admitting: Podiatry

## 2023-11-05 DIAGNOSIS — Z79899 Other long term (current) drug therapy: Secondary | ICD-10-CM

## 2023-11-05 DIAGNOSIS — B351 Tinea unguium: Secondary | ICD-10-CM | POA: Diagnosis not present

## 2023-11-05 NOTE — Progress Notes (Signed)
 Chief Complaint  Patient presents with   Nail Problem    F/U lamisil  #1.  Non diabetic. 0 Pain.    Subjective: 73 y.o. female presenting today with her husband for follow-up evaluation of thick discoloration to the toenails bilateral.  She has noticed improvement with the oral Lamisil  and has tolerated it well.  No new complaints  Brief history: Onset for several years.  Patient states that she has tried topical antifungal medication with no improvement.  About 20+ years ago she also tried oral antifungal medication which created nausea and vomiting.  Past Medical History:  Diagnosis Date   Allergy    Anxiety    no anxiety per pt- she wants removed    CAD (coronary artery disease)    Cataract    forming    Chronic insomnia    Colon polyp    Depression    GERD (gastroesophageal reflux disease)    GERD with esophagitis    Heart attack (HCC) 06/2007   Heart murmur    Hyperlipemia    Hypertension    Osteopenia    Perirectal abscess    Shingles 08/31/2017   2nd dx with shingles, per pt- shingles x 4 - severe rxn to shingles vax - last shingles 05-2021    Past Surgical History:  Procedure Laterality Date   CESAREAN SECTION     x 2   COLONOSCOPY     COLONOSCOPY W/ POLYPECTOMY  2007   INCISION AND DRAINAGE ABSCESS ANAL     POLYPECTOMY     PTCA with stent to third obtuse marginal     WISDOM TOOTH EXTRACTION      Allergies  Allergen Reactions   Hydralazine  Other (See Comments)    "Skin redness and my skin peeled off"   Benzonatate      Unknown, patient states "it made me sick"   Chlorpromazine Hcl     unknown   Codeine     REACTION: causes vomiting   Doxycycline      "explosive diarrhea" -does not wish to take this anymore   Hydrocodone     REACTION: causes vomiing and nausea   Hydrocodone-Acetaminophen Nausea And Vomiting   Lisinopril  Cough    Patient states that she will no longer take this medication because it gave her a bad cough    Lovaza  [Omega-3-Acid   Ethyl Esters (Fish)] Other (See Comments)    Loose stools with two capsules- can take 1 capsule with no issues    Penicillins     REACTION: causes rash   Prochlorperazine Edisylate     unknown    Promethazine Hcl     unknown   Sulfonamide Derivatives     unknown     B/L feet 05/09/2023  Objective: Physical Exam General: The patient is alert and oriented x3 in no acute distress.  Dermatology: Overall significant improvement.  There continues to be some hyperkeratotic, discolored, thickened, onychodystrophy noted. Skin is warm, dry and supple bilateral lower extremities. Negative for open lesions or macerations.  Vascular: Palpable pedal pulses bilaterally. No edema or erythema noted. Capillary refill within normal limits.  Neurological: Grossly intact via light touch  Musculoskeletal Exam: No pedal deformity noted.  Postmenopausal lipoma noted to the lateral aspect of the right anterior ankle  Assessment: #1 Onychomycosis of toenails bilateral; improved #2 lipoma right anterior lateral ankle; asymptomatic  Plan of Care:  -Patient was evaluated. - Overall improvement of the appearance of the toenail fungus on the oral Lamisil .  She is tolerated  this well -Order placed for updated hepatic function panel.  If WNL represcribe Lamisil  250 mg #90 daily -Return to clinic 6 months   Dot Gazella, DPM [facility]  Dr. Dot Gazella, DPM    [address] [place], [place] [address]                Office [phone]  Fax [phone]

## 2023-11-07 LAB — HEPATIC FUNCTION PANEL
AG Ratio: 1.9 (calc) (ref 1.0–2.5)
ALT: 16 U/L (ref 6–29)
AST: 19 U/L (ref 10–35)
Albumin: 4.5 g/dL (ref 3.6–5.1)
Alkaline phosphatase (APISO): 70 U/L (ref 37–153)
Bilirubin, Direct: 0.2 mg/dL (ref 0.0–0.2)
Globulin: 2.4 g/dL (ref 1.9–3.7)
Indirect Bilirubin: 0.6 mg/dL (ref 0.2–1.2)
Total Bilirubin: 0.8 mg/dL (ref 0.2–1.2)
Total Protein: 6.9 g/dL (ref 6.1–8.1)

## 2023-11-08 ENCOUNTER — Ambulatory Visit (HOSPITAL_COMMUNITY): Payer: Self-pay | Admitting: Cardiology

## 2023-11-08 ENCOUNTER — Ambulatory Visit (HOSPITAL_COMMUNITY)
Admission: RE | Admit: 2023-11-08 | Discharge: 2023-11-08 | Disposition: A | Discharge status: Home / Self Care | Source: Ambulatory Visit | Attending: Cardiology | Admitting: Cardiology

## 2023-11-08 ENCOUNTER — Other Ambulatory Visit: Payer: Self-pay | Admitting: Podiatry

## 2023-11-08 VITALS — BP 142/78 | HR 68 | Wt 142.4 lb

## 2023-11-08 DIAGNOSIS — I352 Nonrheumatic aortic (valve) stenosis with insufficiency: Secondary | ICD-10-CM | POA: Diagnosis not present

## 2023-11-08 DIAGNOSIS — E785 Hyperlipidemia, unspecified: Secondary | ICD-10-CM | POA: Diagnosis not present

## 2023-11-08 DIAGNOSIS — I351 Nonrheumatic aortic (valve) insufficiency: Secondary | ICD-10-CM | POA: Diagnosis not present

## 2023-11-08 DIAGNOSIS — I10 Essential (primary) hypertension: Secondary | ICD-10-CM | POA: Insufficient documentation

## 2023-11-08 DIAGNOSIS — Z7902 Long term (current) use of antithrombotics/antiplatelets: Secondary | ICD-10-CM | POA: Diagnosis not present

## 2023-11-08 DIAGNOSIS — I252 Old myocardial infarction: Secondary | ICD-10-CM | POA: Diagnosis not present

## 2023-11-08 DIAGNOSIS — Z955 Presence of coronary angioplasty implant and graft: Secondary | ICD-10-CM | POA: Insufficient documentation

## 2023-11-08 DIAGNOSIS — I701 Atherosclerosis of renal artery: Secondary | ICD-10-CM | POA: Diagnosis not present

## 2023-11-08 DIAGNOSIS — R9431 Abnormal electrocardiogram [ECG] [EKG]: Secondary | ICD-10-CM | POA: Diagnosis not present

## 2023-11-08 DIAGNOSIS — I2510 Atherosclerotic heart disease of native coronary artery without angina pectoris: Secondary | ICD-10-CM | POA: Insufficient documentation

## 2023-11-08 LAB — BASIC METABOLIC PANEL WITH GFR
Anion gap: 8 (ref 5–15)
BUN: 17 mg/dL (ref 8–23)
CO2: 26 mmol/L (ref 22–32)
Calcium: 9.1 mg/dL (ref 8.9–10.3)
Chloride: 108 mmol/L (ref 98–111)
Creatinine, Ser: 0.88 mg/dL (ref 0.44–1.00)
GFR, Estimated: >60 mL/min (ref >60)
Glucose, Bld: 102 mg/dL — ABNORMAL HIGH (ref 70–99)
Potassium: 3.7 mmol/L (ref 3.5–5.1)
Sodium: 142 mmol/L (ref 135–145)

## 2023-11-08 LAB — LIPID PANEL
Cholesterol: 160 mg/dL (ref 0–200)
HDL: 49 mg/dL (ref >40)
LDL Cholesterol: 93 mg/dL (ref 0–99)
Total CHOL/HDL Ratio: 3.3 ratio
Triglycerides: 91 mg/dL (ref <150)
VLDL: 18 mg/dL (ref 0–40)

## 2023-11-08 NOTE — Patient Instructions (Signed)
 There has been no changes to your medications.  Labs done today, your results will be available in MyChart, we will contact you for abnormal readings.  Your physician has requested that you have an echocardiogram. Echocardiography is a painless test that uses sound waves to create images of your heart. It provides your doctor with information about the size and shape of your heart and how well your heart's chambers and valves are working. This procedure takes approximately one hour. There are no restrictions for this procedure. Please do NOT wear cologne, perfume, aftershave, or lotions (deodorant is allowed). Please arrive 15 minutes prior to your appointment time.  Please note: We ask at that you not bring children with you during ultrasound (echo/ vascular) testing. Due to room size and safety concerns, children are not allowed in the ultrasound rooms during exams. Our front office staff cannot provide observation of children in our lobby area while testing is being conducted. An adult accompanying a patient to their appointment will only be allowed in the ultrasound room at the discretion of the ultrasound technician under special circumstances. We apologize for any inconvenience.  Your physician recommends that you schedule a follow-up appointment in: 1 year ( May 2026) ** PLEASE CALL THE OFFICE IN FEBRUARY 2026 TO ARRANGE YOUR FOLLOW UP APPOINTMENT.**  If you have any questions or concerns before your next appointment please send us  a message through [place] or call our office at [phone].    TO LEAVE A MESSAGE FOR THE NURSE SELECT OPTION 2, PLEASE LEAVE A MESSAGE INCLUDING: YOUR NAME DATE OF BIRTH CALL BACK NUMBER REASON FOR CALL**this is important as we prioritize the call backs  YOU WILL RECEIVE A CALL BACK THE SAME DAY AS LONG AS YOU CALL BEFORE 4:00 PM  At the [facility], you and your health needs are our priority. As part of our continuing mission to provide  you with exceptional heart care, we have created designated Provider Care Teams. These Care Teams include your primary Cardiologist (physician) and Advanced Practice Providers (APPs- Physician Assistants and Nurse Practitioners) who all work together to provide you with the care you need, when you need it.   You may see any of the following providers on your designated Care Team at your next follow up: Dr Jules Oar Dr Peder Bourdon Dr. Alwin Baars Dr. Arta Lark Amy Marijane Shoulders, NP Ruddy Corral, [place] [facility] [place], [place] Dennise Fitz, NP [place] Lee, NP Shaw[place] Dellen, NP Luster Salters, PharmD Bevely Brush, PharmD   Please be sure to bring in all your medications bottles to every appointment.    Thank you for choosing [facility]

## 2023-11-09 ENCOUNTER — Telehealth: Payer: Self-pay

## 2023-11-09 NOTE — Telephone Encounter (Signed)
 PA request received from [facility] for Terbinafine  HCl 250 mg tablets. PA submitted through covermymeds and waiting on response.  Emily [name]  (Key: [id number])

## 2023-11-09 NOTE — Progress Notes (Signed)
 Date:  11/09/2023   ID:  Lebron Prows, DOB May 15, 1951, MRN [id number]   Provider location: [place] [facility] Type of Visit: Established patient   PCP:  Pcp, No  Cardiologist:  Dr. Mitzie Anda  Chief complaint: CAD, HTN   History of Present Illness: Emily [name] is a 73 y.o. female who has a history of CAD s/p inferior MI in 1/09 treated with PCI to OM3.  She has had no cardiac events since that time. Echo in 9/16 showed EF 55-60% with mild aortic insufficiency.   With elevated BP readings, she was referred to HTN clinic.  Renal artery dopplers in 8/21 were concerning for significant left renal artery stenosis. CTA in 11/21 showed 50% left renal artery stenosis likely from fibromuscular dysplasia.  She saw Dr Katheryne Pane, and it was decided to follow this conservatively.  Repeat renal artery dopplers in 9/22 again showed >60% left renal artery stenosis, but BP is now significantly improved.  Most recent renal artery dopplers in 12/24 also showed >60% left renal artery stenosis.   Echo in 4/23 showed EF 55-60%, normal RV, mild-moderate AS with mean gradient 10 mmHg, mild AI.  Echo in 4/24 showed EF 60-65%, normal RV, mild AI, mild-moderate aortic stenosis.   She presents today for followup of CAD and HTN.  BP now under good control at home (checks twice a day).  She is off Toprol  XL with low HR.  She is playing pickleball three days/week.  No exertional dyspnea or chest pain. Weight is down 11 lbs.  Overall, feeling very good.  No episodes of lightheadedness palpitations.     ECG (personally reviewed): NSR, nonspecific T wave changes    Labs (4/23): LDL 78, TGs 198 Labs (9/23): K 3.6, creatinine 0.78 Labs (12/24): creatinine 0.8 Labs (2/25): LDL 103  PMH: 1. CAD: Inferior MI in 1/09 with culprit vessel a large OM3.  This was treated with PCI.  Had residual 50-70% mLAD stenosis.  Echo (3/14) with EF 60-65%, mild-moderate AI - Echo (9/16): EF 55-60%, inferoseptal  akinesis, mild AI.  - Echo (4/23): EF 55-60%, normal RV, mild-moderate AS with mean gradient 10 mmHg, mild AI.   - Echo (4/24): EF 60-65%, normal RV, mild AI, mild-moderate aortic stenosis.  2. HTN - Renal artery dopplers (8/21): >60% left renal artery stenosis.  - CTA abdomen (11/21): at least 50% left RAS, fibromuscular dysplasia - Renal artery dopplers (9/22): >60% left RA stenosis.  - Renal artery dopplers (12/24): >60% left RAS.  3. H/o fen-phen use 4. Aortic insufficiency: Mild to moderate on echo in 3/14. Mild on 9/16 echo.  5. Carotid dopplers (9/12) with mild plaque.  - Carotid dopplers (9/21): mild BICA stenosis.  - Carotid dopplers (4/25): Mild BICA stenosis 6. Hyperlipidemia 7. GERD 8. Aortic valve disorder: Mild-moderate AS, mild AI on 4/23 echo. Mild to moderate aortic stenosis with mild AI on 4/24 echo.   Current Outpatient Medications  Medication Sig Dispense Refill   Acetylcarnitine HCl (ACETYL L-CARNITINE) 250 MG CAPS Take by mouth. 200 mg every other day     ALPRAZolam  (XANAX ) 0.25 MG tablet Take 1 tablet (0.25 mg total) by mouth 2 (two) times daily as needed for anxiety. (Patient taking differently: Take 0.25 mg by mouth 2 (two) times daily as needed (For HBP if systolic is over 782.).) 180 tablet 1   atorvastatin  (LIPITOR ) 80 MG tablet Take 1 tablet (80 mg total) by mouth daily. NEEDS FOLLOW UP APPOINTMENT FOR MORE REFILLS 90  tablet 0   b complex vitamins tablet Take 1 tablet by mouth daily.     Biotin 300 MCG TABS Take 600 mcg by mouth every other day.     Calcium -Magnesium-Zinc 500-250-12.5 MG TABS Take 1 tablet by mouth every other day.     candesartan  (ATACAND ) 16 MG tablet Take 1 tablet (16 mg total) by mouth in the morning and at bedtime. 180 tablet 3   Cholecalciferol (VITAMIN D) 50 MCG (2000 UT) CAPS Take 2,000 Units by mouth daily.     Chromium Picolinate 200 MCG TABS Take 1 tablet by mouth every other day.     clobetasol ointment (TEMOVATE) 0.05 % as needed.      clopidogrel  (PLAVIX ) 75 MG tablet TAKE 1 TABLET BY MOUTH DAILY 90 tablet 3   Coenzyme Q10 (COQ10) 200 MG CAPS Take 1 capsule by mouth daily.     CRANBERRY JUICE EXTRACT PO Take 500 mg by mouth daily.     EPINEPHrine  0.3 mg/0.3 mL IJ SOAJ injection as needed.  0   famotidine  (PEPCID  AC) 10 MG tablet Take 1 tablet (10 mg total) by mouth 2 (two) times daily. Take  1-2 tablets twice daily 180 tablet 3   finasteride  (PROSCAR ) 5 MG tablet Take 1 tablet (5 mg total) by mouth daily. NEEDS FOLLOW UP APPOINTMENT FOR MORE REFILLS 90 tablet 0   Flaxseed, Linseed, (FLAX SEED OIL) 1000 MG CAPS Take 1,200 mg by mouth daily.     Garlic Oil 500 MG TABS Take 1 tablet by mouth daily.     nitroGLYCERIN  (NITROSTAT ) 0.4 MG SL tablet Place 1 tablet (0.4 mg total) under the tongue every 5 (five) minutes x 3 doses as needed for chest pain. 25 tablet 3   OVER THE COUNTER MEDICATION Take 1,000 mg by mouth in the morning and at bedtime. Beetroot 1000 mg capsules     Probiotic Product (ALIGN PO) Take 1 capsule by mouth daily.     Rhodiola 300 MG CAPS Take 1 capsule by mouth daily.       sucralfate  (CARAFATE ) 1 GM/10ML suspension Take 10 mLs (1 g total) by mouth every 8 (eight) hours as needed. 420 mL 1   triamcinolone (NASACORT) 55 MCG/ACT AERO nasal inhaler      VASCEPA  1 g capsule Take 1 capsule (1 g total) by mouth daily. 90 capsule 3   vitamin C (ASCORBIC ACID) 500 MG tablet Take 1,500 mg by mouth daily.     vitamin E 180 MG (400 UNITS) capsule Take 400 Units by mouth every other day.     spironolactone  (ALDACTONE ) 25 MG tablet Take 1 tablet (25 mg total) by mouth daily. 90 tablet 3   terbinafine  (LAMISIL ) 250 MG tablet TAKE 1 TABLET BY MOUTH ONCE DAILY 90 tablet 0   No current facility-administered medications for this encounter.    Allergies:   Hydralazine , Benzonatate , Chlorpromazine hcl, Ciprofloxacin , Codeine, Doxycycline , Hydrocodone, Hydrocodone-acetaminophen, Lisinopril , Lovaza  [omega-3-acid  ethyl esters  (fish)], Penicillins, Prochlorperazine edisylate, Promethazine hcl, and Sulfonamide derivatives   Social History:  The patient  reports that she quit smoking about 17 years ago. Her smoking use included cigarettes. She started smoking about 37 years ago. She has a 40 pack-year smoking history. She has never used smokeless tobacco. She reports that she does not currently use alcohol. She reports that she does not use drugs.   Family History:  The patient's family history includes Allergy (severe) in her father; Esophageal cancer in her maternal grandfather; Heart disease in her  father; Leukemia in her father; Renal Disease in her paternal grandfather; Skin cancer in her mother.   ROS:  Please see the history of present illness.   All other systems are personally reviewed and negative.   Exam:   BP (!) 142/78   Pulse 68   Wt 64.6 kg (142 lb 6.4 oz)   SpO2 98%   BMI 26.91 kg/m   General: NAD Neck: No JVD, no thyromegaly or thyroid  nodule.  Lungs: Clear to auscultation bilaterally with normal respiratory effort. CV: Nondisplaced PMI.  Heart regular S1/S2, no S3/S4, 3/6 SEM RUSB with some obscuring of S2.  No peripheral edema.  No carotid bruit.  Normal pedal pulses.  Abdomen: Soft, nontender, no hepatosplenomegaly, no distention.  Skin: Intact without lesions or rashes.  Neurologic: Alert and oriented x 3.  Psych: Normal affect. Extremities: No clubbing or cyanosis.  HEENT: Normal.   Wt Readings from Last 3 Encounters:  11/08/23 64.6 kg (142 lb 6.4 oz)  09/11/23 65.8 kg (145 lb)  09/06/23 66.2 kg (146 lb)    ASSESSMENT AND PLAN:  1. CAD: Stable with no ischemic symptoms.  She had PCI in 1/09 in the setting of inferior MI.  She had residual untreated 50-70% mLAD stenosis.  No chest pain.  - Continue statin.  - She has been on Plavix  long-term with no evidence for GI bleeding.   2. Hyperlipidemia: Goal LDL < 55.  LDL has been > goal on atorvastatin  80 mg daily. She cannot tolerate  Crestor  40 mg daily.  She has not wanted to take Zetia .  She is reluctant to get a medication requiring an injection.  - Check lipids today, if LDL > 55, she is willing to go to pharmacy clinic and consider Repatha or Leqvio.  3. Aortic insufficiency/stenosis: echo in 4/24 with mild AI, mild-moderate AS. Murmur sounds somewhat worse on exam.  - Repeat echo this year to follow valvular disease as I think that her murmur has changed.  4. HTN: Possibly related to left renal artery stenosis.  Reasonable control at this time on candesartan  and spironolactone .  - Continue candesartan , spironolatone.  BMET today.  - Has been seen by Dr. Katheryne Pane, plan medical management of RAS for now. - Follows in the pharmacy HTN clinic.     Followup 1 year  I spent 32 minutes reviewing records, interviewing/examining patient, and managing orders.   Signed, Peder Bourdon, MD  11/09/2023  [facility] [address] [facility] [place] [address] [phone] (office) [phone] (fax)

## 2023-11-12 NOTE — Progress Notes (Unsigned)
 11/14/2023 Emily [name] 1951/05/07 [id number]   HPI:  Emily [name] is a 73 y.o. female patient of Dr Katheryne Pane, with a PMH below who presents today for hypertension clinic evaluation.  Back in 2021 she was referred to the advanced hypertension clinic for labile blood pressure readings, and it was noted that stress affected her readings.  In doing secondary workup, renal dopplers showed 1-59% blockage of right renal artery and > 60% in left.  A follow up CT suggested beaded configuration compatible with fibromuscular dysplasia with 50-60% left blockage.  She has been on metoprolol  succ and candesartan  for a few years now and tolerates both without issue.  She felt that her pressure was controlled until she had the shingles vaccine, but again we managed to get her back under control and she stopped checking home pressures (was checking up to 6 times/day).  Over the last few years I have seen her every few months.  She seems to be mostly controlled at this time, although occasional stressful events will spike her readings.    Today she notes that she has been doing well.  Even seeing a few BP readings below 100 systolic.  On those days she usually will skip her afternoon spironolactone .  Checks home BP regularly twice a day, 3 am and 3 pm.  There was a message for us  to also talk about her cholesterol today, as LDL was recently at 93 with MD goal of < 55.     Past Medical History: Carotid stenosis 9/21 1-39% stenosis bilaterally  hyperlipidemia 4/23 LDL 79 on atorvastatin  80  DM2 6/22 A1c 6.3, down from 6.7 (2017)  Fibromuscular dysplasia With 60% stenosis in Left renal artery    Blood Pressure Goal:  130/80  Current Medications: candesartan  16 mg (3 pm, 3 am);  spironolactone  25 qd  Family Hx: father died at 21 - CKD, GI bowel problems, mom at 81 from brain hemorrhage, had enlarged heart 2/2 hypertension (only weighed ~100 lb); 2 children in 30's healthy  Social Hx: no tobacco,  quit in 08; occasional alcohol - maybe once a month at most; no coffee, 6 oz coke in the am; otherwise water.  Hot tea if feeling sick  Diet: mix of home and restaurant; does use some salt (light); salad and meat most nights; peanut butter crackers;  More carbs Fri-Sun  Exercise: pickle ball regularly   Home BP readings: continues to check at least twice daily; last 14 days -   3 AM average - 142/74  HR 59  (range 127-174/69-84)   3 PM average - 115/68  HR 71  (range 87-141/57-76)  Intolerances: ACEI - cough, omega 3 oils - diarrhea in larger doses  Labs: 4/23: Na 137, K 3.7, Glu 137, BUN 12, SCr 0.54, GFR > 60   Wt Readings from Last 3 Encounters:  11/13/23 142 lb (64.4 kg)  11/08/23 142 lb 6.4 oz (64.6 kg)  09/11/23 145 lb (65.8 kg)   BP Readings from Last 3 Encounters:  11/13/23 110/62  11/08/23 (!) 142/78  09/11/23 110/72   Pulse Readings from Last 3 Encounters:  11/13/23 80  11/08/23 68  09/11/23 70    Current Outpatient Medications  Medication Sig Dispense Refill   Acetylcarnitine HCl (ACETYL L-CARNITINE) 250 MG CAPS Take by mouth. 200 mg every other day     ALPRAZolam  (XANAX ) 0.25 MG tablet Take 1 tablet (0.25 mg total) by mouth 2 (two) times daily as needed for anxiety. (Patient taking differently:  Take 0.25 mg by mouth 2 (two) times daily as needed (For HBP if systolic is over 161.).) 180 tablet 1   atorvastatin  (LIPITOR ) 80 MG tablet Take 1 tablet (80 mg total) by mouth daily. NEEDS FOLLOW UP APPOINTMENT FOR MORE REFILLS 90 tablet 0   b complex vitamins tablet Take 1 tablet by mouth daily.     Biotin 300 MCG TABS Take 600 mcg by mouth every other day.     Calcium -Magnesium-Zinc 500-250-12.5 MG TABS Take 1 tablet by mouth every other day.     candesartan  (ATACAND ) 16 MG tablet Take 1 tablet (16 mg total) by mouth in the morning and at bedtime. 180 tablet 3   Cholecalciferol (VITAMIN D) 50 MCG (2000 UT) CAPS Take 2,000 Units by mouth daily.     Chromium Picolinate  200 MCG TABS Take 1 tablet by mouth every other day.     clobetasol ointment (TEMOVATE) 0.05 % as needed.     clopidogrel  (PLAVIX ) 75 MG tablet TAKE 1 TABLET BY MOUTH DAILY 90 tablet 3   Coenzyme Q10 (COQ10) 200 MG CAPS Take 1 capsule by mouth daily.     CRANBERRY JUICE EXTRACT PO Take 500 mg by mouth daily.     EPINEPHrine  0.3 mg/0.3 mL IJ SOAJ injection as needed.  0   famotidine  (PEPCID  AC) 10 MG tablet Take 1 tablet (10 mg total) by mouth 2 (two) times daily. Take  1-2 tablets twice daily 180 tablet 3   finasteride  (PROSCAR ) 5 MG tablet Take 1 tablet (5 mg total) by mouth daily. NEEDS FOLLOW UP APPOINTMENT FOR MORE REFILLS 90 tablet 0   Flaxseed, Linseed, (FLAX SEED OIL) 1000 MG CAPS Take 1,200 mg by mouth daily.     Garlic Oil 500 MG TABS Take 1 tablet by mouth daily.     nitroGLYCERIN  (NITROSTAT ) 0.4 MG SL tablet Place 1 tablet (0.4 mg total) under the tongue every 5 (five) minutes x 3 doses as needed for chest pain. 25 tablet 3   OVER THE COUNTER MEDICATION Take 1,000 mg by mouth in the morning and at bedtime. Beetroot 1000 mg capsules     Probiotic Product (ALIGN PO) Take 1 capsule by mouth daily.     Rhodiola 300 MG CAPS Take 1 capsule by mouth daily.       spironolactone  (ALDACTONE ) 25 MG tablet Take 1 tablet (25 mg total) by mouth daily. 90 tablet 3   sucralfate  (CARAFATE ) 1 GM/10ML suspension Take 10 mLs (1 g total) by mouth every 8 (eight) hours as needed. 420 mL 1   terbinafine  (LAMISIL ) 250 MG tablet TAKE 1 TABLET BY MOUTH ONCE DAILY 90 tablet 0   triamcinolone (NASACORT) 55 MCG/ACT AERO nasal inhaler      VASCEPA  1 g capsule Take 1 capsule (1 g total) by mouth daily. 90 capsule 3   vitamin C (ASCORBIC ACID) 500 MG tablet Take 1,500 mg by mouth daily.     vitamin E 180 MG (400 UNITS) capsule Take 400 Units by mouth every other day.     No current facility-administered medications for this visit.    Allergies  Allergen Reactions   Hydralazine  Other (See Comments)    "Skin  redness and my skin peeled off"   Benzonatate      Unknown, patient states "it made me sick"   Chlorpromazine Hcl     unknown   Ciprofloxacin  Other (See Comments)    Elevate blood pressure readings   Codeine     REACTION: causes  vomiting   Doxycycline      "explosive diarrhea" -does not wish to take this anymore   Hydrocodone     REACTION: causes vomiing and nausea   Hydrocodone-Acetaminophen Nausea And Vomiting   Lisinopril  Cough    Patient states that she will no longer take this medication because it gave her a bad cough    Lovaza  [Omega-3-Acid  Ethyl Esters (Fish)] Other (See Comments)    Loose stools with two capsules- can take 1 capsule with no issues    Penicillins     REACTION: causes rash   Prochlorperazine Edisylate     unknown    Promethazine Hcl     unknown   Sulfonamide Derivatives     unknown     Past Medical History:  Diagnosis Date   Allergy    Anxiety    no anxiety per pt- she wants removed    CAD (coronary artery disease)    Cataract    forming    Chronic insomnia    Colon polyp    Depression    GERD (gastroesophageal reflux disease)    GERD with esophagitis    Heart attack (HCC) 06/2007   Heart murmur    Hyperlipemia    Hypertension    Osteopenia    Perirectal abscess    Shingles 08/31/2017   2nd dx with shingles, per pt- shingles x 4 - severe rxn to shingles vax - last shingles 05-2021    Blood pressure 110/62, pulse 80, weight 142 lb (64.4 kg).  Essential hypertension Assessment: BP is controlled in office BP 110/62 mmHg;   Tolerates candesartan  16 mg (3 pm, 3 am);  spironolactone  25 qd well, without any side effects Denies SOB, palpitation, chest pain, headaches,or swelling Reiterated the importance of regular exercise and low salt diet   Plan:  Stop taking Start taking Continue taking candesartan  16 mg (3 pm, 3 am);  spironolactone  25 every day Can hold spironolactone  for SBP < 100, take 12.5 mg for SBP 100-110 Patient to keep  record of BP readings with heart rate and report to us  at the next visit Patient to follow up with me in 6 months  Labs ordered today:  none   Hyperlipidemia LDL goal <55 Assessment: Patient with ASCVD not at LDL goal of < 55 Most recent LDL 93 on 11/08/23 Has been compliant with high intensity statin/ezetimibe  :  Not able to tolerate statins secondary to myalgias Reviewed options for lowering LDL cholesterol, including ezetimibe , PCSK-9 inhibitors, bempedoic acid and inclisiran.  Discussed mechanisms of action, dosing, side effects, potential decreases in LDL cholesterol and costs.  Also reviewed potential options for patient assistance.  Plan: Patient would like to think on this for now.  Not sure she is ready to add another medication.  Discussed options of Coronary CT or Lp(a) testing to see what further risks are.  Patient agreeable to Lp(a) testing.   Will wait for results before deciding if she would like to pursue further LDL lowering.    Tavon Corriher PharmD CPP [facility] [address] [place], [place] [address] [phone]

## 2023-11-13 ENCOUNTER — Ambulatory Visit: Attending: Cardiology | Admitting: Pharmacist Clinician (PhC)/ Clinical Pharmacy Specialist

## 2023-11-13 VITALS — BP 110/62 | HR 80 | Wt 142.0 lb

## 2023-11-13 DIAGNOSIS — I10 Essential (primary) hypertension: Secondary | ICD-10-CM

## 2023-11-13 DIAGNOSIS — E785 Hyperlipidemia, unspecified: Secondary | ICD-10-CM | POA: Diagnosis not present

## 2023-11-13 NOTE — Patient Instructions (Signed)
 Follow up appointment: in 6 months - I'll reach out in 3 months to schedule  Take your BP meds as follows:  If systolic is < 100, hold spironolactone   If systolic is between 100-110, take 1/2 tablet of spironolactone   Check your blood pressure at home daily (if able) and keep record of the readings.  Your blood pressure goal is < 130/80  To check your pressure at home you will need to:  1. Sit up in a chair, with feet flat on the floor and back supported. Do not cross your ankles or legs. 2. Rest your left arm so that the cuff is about heart level. If the cuff goes on your upper arm,  then just relax the arm on the table, arm of the chair or your lap. If you have a wrist cuff, we  suggest relaxing your wrist against your chest (think of it as Pledging the Flag with the  wrong arm).  3. Place the cuff snugly around your arm, about 1 inch above the crook of your elbow. The  cords should be inside the groove of your elbow.  4. Sit quietly, with the cuff in place, for about 5 minutes. After that 5 minutes press the power  button to start a reading. 5. Do not talk or move while the reading is taking place.  6. Record your readings on a sheet of paper. Although most cuffs have a memory, it is often  easier to see a pattern developing when the numbers are all in front of you.  7. You can repeat the reading after 1-3 minutes if it is recommended  Make sure your bladder is empty and you have not had caffeine or tobacco within the last 30 min  Always bring your blood pressure log with you to your appointments. If you have not brought your monitor in to be double checked for accuracy, please bring it to your next appointment.  You can find a list of quality blood pressure cuffs at [email or web address]  Important lifestyle changes to control high blood pressure  Intervention  Effect on the BP  Lose extra pounds and watch your waistline Weight loss is one of the most effective lifestyle changes for  controlling blood pressure. If you're overweight or obese, losing even a small amount of weight can help reduce blood pressure. Blood pressure might go down by about 1 millimeter of mercury (mm Hg) with each kilogram (about 2.2 pounds) of weight lost.  Exercise regularly As a general goal, aim for at least 30 minutes of moderate physical activity every day. Regular physical activity can lower high blood pressure by about 5 to 8 mm Hg.  Eat a healthy diet Eating a diet rich in whole grains, fruits, vegetables, and low-fat dairy products and low in saturated fat and cholesterol. A healthy diet can lower high blood pressure by up to 11 mm Hg.  Reduce salt (sodium) in your diet Even a small reduction of sodium in the diet can improve heart health and reduce high blood pressure by about 5 to 6 mm Hg.  Limit alcohol One drink equals 12 ounces of beer, 5 ounces of wine, or 1.5 ounces of 80-proof liquor.  Limiting alcohol to less than one drink a day for women or two drinks a day for men can help lower blood pressure by about 4 mm Hg.   If you have any questions or concerns please use My Chart to send questions or call the office at [phone]

## 2023-11-14 ENCOUNTER — Other Ambulatory Visit (HOSPITAL_COMMUNITY): Payer: Self-pay | Admitting: Cardiology

## 2023-11-14 ENCOUNTER — Encounter: Payer: Self-pay | Admitting: Pharmacist Clinician (PhC)/ Clinical Pharmacy Specialist

## 2023-11-14 NOTE — Assessment & Plan Note (Signed)
 Assessment: BP is controlled in office BP 110/62 mmHg;   Tolerates candesartan  16 mg (3 pm, 3 am);  spironolactone  25 qd well, without any side effects Denies SOB, palpitation, chest pain, headaches,or swelling Reiterated the importance of regular exercise and low salt diet   Plan:  Stop taking Start taking Continue taking candesartan  16 mg (3 pm, 3 am);  spironolactone  25 every day Can hold spironolactone  for SBP < 100, take 12.5 mg for SBP 100-110 Patient to keep record of BP readings with heart rate and report to us  at the next visit Patient to follow up with me in 6 months  Labs ordered today:  none

## 2023-11-14 NOTE — Assessment & Plan Note (Signed)
 Assessment: Patient with ASCVD not at LDL goal of < 55 Most recent LDL 93 on 11/08/23 Has been compliant with high intensity statin/ezetimibe  :  Not able to tolerate statins secondary to myalgias Reviewed options for lowering LDL cholesterol, including ezetimibe , PCSK-9 inhibitors, bempedoic acid and inclisiran.  Discussed mechanisms of action, dosing, side effects, potential decreases in LDL cholesterol and costs.  Also reviewed potential options for patient assistance.  Plan: Patient would like to think on this for now.  Not sure she is ready to add another medication.  Discussed options of Coronary CT or Lp(a) testing to see what further risks are.  Patient agreeable to Lp(a) testing.   Will wait for results before deciding if she would like to pursue further LDL lowering.

## 2023-11-15 NOTE — Telephone Encounter (Signed)
 Terbinafine  PA was denied due to quantity limitations. Plan only allows for 84 within 90 days. Request for redetermination submitted for 84 tablets and waiting on response.

## 2023-11-16 NOTE — Telephone Encounter (Signed)
 PA was approved 06/20/2023-06/18/2024

## 2023-12-27 ENCOUNTER — Ambulatory Visit (HOSPITAL_COMMUNITY)
Admission: RE | Admit: 2023-12-27 | Discharge: 2023-12-27 | Disposition: A | Discharge status: Home / Self Care | Source: Ambulatory Visit | Attending: Cardiology | Admitting: Cardiology

## 2023-12-27 DIAGNOSIS — I352 Nonrheumatic aortic (valve) stenosis with insufficiency: Secondary | ICD-10-CM | POA: Diagnosis not present

## 2023-12-27 DIAGNOSIS — I351 Nonrheumatic aortic (valve) insufficiency: Secondary | ICD-10-CM | POA: Diagnosis present

## 2023-12-27 DIAGNOSIS — I517 Cardiomegaly: Secondary | ICD-10-CM | POA: Insufficient documentation

## 2023-12-27 DIAGNOSIS — I350 Nonrheumatic aortic (valve) stenosis: Secondary | ICD-10-CM | POA: Diagnosis not present

## 2023-12-27 LAB — ECHOCARDIOGRAM COMPLETE
AR max vel: 1.36 cm2
AV Area VTI: 1.4 cm2
AV Area mean vel: 1.37 cm2
AV Mean grad: 16.4 mmHg
AV Peak grad: 29.3 mmHg
Ao pk vel: 2.71 m/s
Area-P 1/2: 2.16 cm2
Calc EF: 68 %
S' Lateral: 2.5 cm
Single Plane A2C EF: 66.8 %
Single Plane A4C EF: 67 %

## 2023-12-31 NOTE — Telephone Encounter (Signed)
 Called patients' husband with following echo results per Dr. Rolan:  LV EF normal with mild-moderate AS.  Husband verbalized understanding of same and will share with patient. Asked them to call [phone] if any questions.

## 2024-01-15 ENCOUNTER — Telehealth: Payer: Self-pay | Admitting: Pharmacist Clinician (PhC)/ Clinical Pharmacy Specialist

## 2024-01-15 MED ORDER — SPIRONOLACTONE 25 MG PO TABS: ORAL_TABLET | ORAL | 3 refills | Status: AC

## 2024-01-15 NOTE — Telephone Encounter (Signed)
 Pt c/o medication issue:  1. Name of Medication:   spironolactone  (ALDACTONE ) 25 MG tablet (Expired)    2. How are you currently taking this medication (dosage and times per day)? Not sure  3. Are you having a reaction (difficulty breathing--STAT)? No   4. What is your medication issue? Katheryn from [facility] is calling to verify dosage of this medication. Pt is telling her she is taking it different than written.Please advise

## 2024-01-15 NOTE — Telephone Encounter (Signed)
 Spoke with Kristin Alvstad RPH-CPP and confirmed patient's Spironolactone  order was changed per 08/10/23 note:   Alvstad, Kristin L, RPH-CPP to Emily [name]     08/10/23  4:56 PM Hi   I was reviewing your readings, thanks for sending them in.     First, your heart rate wants to hang out fairly low.  The average was 50, but would like to see it a little higher overall.  Stop the metoprolol .  It has very little impact on BP, and you're on a low dose anyway.     Second, your systolic has been a bit higher for the past few days, and I see that you took a Xanax  this morning (or last night depending on perspective!).  If the systolic stays > 849 for another 2-3 days, then increase the spironolactone  to 1 tablet at 3 pm and 1/2 tablet at 3 am.   It may take a couple of days for the number to come down, but it's not at a dangerous level.  I know that 150's make you nervous, but to me 150-160's mean we just need to make some changes.       I'll be unavailable until March 4, but if you have questions or concerns please feel free to reach out to one of my partners, Robbi Blanch.  She's watching my messages for now.     Kristin  Last read by Khamia Cheatum [name] at 4:43PM on 09/05/2023.

## 2024-01-15 NOTE — Telephone Encounter (Signed)
 Called and spoke to [place] at [facility]. Katheryn states that patient requested refill for Spironolactone  prescription. States pharmacy shows that patient should have one week of spironolactone  supply. Katheryn states that patient reported being out of Spironolactone  because she has been taking 1.5 tablets daily. States patient told her that someone told her she could take 1.5 tablets daily. Attempted to contact patient to clarify who advised to take 1.5 tablets of Spironolactone  and to clarify length of time she has been taking 1.5 tablets of Spironolactone  daily. Left message on patient's personal voicemail to call back.

## 2024-01-15 NOTE — Telephone Encounter (Signed)
 Call spoke Tillman , pharmacist-- RN informed  a electronic prescription for Spironolactone   was sent.  Tillman states she see the new order .

## 2024-02-04 ENCOUNTER — Other Ambulatory Visit (HOSPITAL_COMMUNITY): Payer: Self-pay | Admitting: Cardiology

## 2024-02-04 ENCOUNTER — Other Ambulatory Visit: Payer: Self-pay | Admitting: Cardiovascular Disease

## 2024-02-26 ENCOUNTER — Telehealth: Payer: Self-pay | Admitting: Cardiovascular Disease

## 2024-02-26 NOTE — Telephone Encounter (Signed)
 Patient reports low BP readings around 3 pm, with systolic often < 899.  Currently taking candesartan  16 mg bid (3 am and 3 pm) and spironolactone  12-25 mg at 3 pm depending on BP.  Asked patient to cut 3 am candesartan  from 16 mg to 8 mg and report back with results in 2 weeks.    Patient voiced understanding.

## 2024-02-26 NOTE — Telephone Encounter (Signed)
 Pt c/o medication issue:  1. Name of Medication: candesartan  (ATACAND ) 16 MG tablet   2. How are you currently taking this medication (dosage and times per day)?   3. Are you having a reaction (difficulty breathing--STAT)? no  4. What is your medication issue? Patient states that she is still getting low pressure while on this medication. Calling to see what she can do or if she needs change when she take this medication. Please advise

## 2024-03-04 ENCOUNTER — Other Ambulatory Visit: Payer: Self-pay | Admitting: Podiatry

## 2024-04-06 ENCOUNTER — Other Ambulatory Visit: Payer: Self-pay | Admitting: Internal Medicine

## 2024-05-01 ENCOUNTER — Other Ambulatory Visit (HOSPITAL_COMMUNITY): Payer: Self-pay | Admitting: Cardiology

## 2024-05-07 ENCOUNTER — Telehealth: Payer: Self-pay | Admitting: Pharmacist Clinician (PhC)/ Clinical Pharmacy Specialist

## 2024-05-07 ENCOUNTER — Ambulatory Visit: Admitting: Podiatry

## 2024-05-07 DIAGNOSIS — I10 Essential (primary) hypertension: Secondary | ICD-10-CM

## 2024-05-07 MED ORDER — CANDESARTAN CILEXETIL 16 MG PO TABS: 16.0000 mg | ORAL_TABLET | Freq: Two times a day (BID) | ORAL | 1 refills | Status: DC

## 2024-05-07 NOTE — Telephone Encounter (Signed)
*  STAT* If patient is at the pharmacy, call can be transferred to refill team.   1. Which medications need to be refilled? (please list name of each medication and dose if known) candesartan  (ATACAND ) 16 MG tablet    2. Would you like to learn more about the convenience, safety, & potential cost savings by using the [facility]? No    3. Are you open to using the [facility] ([facility]. No    4. Which pharmacy/location (including street and city if local pharmacy) is medication to be sent to?[facility] - [place], [place] - [address]    5. Do they need a 30 day or 90 day supply? 90 day

## 2024-05-07 NOTE — Telephone Encounter (Signed)
 Pt c/o medication issue:  1. Name of Medication: candesartan  (ATACAND ) 16 MG tablet   2. How are you currently taking this medication (dosage and times per day)? As written   3. Are you having a reaction (difficulty breathing--STAT)? no  4. What is your medication issue? Pt called in stating  she would like this med changed to 8mg  instead. Please advise.

## 2024-05-08 MED ORDER — CANDESARTAN CILEXETIL 16 MG PO TABS: ORAL_TABLET | ORAL | 3 refills | Status: AC

## 2024-05-08 MED ORDER — CANDESARTAN CILEXETIL 8 MG PO TABS: ORAL_TABLET | ORAL | 1 refills | Status: AC

## 2024-05-08 NOTE — Telephone Encounter (Signed)
 Patient is requesting a new prescription for Candesartan  8 mg as she does not want to cut the Candesartan  16 in half.  Patient noted she takes the Candesartan  16 mg once a day in the morning and the Candasartan 8 mg at night.

## 2024-05-08 NOTE — Telephone Encounter (Signed)
 Patient having difficulty with cutting 16 mg tabs in half.  Would like rx for 8 mg tabs and separate for 16 mg.    Rx sent to pharmacy with correct doses.

## 2024-05-08 NOTE — Telephone Encounter (Signed)
 Call to find out why patient is requesting lower dose candesartan  and how is her BP trending. N/A LVM

## 2024-05-08 NOTE — Addendum Note (Signed)
 Addended by: Vista Sawatzky L on: 05/08/2024 05:14 PM   Modules accepted: Orders

## 2024-05-19 ENCOUNTER — Ambulatory Visit (HOSPITAL_COMMUNITY): Admission: RE | Admit: 2024-05-19 | Source: Ambulatory Visit

## 2024-05-28 ENCOUNTER — Encounter: Payer: Self-pay | Admitting: Podiatry

## 2024-05-28 ENCOUNTER — Ambulatory Visit: Admitting: Podiatry

## 2024-05-28 VITALS — Ht 61.0 in | Wt 142.0 lb

## 2024-05-28 DIAGNOSIS — B351 Tinea unguium: Secondary | ICD-10-CM | POA: Diagnosis not present

## 2024-05-28 NOTE — Progress Notes (Signed)
 Chief Complaint  Patient presents with   Nail Problem    Pt is here to f/u on bilateral toenail fungus. She states that she is still taking medication for fungus and its working.    Subjective: 73 y.o. female presenting today with her husband for follow-up evaluation of thick discoloration to the toenails bilateral.  Overall significant improvement.  Doing well.  No new complaints Brief history: Onset for several years.  Patient states that she has tried topical antifungal medication with no improvement.  About 20+ years ago she also tried oral antifungal medication which created nausea and vomiting.  Past Medical History:  Diagnosis Date   Allergy    Anxiety    no anxiety per pt- she wants removed    CAD (coronary artery disease)    Cataract    forming    Chronic insomnia    Colon polyp    Depression    GERD (gastroesophageal reflux disease)    GERD with esophagitis    Heart attack (HCC) 06/2007   Heart murmur    Hyperlipemia    Hypertension    Osteopenia    Perirectal abscess    Shingles 08/31/2017   2nd dx with shingles, per pt- shingles x 4 - severe rxn to shingles vax - last shingles 05-2021    Past Surgical History:  Procedure Laterality Date   CESAREAN SECTION     x 2   COLONOSCOPY     COLONOSCOPY W/ POLYPECTOMY  2007   INCISION AND DRAINAGE ABSCESS ANAL     POLYPECTOMY     PTCA with stent to third obtuse marginal     WISDOM TOOTH EXTRACTION      Allergies  Allergen Reactions   Hydralazine  Other (See Comments)    Skin redness and my skin peeled off   Benzonatate      Unknown, patient states it made me sick   Chlorpromazine Hcl     unknown   Ciprofloxacin  Other (See Comments)    Elevate blood pressure readings   Codeine     REACTION: causes vomiting   Doxycycline      explosive diarrhea -does not wish to take this anymore   Hydrocodone     REACTION: causes vomiing and nausea   Hydrocodone-Acetaminophen Nausea And Vomiting   Lisinopril  Cough     Patient states that she will no longer take this medication because it gave her a bad cough    Lovaza  [Omega-3-Acid  Ethyl Esters (Fish)] Other (See Comments)    Loose stools with two capsules- can take 1 capsule with no issues    Penicillins     REACTION: causes rash   Prochlorperazine Edisylate     unknown    Promethazine Hcl     unknown   Sulfonamide Derivatives     unknown     B/L feet 05/09/2023  Objective: Physical Exam General: The patient is alert and oriented x3 in no acute distress.  Dermatology: Continued improvement of the appearance of the nails.  The health of the nail significantly improved and there has been healthy nail regrowth  Vascular: Palpable pedal pulses bilaterally. No edema or erythema noted. Capillary refill within normal limits.  Neurological: Grossly intact via light touch  Musculoskeletal Exam: No pedal deformity noted.  Postmenopausal lipoma noted to the lateral aspect of the right anterior ankle  Assessment: #1 Onychomycosis of toenails bilateral; improved #2 lipoma right anterior lateral ankle; asymptomatic  Plan of Care:  -Patient was evaluated. - Discontinue Lamisil  once her  last prescription is completed in the next few weeks.  She has gone through 3 rounds of 90-day supply of Lamisil  250mg  -Continue to maintain good foot hygiene -Return to clinic PRN   Thresa EMERSON Sar, DPM [facility]  Dr. Thresa EMERSON Sar, DPM    [address] [place], [place] [address]                Office [phone]  Fax [phone]
